# Patient Record
Sex: Female | Born: 1940
Health system: Southern US, Community
[De-identification: ages and names within clinical notes are randomized; demographics above are authoritative.]

## PROBLEM LIST (undated history)

## (undated) DIAGNOSIS — K5790 Diverticulosis of intestine, part unspecified, without perforation or abscess without bleeding: Secondary | ICD-10-CM

## (undated) DIAGNOSIS — K219 Gastro-esophageal reflux disease without esophagitis: Secondary | ICD-10-CM

## (undated) DIAGNOSIS — K120 Recurrent oral aphthae: Secondary | ICD-10-CM

## (undated) DIAGNOSIS — R413 Other amnesia: Secondary | ICD-10-CM

## (undated) DIAGNOSIS — N9489 Other specified conditions associated with female genital organs and menstrual cycle: Secondary | ICD-10-CM

## (undated) DIAGNOSIS — I4891 Unspecified atrial fibrillation: Secondary | ICD-10-CM

## (undated) DIAGNOSIS — R569 Unspecified convulsions: Secondary | ICD-10-CM

## (undated) HISTORY — PX: HERNIA REPAIR: SHX51

## (undated) HISTORY — PX: OTHER SURGICAL HISTORY: SHX169

## (undated) HISTORY — DX: Gastro-esophageal reflux disease without esophagitis: K21.9

## (undated) HISTORY — PX: CHOLECYSTECTOMY: SHX55

---

## 1998-05-12 ENCOUNTER — Other Ambulatory Visit: Admission: RE | Admit: 1998-05-12 | Discharge: 1998-05-12 | Payer: Self-pay | Admitting: *Deleted

## 2002-12-17 HISTORY — PX: HERNIA REPAIR: SHX51

## 2005-04-27 ENCOUNTER — Ambulatory Visit: Payer: Self-pay

## 2007-05-06 ENCOUNTER — Ambulatory Visit: Payer: Self-pay | Admitting: Family Medicine

## 2007-05-08 ENCOUNTER — Ambulatory Visit: Payer: Self-pay | Admitting: Gastroenterology

## 2008-06-05 ENCOUNTER — Encounter: Admission: RE | Admit: 2008-06-05 | Discharge: 2008-06-05 | Payer: Self-pay | Admitting: Neurology

## 2008-07-01 ENCOUNTER — Ambulatory Visit: Payer: Self-pay | Admitting: Family Medicine

## 2008-07-02 LAB — HM DEXA SCAN

## 2008-09-21 DIAGNOSIS — N9089 Other specified noninflammatory disorders of vulva and perineum: Secondary | ICD-10-CM | POA: Insufficient documentation

## 2009-06-08 ENCOUNTER — Ambulatory Visit: Payer: Self-pay | Admitting: Family Medicine

## 2010-01-04 ENCOUNTER — Ambulatory Visit: Payer: Self-pay

## 2010-08-16 ENCOUNTER — Ambulatory Visit: Payer: Self-pay | Admitting: Unknown Physician Specialty

## 2010-08-18 LAB — PATHOLOGY REPORT

## 2010-08-25 ENCOUNTER — Ambulatory Visit: Payer: Self-pay | Admitting: Unknown Physician Specialty

## 2012-07-22 ENCOUNTER — Ambulatory Visit: Payer: Self-pay | Admitting: Unknown Physician Specialty

## 2012-07-22 LAB — HM COLONOSCOPY

## 2012-07-25 ENCOUNTER — Ambulatory Visit: Payer: Self-pay | Admitting: Family Medicine

## 2012-07-30 ENCOUNTER — Ambulatory Visit: Payer: Self-pay | Admitting: Family Medicine

## 2013-02-13 ENCOUNTER — Ambulatory Visit: Payer: Self-pay | Admitting: Family Medicine

## 2014-07-25 LAB — HM PAP SMEAR

## 2014-10-28 ENCOUNTER — Ambulatory Visit: Payer: Self-pay | Admitting: Family Medicine

## 2015-03-16 LAB — BASIC METABOLIC PANEL
BUN: 14 mg/dL (ref 4–21)
Creatinine: 0.8 mg/dL (ref 0.5–1.1)
Glucose: 93 mg/dL
Potassium: 4.2 mmol/L (ref 3.4–5.3)
SODIUM: 141 mmol/L (ref 137–147)

## 2015-03-16 LAB — CBC AND DIFFERENTIAL
HEMATOCRIT: 46 % (ref 36–46)
Hemoglobin: 15.4 g/dL (ref 12.0–16.0)
Platelets: 229 10*3/uL (ref 150–399)
WBC: 5.2 10*3/mL

## 2015-03-16 LAB — HEPATIC FUNCTION PANEL
ALT: 20 U/L (ref 7–35)
AST: 17 U/L (ref 13–35)

## 2015-03-16 LAB — TSH: TSH: 2.42 u[IU]/mL (ref 0.41–5.90)

## 2015-03-17 ENCOUNTER — Ambulatory Visit
Admit: 2015-03-17 | Disposition: A | Discharge status: Home / Self Care | Payer: Self-pay | Attending: Family Medicine | Admitting: Family Medicine

## 2015-05-19 ENCOUNTER — Other Ambulatory Visit: Payer: Self-pay | Admitting: Family Medicine

## 2015-05-19 ENCOUNTER — Telehealth: Payer: Self-pay | Admitting: Family Medicine

## 2015-05-19 DIAGNOSIS — R239 Unspecified skin changes: Secondary | ICD-10-CM

## 2015-05-19 NOTE — Telephone Encounter (Signed)
Pt would like to get a referral to Derm. Pt has already set up appt for 05/20/15 @ 1pm with Dr. Maude Leriche on [facility]. Pt has places on her back that are crusty and itchy. Pt stated she is very concerned. Pt would like a call back for an update as soon as possible.

## 2015-05-23 NOTE — Progress Notes (Signed)
Sent to me in in error.

## 2015-05-28 DIAGNOSIS — K120 Recurrent oral aphthae: Secondary | ICD-10-CM | POA: Insufficient documentation

## 2015-05-28 DIAGNOSIS — R5383 Other fatigue: Secondary | ICD-10-CM | POA: Insufficient documentation

## 2015-05-28 DIAGNOSIS — R413 Other amnesia: Secondary | ICD-10-CM | POA: Insufficient documentation

## 2015-05-28 DIAGNOSIS — K5790 Diverticulosis of intestine, part unspecified, without perforation or abscess without bleeding: Secondary | ICD-10-CM | POA: Insufficient documentation

## 2015-05-28 DIAGNOSIS — K4090 Unilateral inguinal hernia, without obstruction or gangrene, not specified as recurrent: Secondary | ICD-10-CM | POA: Insufficient documentation

## 2015-05-28 DIAGNOSIS — N952 Postmenopausal atrophic vaginitis: Secondary | ICD-10-CM | POA: Insufficient documentation

## 2015-05-28 DIAGNOSIS — N9489 Other specified conditions associated with female genital organs and menstrual cycle: Secondary | ICD-10-CM | POA: Insufficient documentation

## 2015-05-31 ENCOUNTER — Other Ambulatory Visit: Payer: Self-pay

## 2015-05-31 DIAGNOSIS — G4700 Insomnia, unspecified: Secondary | ICD-10-CM

## 2015-05-31 MED ORDER — ZOLPIDEM TARTRATE 5 MG PO TABS: 5.0000 mg | ORAL_TABLET | Freq: Every day | ORAL | Status: DC

## 2015-05-31 NOTE — Telephone Encounter (Signed)
Printed. Please fax. Thanks.  

## 2015-08-08 ENCOUNTER — Encounter: Payer: Self-pay | Admitting: Family Medicine

## 2015-11-18 ENCOUNTER — Ambulatory Visit (INDEPENDENT_AMBULATORY_CARE_PROVIDER_SITE_OTHER): Payer: Commercial Managed Care - HMO | Admitting: Family Medicine

## 2015-11-18 ENCOUNTER — Encounter: Payer: Self-pay | Admitting: Family Medicine

## 2015-11-18 VITALS — BP 130/80 | HR 53 | Temp 98.8°F | Resp 165 | Wt 121.0 lb

## 2015-11-18 DIAGNOSIS — N3000 Acute cystitis without hematuria: Secondary | ICD-10-CM | POA: Diagnosis not present

## 2015-11-18 DIAGNOSIS — R300 Dysuria: Secondary | ICD-10-CM | POA: Diagnosis not present

## 2015-11-18 LAB — POCT URINALYSIS DIPSTICK
BILIRUBIN UA: NEGATIVE
Blood, UA: NEGATIVE
GLUCOSE UA: NEGATIVE
KETONES UA: NEGATIVE
Nitrite, UA: NEGATIVE
Protein, UA: NEGATIVE
SPEC GRAV UA: 1.02
Urobilinogen, UA: 0.2
pH, UA: 6

## 2015-11-18 MED ORDER — CIPROFLOXACIN HCL 500 MG PO TABS
500.0000 mg | ORAL_TABLET | Freq: Two times a day (BID) | ORAL | Status: DC
Start: 2015-11-18 — End: 2015-11-24

## 2015-11-18 NOTE — Progress Notes (Signed)
Patient: Alexis Lyons Female    DOB: Feb 18, 1941   74 y.o.   MRN: [id number] Visit Date: 11/18/2015  Today's Provider: Lelon Huh, MD   Chief Complaint  Patient presents with  . Dysuria    Burning   Subjective:    HPI  Patient started having stomach cramps 11/16/2015 in the evening with diarrhea and nausea. Then on 11/17/2015 she began having burning sensation when she urinated. Diarrhea and nausea have subsided. Burning is improved today, but not resolved. No fevers, no flank pain.     Allergies  Allergen Reactions  . Codeine    Previous Medications   DIPHENOXYLATE-ATROPINE (LOMOTIL) 2.5-0.025 MG PER TABLET    Take by mouth.   GINSENG 100 MG CAPS       MELOXICAM (MOBIC) 15 MG TABLET    Take by mouth.   OFLOXACIN (OCUFLOX) 0.3 % OPHTHALMIC SOLUTION    INSTILL 1 DROP INTO BOTH EYES TWICE A DAY FOR 7 DAYS AS NEEDED   OMEPRAZOLE (PRILOSEC) 40 MG CAPSULE    Take by mouth.   PANTOPRAZOLE (PROTONIX) 40 MG TABLET    Take by mouth.   ZOLPIDEM (AMBIEN) 5 MG TABLET    Take 1 tablet (5 mg total) by mouth at bedtime.    Review of Systems  Constitutional: Negative for fever, chills, appetite change and fatigue.  Respiratory: Negative for chest tightness and shortness of breath.   Cardiovascular: Negative for chest pain and palpitations.  Gastrointestinal: Negative for nausea, vomiting and abdominal pain.  Genitourinary:       Burning sensation when she urinates, started yesterday 11/17/2015  Neurological: Negative for dizziness and weakness.    Social History  Substance Use Topics  . Smoking status: Former Research scientist (life sciences)  . Smokeless tobacco: Not on file  . Alcohol Use: 0.0 oz/week    0 Standard drinks or equivalent per week   Objective:   BP 130/80 mmHg  Pulse 53  Temp(Src) 98.8 F (37.1 C) (Oral)  Resp 165  Wt 121 lb (54.885 kg)  SpO2 97%  Physical Exam   General Appearance:    Alert, cooperative, no distress  Eyes:    PERRL, conjunctiva/corneas clear, EOM's  intact       Lungs:     Clear to auscultation bilaterally, respirations unlabored  Heart:    Regular rate and rhythm  Neurologic:   Awake, alert, oriented x 3. No apparent focal neurological           defect.   GU:     No CVAT       Results for orders placed or performed in visit on 11/18/15  POCT urinalysis dipstick  Result Value Ref Range   Color, UA Yellow    Clarity, UA Clear    Glucose, UA Neg    Bilirubin, UA Neg    Ketones, UA Neg    Spec Grav, UA 1.020    Blood, UA Neg    pH, UA 6.0    Protein, UA Neg    Urobilinogen, UA 0.2    Nitrite, UA Neg    Leukocytes, UA Trace (A) Negative       Assessment & Plan:     1. Dysuria  - POCT urinalysis dipstick  2. Acute cystitis without hematuria Improving today. Counseled to push clear liquids. Start abx if symptoms worse or not completley resolved within 7 days.  - ciprofloxacin (CIPRO) 500 MG tablet; Take 1 tablet (500 mg total) by mouth 2 (two)  times daily.  Dispense: 14 tablet; Refill: 0       Lelon Huh, MD  [facility]

## 2015-11-24 ENCOUNTER — Ambulatory Visit (INDEPENDENT_AMBULATORY_CARE_PROVIDER_SITE_OTHER): Payer: Commercial Managed Care - HMO | Admitting: Family Medicine

## 2015-11-24 ENCOUNTER — Encounter: Payer: Self-pay | Admitting: Family Medicine

## 2015-11-24 VITALS — BP 120/82 | HR 60 | Temp 98.3°F | Resp 16 | Ht 63.5 in | Wt 121.0 lb

## 2015-11-24 DIAGNOSIS — Z23 Encounter for immunization: Secondary | ICD-10-CM | POA: Diagnosis not present

## 2015-11-24 DIAGNOSIS — Z1231 Encounter for screening mammogram for malignant neoplasm of breast: Secondary | ICD-10-CM

## 2015-11-24 DIAGNOSIS — Z0000 Encounter for general adult medical examination without abnormal findings: Secondary | ICD-10-CM | POA: Diagnosis not present

## 2015-11-24 DIAGNOSIS — N907 Vulvar cyst: Secondary | ICD-10-CM | POA: Diagnosis not present

## 2015-11-24 NOTE — Progress Notes (Signed)
Patient: Alexis Lyons, Female    DOB: 09-20-1941, 74 y.o.   MRN: [id number] Visit Date: 11/24/2015  Today's Provider: Margarita Rana, MD   Chief Complaint  Patient presents with  . Medicare Wellness  . Mass   Subjective:    Annual wellness visit Alexis Lyons is a 74 y.o. female. She feels well. She reports exercising daily; runs 3 miles. She reports she is sleeping well.  Last AWV- 08/04/2014 Last Pap- 08/04/2014 Last Mammo- 10/28/2014- BI-RADS 1 Last Colon- 07/22/2012- Diverticulosis. Recheck 5 years per Dr. Vira Agar Pneumovax 23- 01/01/2011  Labs checked at last AWV, as well as 03/16/2015. All labs were WNL.  -----------------------------------------------------------  Mass Located on groin area x 1 year. Pt denies pain, itching, change in size.   Review of Systems  Constitutional: Negative.   HENT: Negative.   Eyes: Negative.   Respiratory: Negative.   Cardiovascular: Negative.   Gastrointestinal: Negative.   Endocrine: Negative.   Genitourinary: Negative.   Musculoskeletal: Negative.   Skin: Negative for color change, pallor, rash and wound.       Mass is present  Allergic/Immunologic: Negative.   Neurological: Negative.   Hematological: Negative.   Psychiatric/Behavioral: Negative.     Social History   Social History  . Marital Status: Divorced    Spouse Name: N/A  . Number of Children: 2  . Years of Education: 15   Occupational History  . [employer] in [place] History Main Topics  . Smoking status: Former Smoker    Quit date: 12/16/1985  . Smokeless tobacco: Never Used  . Alcohol Use: 4.2 oz/week    0 Standard drinks or equivalent, 7 Glasses of wine per week     Comment: glass of wine with dinner  . Drug Use: No  . Sexual Activity: Not on file   Other Topics Concern  . Not on file   Social History Narrative    Patient Active Problem List   Diagnosis Date Noted  . Aphthae 05/28/2015  . DD (diverticular disease) 05/28/2015   . Fatigue 05/28/2015  . Hernia, inguinal, right 05/28/2015  . Amnesia 05/28/2015  . 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 Atrophy of vagina 05/28/2015  . Vaginal lesion 09/21/2008  . Acid reflux 06/15/2008  . Cannot sleep 12/17/2004    Past Surgical History  Procedure Laterality Date  . Hernia repair Right 2004    inguinal   . Cholecystectomy    . Hernia repair      x's 3    Her family history includes Alzheimer's disease in her mother; Cancer in her sister.    Previous Medications   GINSENG 100 MG CAPS       MELOXICAM (MOBIC) 15 MG TABLET    Take by mouth.   OFLOXACIN (OCUFLOX) 0.3 % OPHTHALMIC SOLUTION    INSTILL 1 DROP INTO BOTH EYES TWICE A DAY FOR 7 DAYS AS NEEDED   OMEPRAZOLE (PRILOSEC) 40 MG CAPSULE    Take by mouth.   PANTOPRAZOLE (PROTONIX) 40 MG TABLET    Take by mouth.   ZOLPIDEM (AMBIEN) 5 MG TABLET    Take 1 tablet (5 mg total) by mouth at bedtime.    Patient Care Team: Margarita Rana, MD as PCP - General (Family Medicine)     Objective:   Vitals: BP 120/82 mmHg  Pulse 60  Temp(Src) 98.3 F (36.8 C) (Oral)  Resp 16  Ht 5' 3.5" (1.613 m)  Wt 121 lb (54.885 kg)  BMI 21.10 kg/m2  Physical Exam  Constitutional: She is oriented to person, place, and time. She appears well-developed and well-nourished. No distress.  HENT:  Head: Normocephalic and atraumatic.  Right Ear: External ear normal.  Left Ear: External ear normal.  Nose: Nose normal.  Mouth/Throat: Oropharynx is clear and moist. No oropharyngeal exudate.  Eyes: Conjunctivae and EOM are normal. Pupils are equal, round, and reactive to light. Right eye exhibits no discharge. Left eye exhibits no discharge. No scleral icterus.  Neck: Normal range of motion. Neck supple. No JVD present. No tracheal deviation present. No thyromegaly present.  Cardiovascular: Normal rate, regular rhythm, normal heart sounds and intact distal pulses.  Exam reveals no gallop and no friction rub.   No murmur  heard. Pulmonary/Chest: Effort normal and breath sounds normal. No stridor. No respiratory distress. She has no wheezes. She has no rales. She exhibits no tenderness.  Abdominal: Soft. Bowel sounds are normal. She exhibits no distension and no mass. There is no tenderness. There is no rebound and no guarding.  Genitourinary: No breast swelling, tenderness, discharge or bleeding.  Musculoskeletal: Normal range of motion. She exhibits no edema or tenderness.  Lymphadenopathy:    She has no cervical adenopathy.  Neurological: She is alert and oriented to person, place, and time. She has normal reflexes. She displays normal reflexes. No cranial nerve deficit. She exhibits normal muscle tone. Coordination normal.  Skin: Skin is warm and dry. Lesion (inclusion cyst of vulva on left side) noted. No rash noted. She is not diaphoretic. No erythema. No pallor.  Psychiatric: She has a normal mood and affect. Her behavior is normal. Judgment and thought content normal.    Activities of Daily Living In your present state of health, do you have any difficulty performing the following activities: 11/24/2015  Hearing? N  Vision? Y  Difficulty concentrating or making decisions? N  Walking or climbing stairs? N  Dressing or bathing? N  Doing errands, shopping? N    Fall Risk Assessment Fall Risk  11/24/2015  Falls in the past year? No     Depression Screen PHQ 2/9 Scores 11/24/2015  PHQ - 2 Score 0    Cognitive Testing - 6-CIT  Correct? Score   What year is it? yes 0 0 or 4  What month is it? yes 0 0 or 3  Memorize:    Alexis Lyons,  43,  [place] [address],  [place],      What time is it? (within 1 hour) yes 0 0 or 3  Count backwards from 20 yes 0 0, 2, or 4  Name the months of the year yes 0 0, 2, or 4  Repeat name & address above yes 0 0, 2, 4, 6, 8, or 10       TOTAL SCORE  0/28   Interpretation:  Normal  Normal (0-7) Abnormal (8-28)       Assessment & Plan:     Annual Wellness  Visit  Reviewed patient's Family Medical History Reviewed and updated list of patient's medical providers Assessment of cognitive impairment was done Assessed patient's functional ability Established a written schedule for health screening [place] Completed and Reviewed  Exercise Activities and Dietary recommendations Goals    None      Immunization History  Administered Date(s) Administered  . Hepatitis A 07/26/2015  . Pneumococcal Polysaccharide-23 01/01/2011  . Td 06/23/2008  . Typhoid Inactivated 07/26/2015    Health Maintenance  Topic Date Due  . MAMMOGRAM  05/12/1992  .  COLONOSCOPY  05/12/1992  . ZOSTAVAX  05/12/2002  . DEXA SCAN  05/13/2007  . PNA vac Low Risk Adult (2 of 2 - PCV13) 01/02/2012  . INFLUENZA VACCINE  03/16/2016 (Originally 07/18/2015)  . TETANUS/TDAP  06/23/2018      Discussed health benefits of physical activity, and encouraged her to engage in regular exercise appropriate for her age and condition.    ------------------------------------------------------------------------------------------------------------  1. Medicare annual wellness visit, subsequent Stable. As above.  2. Need for pneumococcal vaccination Pt declines Prevnar today. Encouraged pt think about getting this vaccine.  3. Flu vaccine need Pt declines flu vaccine today.  4. Inclusion cyst of vulva Stable. Not worrisome at this time.  5. Encounter for screening mammogram for breast cancer FU pending results. - MM DIGITAL SCREENING BILATERAL; Future  Patient seen and examined by Jerrell Belfast, MD, and note scribed by Renaldo Fiddler, CMA.  I have reviewed the document for accuracy and completeness and I agree with above. Jerrell Belfast, MD   Margarita Rana, MD

## 2016-04-19 ENCOUNTER — Encounter: Payer: Self-pay | Admitting: Family Medicine

## 2016-04-19 ENCOUNTER — Ambulatory Visit (INDEPENDENT_AMBULATORY_CARE_PROVIDER_SITE_OTHER): Payer: PPO | Admitting: Family Medicine

## 2016-04-19 VITALS — BP 132/86 | HR 64 | Temp 97.7°F | Resp 16 | Wt 124.0 lb

## 2016-04-19 DIAGNOSIS — K529 Noninfective gastroenteritis and colitis, unspecified: Secondary | ICD-10-CM

## 2016-04-19 MED ORDER — SUCRALFATE 1 G PO TABS: 1.0000 g | ORAL_TABLET | Freq: Three times a day (TID) | ORAL | Status: DC

## 2016-04-19 NOTE — Patient Instructions (Signed)
Discussed use of imodium if diarrhea returns and Gaviscon for stomach discomfort.

## 2016-04-19 NOTE — Progress Notes (Signed)
Subjective:     Patient ID: Alexis Lyons, female   DOB: 1941-09-25, 75 y.o.   MRN: [id number]  HPI  Chief Complaint  Patient presents with  . Diarrhea    Patient comes in office today with concerns of diarrhea for three days. Associated patient complains of abdominal pain and soreness, bloating and nause. Patient denies taking any otc medication for relief.  States diarrhea has abated and she is left with mild stomach discomfort. She reports her sx started after eating chicken salad at [employer]. States her normal bowel pattern is daily and she was moving her bowels prior to the onset of her sx. States she is leaving for a [place] resort this AM and wishes a medication to soothe her stomach. Hx of diverticulosis.   Review of Systems  Gastrointestinal: Negative for vomiting.       Objective:   Physical Exam  Constitutional: She appears well-developed and well-nourished. No distress.  Abdominal: Soft. Bowel sounds are normal. She exhibits no distension. There is no tenderness. Guarding: involuntary guarding in the right upper quadrant.       Assessment:    1. Gastroenteritis - sucralfate (CARAFATE) 1 g tablet; Take 1 tablet (1 g total) by mouth 4 (four) times daily -  with meals and at bedtime.  Dispense: 12 tablet; Refill: 0    Plan:    Discussed taking imodium with her and using Gaviscon if Carafate not available at the pharmacy in a timely fashion.

## 2016-05-18 ENCOUNTER — Other Ambulatory Visit: Payer: Self-pay | Admitting: Family Medicine

## 2016-05-21 NOTE — Telephone Encounter (Signed)
Printed, please fax or call in to pharmacy. Thank you.   

## 2016-05-23 ENCOUNTER — Other Ambulatory Visit: Payer: Self-pay | Admitting: Family Medicine

## 2016-05-23 DIAGNOSIS — K219 Gastro-esophageal reflux disease without esophagitis: Secondary | ICD-10-CM

## 2016-05-23 NOTE — Telephone Encounter (Signed)
Had CPE 11/24/2015. Alexis Lyons, CMA

## 2016-06-01 ENCOUNTER — Encounter: Payer: Self-pay | Admitting: Family Medicine

## 2016-06-01 ENCOUNTER — Ambulatory Visit: Payer: PPO | Admitting: Family Medicine

## 2016-06-01 ENCOUNTER — Ambulatory Visit (INDEPENDENT_AMBULATORY_CARE_PROVIDER_SITE_OTHER): Payer: PPO | Admitting: Family Medicine

## 2016-06-01 VITALS — BP 132/82 | HR 64 | Temp 98.0°F | Resp 16 | Wt 124.0 lb

## 2016-06-01 DIAGNOSIS — K2950 Unspecified chronic gastritis without bleeding: Secondary | ICD-10-CM

## 2016-06-01 MED ORDER — SUCRALFATE 1 G PO TABS: 1.0000 g | ORAL_TABLET | Freq: Three times a day (TID) | ORAL | Status: DC

## 2016-06-01 NOTE — Progress Notes (Signed)
Subjective:    Patient ID: Alexis Lyons, female    DOB: 14-Apr-1941, 75 y.o.   MRN: [id number]  HPI  Abdominal Pain: Patient complains of abdominal pain. The pain is described as "soreness", and is 5/10 in intensity. Pain is located in the epigastric without radiation. Onset was 2 months ago. Symptoms have been waxing and waning since. Aggravating factors: none.  Alleviating factors: pt has tried Carafate and Protonix, with only short-term relief. Associated symptoms: belching, diarrhea, flatus and bloating. The patient denies anorexia, constipation, dysuria, fever, nausea and vomiting. Pt saw [facility] in 2011 for this problem. H. Pylori negative at that time. Had upper endoscopy done. Fundic gland polyp. Negative for dysplasia. Did show mild chronic gastritis. Pt also had CT abdomen done 03/17/2015. Suggested Pelvic Congestion Syndrome.    Review of Systems  Constitutional: Negative for fever, chills, activity change, appetite change, fatigue and unexpected weight change.  Gastrointestinal: Positive for abdominal pain, diarrhea and abdominal distention. Negative for nausea, vomiting, constipation and blood in stool.   BP 132/82 mmHg  Pulse 64  Temp(Src) 98 F (36.7 C) (Oral)  Resp 16  Wt 124 lb (56.246 kg)   Patient Active Problem List   Diagnosis Date Noted  . Aphthae 05/28/2015  . DD (diverticular disease) 05/28/2015  . Fatigue 05/28/2015  . Hernia, inguinal, right 05/28/2015  . Amnesia 05/28/2015  . 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 Atrophy of vagina 05/28/2015  . Vaginal lesion 09/21/2008  . Acid reflux 06/15/2008  . Cannot sleep 12/17/2004   Past Medical History  Diagnosis Date  . GERD (gastroesophageal reflux disease)    Current Outpatient Prescriptions on File Prior to Visit  Medication Sig  . Ginseng 100 MG CAPS   . meloxicam (MOBIC) 15 MG tablet Take by mouth.  . pantoprazole (PROTONIX) 40 MG tablet TAKE 1 TABLET BY MOUTH ONCE DAILY.  Marland Kitchen zolpidem  (AMBIEN) 5 MG tablet TAKE 1 TABLET BY MOUTH AT BEDTIME.   No current facility-administered medications on file prior to visit.   Allergies  Allergen Reactions  . Codeine    Past Surgical History  Procedure Laterality Date  . Hernia repair Right 2004    inguinal   . Cholecystectomy    . Hernia repair      x's 3   Social History   Social History  . Marital Status: Divorced    Spouse Name: N/A  . Number of Children: 2  . Years of Education: 15   Occupational History  . [employer] in [place] History Main Topics  . Smoking status: Former Smoker    Quit date: 12/16/1985  . Smokeless tobacco: Never Used  . Alcohol Use: 4.2 oz/week    0 Standard drinks or equivalent, 7 Glasses of wine per week     Comment: glass of wine with dinner  . Drug Use: No  . Sexual Activity: Not on file   Other Topics Concern  . Not on file   Social History Narrative   Family History  Problem Relation Age of Onset  . Alzheimer's disease Mother   . Cancer Sister     breast      Objective:   Physical Exam  Constitutional: She is oriented to person, place, and time. She appears well-developed and well-nourished.  Cardiovascular: Normal rate, regular rhythm and normal heart sounds.   Pulmonary/Chest: Effort normal and breath sounds normal. No respiratory distress.  Abdominal: Soft. Bowel sounds are normal. She exhibits distension. There is tenderness (  epigastric).  Neurological: She is alert and oriented to person, place, and time.  Psychiatric: She has a normal mood and affect. Her behavior is normal.  Vitals reviewed. BP 132/82 mmHg  Pulse 64  Temp(Src) 98 F (36.7 C) (Oral)  Resp 16  Wt 124 lb (56.246 kg)     Assessment & Plan:  1. Chronic gastritis Recurrent. Restart Carafate as below. Will refer back to GI for evaluation. - sucralfate (CARAFATE) 1 g tablet; Take 1 tablet (1 g total) by mouth 4 (four) times daily -  with meals and at bedtime.  Dispense: 28 tablet; Refill:  0 - Ambulatory referral to Gastroenterology    Patient seen and examined by Jerrell Belfast, MD, and note scribed by Renaldo Fiddler, CMA.   I have reviewed the document for accuracy and completeness and I agree with above. Jerrell Belfast, MD   Margarita Rana, MD

## 2016-06-14 ENCOUNTER — Encounter: Payer: Self-pay | Admitting: Family Medicine

## 2016-06-14 ENCOUNTER — Ambulatory Visit (INDEPENDENT_AMBULATORY_CARE_PROVIDER_SITE_OTHER): Payer: PPO | Admitting: Family Medicine

## 2016-06-14 VITALS — BP 124/64 | HR 68 | Temp 98.1°F | Resp 16 | Ht 63.5 in | Wt 123.0 lb

## 2016-06-14 DIAGNOSIS — T148 Other injury of unspecified body region: Secondary | ICD-10-CM

## 2016-06-14 DIAGNOSIS — W57XXXA Bitten or stung by nonvenomous insect and other nonvenomous arthropods, initial encounter: Secondary | ICD-10-CM

## 2016-06-14 MED ORDER — TRIAMCINOLONE ACETONIDE 0.1 % EX CREA: 1.0000 "application " | TOPICAL_CREAM | Freq: Three times a day (TID) | CUTANEOUS | Status: DC

## 2016-06-14 NOTE — Progress Notes (Signed)
Subjective:     Patient ID: Alexis Lyons, female   DOB: [birth year], 75 y.o.   MRN: [id number]  HPI  Chief Complaint  Patient presents with  . Rash  States she was working in her yard prior to developing itchy bumps on her legs. Denies feeling a sting or seeing any ticks. Has applied alcohol and a zinc preparation. Reports bump on her inner thigh is tender and itchy.   Review of Systems     Objective:   Physical Exam  Constitutional: She appears well-developed and well-nourished. No distress.  Skin:  3 insect bites noted on her lower extremities. Right inner thigh appear to have central vesicle and is mildly tender to the touch. No underlying induration but has significant inflammatory flare.       Assessment:    1. Insect bites - triamcinolone cream (KENALOG) 0.1 %; Apply 1 application topically 3 (three) times daily. To insect bites.  Dispense: 30 g; Refill: 0    Plan:    Apply antibiotic cream with steroid cream to inner thigh bite. Call if not improving.

## 2016-06-14 NOTE — Patient Instructions (Signed)
Please mix antibiotic cream with the steroid cream and apply to the tender bite on your right thigh 3 x day.

## 2016-07-03 DIAGNOSIS — R1013 Epigastric pain: Secondary | ICD-10-CM | POA: Diagnosis not present

## 2016-07-03 DIAGNOSIS — Z8719 Personal history of other diseases of the digestive system: Secondary | ICD-10-CM | POA: Diagnosis not present

## 2016-08-03 ENCOUNTER — Ambulatory Visit: Admission: RE | Admit: 2016-08-03 | Payer: PPO | Source: Ambulatory Visit | Admitting: Unknown Physician Specialty

## 2016-08-03 ENCOUNTER — Encounter: Admission: RE | Payer: Self-pay | Source: Ambulatory Visit

## 2016-08-03 SURGERY — ESOPHAGOGASTRODUODENOSCOPY (EGD) WITH PROPOFOL
Anesthesia: General

## 2016-08-28 DIAGNOSIS — H04123 Dry eye syndrome of bilateral lacrimal glands: Secondary | ICD-10-CM | POA: Diagnosis not present

## 2016-12-28 DIAGNOSIS — L608 Other nail disorders: Secondary | ICD-10-CM | POA: Diagnosis not present

## 2016-12-28 DIAGNOSIS — L853 Xerosis cutis: Secondary | ICD-10-CM | POA: Diagnosis not present

## 2016-12-28 DIAGNOSIS — D2271 Melanocytic nevi of right lower limb, including hip: Secondary | ICD-10-CM | POA: Diagnosis not present

## 2016-12-28 DIAGNOSIS — D225 Melanocytic nevi of trunk: Secondary | ICD-10-CM | POA: Diagnosis not present

## 2017-01-08 ENCOUNTER — Encounter: Payer: Self-pay | Admitting: Physician Assistant

## 2017-01-08 ENCOUNTER — Ambulatory Visit (INDEPENDENT_AMBULATORY_CARE_PROVIDER_SITE_OTHER): Payer: PPO | Admitting: Physician Assistant

## 2017-01-08 VITALS — BP 120/82 | HR 64 | Temp 98.1°F | Resp 16 | Wt 120.0 lb

## 2017-01-08 DIAGNOSIS — A084 Viral intestinal infection, unspecified: Secondary | ICD-10-CM | POA: Diagnosis not present

## 2017-01-08 MED ORDER — PROMETHAZINE HCL 12.5 MG PO TABS: 12.5000 mg | ORAL_TABLET | Freq: Four times a day (QID) | ORAL | 0 refills | Status: DC | PRN

## 2017-01-08 NOTE — Patient Instructions (Signed)
Viral Gastroenteritis, Adult Introduction Viral gastroenteritis is also known as the stomach flu. This condition is caused by certain germs (viruses). These germs can be passed from person to person very easily (are very contagious). This condition can cause sudden watery poop (diarrhea), fever, and throwing up (vomiting). Having watery poop and throwing up can make you feel weak and cause you to get dehydrated. Dehydration can make you tired and thirsty, make you have a dry mouth, and make it so you pee (urinate) less often. Older adults and people with other diseases or a weak defense system (immune system) are at higher risk for dehydration. It is important to replace the fluids that you lose from having watery poop and throwing up. Follow these instructions at home: Follow instructions from your doctor about how to care for yourself at home. Eating and drinking Follow these instructions as told by your doctor:  Take an oral rehydration solution (ORS). This is a drink that is sold at pharmacies and stores.  Drink clear fluids in small amounts as you are able, such as:  Water.  Ice chips.  Diluted fruit juice.  Low-calorie sports drinks.  Eat bland, easy-to-digest foods in small amounts as you are able, such as:  Bananas.  Applesauce.  Rice.  Low-fat (lean) meats.  Toast.  Crackers.  Avoid fluids that have a lot of sugar or caffeine in them.  Avoid alcohol.  Avoid spicy or fatty foods. General instructions  Drink enough fluid to keep your pee (urine) clear or pale yellow.  Wash your hands often. If you cannot use soap and water, use hand sanitizer.  Make sure that all people in your home wash their hands well and often.  Rest at home while you get better.  Take over-the-counter and prescription medicines only as told by your doctor.  Watch your condition for any changes.  Take a warm bath to help with any burning or pain from having watery poop.  Keep all  follow-up visits as told by your doctor. This is important. Contact a doctor if:  You cannot keep fluids down.  Your symptoms get worse.  You have new symptoms.  You feel light-headed or dizzy.  You have muscle cramps. Get help right away if:  You have chest pain.  You feel very weak or you pass out (faint).  You see blood in your throw-up.  Your throw-up looks like coffee grounds.  You have bloody or black poop (stools) or poop that look like tar.  You have a very bad headache, a stiff neck, or both.  You have a rash.  You have very bad pain, cramping, or bloating in your belly (abdomen).  You have trouble breathing.  You are breathing very quickly.  Your heart is beating very quickly.  Your skin feels cold and clammy.  You feel confused.  You have pain when you pee.  You have signs of dehydration, such as:  Dark pee, hardly any pee, or no pee.  Cracked lips.  Dry mouth.  Sunken eyes.  Sleepiness.  Weakness. This information is not intended to replace advice given to you by your health care provider. Make sure you discuss any questions you have with your health care provider. Document Released: 05/21/2008 Document Revised: 06/22/2016 Document Reviewed: 08/09/2015  2017 [employer]vier  

## 2017-01-08 NOTE — Progress Notes (Signed)
Patient: Alexis Lyons Female    DOB: [birth year]   76 y.o.   MRN: [id number] Visit Date: 01/08/2017  Today's Provider: Trinna Post, PA-C   Chief Complaint  Patient presents with  . Diarrhea    Started last night.    Subjective:    Diarrhea   This is a new problem. The current episode started yesterday. The problem occurs 2 to 4 times per day. The problem has been unchanged. The patient states that diarrhea does not awaken her from sleep. Associated symptoms include abdominal pain, bloating, chills, headaches, increased flatus and myalgias. Pertinent negatives include no fever or vomiting. There are no known risk factors. She has tried nothing for the symptoms.       Allergies  Allergen Reactions  . Codeine      Current Outpatient Prescriptions:  .  Ginseng 100 MG CAPS, , Disp: , Rfl:  .  meloxicam (MOBIC) 15 MG tablet, Take by mouth., Disp: , Rfl:  .  pantoprazole (PROTONIX) 40 MG tablet, TAKE 1 TABLET BY MOUTH ONCE DAILY., Disp: 30 tablet, Rfl: 5 .  zolpidem (AMBIEN) 5 MG tablet, TAKE 1 TABLET BY MOUTH AT BEDTIME., Disp: 30 tablet, Rfl: 3  Review of Systems  Constitutional: Positive for appetite change, chills and fatigue. Negative for activity change, diaphoresis, fever and unexpected weight change.  HENT: Negative.   Respiratory: Negative.   Gastrointestinal: Positive for abdominal distention, abdominal pain, bloating, diarrhea, flatus and nausea. Negative for anal bleeding, blood in stool, constipation, rectal pain and vomiting.  Musculoskeletal: Positive for myalgias.  Neurological: Positive for headaches. Negative for dizziness and light-headedness.    Social History  Substance Use Topics  . Smoking status: Former Smoker    Quit date: 12/16/1985  . Smokeless tobacco: Never Used  . Alcohol use 4.2 oz/week    7 Glasses of wine per week     Comment: glass of wine with dinner   Objective:   BP 120/82 (BP Location: Left Arm, Patient Position:  Sitting, Cuff Size: Normal)   Pulse 64   Temp 98.1 F (36.7 C) (Oral)   Resp 16   Wt 120 lb (54.4 kg)   BMI 20.92 kg/m   Physical Exam  Constitutional: She appears well-developed and well-nourished. She does not appear ill. No distress.  HENT:  Mouth/Throat: Mucous membranes are normal.  Cardiovascular: Normal rate and regular rhythm.   Pulmonary/Chest: Effort normal and breath sounds normal.  Abdominal: Soft. Bowel sounds are normal. She exhibits no distension and no mass. There is no tenderness. There is no rebound and no guarding.  Skin: Skin is warm and dry.  Vitals reviewed.       Assessment & Plan:     1. Viral gastroenteritis  Patient presenting with s/sx consistent with viral gastroenteritis. Have prescribed nausea meds as below if she needs, counseled on sedation risk. May take immodium for diarrhea if it remains non bloody. Push fluids. She can try Zantac 150 mg BID x 1 month for belching. Tylenol for muscle aches.  - promethazine (PHENERGAN) 12.5 MG tablet; Take 1 tablet (12.5 mg total) by mouth every 6 (six) hours as needed for nausea or vomiting.  Dispense: 30 tablet; Refill: 0   Patient Instructions  Viral Gastroenteritis, Adult Introduction Viral gastroenteritis is also known as the stomach flu. This condition is caused by certain germs (viruses). These germs can be passed from person to person very easily (are very contagious). This condition can cause sudden  watery poop (diarrhea), fever, and throwing up (vomiting). Having watery poop and throwing up can make you feel weak and cause you to get dehydrated. Dehydration can make you tired and thirsty, make you have a dry mouth, and make it so you pee (urinate) less often. Older adults and people with other diseases or a weak defense system (immune system) are at higher risk for dehydration. It is important to replace the fluids that you lose from having watery poop and throwing up. Follow these instructions at  home: Follow instructions from your doctor about how to care for yourself at home. Eating and drinking Follow these instructions as told by your doctor:  Take an oral rehydration solution (ORS). This is a drink that is sold at pharmacies and stores.  Drink clear fluids in small amounts as you are able, such as:  Water.  Ice chips.  Diluted fruit juice.  Low-calorie sports drinks.  Eat bland, easy-to-digest foods in small amounts as you are able, such as:  Bananas.  Applesauce.  Rice.  Low-fat (lean) meats.  Toast.  Crackers.  Avoid fluids that have a lot of sugar or caffeine in them.  Avoid alcohol.  Avoid spicy or fatty foods. General instructions  Drink enough fluid to keep your pee (urine) clear or pale yellow.  Wash your hands often. If you cannot use soap and water, use hand sanitizer.  Make sure that all people in your home wash their hands well and often.  Rest at home while you get better.  Take over-the-counter and prescription medicines only as told by your doctor.  Watch your condition for any changes.  Take a warm bath to help with any burning or pain from having watery poop.  Keep all follow-up visits as told by your doctor. This is important. Contact a doctor if:  You cannot keep fluids down.  Your symptoms get worse.  You have new symptoms.  You feel light-headed or dizzy.  You have muscle cramps. Get help right away if:  You have chest pain.  You feel very weak or you pass out (faint).  You see blood in your throw-up.  Your throw-up looks like coffee grounds.  You have bloody or black poop (stools) or poop that look like tar.  You have a very bad headache, a stiff neck, or both.  You have a rash.  You have very bad pain, cramping, or bloating in your belly (abdomen).  You have trouble breathing.  You are breathing very quickly.  Your heart is beating very quickly.  Your skin feels cold and clammy.  You feel  confused.  You have pain when you pee.  You have signs of dehydration, such as:  Dark pee, hardly any pee, or no pee.  Cracked lips.  Dry mouth.  Sunken eyes.  Sleepiness.  Weakness. This information is not intended to replace advice given to you by your health care provider. Make sure you discuss any questions you have with your health care provider. Document Released: 05/21/2008 Document Revised: 06/22/2016 Document Reviewed: 08/09/2015  2017 Elsevier   Return if symptoms worsen or fail to improve.  The entirety of the information documented in the History of Present Illness, Review of Systems and Physical Exam were personally obtained by me. Portions of this information were initially documented by Ashley Royalty, CMA and reviewed by me for thoroughness and accuracy.         Trinna Post, PA-C  [facility]

## 2017-01-09 ENCOUNTER — Other Ambulatory Visit: Payer: Self-pay | Admitting: Family Medicine

## 2017-01-10 NOTE — Telephone Encounter (Signed)
Done. Prescription phoned into pharmacy.

## 2017-01-10 NOTE — Telephone Encounter (Signed)
Please call in zolpidem  

## 2017-01-10 NOTE — Telephone Encounter (Signed)
Jenni's pt.   Thanks,   -Merrill Deanda  

## 2017-01-15 ENCOUNTER — Telehealth: Payer: Self-pay | Admitting: Family Medicine

## 2017-01-15 DIAGNOSIS — K219 Gastro-esophageal reflux disease without esophagitis: Secondary | ICD-10-CM

## 2017-01-15 NOTE — Telephone Encounter (Signed)
Pt called states the pharmacy has sent a prior auth for zolpidem (AMBIEN) 5 MG tablet.  Pt is requesting a call back to discuss.  CB#[phone]/[id number]

## 2017-01-16 MED ORDER — OMEPRAZOLE 20 MG PO CPDR: 20.0000 mg | DELAYED_RELEASE_CAPSULE | Freq: Every day | ORAL | 0 refills | Status: DC

## 2017-01-16 NOTE — Telephone Encounter (Signed)
LMTCB

## 2017-01-16 NOTE — Telephone Encounter (Signed)
Patient just wanted to make [place] aware Zolpidem was approved.   Patient is also requesting a RX for omeprazole be sent to [facility]. She was taking this medication prior to Protonix and feels it was helping more than the Protonix.

## 2017-01-16 NOTE — Telephone Encounter (Signed)
Didn't go into depth about issue when I saw this patient as she was here for acute visit, but upon chart review seems like patient has history of chronic gastritis for which she has seen Arbie Cookey, refractory multiple medications. Cancelled her EGD last year. Patient is due for colonoscopy in 07/2017 and if she is having recurrence of symptoms, I recommend she contact the GI clinic and see if they can schedule an EGD along with her colonoscopy at that point. Will give 3 mo omeprazole.

## 2017-01-17 ENCOUNTER — Encounter: Payer: Self-pay | Admitting: Physician Assistant

## 2017-01-17 ENCOUNTER — Ambulatory Visit (INDEPENDENT_AMBULATORY_CARE_PROVIDER_SITE_OTHER): Payer: PPO | Admitting: Physician Assistant

## 2017-01-17 VITALS — BP 102/64 | HR 64 | Temp 97.9°F | Resp 16 | Wt 124.0 lb

## 2017-01-17 DIAGNOSIS — K219 Gastro-esophageal reflux disease without esophagitis: Secondary | ICD-10-CM

## 2017-01-17 MED ORDER — OMEPRAZOLE-SODIUM BICARBONATE 20-1100 MG PO CAPS: 1.0000 | ORAL_CAPSULE | Freq: Every day | ORAL | 0 refills | Status: DC

## 2017-01-17 NOTE — Patient Instructions (Signed)
Food Choices for Gastroesophageal Reflux Disease, Adult When you have gastroesophageal reflux disease (GERD), the foods you eat and your eating habits are very important. Choosing the right foods can help ease your discomfort. What guidelines do I need to follow?  Choose fruits, vegetables, whole grains, and low-fat dairy products.  Choose low-fat meat, fish, and poultry.  Limit fats such as oils, salad dressings, butter, nuts, and avocado.  Keep a food diary. This helps you identify foods that cause symptoms.  Avoid foods that cause symptoms. These may be different for everyone.  Eat small meals often instead of 3 large meals a day.  Eat your meals slowly, in a place where you are relaxed.  Limit fried foods.  Cook foods using methods other than frying.  Avoid drinking alcohol.  Avoid drinking large amounts of liquids with your meals.  Avoid bending over or lying down until 2-3 hours after eating. What foods are not recommended? These are some foods and drinks that may make your symptoms worse: Vegetables  Tomatoes. Tomato juice. Tomato and spaghetti sauce. Chili peppers. Onion and garlic. Horseradish. Fruits  Oranges, grapefruit, and lemon (fruit and juice). Meats  High-fat meats, fish, and poultry. This includes hot dogs, ribs, ham, sausage, salami, and bacon. Dairy  Whole milk and chocolate milk. Sour cream. Cream. Butter. Ice cream. Cream cheese. Drinks  Coffee and tea. Bubbly (carbonated) drinks or energy drinks. Condiments  Hot sauce. Barbecue sauce. Sweets/Desserts  Chocolate and cocoa. Donuts. Peppermint and spearmint. Fats and Oils  High-fat foods. This includes French fries and potato chips. Other  Vinegar. Strong spices. This includes black pepper, white pepper, red pepper, cayenne, curry powder, cloves, ginger, and chili powder. The items listed above may not be a complete list of foods and drinks to avoid. Contact your dietitian for more information.    This information is not intended to replace advice given to you by your health care provider. Make sure you discuss any questions you have with your health care provider. Document Released: 06/03/2012 Document Revised: 05/10/2016 Document Reviewed: 10/07/2013 Elsevier Interactive Patient Education  2017 Elsevier Inc.  

## 2017-01-17 NOTE — Progress Notes (Signed)
Patient: Alexis Lyons Female    DOB: 1941-01-13   76 y.o.   MRN: [id number] Visit Date: 01/17/2017  Today's Provider: Trinna Post, PA-C   Chief Complaint  Patient presents with  . Bloated   Subjective:    Abdominal Pain  This is a recurrent problem. The current episode started in the past 7 days. Associated symptoms include belching. Pertinent negatives include no constipation, diarrhea, headaches, nausea or vomiting. Treatments tried: Zegerid OTC. The treatment provided significant relief.   Pt is 76 y/o woman with history of chronic gastritis on 2011 EGD and polpys on 2011 colonoscopy with repeat normal colonoscopy in 2013. Has been evaluated by [facility] for chronic abdominal pain 2/2 acid reflux and has tried many antacids including 40 mg omeprazole along with 150 mg Zantac at night and sucralfate. Appears to be refractory to multiple medications. Had EGD scheduled in 07/2016 that she cancelled because she felt better. Also due for colonoscopy in 07/2017. She would like a prescription for Zegerid so her insurance will pay for it. She is having acid reflux symptoms, and the pharmacist recommended this. She drinks two cups of coffee a day, not very strict about avoid triggers. No melena or hematochezia, changes in bowel movements.    Allergies  Allergen Reactions  . Codeine      Current Outpatient Prescriptions:  .  Ginseng 100 MG CAPS, , Disp: , Rfl:  .  Omeprazole-Sodium Bicarbonate (ZEGERID) 20-1100 MG CAPS capsule, Take 1 capsule by mouth daily before breakfast., Disp: , Rfl:  .  promethazine (PHENERGAN) 12.5 MG tablet, Take 1 tablet (12.5 mg total) by mouth every 6 (six) hours as needed for nausea or vomiting., Disp: 30 tablet, Rfl: 0 .  zolpidem (AMBIEN) 5 MG tablet, TAKE 1 TABLET BY MOUTH AT BEDTIME, Disp: 30 tablet, Rfl: 3 .  meloxicam (MOBIC) 15 MG tablet, Take by mouth., Disp: , Rfl:   Review of Systems  Constitutional: Negative.   Gastrointestinal: Positive  for abdominal distention. Negative for abdominal pain, anal bleeding, blood in stool, constipation, diarrhea, nausea, rectal pain and vomiting.  Neurological: Negative for dizziness, light-headedness and headaches.    Social History  Substance Use Topics  . Smoking status: Former Smoker    Quit date: 12/16/1985  . Smokeless tobacco: Never Used  . Alcohol use 4.2 oz/week    7 Glasses of wine per week     Comment: glass of wine with dinner   Objective:   BP 102/64 (BP Location: Left Arm, Patient Position: Sitting, Cuff Size: Normal)   Pulse 64   Temp 97.9 F (36.6 C) (Oral)   Resp 16   Wt 124 lb (56.2 kg)   BMI 21.62 kg/m   Physical Exam  Constitutional: She appears well-developed and well-nourished.  Cardiovascular: Normal rate.   Pulmonary/Chest: Effort normal and breath sounds normal.  Abdominal: Soft. Bowel sounds are normal. She exhibits no distension and no mass. There is no tenderness. There is no rebound and no guarding.  Skin: Skin is warm and dry.        Assessment & Plan:     1. Gastroesophageal reflux disease without esophagitis  Discussed at length patient's history and recommendations for her followups. Have reviewed with her the findings of her 2011 and 2013 colonoscopies as well as the 5 year follow up recommendation from the GI clinic. Have also discussed with her that systemic response to antacids does not preclude precancerous esophageal changes from GERD, and  that if her symptoms are persistent, even if they are intermittent, it may be in her best interest to call [facility] and see if she may schedule her colonoscopy and EGD at the same time. Have also discussed longterm adverse effects of this medication, especially relating to the sodium bicarbonate component, especially B12 deficiency and systemic alkalosis. For this reason, only willing to give four week supply. She accepts these risks. Discussed avoidance of dietary triggers. She may follow up with  Gi for further management of this.  - Omeprazole-Sodium Bicarbonate (ZEGERID) 20-1100 MG CAPS capsule; Take 1 capsule by mouth daily before breakfast.  Dispense: 28 each; Refill: 0  Return if symptoms worsen or fail to improve.    Patient Instructions  Food Choices for Gastroesophageal Reflux Disease, Adult When you have gastroesophageal reflux disease (GERD), the foods you eat and your eating habits are very important. Choosing the right foods can help ease your discomfort. What guidelines do I need to follow?  Choose fruits, vegetables, whole grains, and low-fat dairy products.  Choose low-fat meat, fish, and poultry.  Limit fats such as oils, salad dressings, butter, nuts, and avocado.  Keep a food diary. This helps you identify foods that cause symptoms.  Avoid foods that cause symptoms. These may be different for everyone.  Eat small meals often instead of 3 large meals a day.  Eat your meals slowly, in a place where you are relaxed.  Limit fried foods.  Cook foods using methods other than frying.  Avoid drinking alcohol.  Avoid drinking large amounts of liquids with your meals.  Avoid bending over or lying down until 2-3 hours after eating. What foods are not recommended? These are some foods and drinks that may make your symptoms worse: Vegetables  Tomatoes. Tomato juice. Tomato and spaghetti sauce. Chili peppers. Onion and garlic. Horseradish. Fruits  Oranges, grapefruit, and lemon (fruit and juice). Meats  High-fat meats, fish, and poultry. This includes hot dogs, ribs, ham, sausage, salami, and bacon. Dairy  Whole milk and chocolate milk. Sour cream. Cream. Butter. Ice cream. Cream cheese. Drinks  Coffee and tea. Bubbly (carbonated) drinks or energy drinks. Condiments  Hot sauce. Barbecue sauce. Sweets/Desserts  Chocolate and cocoa. Donuts. Peppermint and spearmint. Fats and Oils  High-fat foods. This includes [place] fries and potato chips. Other    Vinegar. Strong spices. This includes black pepper, white pepper, red pepper, cayenne, curry powder, cloves, ginger, and chili powder. The items listed above may not be a complete list of foods and drinks to avoid. Contact your dietitian for more information.  This information is not intended to replace advice given to you by your health care provider. Make sure you discuss any questions you have with your health care provider. Document Released: 06/03/2012 Document Revised: 05/10/2016 Document Reviewed: 10/07/2013 Elsevier Interactive Patient Education  2017 [employer].   The entirety of the information documented in the History of Present Illness, Review of Systems and Physical Exam were personally obtained by me. Portions of this information were initially documented by Ashley Royalty, CMA and reviewed by me for thoroughness and accuracy.          Trinna Post, PA-C  [facility]

## 2017-03-05 DIAGNOSIS — Z8719 Personal history of other diseases of the digestive system: Secondary | ICD-10-CM | POA: Diagnosis not present

## 2017-03-05 DIAGNOSIS — H018 Other specified inflammations of eyelid: Secondary | ICD-10-CM | POA: Diagnosis not present

## 2017-03-05 DIAGNOSIS — K219 Gastro-esophageal reflux disease without esophagitis: Secondary | ICD-10-CM | POA: Diagnosis not present

## 2017-03-05 DIAGNOSIS — Z86010 Personal history of colonic polyps: Secondary | ICD-10-CM | POA: Diagnosis not present

## 2017-03-15 ENCOUNTER — Telehealth: Payer: Self-pay | Admitting: Family Medicine

## 2017-03-15 NOTE — Telephone Encounter (Signed)
Called Pt to schedule AWV with [facility] - knb °

## 2017-07-30 ENCOUNTER — Telehealth: Payer: Self-pay | Admitting: Family Medicine

## 2017-07-30 NOTE — Telephone Encounter (Signed)
Pt needs refill on her meloxicam 15mg   [facility].  Thank s Con Memos

## 2017-07-30 NOTE — Telephone Encounter (Signed)
Hasn't been refilled since 2015, at that time for costochondritis and other times neck sprain. Patient also has history of chronic gastritis from EGD biopsy. Don't feel this refill is appropriate.

## 2017-07-30 NOTE — Telephone Encounter (Signed)
Needs OV, thanks

## 2017-07-30 NOTE — Telephone Encounter (Signed)
Pt reports she "hurt her back" a few days ago and Meloxicam helps her.  She will only need it for a few days.  She will call back in a few weeks to establish care with Dr. Brita Romp.  Thanks,   -Mickel Baas

## 2017-07-31 NOTE — Telephone Encounter (Signed)
Pt advised.   She is going to call back and schedule an appointment if her back does not improve.   Thanks,   -Mickel Baas

## 2017-09-12 DIAGNOSIS — D2261 Melanocytic nevi of right upper limb, including shoulder: Secondary | ICD-10-CM | POA: Diagnosis not present

## 2017-09-12 DIAGNOSIS — D045 Carcinoma in situ of skin of trunk: Secondary | ICD-10-CM | POA: Diagnosis not present

## 2017-09-12 DIAGNOSIS — D2271 Melanocytic nevi of right lower limb, including hip: Secondary | ICD-10-CM | POA: Diagnosis not present

## 2017-09-12 DIAGNOSIS — D2272 Melanocytic nevi of left lower limb, including hip: Secondary | ICD-10-CM | POA: Diagnosis not present

## 2017-09-12 DIAGNOSIS — D485 Neoplasm of uncertain behavior of skin: Secondary | ICD-10-CM | POA: Diagnosis not present

## 2017-09-12 DIAGNOSIS — D1724 Benign lipomatous neoplasm of skin and subcutaneous tissue of left leg: Secondary | ICD-10-CM | POA: Diagnosis not present

## 2017-10-02 ENCOUNTER — Encounter: Payer: Self-pay | Admitting: Family Medicine

## 2017-10-02 ENCOUNTER — Ambulatory Visit (INDEPENDENT_AMBULATORY_CARE_PROVIDER_SITE_OTHER): Payer: PPO | Admitting: Family Medicine

## 2017-10-02 ENCOUNTER — Ambulatory Visit: Payer: PPO | Admitting: Family Medicine

## 2017-10-02 VITALS — BP 130/84 | HR 55 | Temp 97.7°F | Resp 16 | Wt 121.2 lb

## 2017-10-02 DIAGNOSIS — F5102 Adjustment insomnia: Secondary | ICD-10-CM

## 2017-10-02 MED ORDER — ZOLPIDEM TARTRATE 5 MG PO TABS: ORAL_TABLET | ORAL | 0 refills | Status: DC

## 2017-10-02 NOTE — Progress Notes (Signed)
Subjective:     Patient ID: Alexis Lyons, female   DOB: 1941/05/19, 76 y.o.   MRN: [id number]  HPI  Chief Complaint  Patient presents with  . Insomnia    Patient comes into office today requesting refill on Ambien prescription, [place] and states that she takes half tablet of ambien at night PRN.   States she will be establishing with Dr. Jacinto Reap. Next month. Reports she travels a lot and uses it during those times.   Review of Systems     Objective:   Physical Exam  Constitutional: She appears well-developed and well-nourished. No distress.       Assessment:    1. Adjustment insomnia - zolpidem (AMBIEN) 5 MG tablet; 1/2 pill at bedtime as needed for insomnia  Dispense: 15 tablet; Refill: 0    Plan:    F/u with Dr. Frederik Schmidt scheduled.

## 2017-10-29 NOTE — Telephone Encounter (Signed)
This encounter was created in error - please disregard.

## 2017-11-05 ENCOUNTER — Ambulatory Visit (INDEPENDENT_AMBULATORY_CARE_PROVIDER_SITE_OTHER): Payer: PPO | Admitting: Family Medicine

## 2017-11-05 ENCOUNTER — Encounter: Payer: Self-pay | Admitting: Family Medicine

## 2017-11-05 VITALS — BP 124/80 | HR 60 | Temp 97.6°F | Resp 16 | Ht 63.5 in | Wt 123.0 lb

## 2017-11-05 DIAGNOSIS — Z0000 Encounter for general adult medical examination without abnormal findings: Secondary | ICD-10-CM

## 2017-11-05 DIAGNOSIS — Z1231 Encounter for screening mammogram for malignant neoplasm of breast: Secondary | ICD-10-CM | POA: Diagnosis not present

## 2017-11-05 DIAGNOSIS — F5102 Adjustment insomnia: Secondary | ICD-10-CM

## 2017-11-05 DIAGNOSIS — L219 Seborrheic dermatitis, unspecified: Secondary | ICD-10-CM | POA: Diagnosis not present

## 2017-11-05 DIAGNOSIS — Z86010 Personal history of colonic polyps: Secondary | ICD-10-CM

## 2017-11-05 DIAGNOSIS — M8580 Other specified disorders of bone density and structure, unspecified site: Secondary | ICD-10-CM | POA: Diagnosis not present

## 2017-11-05 MED ORDER — KETOCONAZOLE 2 % EX SHAM: 1.0000 "application " | MEDICATED_SHAMPOO | CUTANEOUS | 0 refills | Status: DC

## 2017-11-05 MED ORDER — ZOLPIDEM TARTRATE 5 MG PO TABS: 2.5000 mg | ORAL_TABLET | Freq: Every evening | ORAL | 1 refills | Status: DC | PRN

## 2017-11-05 NOTE — Progress Notes (Signed)
Patient: Alexis Lyons, Female    DOB: 1941/11/12, 76 y.o.   MRN: [id number] Visit Date: 11/06/2017  Today's Provider: Lavon Paganini, MD   Chief Complaint  Patient presents with  . Medicare Wellness   Subjective:    Annual wellness visit Alexis Lyons is a 76 y.o. female. She feels fairly well. She reports exercising daily. Walks/runs for 3 miles. She reports she is sleeping well.  Last AWV- 11/24/2015 Last colonoscopy- 07/22/2012- Diverticulosis. Repeat 5 years. Last mammogram- 10/28/2014- BI-RADS 1 Last BMD- 07/02/2008- osteopenia. Does not to recheck this. Declines Prevnar and flu vaccine. -----------------------------------------------------------   Review of Systems  Constitutional: Negative.   HENT: Negative.   Eyes: Negative.   Respiratory: Negative.   Cardiovascular: Negative.   Gastrointestinal: Negative.   Endocrine: Negative.   Genitourinary: Negative.   Musculoskeletal: Negative.   Skin: Negative.   Allergic/Immunologic: Negative.   Neurological: Negative.   Hematological: Negative.   Psychiatric/Behavioral: Negative.     Social History   Socioeconomic History  . Marital status: Divorced    Spouse name: Not on file  . Number of children: 2  . Years of education: 58  . Highest education level: Not on file  Social Needs  . Financial resource strain: Not on file  . Food insecurity - worry: Not on file  . Food insecurity - inability: Not on file  . Transportation needs - medical: Not on file  . Transportation needs - non-medical: Not on file  Occupational History  . Occupation: [employer] in [place] Use  . Smoking status: Former Smoker    Packs/day: 0.50    Years: 15.00    Pack years: 7.50    Types: Cigarettes    Last attempt to quit: 12/16/1985    Years since quitting: 31.9  . Smokeless tobacco: Never Used  Substance and Sexual Activity  . Alcohol use: Yes    Alcohol/week: 4.2 oz    Types: 7 Glasses of wine per week     Comment: glass of wine with dinner  . Drug use: No  . Sexual activity: Not Currently  Other Topics Concern  . Not on file  Social History Narrative  . Not on file    Past Medical History:  Diagnosis Date  . GERD (gastroesophageal reflux disease)      Patient Active Problem List   Diagnosis Date Noted  . Osteopenia 11/06/2017  . Seborrheic dermatitis 11/05/2017  . Aphthae 05/28/2015  . DD (diverticular disease) 05/28/2015  . Fatigue 05/28/2015  . Hernia, inguinal, right 05/28/2015  . Amnesia 05/28/2015  . 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 Atrophy of vagina 05/28/2015  . Vaginal lesion 09/21/2008  . Acid reflux 06/15/2008  . Insomnia 12/17/2004    Past Surgical History:  Procedure Laterality Date  . CHOLECYSTECTOMY    . HERNIA REPAIR Right 2004   inguinal   . HERNIA REPAIR     x's 3    Her family history includes Alcohol abuse in her father; Alzheimer's disease in her mother; Cancer in her father and sister.      Current Outpatient Medications:  .  Ginseng 100 MG CAPS, , Disp: , Rfl:  .  zolpidem (AMBIEN) 5 MG tablet, Take 0.5 tablets (2.5 mg total) by mouth at bedtime as needed for sleep., Disp: 15 tablet, Rfl: 1 .  [START ON 11/07/2017] ketoconazole (NIZORAL) 2 % shampoo, Apply 1 application topically 2 (two) times a week., Disp: 120 mL, Rfl: 0  Patient  Care Team: Virginia Crews, MD as PCP - General (Family Medicine)     Objective:   Vitals: BP 124/80 (BP Location: Left Arm, Patient Position: Sitting, Cuff Size: Normal)   Pulse 60   Temp 97.6 F (36.4 C) (Oral)   Resp 16   Ht 5' 3.5" (1.613 m)   Wt 123 lb (55.8 kg)   BMI 21.45 kg/m   Physical Exam  Constitutional: She is oriented to person, place, and time. She appears well-developed and well-nourished. No distress.  HENT:  Head: Normocephalic and atraumatic.  Right Ear: External ear normal.  Left Ear: External ear normal.  Nose: Nose normal.  Mouth/Throat: Oropharynx is clear  and moist.  Eyes: Conjunctivae and EOM are normal. Pupils are equal, round, and reactive to light. No scleral icterus.  Neck: Neck supple. No thyromegaly present.  Cardiovascular: Normal rate, regular rhythm, normal heart sounds and intact distal pulses.  No murmur heard. Pulmonary/Chest: Breath sounds normal. No respiratory distress. She has no wheezes. She has no rales.  Breasts: breasts appear normal, no suspicious masses, no skin or nipple changes or axillary nodes.  Abdominal: Soft. Bowel sounds are normal. She exhibits no distension. There is no tenderness. There is no rebound and no guarding.  Musculoskeletal: She exhibits no edema or deformity.  Lymphadenopathy:    She has no cervical adenopathy.  Neurological: She is alert and oriented to person, place, and time.  Skin: Skin is warm and dry.  + seb derm of scalp  Psychiatric: She has a normal mood and affect. Her behavior is normal.  Vitals reviewed.   Activities of Daily Living In your present state of health, do you have any difficulty performing the following activities: 11/05/2017  Hearing? N  Vision? N  Difficulty concentrating or making decisions? N  Walking or climbing stairs? N  Dressing or bathing? N  Doing errands, shopping? N  Some recent data might be hidden    Fall Risk Assessment Fall Risk  11/05/2017 11/24/2015  Falls in the past year? No No     Depression Screen PHQ 2/9 Scores 11/05/2017 11/24/2015  PHQ - 2 Score 0 0    Cognitive Testing - 6-CIT  Correct? Score   What year is it? yes 0 0 or 4  What month is it? yes 0 0 or 3  Memorize:    [name],  [birth year],  [place],      What time is it? (within 1 hour) yes 0 0 or 3  Count backwards from 20 yes 0 0, 2, or 4  Name the months of the year yes 0 0, 2, or 4  Repeat name & address above yes 0 0, 2, 4, 6, 8, or 10       TOTAL SCORE  0/28   Interpretation:  Normal  Normal (0-7) Abnormal (8-28)    Audit-C Alcohol Use Screening  Question  Answer Points  How often do you have alcoholic drink? 1 times daily 4  On days you do drink alcohol, how many drinks do you typically consume? 0 0  How oftey will you drink 6 or more in a total? never 0  Total Score:  4   A score of 3 or more in women, and 4 or more in men indicates increased risk for alcohol abuse, EXCEPT if all of the points are from question 1.    Assessment & Plan:     Annual Wellness Visit  Reviewed patient's Family Medical History  Reviewed and updated list of patient's medical providers Assessment of cognitive impairment was done Assessed patient's functional ability Established a written schedule for health screening [place] Completed and Reviewed  Exercise Activities and Dietary recommendations Goals    None      Immunization History  Administered Date(s) Administered  . Hepatitis A 07/26/2015  . Pneumococcal Polysaccharide-23 01/01/2011  . Td 06/23/2008  . Typhoid Inactivated 07/26/2015    Health Maintenance  Topic Date Due  . INFLUENZA VACCINE  03/31/2018 (Originally 07/17/2017)  . PNA vac Low Risk Adult (2 of 2 - PCV13) 11/05/2018 (Originally 01/02/2012)  . TETANUS/TDAP  06/23/2018  . COLONOSCOPY  07/22/2022  . DEXA SCAN  Completed     Discussed health benefits of physical activity, and encouraged her to engage in regular exercise appropriate for her age and condition.    Problem List Items Addressed This Visit      Musculoskeletal and Integument   Seborrheic dermatitis    Noted on exam Treat with ketoconazole shampoo 2x/week May also use selsun blue for maintenance or recurrence in the future      Osteopenia    Patient declines further testing, especially as this would not change her management She would not want treatment even if she had progressed to osteoporosis Discussed not smoking, regular weight bearing exercise, Ca/Vit D supplementation        Other   Insomnia    Doing well on Ambien 2.5mg  qhs  prn when traveling and changing time zones Takes sparingly Discussed risks of hypersomnolence, falls, and confusion, specially in the elderly Will not plan to escalate dose or number of pills given in the future      Relevant Medications   zolpidem (AMBIEN) 5 MG tablet    Other Visit Diagnoses    Medicare annual wellness visit, subsequent    -  Primary   History of adenomatous polyp of colon       Relevant Orders   Ambulatory referral to Gastroenterology   Healthcare maintenance       Relevant Orders   Lipid panel   CBC   Comprehensive metabolic panel   Encounter for screening mammogram for breast cancer       Relevant Orders   MM SCREENING BREAST TOMO BILATERAL      ------------------------------------------------------------------------------------------------------------ Return in about 1 year (around 11/05/2018).  The entirety of the information documented in the History of Present Illness, Review of Systems and Physical Exam were personally obtained by me. Portions of this information were initially documented by Raquel Sarna Ratchford, CMA and reviewed by me for thoroughness and accuracy.     Lavon Paganini, MD  [facility]

## 2017-11-05 NOTE — Patient Instructions (Addendum)
Recommendation for taking Calcium 1240m and Vitamin D 1000 units daily  Can try Selsun Blue shampoo  Preventive Care 75 Years and Older, Female Preventive care refers to lifestyle choices and visits with your health care provider that can promote health and wellness. What does preventive care include?  A yearly physical exam. This is also called an annual well check.  Dental exams once or twice a year.  Routine eye exams. Ask your health care provider how often you should have your eyes checked.  Personal lifestyle choices, including: ? Daily care of your teeth and gums. ? Regular physical activity. ? Eating a healthy diet. ? Avoiding tobacco and drug use. ? Limiting alcohol use. ? Practicing safe sex. ? Taking low-dose aspirin every day. ? Taking vitamin and mineral supplements as recommended by your health care provider. What happens during an annual well check? The services and screenings done by your health care provider during your annual well check will depend on your age, overall health, lifestyle risk factors, and family history of disease. Counseling Your health care provider may ask you questions about your:  Alcohol use.  Tobacco use.  Drug use.  Emotional well-being.  Home and relationship well-being.  Sexual activity.  Eating habits.  History of falls.  Memory and ability to understand (cognition).  Work and work eStatistician  Reproductive health.  Screening You may have the following tests or measurements:  Height, weight, and BMI.  Blood pressure.  Lipid and cholesterol levels. These may be checked every 5 years, or more frequently if you are over 75years old.  Skin check.  Lung cancer screening. You may have this screening every year starting at age 75if you have a 30-pack-year history of smoking and currently smoke or have quit within the past 15 years.  Fecal occult blood test (FOBT) of the stool. You may have this test every year  starting at age 75  Flexible sigmoidoscopy or colonoscopy. You may have a sigmoidoscopy every 5 years or a colonoscopy every 10 years starting at age 75  Hepatitis C blood test.  Hepatitis B blood test.  Sexually transmitted disease (STD) testing.  Diabetes screening. This is done by checking your blood sugar (glucose) after you have not eaten for a while (fasting). You may have this done every 1-3 years.  Bone density scan. This is done to screen for osteoporosis. You may have this done starting at age 75  Mammogram. This may be done every 1-2 years. Talk to your health care provider about how often you should have regular mammograms.  Talk with your health care provider about your test results, treatment options, and if necessary, the need for more tests. Vaccines Your health care provider may recommend certain vaccines, such as:  Influenza vaccine. This is recommended every year.  Tetanus, diphtheria, and acellular pertussis (Tdap, Td) vaccine. You may need a Td booster every 10 years.  Varicella vaccine. You may need this if you have not been vaccinated.  Zoster vaccine. You may need this after age 75  Measles, mumps, and rubella (MMR) vaccine. You may need at least one dose of MMR if you were born in 1957 or later. You may also need a second dose.  Pneumococcal 13-valent conjugate (PCV13) vaccine. One dose is recommended after age 75  Pneumococcal polysaccharide (PPSV23) vaccine. One dose is recommended after age 75  Meningococcal vaccine. You may need this if you have certain conditions.  Hepatitis A vaccine. You may need this if you  have certain conditions or if you travel or work in places where you may be exposed to hepatitis A.  Hepatitis B vaccine. You may need this if you have certain conditions or if you travel or work in places where you may be exposed to hepatitis B.  Haemophilus influenzae type b (Hib) vaccine. You may need this if you have certain  conditions.  Talk to your health care provider about which screenings and vaccines you need and how often you need them. This information is not intended to replace advice given to you by your health care provider. Make sure you discuss any questions you have with your health care provider. Document Released: 12/30/2015 Document Revised: 08/22/2016 Document Reviewed: 10/04/2015 Elsevier Interactive Patient Education  2017 [employer].

## 2017-11-06 DIAGNOSIS — M8580 Other specified disorders of bone density and structure, unspecified site: Secondary | ICD-10-CM | POA: Insufficient documentation

## 2017-11-06 NOTE — Assessment & Plan Note (Signed)
Noted on exam Treat with ketoconazole shampoo 2x/week May also use selsun blue for maintenance or recurrence in the future

## 2017-11-06 NOTE — Assessment & Plan Note (Signed)
Doing well on Ambien 2.5mg  qhs prn when traveling and changing time zones Takes sparingly Discussed risks of hypersomnolence, falls, and confusion, specially in the elderly Will not plan to escalate dose or number of pills given in the future

## 2017-11-06 NOTE — Assessment & Plan Note (Signed)
Patient declines further testing, especially as this would not change her management She would not want treatment even if she had progressed to osteoporosis Discussed not smoking, regular weight bearing exercise, Ca/Vit D supplementation

## 2017-11-11 DIAGNOSIS — L905 Scar conditions and fibrosis of skin: Secondary | ICD-10-CM | POA: Diagnosis not present

## 2017-11-11 DIAGNOSIS — L570 Actinic keratosis: Secondary | ICD-10-CM | POA: Diagnosis not present

## 2017-11-11 DIAGNOSIS — L239 Allergic contact dermatitis, unspecified cause: Secondary | ICD-10-CM | POA: Diagnosis not present

## 2017-11-11 DIAGNOSIS — D485 Neoplasm of uncertain behavior of skin: Secondary | ICD-10-CM | POA: Diagnosis not present

## 2017-11-11 DIAGNOSIS — D045 Carcinoma in situ of skin of trunk: Secondary | ICD-10-CM | POA: Diagnosis not present

## 2017-11-11 DIAGNOSIS — L400 Psoriasis vulgaris: Secondary | ICD-10-CM | POA: Diagnosis not present

## 2017-11-11 DIAGNOSIS — X32XXXA Exposure to sunlight, initial encounter: Secondary | ICD-10-CM | POA: Diagnosis not present

## 2017-11-11 DIAGNOSIS — C44519 Basal cell carcinoma of skin of other part of trunk: Secondary | ICD-10-CM | POA: Diagnosis not present

## 2017-12-02 ENCOUNTER — Other Ambulatory Visit: Payer: Self-pay

## 2017-12-02 ENCOUNTER — Ambulatory Visit: Payer: PPO | Admitting: Family Medicine

## 2017-12-02 VITALS — BP 134/94 | HR 66 | Temp 97.8°F | Resp 16 | Wt 125.0 lb

## 2017-12-02 DIAGNOSIS — J029 Acute pharyngitis, unspecified: Secondary | ICD-10-CM | POA: Diagnosis not present

## 2017-12-02 DIAGNOSIS — R509 Fever, unspecified: Secondary | ICD-10-CM

## 2017-12-02 LAB — POCT INFLUENZA A/B
INFLUENZA A, POC: NEGATIVE
INFLUENZA B, POC: NEGATIVE

## 2017-12-02 LAB — POCT RAPID STREP A (OFFICE): RAPID STREP A SCREEN: NEGATIVE

## 2017-12-02 NOTE — Progress Notes (Signed)
Patient: Alexis Lyons Female    DOB: 1941/07/24   76 y.o.   MRN: [id number] Visit Date: 12/02/2017  Today's Provider: Lavon Paganini, MD   Chief Complaint  Patient presents with  . Sore Throat   I, Emily Ratchford, CMA, am acting as scribe for Lavon Paganini, MD.  Subjective:    Sore Throat   This is a new problem. Episode onset: x 4 days. The problem has been unchanged. The pain is worse on the left side. The maximum temperature recorded prior to her arrival was 101 - 101.9 F. The fever has been present for 1 to 2 days. The pain is at a severity of 7/10. The pain is moderate. Associated symptoms include headaches and a hoarse voice. Pertinent negatives include no abdominal pain, congestion, coughing, ear discharge, ear pain, plugged ear sensation, neck pain, shortness of breath, swollen glands, trouble swallowing or vomiting. Associated symptoms comments: Main complaint is decreased energy level and sore throat. She has had no exposure to strep. She has tried acetaminophen (Loratadine 10 mg for sneezing, watery eyes) for the symptoms. The treatment provided moderate relief.   Patient also reports body aches that are improving.    Allergies  Allergen Reactions  . Codeine      Current Outpatient Medications:  .  fluocinonide (LIDEX) 0.05 % external solution, , Disp: , Rfl:  .  Ginseng 100 MG CAPS, , Disp: , Rfl:  .  zolpidem (AMBIEN) 5 MG tablet, Take 0.5 tablets (2.5 mg total) by mouth at bedtime as needed for sleep., Disp: 15 tablet, Rfl: 1  Review of Systems  HENT: Positive for hoarse voice. Negative for congestion, ear discharge, ear pain and trouble swallowing.   Respiratory: Negative for cough and shortness of breath.   Gastrointestinal: Negative for abdominal pain and vomiting.  Musculoskeletal: Negative for neck pain.  Neurological: Positive for headaches.    Social History   Tobacco Use  . Smoking status: Former Smoker    Packs/day: 0.50   Years: 15.00    Pack years: 7.50    Types: Cigarettes    Last attempt to quit: 12/16/1985    Years since quitting: 31.9  . Smokeless tobacco: Never Used  Substance Use Topics  . Alcohol use: Yes    Alcohol/week: 4.2 oz    Types: 7 Glasses of wine per week    Comment: glass of wine with dinner   Objective:   BP (!) 134/94 (BP Location: Left Arm, Patient Position: Sitting, Cuff Size: Normal)   Pulse 66   Temp 97.8 F (36.6 C) (Oral)   Resp 16   Wt 125 lb (56.7 kg)   SpO2 99%   BMI 21.80 kg/m  Vitals:   12/02/17 1000  BP: (!) 134/94  Pulse: 66  Resp: 16  Temp: 97.8 F (36.6 C)  TempSrc: Oral  SpO2: 99%  Weight: 125 lb (56.7 kg)     Physical Exam  Constitutional: She is oriented to person, place, and time. She appears well-developed and well-nourished. No distress.  HENT:  Head: Normocephalic and atraumatic.  Right Ear: External ear normal.  Left Ear: External ear normal.  Nose: Nose normal.  Mouth/Throat: No oropharyngeal exudate.  Mild OP erythema  Eyes: Conjunctivae are normal. Pupils are equal, round, and reactive to light.  Neck: Neck supple. No thyromegaly present.  Cardiovascular: Normal rate, regular rhythm, normal heart sounds and intact distal pulses.  No murmur heard. Pulmonary/Chest: Effort normal and breath sounds normal. No  respiratory distress. She has no wheezes. She has no rales.  Musculoskeletal: She exhibits no edema.  Lymphadenopathy:    She has no cervical adenopathy.  Neurological: She is alert and oriented to person, place, and time.  Psychiatric: She has a normal mood and affect. Her behavior is normal.  Vitals reviewed.    Results for orders placed or performed in visit on 12/02/17  POCT rapid strep A  Result Value Ref Range   Rapid Strep A Screen Negative Negative  POCT Influenza A/B  Result Value Ref Range   Influenza A, POC Negative Negative   Influenza B, POC Negative Negative       Assessment & Plan:     1. Fever,  unspecified fever cause 2. Sore throat - POCT Influenza A/B - POCT rapid strep A - negative for Strep and Flu - no evidence of bacterial infection, likely viral URI - treat symptomatically, discussed natural course and return precautions  Return if symptoms worsen or fail to improve.       The entirety of the information documented in the History of Present Illness, Review of Systems and Physical Exam were personally obtained by me. Portions of this information were initially documented by Raquel Sarna Ratchford, CMA and reviewed by me for thoroughness and accuracy.   Virginia Crews, MD, MPH [facility] 12/02/2017 11:28 AM

## 2017-12-04 DIAGNOSIS — H1032 Unspecified acute conjunctivitis, left eye: Secondary | ICD-10-CM | POA: Diagnosis not present

## 2017-12-17 DIAGNOSIS — R569 Unspecified convulsions: Secondary | ICD-10-CM

## 2017-12-17 HISTORY — DX: Unspecified convulsions: R56.9

## 2017-12-19 DIAGNOSIS — L239 Allergic contact dermatitis, unspecified cause: Secondary | ICD-10-CM | POA: Diagnosis not present

## 2017-12-20 DIAGNOSIS — Z86010 Personal history of colonic polyps: Secondary | ICD-10-CM | POA: Diagnosis not present

## 2017-12-20 DIAGNOSIS — K2970 Gastritis, unspecified, without bleeding: Secondary | ICD-10-CM | POA: Diagnosis not present

## 2017-12-20 DIAGNOSIS — R1013 Epigastric pain: Secondary | ICD-10-CM | POA: Diagnosis not present

## 2017-12-23 ENCOUNTER — Ambulatory Visit (INDEPENDENT_AMBULATORY_CARE_PROVIDER_SITE_OTHER): Payer: PPO | Admitting: Family Medicine

## 2017-12-23 ENCOUNTER — Telehealth: Payer: Self-pay

## 2017-12-23 ENCOUNTER — Encounter: Payer: Self-pay | Admitting: Family Medicine

## 2017-12-23 VITALS — BP 116/90 | HR 89 | Temp 97.6°F | Resp 15 | Wt 124.8 lb

## 2017-12-23 DIAGNOSIS — R55 Syncope and collapse: Secondary | ICD-10-CM

## 2017-12-23 NOTE — Telephone Encounter (Signed)
Patient coming in office 10:30Am. KW

## 2017-12-23 NOTE — Progress Notes (Signed)
Subjective:     Patient ID: Alexis Lyons, female   DOB: 05-22-1941, 77 y.o.   MRN: [id number] Chief Complaint  Patient presents with  . Loss of Consciousness    Patient states this morning she had sgot out of bed and went to her knees, she states that she had a "weird sensation" in her head and fell over hitting her head on night stand. Patient states that when she woke she felt was more aware of her heart beat and felt very weak. Patient states that she now has a bump on the side of her scalp and a cutt below her eyebrow on left side.    HPI States she had gotten down on her knees to say her prayers in the AM as per routine when she passed out with subsequent mild injury as noted above. Denies precedent palpitations or loss of B & B after the episode. States she has has more frequent soft stools over the last 2-3 days but no diarrhea. Has one alcoholic drink with a meal the night before. No Ambien use. Does not describe the episode as dizziness.  Review of Systems     Objective:   Physical Exam  Constitutional: She appears well-developed and well-nourished. No distress.  HENT:  Right Ear: External ear normal.  Left Ear: External ear normal.  Eyes: EOM are normal.  Pupils equal and aphakic  Cardiovascular: Normal rate and regular rhythm.  Pulmonary/Chest: Breath sounds normal.  Musculoskeletal: She exhibits no edema (of lower extremities).  Grip strength 5/5 symmetrically  Neurological: Coordination (finger to Nose/heel to toe WNL. Romberg negative.) normal.  Skin:  Small left parietal scalp contusion. Two linear superficial abrasions to her left eyebrow area.       Assessment:    1. Syncope, unspecified syncope type - CBC with Differential/Platelet - Renal function panel    Plan:    Discussed increasing fluids and staying out of work today. Cold compresses to scalp contusion.

## 2017-12-23 NOTE — Telephone Encounter (Signed)
Pt states she had a syncopal episode this morning, fell and hit her head. Denies H/A, visual changes, lightheadedness, chest pain, SOB. Is experiencing palpitations and weakness. Appointment made for 10:30 today.

## 2017-12-23 NOTE — Patient Instructions (Signed)
We call you with the lab results. Replenish fluids-Gatorade sips every 5 minutes. Try to stay home today. Cold compress to you head contusion.

## 2017-12-24 ENCOUNTER — Telehealth: Payer: Self-pay

## 2017-12-24 LAB — RENAL FUNCTION PANEL
ALBUMIN: 5.1 g/dL — AB (ref 3.5–4.8)
BUN / CREAT RATIO: 14 (ref 12–28)
BUN: 13 mg/dL (ref 8–27)
CALCIUM: 10.1 mg/dL (ref 8.7–10.3)
CHLORIDE: 104 mmol/L (ref 96–106)
CO2: 24 mmol/L (ref 20–29)
Creatinine, Ser: 0.93 mg/dL (ref 0.57–1.00)
GFR calc Af Amer: 70 mL/min/{1.73_m2} (ref >59)
GFR calc non Af Amer: 60 mL/min/{1.73_m2} (ref >59)
Glucose: 89 mg/dL (ref 65–99)
PHOSPHORUS: 3.2 mg/dL (ref 2.5–4.5)
Potassium: 4.4 mmol/L (ref 3.5–5.2)
Sodium: 145 mmol/L — ABNORMAL HIGH (ref 134–144)

## 2017-12-24 LAB — CBC WITH DIFFERENTIAL/PLATELET
BASOS ABS: 0.1 10*3/uL (ref 0.0–0.2)
BASOS: 1 %
EOS (ABSOLUTE): 0.1 10*3/uL (ref 0.0–0.4)
Eos: 2 %
HEMOGLOBIN: 16.8 g/dL — AB (ref 11.1–15.9)
Hematocrit: 48.8 % — ABNORMAL HIGH (ref 34.0–46.6)
IMMATURE GRANS (ABS): 0 10*3/uL (ref 0.0–0.1)
Immature Granulocytes: 0 %
LYMPHS ABS: 1.5 10*3/uL (ref 0.7–3.1)
LYMPHS: 21 %
MCH: 30.3 pg (ref 26.6–33.0)
MCHC: 34.4 g/dL (ref 31.5–35.7)
MCV: 88 fL (ref 79–97)
Monocytes Absolute: 0.6 10*3/uL (ref 0.1–0.9)
Monocytes: 8 %
NEUTROS ABS: 5.1 10*3/uL (ref 1.4–7.0)
Neutrophils: 68 %
Platelets: 306 10*3/uL (ref 150–379)
RBC: 5.54 x10E6/uL — ABNORMAL HIGH (ref 3.77–5.28)
RDW: 13.4 % (ref 12.3–15.4)
WBC: 7.4 10*3/uL (ref 3.4–10.8)

## 2017-12-24 NOTE — Telephone Encounter (Signed)
Patient has been advised. KW 

## 2017-12-24 NOTE — Telephone Encounter (Signed)
-----   Message from Carmon Ginsberg, [place] sent at 12/24/2017  7:28 AM EST ----- Labs look ok.  If you still don't feel right or have another episode-follow up with Dr. B as well.

## 2017-12-27 ENCOUNTER — Ambulatory Visit
Admission: RE | Admit: 2017-12-27 | Discharge: 2017-12-27 | Disposition: A | Discharge status: Home / Self Care | Payer: PPO | Source: Ambulatory Visit | Attending: Family Medicine | Admitting: Family Medicine

## 2017-12-27 ENCOUNTER — Telehealth: Payer: Self-pay

## 2017-12-27 DIAGNOSIS — Z1231 Encounter for screening mammogram for malignant neoplasm of breast: Secondary | ICD-10-CM | POA: Diagnosis not present

## 2017-12-27 NOTE — Telephone Encounter (Signed)
-----   Message from Virginia Crews, MD sent at 12/27/2017  4:17 PM EST ----- Normal mammogram.  Repeat in 1 yr  Bacigalupo, Dionne Bucy, MD, MPH [facility] 12/27/2017 4:17 PM

## 2017-12-27 NOTE — Telephone Encounter (Signed)
Patient advised as below.  

## 2018-01-28 DIAGNOSIS — L905 Scar conditions and fibrosis of skin: Secondary | ICD-10-CM | POA: Diagnosis not present

## 2018-01-28 DIAGNOSIS — C44519 Basal cell carcinoma of skin of other part of trunk: Secondary | ICD-10-CM | POA: Diagnosis not present

## 2018-01-29 DIAGNOSIS — Z961 Presence of intraocular lens: Secondary | ICD-10-CM | POA: Diagnosis not present

## 2018-02-21 ENCOUNTER — Encounter: Payer: Self-pay | Admitting: *Deleted

## 2018-02-24 ENCOUNTER — Ambulatory Visit: Payer: PPO | Admitting: Certified Registered Nurse Anesthetist

## 2018-02-24 ENCOUNTER — Ambulatory Visit
Admission: RE | Admit: 2018-02-24 | Discharge: 2018-02-24 | Disposition: A | Discharge status: Home / Self Care | Payer: PPO | Source: Ambulatory Visit | Attending: Unknown Physician Specialty | Admitting: Unknown Physician Specialty

## 2018-02-24 ENCOUNTER — Encounter
Admission: RE | Disposition: A | Discharge status: Home / Self Care | Payer: Self-pay | Source: Ambulatory Visit | Attending: Unknown Physician Specialty

## 2018-02-24 ENCOUNTER — Other Ambulatory Visit: Payer: Self-pay

## 2018-02-24 DIAGNOSIS — Z86010 Personal history of colonic polyps: Secondary | ICD-10-CM | POA: Diagnosis not present

## 2018-02-24 DIAGNOSIS — D122 Benign neoplasm of ascending colon: Secondary | ICD-10-CM | POA: Insufficient documentation

## 2018-02-24 DIAGNOSIS — K640 First degree hemorrhoids: Secondary | ICD-10-CM | POA: Diagnosis not present

## 2018-02-24 DIAGNOSIS — Z1211 Encounter for screening for malignant neoplasm of colon: Secondary | ICD-10-CM | POA: Diagnosis not present

## 2018-02-24 DIAGNOSIS — K5730 Diverticulosis of large intestine without perforation or abscess without bleeding: Secondary | ICD-10-CM | POA: Insufficient documentation

## 2018-02-24 DIAGNOSIS — Z79899 Other long term (current) drug therapy: Secondary | ICD-10-CM | POA: Insufficient documentation

## 2018-02-24 DIAGNOSIS — Z9049 Acquired absence of other specified parts of digestive tract: Secondary | ICD-10-CM | POA: Diagnosis not present

## 2018-02-24 DIAGNOSIS — Q439 Congenital malformation of intestine, unspecified: Secondary | ICD-10-CM | POA: Diagnosis not present

## 2018-02-24 DIAGNOSIS — Z791 Long term (current) use of non-steroidal anti-inflammatories (NSAID): Secondary | ICD-10-CM | POA: Insufficient documentation

## 2018-02-24 DIAGNOSIS — Z87891 Personal history of nicotine dependence: Secondary | ICD-10-CM | POA: Insufficient documentation

## 2018-02-24 DIAGNOSIS — K635 Polyp of colon: Secondary | ICD-10-CM | POA: Diagnosis not present

## 2018-02-24 HISTORY — DX: Other amnesia: R41.3

## 2018-02-24 HISTORY — DX: Recurrent oral aphthae: K12.0

## 2018-02-24 HISTORY — DX: Unspecified convulsions: R56.9

## 2018-02-24 HISTORY — DX: Other specified conditions associated with female genital organs and menstrual cycle: N94.89

## 2018-02-24 HISTORY — PX: COLONOSCOPY WITH PROPOFOL: SHX5780

## 2018-02-24 HISTORY — DX: Diverticulosis of intestine, part unspecified, without perforation or abscess without bleeding: K57.90

## 2018-02-24 LAB — HM COLONOSCOPY

## 2018-02-24 SURGERY — COLONOSCOPY WITH PROPOFOL
Anesthesia: General

## 2018-02-24 MED ORDER — PROPOFOL 500 MG/50ML IV EMUL
INTRAVENOUS | Status: DC | PRN
  Administered 2018-02-24: 130 ug/kg/min via INTRAVENOUS

## 2018-02-24 MED ORDER — SODIUM CHLORIDE 0.9 % IJ SOLN
INTRAMUSCULAR | Status: DC | PRN
  Administered 2018-02-24: 2 mL via INTRAVENOUS

## 2018-02-24 MED ORDER — SODIUM CHLORIDE 0.9 % IV SOLN
INTRAVENOUS | Status: DC
  Administered 2018-02-24: 14:00:00 via INTRAVENOUS

## 2018-02-24 MED ORDER — PROPOFOL 10 MG/ML IV BOLUS
INTRAVENOUS | Status: DC | PRN
  Administered 2018-02-24: 90 mg via INTRAVENOUS

## 2018-02-24 MED ORDER — SODIUM CHLORIDE 0.9 % IV SOLN: INTRAVENOUS | Status: DC

## 2018-02-24 MED ORDER — LIDOCAINE HCL (CARDIAC) 20 MG/ML IV SOLN
INTRAVENOUS | Status: DC | PRN
  Administered 2018-02-24: 50 mg via INTRAVENOUS

## 2018-02-24 MED ORDER — LIDOCAINE HCL (PF) 2 % IJ SOLN
INTRAMUSCULAR | Status: AC
  Filled 2018-02-24: qty 10

## 2018-02-24 MED ORDER — PROPOFOL 500 MG/50ML IV EMUL
INTRAVENOUS | Status: AC
  Filled 2018-02-24: qty 50

## 2018-02-24 NOTE — H&P (Signed)
Primary Care Physician:  Virginia Crews, MD Primary Gastroenterologist:  Dr. Vira Agar  Pre-Procedure History & Physical: HPI:  Alexis Lyons is a 77 y.o. female is here for an colonoscopy.  This is for personal history of colon polyps.   Past Medical History:  Diagnosis Date  . Amnesia   . Aphthae   . Congestion-fibrosis syndrome   . DD (diverticular disease)   . GERD (gastroesophageal reflux disease)   . Seizures ([place]) 12/2017   passed out due to dehydration    Past Surgical History:  Procedure Laterality Date  . CHOLECYSTECTOMY    . colonoscopy with polypectomy     8/11 4 mm polyp, 12 mm polyp  . HERNIA REPAIR Right 2004   inguinal   . HERNIA REPAIR     x's 3    Prior to Admission medications   Medication Sig Start Date End Date Taking? Authorizing Provider  meloxicam (MOBIC) 15 MG tablet Take 15 mg by mouth.   Yes [provider]  zolpidem (AMBIEN) 5 MG tablet Take 0.5 tablets (2.5 mg total) by mouth at bedtime as needed for sleep. 11/05/17  Yes Virginia Crews, MD  fluocinonide (LIDEX) 0.05 % external solution  11/11/17   [provider]  Ginseng 100 MG CAPS every other day.  06/08/09   [provider]    Allergies as of 01/20/2018 - Review Complete 12/23/2017  Allergen Reaction Noted  . Codeine  05/28/2015    Family History  Problem Relation Age of Onset  . Alzheimer's disease Mother   . Alcohol abuse Father   . Cancer Father        liver cancer  . Cancer Sister        breast  . Breast cancer Sister     Social History   Socioeconomic History  . Marital status: Divorced    Spouse name: Not on file  . Number of children: 2  . Years of education: 19  . Highest education level: Not on file  Social Needs  . Financial resource strain: Not on file  . Food insecurity - worry: Not on file  . Food insecurity - inability: Not on file  . Transportation needs - medical: Not on file  . Transportation needs -  non-medical: Not on file  Occupational History  . Occupation: [employer] in [place] Use  . Smoking status: Former Smoker    Packs/day: 0.50    Years: 15.00    Pack years: 7.50    Types: Cigarettes    Last attempt to quit: 12/16/1985    Years since quitting: 32.2  . Smokeless tobacco: Never Used  Substance and Sexual Activity  . Alcohol use: Yes    Alcohol/week: 4.2 oz    Types: 7 Glasses of wine per week    Comment: glass of wine with dinner  . Drug use: No  . Sexual activity: Not Currently  Other Topics Concern  . Not on file  Social History Narrative  . Not on file    Review of Systems: See HPI, otherwise negative ROS  Physical Exam: BP (!) 179/83   Pulse (!) 59   Temp (!) 97.4 F (36.3 C) (Tympanic)   Resp 18   Ht 5' 3.5" (1.613 m)   Wt 53.1 kg (117 lb)   SpO2 99%   BMI 20.40 kg/m  General:   Alert,  pleasant and cooperative in NAD Head:  Normocephalic and atraumatic. Neck:  Supple; no masses or thyromegaly.  Lungs:  Clear throughout to auscultation.    Heart:  Regular rate and rhythm. Abdomen:  Soft, nontender and nondistended. Normal bowel sounds, without guarding, and without rebound.   Neurologic:  Alert and  oriented x4;  grossly normal neurologically.  Impression/Plan: Alexis Lyons is here for an colonoscopy to be performed for [facility] colon polyps.  Risks, benefits, limitations, and alternatives regarding  colonoscopy have been reviewed with the patient.  Questions have been answered.  All parties agreeable.   Gaylyn Cheers, MD  02/24/2018, 2:22 PM

## 2018-02-24 NOTE — Op Note (Signed)
[facility] Gastroenterology Patient Name: Alexis Lyons Procedure Date: 02/24/2018 2:18 PM MRN: [id number] Account #: [id number] Date of Birth: 01-10-1941 Admit Type: Outpatient Age: 77 Room: [facility] ENDO ROOM 3 Gender: Female Note Status: Finalized Procedure:            Colonoscopy Indications:          High risk colon cancer surveillance: Personal history                        of colonic polyps Providers:            Manya Silvas, MD Referring MD:         Dionne Bucy. Bacigalupo (Referring MD) Medicines:            Propofol per Anesthesia Complications:        No immediate complications. Procedure:            Pre-Anesthesia Assessment:                       - After reviewing the risks and benefits, the patient                        was deemed in satisfactory condition to undergo the                        procedure.                       After obtaining informed consent, the colonoscope was                        passed under direct vision. Throughout the procedure,                        the patient's blood pressure, pulse, and oxygen                        saturations were monitored continuously. The                        Colonoscope was introduced through the anus and                        advanced to the the cecum, identified by appendiceal                        orifice and ileocecal valve. The colonoscopy was                        somewhat difficult due to a tortuous colon. Successful                        completion of the procedure was aided by applying                        abdominal pressure. The patient tolerated the procedure                        well. The quality of the bowel preparation was  excellent. Findings:      A small polyp was found in the proximal ascending colon. The polyp was       sessile. The polyp was removed with a hot snare. Resection and retrieval       were complete.      A few small-mouthed  diverticula were found in the sigmoid colon.      Internal hemorrhoids were found during endoscopy. The hemorrhoids were       small and Grade I (internal hemorrhoids that do not prolapse).      The exam was otherwise without abnormality.      The procedure was done slowly with no abnormal distention of the right       colon Impression:           - One small polyp in the proximal ascending colon,                        removed with a hot snare. Resected and retrieved.                       - Diverticulosis in the sigmoid colon.                       - Internal hemorrhoids.                       - The examination was otherwise normal. Recommendation:       - Await pathology results. Manya Silvas, MD 02/24/2018 3:19:11 PM This report has been signed electronically. Number of Addenda: 0 Note Initiated On: 02/24/2018 2:18 PM Scope Withdrawal Time: 0 hours 18 minutes 11 seconds  Total Procedure Duration: 0 hours 43 minutes 57 seconds       [facility]

## 2018-02-24 NOTE — Transfer of Care (Signed)
Immediate Anesthesia Transfer of Care Note  Patient: Alexis Lyons  Procedure(s) Performed: COLONOSCOPY WITH PROPOFOL (N/A )  Patient Location: PACU  Anesthesia Type:General  Level of Consciousness: sedated  Airway & Oxygen Therapy: Patient Spontanous Breathing and Patient connected to face mask oxygen  Post-op Assessment: Report given to RN and Post -op Vital signs reviewed and stable  Post vital signs: Reviewed and stable  Last Vitals:  Vitals:   02/24/18 1358  BP: (!) 179/83  Pulse: (!) 59  Resp: 18  Temp: (!) 36.3 C  SpO2: 99%    Last Pain:  Vitals:   02/24/18 1358  TempSrc: Tympanic         Complications: No apparent anesthesia complications

## 2018-02-24 NOTE — Anesthesia Preprocedure Evaluation (Signed)
Anesthesia Evaluation  Patient identified by MRN, date of birth, ID band Patient awake    Reviewed: Allergy & Precautions, NPO status , Patient's Chart, lab work & pertinent test results  Airway Mallampati: II  TM Distance: >3 FB     Dental   Pulmonary former smoker,    Pulmonary exam normal        Cardiovascular negative cardio ROS Normal cardiovascular exam     Neuro/Psych Seizures -,  negative psych ROS   GI/Hepatic Neg liver ROS, GERD  ,  Endo/Other  negative endocrine ROS  Renal/GU negative Renal ROS  negative genitourinary   Musculoskeletal negative musculoskeletal ROS (+)   Abdominal Normal abdominal exam  (+)   Peds negative pediatric ROS (+)  Hematology negative hematology ROS (+)   Anesthesia Other Findings Past Medical History: No date: Amnesia No date: Aphthae No date: Congestion-fibrosis syndrome No date: DD (diverticular disease) No date: GERD (gastroesophageal reflux disease) 12/2017: Seizures ([facility])     Comment:  passed out due to dehydration  Reproductive/Obstetrics                             Anesthesia Physical Anesthesia Plan  ASA: II  Anesthesia Plan: General   Post-op Pain Management:    Induction: Intravenous  PONV Risk Score and Plan:   Airway Management Planned: Nasal Cannula  Additional Equipment:   Intra-op Plan:   Post-operative Plan:   Informed Consent: I have reviewed the patients History and Physical, chart, labs and discussed the procedure including the risks, benefits and alternatives for the proposed anesthesia with the patient or authorized representative who has indicated his/her understanding and acceptance.   Dental advisory given  Plan Discussed with: CRNA and Surgeon  Anesthesia Plan Comments:         Anesthesia Quick Evaluation

## 2018-02-24 NOTE — Anesthesia Post-op Follow-up Note (Signed)
Anesthesia QCDR form completed.        

## 2018-02-25 ENCOUNTER — Encounter: Payer: Self-pay | Admitting: Unknown Physician Specialty

## 2018-02-25 NOTE — Anesthesia Postprocedure Evaluation (Signed)
Anesthesia Post Note  Patient: Alexis Lyons  Procedure(s) Performed: COLONOSCOPY WITH PROPOFOL (N/A )  Patient location during evaluation: Endoscopy Anesthesia Type: General Level of consciousness: awake and alert Pain management: pain level controlled Vital Signs Assessment: post-procedure vital signs reviewed and stable Respiratory status: spontaneous breathing, nonlabored ventilation, respiratory function stable and patient connected to nasal cannula oxygen Cardiovascular status: blood pressure returned to baseline and stable Postop Assessment: no apparent nausea or vomiting Anesthetic complications: no     Last Vitals:  Vitals:   02/24/18 1529 02/24/18 1539  BP: (!) 143/70 (!) 165/93  Pulse: (!) 108 (!) 57  Resp: 17 (!) 21  Temp:    SpO2: 100% 98%    Last Pain:  Vitals:   02/24/18 1358  TempSrc: Tympanic                 Martha Clan

## 2018-02-27 LAB — SURGICAL PATHOLOGY

## 2018-03-04 DIAGNOSIS — H02886 Meibomian gland dysfunction of left eye, unspecified eyelid: Secondary | ICD-10-CM | POA: Diagnosis not present

## 2018-03-04 DIAGNOSIS — H02883 Meibomian gland dysfunction of right eye, unspecified eyelid: Secondary | ICD-10-CM | POA: Diagnosis not present

## 2018-03-05 DIAGNOSIS — L239 Allergic contact dermatitis, unspecified cause: Secondary | ICD-10-CM | POA: Diagnosis not present

## 2018-03-05 DIAGNOSIS — L821 Other seborrheic keratosis: Secondary | ICD-10-CM | POA: Diagnosis not present

## 2018-03-06 ENCOUNTER — Telehealth: Payer: Self-pay

## 2018-03-06 ENCOUNTER — Ambulatory Visit (INDEPENDENT_AMBULATORY_CARE_PROVIDER_SITE_OTHER): Payer: PPO | Admitting: Family Medicine

## 2018-03-06 VITALS — BP 164/94 | HR 55 | Temp 97.6°F | Resp 16 | Wt 121.0 lb

## 2018-03-06 DIAGNOSIS — H547 Unspecified visual loss: Secondary | ICD-10-CM

## 2018-03-06 DIAGNOSIS — I10 Essential (primary) hypertension: Secondary | ICD-10-CM

## 2018-03-06 NOTE — Progress Notes (Signed)
Patient: Alexis Lyons Female    DOB: [birth year]   77 y.o.   MRN: [id number] Visit Date: 03/06/2018  Today's Provider: Lavon Paganini, MD   I, Martha Clan, CMA, am acting as scribe for Lavon Paganini, MD.  Chief Complaint  Patient presents with  . Blurred Vision   Subjective:    HPI   Pt states she was doing her taxes 3 days ago, and she noticed she did not have any vision to the left side, and her vision was blurred. She states she woke up the next morning, and her L eye was blood shot. She saw her eye doctor (Dr Ellin Mayhew in [place], optometrist).  Said nothing was wrong with eyes or vision.  He told her to take a baby aspirin.  This occurred about 10 years ago as well. She has a friend who is an optometrist, and he states pt had bitemporal hemi anopsia. Also only lasted for a day.  Her friend is worried that she has a pituitary gland lesion.  She also notes she had a syncopal episode on 12/23/2017 (see OV note). Her BP has been elevated since her colonoscopy on March 11th, according to her medical record. Pt has noticed left sided headache when she wakes up, which has been occurring for about 1 week.  Allergies  Allergen Reactions  . Codeine      Current Outpatient Medications:  .  fluocinonide (LIDEX) 0.05 % external solution, , Disp: , Rfl:  .  Ginseng 100 MG CAPS, every other day. , Disp: , Rfl:  .  meloxicam (MOBIC) 15 MG tablet, Take 15 mg by mouth., Disp: , Rfl:  .  zolpidem (AMBIEN) 5 MG tablet, Take 0.5 tablets (2.5 mg total) by mouth at bedtime as needed for sleep., Disp: 15 tablet, Rfl: 1  Review of Systems  Constitutional: Positive for appetite change (loss of appetite) and unexpected weight change (loss due to appetite change). Negative for activity change, chills, diaphoresis, fatigue and fever.  Eyes: Positive for visual disturbance.  Respiratory: Negative for shortness of breath.   Cardiovascular: Negative for chest pain, palpitations and leg  swelling.  Neurological: Positive for headaches (left sided headaches for 1 week). Negative for dizziness, speech difficulty, weakness, light-headedness and numbness.    Social History   Tobacco Use  . Smoking status: Former Smoker    Packs/day: 0.50    Years: 15.00    Pack years: 7.50    Types: Cigarettes    Last attempt to quit: 12/16/1985    Years since quitting: 32.2  . Smokeless tobacco: Never Used  Substance Use Topics  . Alcohol use: Yes    Alcohol/week: 4.2 oz    Types: 7 Glasses of wine per week    Comment: glass of wine with dinner   Objective:   BP (!) 164/94 (BP Location: Left Arm, Patient Position: Sitting, Cuff Size: Normal)   Pulse (!) 55   Temp 97.6 F (36.4 C) (Oral)   Resp 16   Wt 121 lb (54.9 kg)   SpO2 96%   BMI 21.10 kg/m  Vitals:   03/06/18 1047  BP: (!) 164/94  Pulse: (!) 55  Resp: 16  Temp: 97.6 F (36.4 C)  TempSrc: Oral  SpO2: 96%  Weight: 121 lb (54.9 kg)     Physical Exam  Constitutional: She is oriented to person, place, and time. She appears well-developed and well-nourished. No distress.  HENT:  Head: Normocephalic and atraumatic.  Right Ear: External ear  normal.  Left Ear: External ear normal.  Mouth/Throat: Oropharynx is clear and moist. No oropharyngeal exudate.  Eyes: Pupils are equal, round, and reactive to light. Conjunctivae and EOM are normal. Right eye exhibits no discharge. Left eye exhibits no discharge. No scleral icterus.  Neck: Neck supple. No thyromegaly present.  Cardiovascular: Normal rate, regular rhythm, normal heart sounds and intact distal pulses.  No murmur heard. Pulmonary/Chest: Effort normal and breath sounds normal. No respiratory distress. She has no wheezes. She has no rales.  Musculoskeletal: She exhibits no edema.  Lymphadenopathy:    She has no cervical adenopathy.  Neurological: She is alert and oriented to person, place, and time. She has normal strength. No cranial nerve deficit or sensory  deficit. She exhibits normal muscle tone. She displays a negative Romberg sign. Coordination and gait normal.  Skin: Skin is warm and dry. No rash noted.  Psychiatric: She has a normal mood and affect. Her behavior is normal.  Vitals reviewed.     Assessment & Plan:     1. Vision loss - left-sided hemianopsia with sudden onset 3 days ago, now resolved x2 days - does complain of some blurry vision remaining, but visual acuity intact - referral to Ophthalmology - concern for possible TIA vs tumor  - will obtain MRI brain - stroke precautions discussed - Ambulatory referral to Ophthalmology - MR Brain W Wo Contrast; Future  2. Essential hypertension - BP elevated at 2 different visits - discussed DASH diet and exercise - discussed possibility of BP meds as concern for possible TIA as above - patient declines - f/u in 2 months and consider anti-hypertensive again at that time   Return in about 2 months (around 05/06/2018) for BP f/u.   The entirety of the information documented in the History of Present Illness, Review of Systems and Physical Exam were personally obtained by me. Portions of this information were initially documented by Raquel Sarna Ratchford, CMA and reviewed by me for thoroughness and accuracy.    Virginia Crews, MD, MPH [facility] 03/06/2018 11:34 AM

## 2018-03-06 NOTE — Telephone Encounter (Signed)
Pt reports having an "Episode of blindness" last night. She denies other symptoms.  She does however report "Passing out several weeks ago".  She says this has happened before about nine years ago.    Currently she is completely asymptomatic and wishes to be seen in the clinic instead of an ER.   Thanks,   -Mickel Baas

## 2018-03-06 NOTE — Patient Instructions (Signed)
DASH Eating Plan DASH stands for "Dietary Approaches to Stop Hypertension." The DASH eating plan is a healthy eating plan that has been shown to reduce high blood pressure (hypertension). It may also reduce your risk for type 2 diabetes, heart disease, and stroke. The DASH eating plan may also help with weight loss. What are tips for following this plan? General guidelines  Avoid eating more than 2,300 mg (milligrams) of salt (sodium) a day. If you have hypertension, you may need to reduce your sodium intake to 1,500 mg a day.  Limit alcohol intake to no more than 1 drink a day for nonpregnant women and 2 drinks a day for men. One drink equals 12 oz of beer, 5 oz of wine, or 1 oz of hard liquor.  Work with your health care provider to maintain a healthy body weight or to lose weight. Ask what an ideal weight is for you.  Get at least 30 minutes of exercise that causes your heart to beat faster (aerobic exercise) most days of the week. Activities may include walking, swimming, or biking.  Work with your health care provider or diet and nutrition specialist (dietitian) to adjust your eating plan to your individual calorie needs. Reading food labels  Check food labels for the amount of sodium per serving. Choose foods with less than 5 percent of the Daily Value of sodium. Generally, foods with less than 300 mg of sodium per serving fit into this eating plan.  To find whole grains, look for the word "whole" as the first word in the ingredient list. Shopping  Buy products labeled as "low-sodium" or "no salt added."  Buy fresh foods. Avoid canned foods and premade or frozen meals. Cooking  Avoid adding salt when cooking. Use salt-free seasonings or herbs instead of table salt or sea salt. Check with your health care provider or pharmacist before using salt substitutes.  Do not fry foods. Cook foods using healthy methods such as baking, boiling, grilling, and broiling instead.  Cook with  heart-healthy oils, such as olive, canola, soybean, or sunflower oil. Meal planning   Eat a balanced diet that includes: ? 5 or more servings of fruits and vegetables each day. At each meal, try to fill half of your plate with fruits and vegetables. ? Up to 6-8 servings of whole grains each day. ? Less than 6 oz of lean meat, poultry, or fish each day. A 3-oz serving of meat is about the same size as a deck of cards. One egg equals 1 oz. ? 2 servings of low-fat dairy each day. ? A serving of nuts, seeds, or beans 5 times each week. ? Heart-healthy fats. Healthy fats called Omega-3 fatty acids are found in foods such as flaxseeds and coldwater fish, like sardines, salmon, and mackerel.  Limit how much you eat of the following: ? Canned or prepackaged foods. ? Food that is high in trans fat, such as fried foods. ? Food that is high in saturated fat, such as fatty meat. ? Sweets, desserts, sugary drinks, and other foods with added sugar. ? Full-fat dairy products.  Do not salt foods before eating.  Try to eat at least 2 vegetarian meals each week.  Eat more home-cooked food and less restaurant, buffet, and fast food.  When eating at a restaurant, ask that your food be prepared with less salt or no salt, if possible. What foods are recommended? The items listed may not be a complete list. Talk with your dietitian about what   dietary choices are best for you. Grains Whole-grain or whole-wheat bread. Whole-grain or whole-wheat pasta. Brown rice. Oatmeal. Quinoa. Bulgur. Whole-grain and low-sodium cereals. Pita bread. Low-fat, low-sodium crackers. Whole-wheat flour tortillas. Vegetables Fresh or frozen vegetables (raw, steamed, roasted, or grilled). Low-sodium or reduced-sodium tomato and vegetable juice. Low-sodium or reduced-sodium tomato sauce and tomato paste. Low-sodium or reduced-sodium canned vegetables. Fruits All fresh, dried, or frozen fruit. Canned fruit in natural juice (without  added sugar). Meat and other protein foods Skinless chicken or turkey. Ground chicken or turkey. Pork with fat trimmed off. Fish and seafood. Egg whites. Dried beans, peas, or lentils. Unsalted nuts, nut butters, and seeds. Unsalted canned beans. Lean cuts of beef with fat trimmed off. Low-sodium, lean deli meat. Dairy Low-fat (1%) or fat-free (skim) milk. Fat-free, low-fat, or reduced-fat cheeses. Nonfat, low-sodium ricotta or cottage cheese. Low-fat or nonfat yogurt. Low-fat, low-sodium cheese. Fats and oils Soft margarine without trans fats. Vegetable oil. Low-fat, reduced-fat, or light mayonnaise and salad dressings (reduced-sodium). Canola, safflower, olive, soybean, and sunflower oils. Avocado. Seasoning and other foods Herbs. Spices. Seasoning mixes without salt. Unsalted popcorn and pretzels. Fat-free sweets. What foods are not recommended? The items listed may not be a complete list. Talk with your dietitian about what dietary choices are best for you. Grains Baked goods made with fat, such as croissants, muffins, or some breads. Dry pasta or rice meal packs. Vegetables Creamed or fried vegetables. Vegetables in a cheese sauce. Regular canned vegetables (not low-sodium or reduced-sodium). Regular canned tomato sauce and paste (not low-sodium or reduced-sodium). Regular tomato and vegetable juice (not low-sodium or reduced-sodium). Pickles. Olives. Fruits Canned fruit in a light or heavy syrup. Fried fruit. Fruit in cream or butter sauce. Meat and other protein foods Fatty cuts of meat. Ribs. Fried meat. Bacon. Sausage. Bologna and other processed lunch meats. Salami. Fatback. Hotdogs. Bratwurst. Salted nuts and seeds. Canned beans with added salt. Canned or smoked fish. Whole eggs or egg yolks. Chicken or turkey with skin. Dairy Whole or 2% milk, cream, and half-and-half. Whole or full-fat cream cheese. Whole-fat or sweetened yogurt. Full-fat cheese. Nondairy creamers. Whipped toppings.  Processed cheese and cheese spreads. Fats and oils Butter. Stick margarine. Lard. Shortening. Ghee. Bacon fat. Tropical oils, such as coconut, palm kernel, or palm oil. Seasoning and other foods Salted popcorn and pretzels. Onion salt, garlic salt, seasoned salt, table salt, and sea salt. Worcestershire sauce. Tartar sauce. Barbecue sauce. Teriyaki sauce. Soy sauce, including reduced-sodium. Steak sauce. Canned and packaged gravies. Fish sauce. Oyster sauce. Cocktail sauce. Horseradish that you find on the shelf. Ketchup. Mustard. Meat flavorings and tenderizers. Bouillon cubes. Hot sauce and Tabasco sauce. Premade or packaged marinades. Premade or packaged taco seasonings. Relishes. Regular salad dressings. Where to find more information:  National Heart, Lung, and Blood Institute: www.nhlbi.nih.gov  American Heart Association: www.heart.org Summary  The DASH eating plan is a healthy eating plan that has been shown to reduce high blood pressure (hypertension). It may also reduce your risk for type 2 diabetes, heart disease, and stroke.  With the DASH eating plan, you should limit salt (sodium) intake to 2,300 mg a day. If you have hypertension, you may need to reduce your sodium intake to 1,500 mg a day.  When on the DASH eating plan, aim to eat more fresh fruits and vegetables, whole grains, lean proteins, low-fat dairy, and heart-healthy fats.  Work with your health care provider or diet and nutrition specialist (dietitian) to adjust your eating plan to your individual   calorie needs. This information is not intended to replace advice given to you by your health care provider. Make sure you discuss any questions you have with your health care provider. Document Released: 11/22/2011 Document Revised: 11/26/2016 Document Reviewed: 11/26/2016 Elsevier Interactive Patient Education  2018 Elsevier Inc.  

## 2018-03-07 ENCOUNTER — Other Ambulatory Visit
Admission: RE | Admit: 2018-03-07 | Discharge: 2018-03-07 | Disposition: A | Discharge status: Home / Self Care | Payer: PPO | Source: Ambulatory Visit | Attending: Ophthalmology | Admitting: Ophthalmology

## 2018-03-07 DIAGNOSIS — H547 Unspecified visual loss: Secondary | ICD-10-CM | POA: Insufficient documentation

## 2018-03-07 DIAGNOSIS — G43109 Migraine with aura, not intractable, without status migrainosus: Secondary | ICD-10-CM | POA: Insufficient documentation

## 2018-03-07 LAB — CBC WITH DIFFERENTIAL/PLATELET
BASOS ABS: 0.1 10*3/uL (ref 0–0.1)
BASOS PCT: 1 %
EOS ABS: 0.2 10*3/uL (ref 0–0.7)
EOS PCT: 3 %
HCT: 42.5 % (ref 35.0–47.0)
Hemoglobin: 14.6 g/dL (ref 12.0–16.0)
LYMPHS PCT: 28 %
Lymphs Abs: 1.7 10*3/uL (ref 1.0–3.6)
MCH: 30.4 pg (ref 26.0–34.0)
MCHC: 34.5 g/dL (ref 32.0–36.0)
MCV: 88 fL (ref 80.0–100.0)
MONO ABS: 0.4 10*3/uL (ref 0.2–0.9)
Monocytes Relative: 7 %
Neutro Abs: 3.9 10*3/uL (ref 1.4–6.5)
Neutrophils Relative %: 61 %
Platelets: 242 10*3/uL (ref 150–440)
RBC: 4.82 MIL/uL (ref 3.80–5.20)
RDW: 13.2 % (ref 11.5–14.5)
WBC: 6.3 10*3/uL (ref 3.6–11.0)

## 2018-03-07 LAB — SEDIMENTATION RATE: SED RATE: 2 mm/h (ref 0–30)

## 2018-03-08 LAB — C-REACTIVE PROTEIN: CRP: <0.8 mg/dL (ref <1.0)

## 2018-03-13 ENCOUNTER — Ambulatory Visit
Admission: RE | Admit: 2018-03-13 | Discharge: 2018-03-13 | Disposition: A | Discharge status: Home / Self Care | Payer: PPO | Source: Ambulatory Visit | Attending: Family Medicine | Admitting: Family Medicine

## 2018-03-13 DIAGNOSIS — R9089 Other abnormal findings on diagnostic imaging of central nervous system: Secondary | ICD-10-CM | POA: Insufficient documentation

## 2018-03-13 DIAGNOSIS — H547 Unspecified visual loss: Secondary | ICD-10-CM | POA: Diagnosis not present

## 2018-03-13 DIAGNOSIS — G43909 Migraine, unspecified, not intractable, without status migrainosus: Secondary | ICD-10-CM | POA: Diagnosis not present

## 2018-03-13 LAB — POCT I-STAT CREATININE: Creatinine, Ser: 0.8 mg/dL (ref 0.44–1.00)

## 2018-03-13 MED ORDER — GADOBENATE DIMEGLUMINE 529 MG/ML IV SOLN
10.0000 mL | Freq: Once | INTRAVENOUS | Status: AC | PRN
  Administered 2018-03-13: 10 mL via INTRAVENOUS

## 2018-03-14 ENCOUNTER — Telehealth: Payer: Self-pay

## 2018-03-14 MED ORDER — ASPIRIN EC 81 MG PO TBEC: 81.0000 mg | DELAYED_RELEASE_TABLET | Freq: Every day | ORAL | Status: DC

## 2018-03-14 NOTE — Telephone Encounter (Signed)
-----   Message from Virginia Crews, MD sent at 03/14/2018 10:50 AM EDT ----- No acute abnormalities.  No tumor, no recent stroke.  There are chronic changes that suggest small areas of blood flow loss over the year and this has worsened since 2009.  Patient was supposed to get screening labs after her physical.  She should get these as we need to make sure that cholesterol is well controlled to decrease stroke risk. May also start daily baby aspirin to decrease risk.  If patient has other episodes of vision loss, slurred speech, weakness/numbness on one side of body, etc, she needs to be seen urgently as these could be signs of a stroke.  Virginia Crews, MD, MPH [facility] 03/14/2018 10:50 AM

## 2018-03-14 NOTE — Telephone Encounter (Signed)
Pt advised of results. She agrees to start taking baby ASA and get her labs done. ASA added to med list.

## 2018-03-18 ENCOUNTER — Other Ambulatory Visit: Payer: Self-pay | Admitting: Family Medicine

## 2018-03-18 DIAGNOSIS — Z0000 Encounter for general adult medical examination without abnormal findings: Secondary | ICD-10-CM | POA: Diagnosis not present

## 2018-03-19 ENCOUNTER — Telehealth: Payer: Self-pay

## 2018-03-19 LAB — COMPREHENSIVE METABOLIC PANEL
A/G RATIO: 1.9 (ref 1.2–2.2)
ALBUMIN: 4.4 g/dL (ref 3.5–4.8)
ALT: 23 IU/L (ref 0–32)
AST: 20 IU/L (ref 0–40)
Alkaline Phosphatase: 83 IU/L (ref 39–117)
BUN / CREAT RATIO: 17 (ref 12–28)
BUN: 15 mg/dL (ref 8–27)
Bilirubin Total: 0.5 mg/dL (ref 0.0–1.2)
CALCIUM: 9.4 mg/dL (ref 8.7–10.3)
CHLORIDE: 102 mmol/L (ref 96–106)
CO2: 25 mmol/L (ref 20–29)
Creatinine, Ser: 0.86 mg/dL (ref 0.57–1.00)
GFR, EST AFRICAN AMERICAN: 76 mL/min/{1.73_m2} (ref >59)
GFR, EST NON AFRICAN AMERICAN: 66 mL/min/{1.73_m2} (ref >59)
Globulin, Total: 2.3 g/dL (ref 1.5–4.5)
Glucose: 88 mg/dL (ref 65–99)
POTASSIUM: 3.9 mmol/L (ref 3.5–5.2)
Sodium: 143 mmol/L (ref 134–144)
TOTAL PROTEIN: 6.7 g/dL (ref 6.0–8.5)

## 2018-03-19 LAB — CBC WITH DIFFERENTIAL/PLATELET
BASOS: 1 %
Basophils Absolute: 0.1 10*3/uL (ref 0.0–0.2)
EOS (ABSOLUTE): 0.3 10*3/uL (ref 0.0–0.4)
EOS: 6 %
HEMOGLOBIN: 15.1 g/dL (ref 11.1–15.9)
Hematocrit: 44.7 % (ref 34.0–46.6)
IMMATURE GRANS (ABS): 0 10*3/uL (ref 0.0–0.1)
IMMATURE GRANULOCYTES: 0 %
LYMPHS: 41 %
Lymphocytes Absolute: 1.9 10*3/uL (ref 0.7–3.1)
MCH: 30.3 pg (ref 26.6–33.0)
MCHC: 33.8 g/dL (ref 31.5–35.7)
MCV: 90 fL (ref 79–97)
MONOCYTES: 7 %
Monocytes Absolute: 0.3 10*3/uL (ref 0.1–0.9)
NEUTROS ABS: 2.1 10*3/uL (ref 1.4–7.0)
Neutrophils: 45 %
Platelets: 238 10*3/uL (ref 150–379)
RBC: 4.99 x10E6/uL (ref 3.77–5.28)
RDW: 13.4 % (ref 12.3–15.4)
WBC: 4.6 10*3/uL (ref 3.4–10.8)

## 2018-03-19 LAB — LIPID PANEL W/O CHOL/HDL RATIO
Cholesterol, Total: 165 mg/dL (ref 100–199)
HDL: 67 mg/dL (ref >39)
LDL Calculated: 89 mg/dL (ref 0–99)
Triglycerides: 44 mg/dL (ref 0–149)
VLDL CHOLESTEROL CAL: 9 mg/dL (ref 5–40)

## 2018-03-19 NOTE — Telephone Encounter (Signed)
Pt advised.

## 2018-03-19 NOTE — Telephone Encounter (Signed)
-----   Message from Virginia Crews, MD sent at 03/19/2018  8:41 AM EDT ----- Normal blood counts, kidney function, liver function, electrolytes, blood sugar, cholesterol.  Virginia Crews, MD, MPH [facility] 03/19/2018 8:41 AM

## 2018-05-05 ENCOUNTER — Ambulatory Visit: Payer: Self-pay | Admitting: Family Medicine

## 2018-05-21 DIAGNOSIS — H02881 Meibomian gland dysfunction right upper eyelid: Secondary | ICD-10-CM | POA: Diagnosis not present

## 2018-05-21 DIAGNOSIS — H00021 Hordeolum internum right upper eyelid: Secondary | ICD-10-CM | POA: Diagnosis not present

## 2018-05-21 DIAGNOSIS — H02884 Meibomian gland dysfunction left upper eyelid: Secondary | ICD-10-CM | POA: Diagnosis not present

## 2018-05-26 ENCOUNTER — Encounter: Payer: Self-pay | Admitting: Family Medicine

## 2018-05-26 ENCOUNTER — Ambulatory Visit (INDEPENDENT_AMBULATORY_CARE_PROVIDER_SITE_OTHER): Payer: PPO | Admitting: Family Medicine

## 2018-05-26 VITALS — BP 154/92 | HR 69 | Temp 98.1°F | Resp 16 | Wt 119.0 lb

## 2018-05-26 DIAGNOSIS — F5102 Adjustment insomnia: Secondary | ICD-10-CM

## 2018-05-26 DIAGNOSIS — R140 Abdominal distension (gaseous): Secondary | ICD-10-CM

## 2018-05-26 DIAGNOSIS — K219 Gastro-esophageal reflux disease without esophagitis: Secondary | ICD-10-CM

## 2018-05-26 MED ORDER — SUCRALFATE 1 G PO TABS: 1.0000 g | ORAL_TABLET | Freq: Three times a day (TID) | ORAL | 1 refills | Status: DC

## 2018-05-26 MED ORDER — ZOLPIDEM TARTRATE 5 MG PO TABS: 2.5000 mg | ORAL_TABLET | Freq: Every evening | ORAL | 1 refills | Status: DC | PRN
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
## 2018-05-26 NOTE — Patient Instructions (Signed)
Gastritis, Adult Gastritis is inflammation of the stomach. There are two kinds of gastritis:  Acute gastritis. This kind develops suddenly.  Chronic gastritis. This kind lasts for a long time.  Gastritis happens when the lining of the stomach becomes weak or gets damaged. Without treatment, gastritis can lead to stomach bleeding and ulcers. What are the causes? This condition may be caused by:  An infection.  Drinking too much alcohol.  Certain medicines.  Having too much acid in the stomach.  A disease of the intestines or stomach.  Stress.  What are the signs or symptoms? Symptoms of this condition include:  Pain or a burning in the upper abdomen.  Nausea.  Vomiting.  An uncomfortable feeling of fullness after eating.  In some cases, there are no symptoms. How is this diagnosed? This condition may be diagnosed with:  A description of your symptoms.  A physical exam.  Tests. These can include: ? Blood tests. ? Stool tests. ? A test in which a thin, flexible instrument with a light and camera on the end is passed down the esophagus and into the stomach (upper endoscopy). ? A test in which a sample of tissue is taken for testing (biopsy).  How is this treated? This condition may be treated with medicines. If the condition is caused by a bacterial infection, you may be given antibiotic medicines. If it is caused by too much acid in the stomach, you may get medicines called H2 blockers, proton pump inhibitors, or antacids. Treatment may also involve stopping the use of certain medicines, such as aspirin, ibuprofen, or other nonsteroidal anti-inflammatory drugs (NSAIDs). Follow these instructions at home:  Take over-the-counter and prescription medicines only as told by your health care provider.  If you were prescribed an antibiotic, take it as told by your health care provider. Do not stop taking the antibiotic even if you start to feel better.  Drink enough  fluid to keep your urine clear or pale yellow.  Eat small, frequent meals instead of large meals. Contact a health care provider if:  Your symptoms get worse.  Your symptoms return after treatment. Get help right away if:  You vomit blood or material that looks like coffee grounds.  You have black or dark red stools.  You are unable to keep fluids down.  Your abdominal pain gets worse.  You have a fever.  You do not feel better after 1 week. This information is not intended to replace advice given to you by your health care provider. Make sure you discuss any questions you have with your health care provider. Document Released: 11/27/2001 Document Revised: 08/01/2016 Document Reviewed: 08/27/2015 Elsevier Interactive Patient Education  2018 [employer]  

## 2018-07-09 ENCOUNTER — Encounter: Payer: Self-pay | Admitting: Family Medicine

## 2018-07-09 ENCOUNTER — Ambulatory Visit (INDEPENDENT_AMBULATORY_CARE_PROVIDER_SITE_OTHER): Payer: PPO | Admitting: Family Medicine

## 2018-07-09 VITALS — BP 177/95 | HR 73 | Temp 98.2°F | Resp 16 | Wt 121.2 lb

## 2018-07-09 DIAGNOSIS — R030 Elevated blood-pressure reading, without diagnosis of hypertension: Secondary | ICD-10-CM | POA: Diagnosis not present

## 2018-07-09 DIAGNOSIS — S39012A Strain of muscle, fascia and tendon of lower back, initial encounter: Secondary | ICD-10-CM

## 2018-07-09 DIAGNOSIS — I10 Essential (primary) hypertension: Secondary | ICD-10-CM | POA: Insufficient documentation

## 2018-07-09 MED ORDER — CYCLOBENZAPRINE HCL 5 MG PO TABS: 5.0000 mg | ORAL_TABLET | Freq: Three times a day (TID) | ORAL | 0 refills | Status: DC | PRN

## 2018-07-09 MED ORDER — AMLODIPINE BESYLATE 2.5 MG PO TABS: 2.5000 mg | ORAL_TABLET | Freq: Every day | ORAL | 2 refills | Status: DC
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
## 2018-07-09 NOTE — Patient Instructions (Signed)
DASH Eating Plan DASH stands for "Dietary Approaches to Stop Hypertension." The DASH eating plan is a healthy eating plan that has been shown to reduce high blood pressure (hypertension). It may also reduce your risk for type 2 diabetes, heart disease, and stroke. The DASH eating plan may also help with weight loss. What are tips for following this plan? General guidelines  Avoid eating more than 2,300 mg (milligrams) of salt (sodium) a day. If you have hypertension, you may need to reduce your sodium intake to 1,500 mg a day.  Limit alcohol intake to no more than 1 drink a day for nonpregnant women and 2 drinks a day for men. One drink equals 12 oz of beer, 5 oz of wine, or 1 oz of hard liquor.  Work with your health care provider to maintain a healthy body weight or to lose weight. Ask what an ideal weight is for you.  Get at least 30 minutes of exercise that causes your heart to beat faster (aerobic exercise) most days of the week. Activities may include walking, swimming, or biking.  Work with your health care provider or diet and nutrition specialist (dietitian) to adjust your eating plan to your individual calorie needs. Reading food labels  Check food labels for the amount of sodium per serving. Choose foods with less than 5 percent of the Daily Value of sodium. Generally, foods with less than 300 mg of sodium per serving fit into this eating plan.  To find whole grains, look for the word "whole" as the first word in the ingredient list. Shopping  Buy products labeled as "low-sodium" or "no salt added."  Buy fresh foods. Avoid canned foods and premade or frozen meals. Cooking  Avoid adding salt when cooking. Use salt-free seasonings or herbs instead of table salt or sea salt. Check with your health care provider or pharmacist before using salt substitutes.  Do not fry foods. Cook foods using healthy methods such as baking, boiling, grilling, and broiling instead.  Cook with  heart-healthy oils, such as olive, canola, soybean, or sunflower oil. Meal planning   Eat a balanced diet that includes: ? 5 or more servings of fruits and vegetables each day. At each meal, try to fill half of your plate with fruits and vegetables. ? Up to 6-8 servings of whole grains each day. ? Less than 6 oz of lean meat, poultry, or fish each day. A 3-oz serving of meat is about the same size as a deck of cards. One egg equals 1 oz. ? 2 servings of low-fat dairy each day. ? A serving of nuts, seeds, or beans 5 times each week. ? Heart-healthy fats. Healthy fats called Omega-3 fatty acids are found in foods such as flaxseeds and coldwater fish, like sardines, salmon, and mackerel.  Limit how much you eat of the following: ? Canned or prepackaged foods. ? Food that is high in trans fat, such as fried foods. ? Food that is high in saturated fat, such as fatty meat. ? Sweets, desserts, sugary drinks, and other foods with added sugar. ? Full-fat dairy products.  Do not salt foods before eating.  Try to eat at least 2 vegetarian meals each week.  Eat more home-cooked food and less restaurant, buffet, and fast food.  When eating at a restaurant, ask that your food be prepared with less salt or no salt, if possible. What foods are recommended? The items listed may not be a complete list. Talk with your dietitian about what   dietary choices are best for you. Grains Whole-grain or whole-wheat bread. Whole-grain or whole-wheat pasta. Brown rice. Oatmeal. Quinoa. Bulgur. Whole-grain and low-sodium cereals. Pita bread. Low-fat, low-sodium crackers. Whole-wheat flour tortillas. Vegetables Fresh or frozen vegetables (raw, steamed, roasted, or grilled). Low-sodium or reduced-sodium tomato and vegetable juice. Low-sodium or reduced-sodium tomato sauce and tomato paste. Low-sodium or reduced-sodium canned vegetables. Fruits All fresh, dried, or frozen fruit. Canned fruit in natural juice (without  added sugar). Meat and other protein foods Skinless chicken or turkey. Ground chicken or turkey. Pork with fat trimmed off. Fish and seafood. Egg whites. Dried beans, peas, or lentils. Unsalted nuts, nut butters, and seeds. Unsalted canned beans. Lean cuts of beef with fat trimmed off. Low-sodium, lean deli meat. Dairy Low-fat (1%) or fat-free (skim) milk. Fat-free, low-fat, or reduced-fat cheeses. Nonfat, low-sodium ricotta or cottage cheese. Low-fat or nonfat yogurt. Low-fat, low-sodium cheese. Fats and oils Soft margarine without trans fats. Vegetable oil. Low-fat, reduced-fat, or light mayonnaise and salad dressings (reduced-sodium). Canola, safflower, olive, soybean, and sunflower oils. Avocado. Seasoning and other foods Herbs. Spices. Seasoning mixes without salt. Unsalted popcorn and pretzels. Fat-free sweets. What foods are not recommended? The items listed may not be a complete list. Talk with your dietitian about what dietary choices are best for you. Grains Baked goods made with fat, such as croissants, muffins, or some breads. Dry pasta or rice meal packs. Vegetables Creamed or fried vegetables. Vegetables in a cheese sauce. Regular canned vegetables (not low-sodium or reduced-sodium). Regular canned tomato sauce and paste (not low-sodium or reduced-sodium). Regular tomato and vegetable juice (not low-sodium or reduced-sodium). Pickles. Olives. Fruits Canned fruit in a light or heavy syrup. Fried fruit. Fruit in cream or butter sauce. Meat and other protein foods Fatty cuts of meat. Ribs. Fried meat. Bacon. Sausage. Bologna and other processed lunch meats. Salami. Fatback. Hotdogs. Bratwurst. Salted nuts and seeds. Canned beans with added salt. Canned or smoked fish. Whole eggs or egg yolks. Chicken or turkey with skin. Dairy Whole or 2% milk, cream, and half-and-half. Whole or full-fat cream cheese. Whole-fat or sweetened yogurt. Full-fat cheese. Nondairy creamers. Whipped toppings.  Processed cheese and cheese spreads. Fats and oils Butter. Stick margarine. Lard. Shortening. Ghee. Bacon fat. Tropical oils, such as coconut, palm kernel, or palm oil. Seasoning and other foods Salted popcorn and pretzels. Onion salt, garlic salt, seasoned salt, table salt, and sea salt. Worcestershire sauce. Tartar sauce. Barbecue sauce. Teriyaki sauce. Soy sauce, including reduced-sodium. Steak sauce. Canned and packaged gravies. Fish sauce. Oyster sauce. Cocktail sauce. Horseradish that you find on the shelf. Ketchup. Mustard. Meat flavorings and tenderizers. Bouillon cubes. Hot sauce and Tabasco sauce. Premade or packaged marinades. Premade or packaged taco seasonings. Relishes. Regular salad dressings. Where to find more information:  National Heart, Lung, and Blood Institute: www.nhlbi.nih.gov  American Heart Association: www.heart.org Summary  The DASH eating plan is a healthy eating plan that has been shown to reduce high blood pressure (hypertension). It may also reduce your risk for type 2 diabetes, heart disease, and stroke.  With the DASH eating plan, you should limit salt (sodium) intake to 2,300 mg a day. If you have hypertension, you may need to reduce your sodium intake to 1,500 mg a day.  When on the DASH eating plan, aim to eat more fresh fruits and vegetables, whole grains, lean proteins, low-fat dairy, and heart-healthy fats.  Work with your health care provider or diet and nutrition specialist (dietitian) to adjust your eating plan to your individual   calorie needs. This information is not intended to replace advice given to you by your health care provider. Make sure you discuss any questions you have with your health care provider. Document Released: 11/22/2011 Document Revised: 11/26/2016 Document Reviewed: 11/26/2016 Elsevier Interactive Patient Education  2018 Elsevier Inc.  

## 2018-07-24 DIAGNOSIS — L923 Foreign body granuloma of the skin and subcutaneous tissue: Secondary | ICD-10-CM | POA: Diagnosis not present

## 2018-08-20 ENCOUNTER — Ambulatory Visit: Payer: Self-pay | Admitting: Family Medicine

## 2018-10-02 ENCOUNTER — Other Ambulatory Visit: Payer: Self-pay | Admitting: Family Medicine

## 2018-10-02 DIAGNOSIS — F5102 Adjustment insomnia: Secondary | ICD-10-CM

## 2018-10-02 MED ORDER — ZOLPIDEM TARTRATE 5 MG PO TABS: 2.5000 mg | ORAL_TABLET | Freq: Every evening | ORAL | 1 refills | Status: DC | PRN

## 2018-10-02 NOTE — Telephone Encounter (Signed)
Pt needing a refill on: zolpidem (AMBIEN) 5 MG tablet   Please refill at:  [place], [place]. [phone] (Phone) [phone] (Fax)   [place] going out [place] needing this filled asap.  Thanks, [employer]

## 2018-10-31 IMAGING — MR MR HEAD WO/W CM
10 of 12 series · 30 of 48 positions shown · IV contrast (multihance)
Comparison: 06/05/2008.

CLINICAL DATA: Visual loss in LEFT eye, symptoms have now resolved.
Diagnosed with migraines.

EXAM:
MRI HEAD WITHOUT AND WITH CONTRAST
TECHNIQUE: Multiplanar, multiecho pulse sequences of the brain and surrounding
structures were obtained without and with intravenous contrast.
CONTRAST:  10mL MULTIHANCE GADOBENATE DIMEGLUMINE 529 MG/ML IV SOLN

[Series 2: T1 · sagittal · 5.0mm · 0.47mm/px · 2 of 25 slices shown (1 of 2)]
[im 1/25]
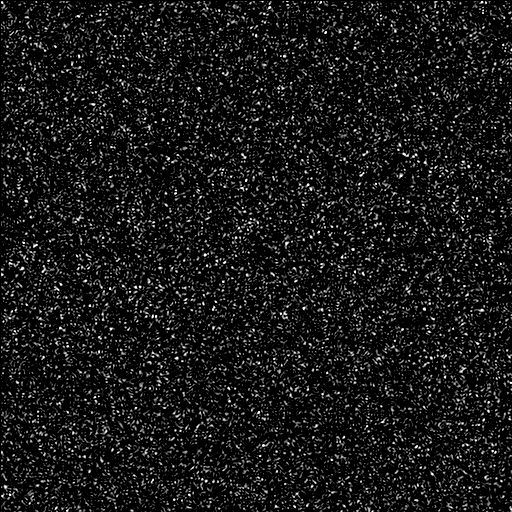
[im 25/25]
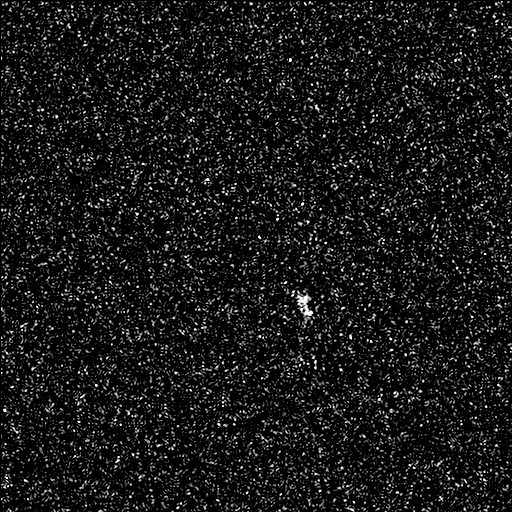

[Series 4: DWI · axial · 3.0mm · 0.94mm/px · z∈[-67,+80]mm · 3 of 50 slices shown (1 of 2)]
[im 1/50]
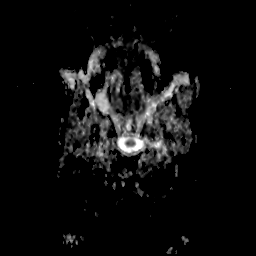
[im 25/50]
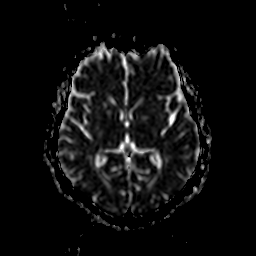
[im 50/50]
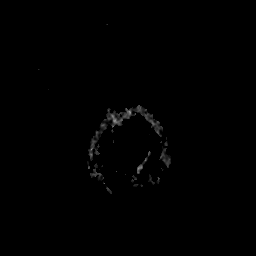

[Series 7: DWI · coronal · 5.0mm · 1.80mm/px · 3 of 39 slices shown (2 of 2)]
[im 1/39]
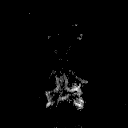
[im 20/39]
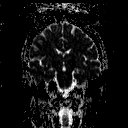
[im 39/39]
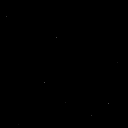

[Series 9: T2 · axial · 5.0mm · 0.45mm/px · z∈[-71,+83]mm · 2 of 23 slices shown (1 of 2)]
[im 1/23]
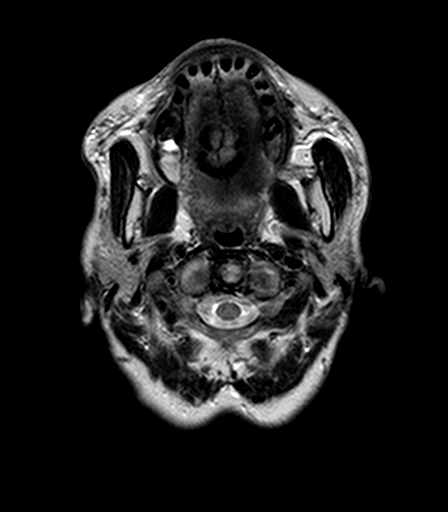
[im 23/23]
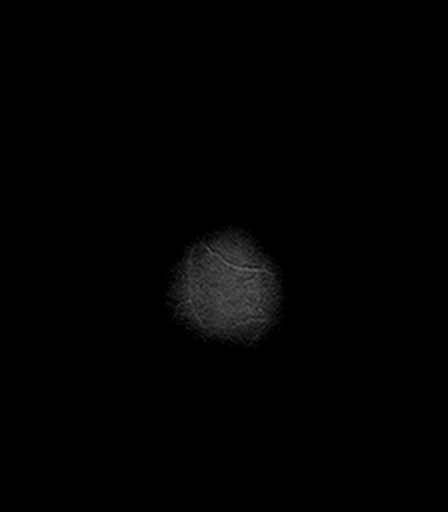

[Series 10: FLAIR · axial · 3.0mm · 0.90mm/px · z∈[-67,+80]mm · 3 of 50 slices shown]
[im 1/50]
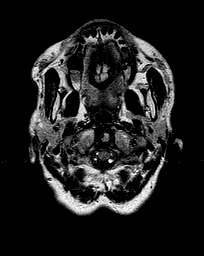
[im 25/50]
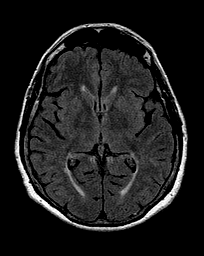
[im 50/50]
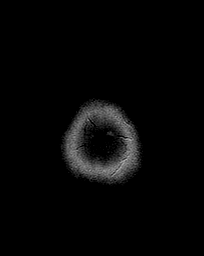

[Series 11: T2 · axial · 5.0mm · 0.45mm/px · z∈[-71,+83]mm · 2 of 23 slices shown (2 of 2)]
[im 1/23]
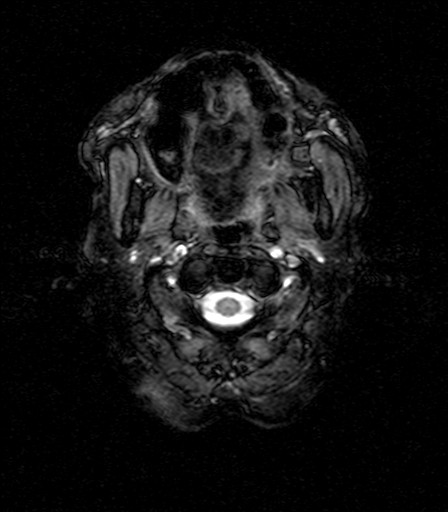
[im 23/23]
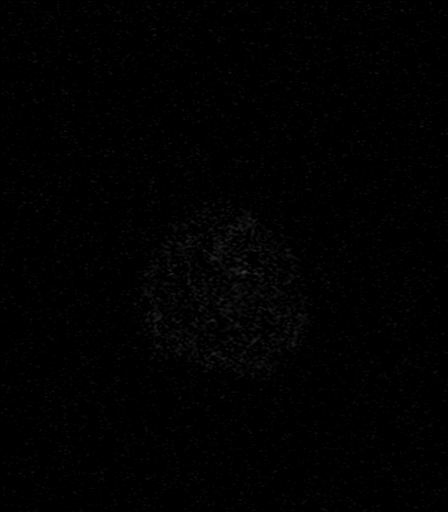

[Series 12: T1 · axial · 1.0mm · 0.50mm/px · z∈[-86,-39]mm · 3 of 176 slices shown (2 of 2)]
[im 1/176]
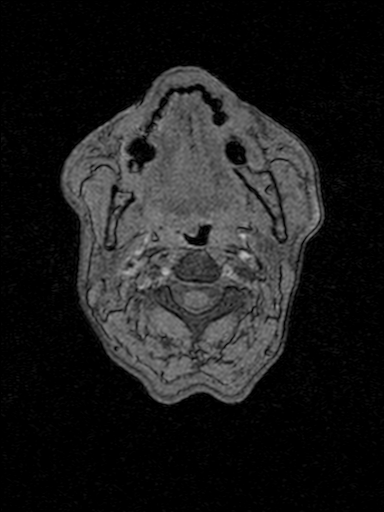
[im 32/176]
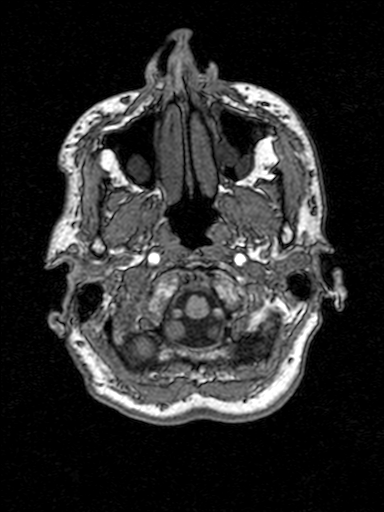
[im 48/176]
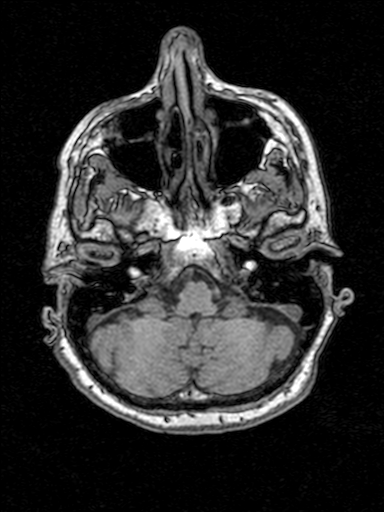

[Series 13: T1 post-contrast · coronal · 5.0mm · 0.45mm/px · 2 of 27 slices shown (1 of 2)]
[im 1/27]
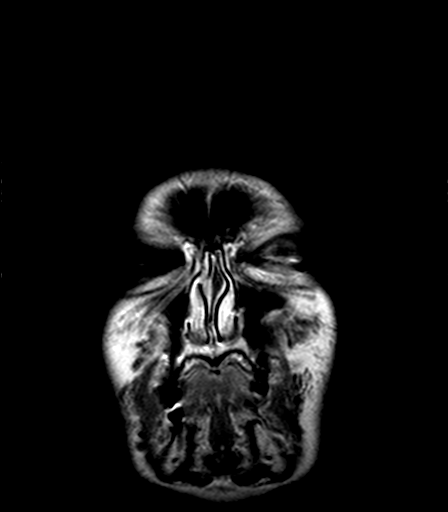
[im 27/27]
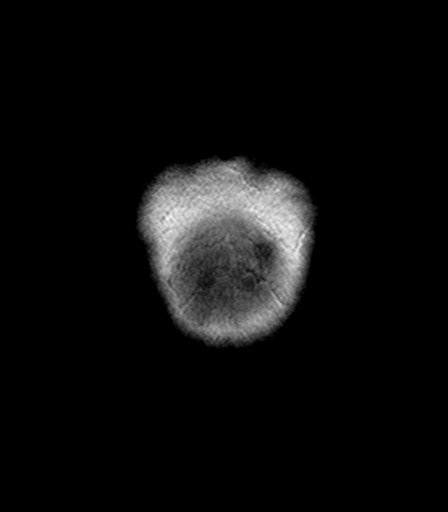

[Series 14: T2 post-contrast · coronal · 5.0mm · 0.45mm/px · 2 of 27 slices shown]
[im 1/27]
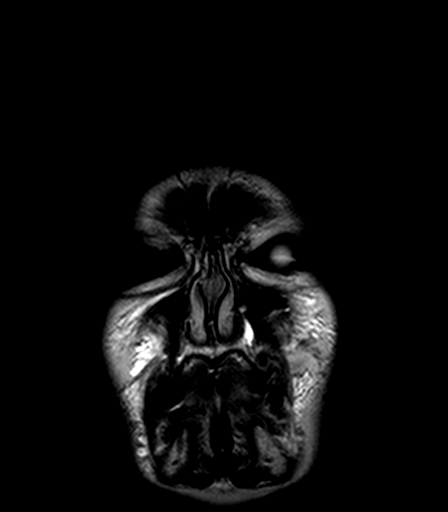
[im 27/27]
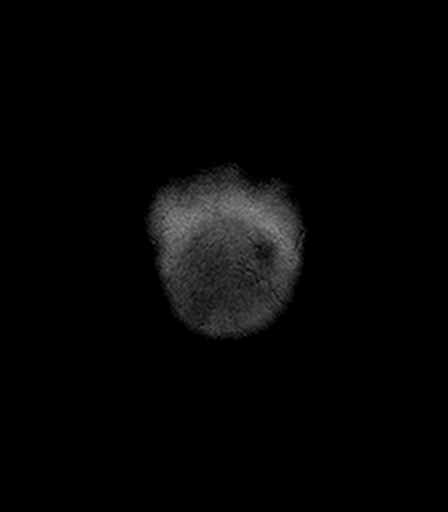

[Series 15: T1 post-contrast · axial · 1.0mm · 0.50mm/px · z∈[-86,+89]mm · 8 of 176 slices shown (2 of 2)]
[im 1/176]
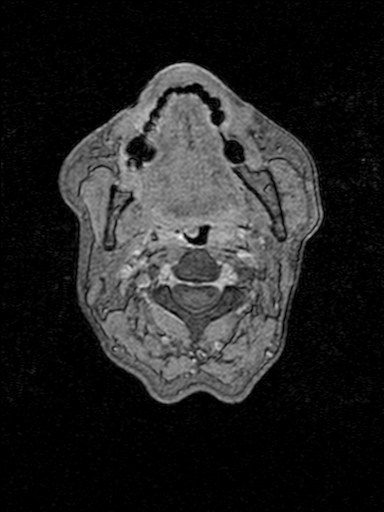
[im 32/176]
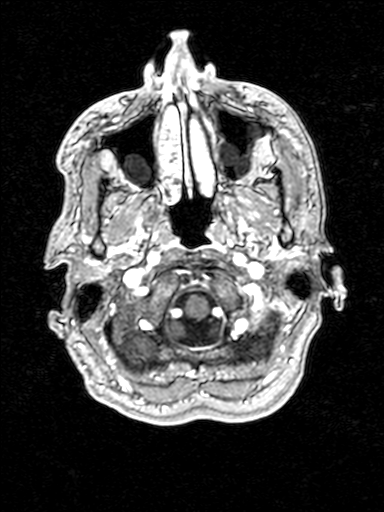
[im 48/176]
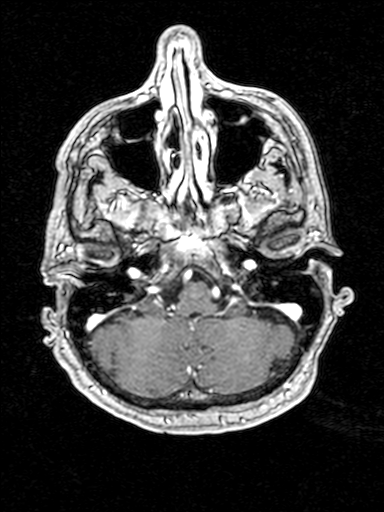
[im 80/176]
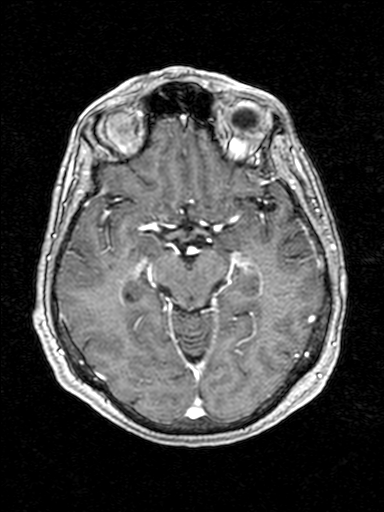
[im 96/176]
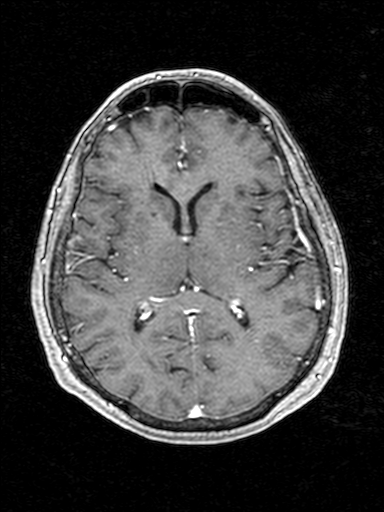
[im 128/176]
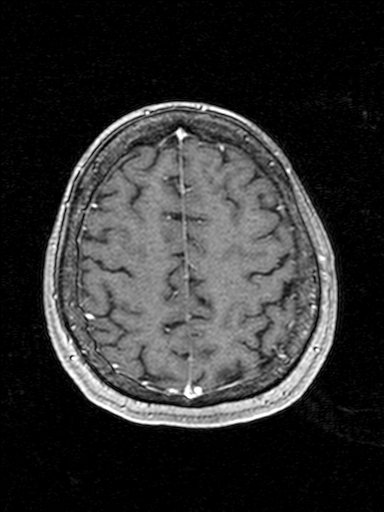
[im 144/176]
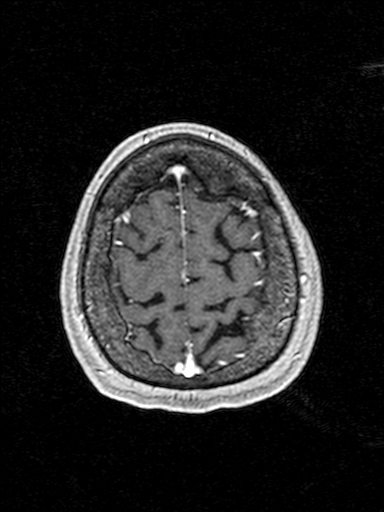
[im 176/176]
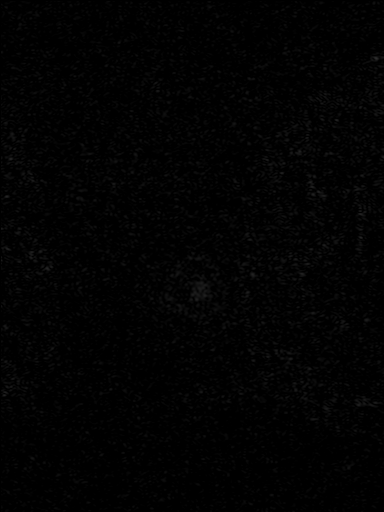

[30 of 48 positions shown; findings below may reference images not displayed]

FINDINGS: Brain: No evidence for acute infarction, hemorrhage, mass lesion,
hydrocephalus, or extra-axial fluid. Normal for age cerebral volume.
Mild subcortical and periventricular T2 and FLAIR hyperintensities,
likely chronic microvascular ischemic change. Normal-appearing
temporal lobes.

Post infusion, no abnormal enhancement of the brain or meninges.

Vascular: Flow voids are maintained throughout the carotid, basilar,
and vertebral arteries. There are no areas of chronic hemorrhage.

Skull and upper cervical spine: Unremarkable visualized calvarium,
skullbase, and cervical vertebrae. Pituitary, pineal, cerebellar
tonsils unremarkable. No upper cervical cord lesions.

Sinuses/Orbits: No orbital masses or proptosis. Globes appear
symmetric. Sinuses appear well aerated, without evidence for
air-fluid level. BILATERAL middle turbinate concha bullosa, greater
on the RIGHT. Chronic appearing retention cysts in the paranasal
sinuses. BILATERAL cataract extraction.

Other: None.
IMPRESSION: Subcortical and periventricular white matter signal abnormality most
consistent with chronic microvascular ischemic change has progressed
since 2331. Overall, the features are not favored to represent
complicated migraine.

No acute intracranial abnormality. No abnormal postcontrast
enhancement.

## 2018-11-11 ENCOUNTER — Other Ambulatory Visit: Payer: Self-pay | Admitting: Family Medicine

## 2018-11-11 DIAGNOSIS — F5102 Adjustment insomnia: Secondary | ICD-10-CM

## 2018-11-18 ENCOUNTER — Telehealth: Payer: Self-pay | Admitting: Family Medicine

## 2018-11-18 NOTE — Telephone Encounter (Signed)
Pt needing to know if she's had the chicken pox. She wants blood work drawn to find out. She's looking at getting the shingrix shot.  Please advise.  Thanks, [employer]

## 2018-11-19 NOTE — Telephone Encounter (Signed)
She can have Shingrix regardless of whether she had chicken pox.  We do not need to do blood work as most people over the age of 11 were at least exposed as kids even if they did not get the rash.  I'd recommend she just go ahead with vaccination  Brita Romp, Dionne Bucy, MD, MPH [facility] 11/19/2018 8:40 AM

## 2018-11-19 NOTE — Telephone Encounter (Signed)
Patient advised.

## 2018-12-29 ENCOUNTER — Telehealth: Payer: Self-pay

## 2018-12-29 NOTE — Telephone Encounter (Signed)
Patient calling that she is having blurry vision that just started  Half an hour ago. Patient reports that she is at work and feels a little dizzy. Reports that her blood pressure stays at 140/90's always.  Denies SOB, headache, chest pain.  Per Dr. Jacinto Reap since is acute onset patient should go to the ED. Patient agreed.

## 2019-01-01 ENCOUNTER — Ambulatory Visit (INDEPENDENT_AMBULATORY_CARE_PROVIDER_SITE_OTHER): Payer: PPO | Admitting: Family Medicine

## 2019-01-01 ENCOUNTER — Encounter: Payer: Self-pay | Admitting: Family Medicine

## 2019-01-01 VITALS — BP 178/105 | HR 59 | Temp 98.1°F | Resp 16 | Wt 122.2 lb

## 2019-01-01 DIAGNOSIS — R51 Headache: Secondary | ICD-10-CM

## 2019-01-01 DIAGNOSIS — I10 Essential (primary) hypertension: Secondary | ICD-10-CM | POA: Diagnosis not present

## 2019-01-01 DIAGNOSIS — R519 Headache, unspecified: Secondary | ICD-10-CM | POA: Insufficient documentation

## 2019-01-01 DIAGNOSIS — G8929 Other chronic pain: Secondary | ICD-10-CM

## 2019-01-01 MED ORDER — HYDROCHLOROTHIAZIDE 12.5 MG PO TABS: 12.5000 mg | ORAL_TABLET | Freq: Every day | ORAL | 3 refills | Status: DC

## 2019-01-01 NOTE — Patient Instructions (Signed)

## 2019-01-01 NOTE — Progress Notes (Signed)
Patient: Alexis Lyons Female    DOB: 1941/09/03   78 y.o.   MRN: [id number] Visit Date: 01/02/2019  Today's Provider: Lavon Paganini, MD   Chief Complaint  Patient presents with  . Headache  . Hypertension   Subjective:     Headache   This is a chronic problem. The current episode started more than 1 month ago (occuring intermittently for 8 months in the past week increasingly getting  worse over the past week). The problem has been rapidly worsening. The pain is located in the frontal (forehead ) region. Radiates to: back of head on the right side. The pain quality is similar to prior headaches (patient reports this is similar to prior headches but has concerns because this was similar symptoms she experienced when she had her last syncope episode). The quality of the pain is described as sharp, squeezing and shooting. The pain is moderate. Associated symptoms include blurred vision, eye pain, scalp tenderness and a visual change. Pertinent negatives include no abdominal pain, abnormal behavior, anorexia, back pain, coughing, dizziness, drainage, ear pain, eye redness, eye watering, facial sweating, fever, hearing loss, insomnia, loss of balance, muscle aches, nausea, neck pain, numbness, phonophobia, photophobia, rhinorrhea, seizures, sinus pressure, sore throat, swollen glands, tingling, tinnitus, vomiting, weakness or weight loss. Nothing (patient states that headaches start usually at 4AM) aggravates the symptoms. Treatments tried: patient states that drinking caffeine relieves headaches. The treatment provided significant relief. Her past medical history is significant for hypertension.  Hypertension  This is a recurrent problem. The problem is uncontrolled. Associated symptoms include blurred vision and headaches. Pertinent negatives include no neck pain. There are no associated agents to hypertension. Treatments tried: Amlodipine. The current treatment provides no improvement.      She feels that no one has treated her HTN up until now. Reviewed visit from the last year when she declined anti-HTN and started amlodipine to self-discontinue in 2 weeks and not follow-up.  We discussed this intermittent vision loss that she has and will not go to the ER for when she has it.  MRI Brain showed chronic white matter ischemic changes.  She is currently asymptomatic other than her headaches.  Her headaches are unilateral and throbbing in nature.  They will wake her from sleep.  She states caffeine helps relieve these.  Allergies  Allergen Reactions  . Codeine      Current Outpatient Medications:  .  Ginseng 100 MG CAPS, every other day. , Disp: , Rfl:  .  sucralfate (CARAFATE) 1 g tablet, Take 1 tablet (1 g total) by mouth 4 (four) times daily -  with meals and at bedtime., Disp: 120 tablet, Rfl: 1 .  zolpidem (AMBIEN) 5 MG tablet, TAKE 1/2 TABLET BY MOUTH AT BEDTIME AS NEEDED SLEEP, Disp: 15 tablet, Rfl: 1 .  hydrochlorothiazide (HYDRODIURIL) 12.5 MG tablet, Take 1 tablet (12.5 mg total) by mouth daily., Disp: 90 tablet, Rfl: 3  Review of Systems  Constitutional: Negative.  Negative for fever and weight loss.  HENT: Negative for ear pain, hearing loss, rhinorrhea, sinus pressure, sore throat and tinnitus.   Eyes: Positive for blurred vision and pain. Negative for photophobia and redness.  Respiratory: Negative.  Negative for cough.   Cardiovascular: Negative.   Gastrointestinal: Negative for abdominal pain, anorexia, nausea and vomiting.  Musculoskeletal: Negative.  Negative for back pain and neck pain.  Neurological: Positive for headaches. Negative for dizziness, tingling, seizures, weakness, numbness and loss of balance.  Psychiatric/Behavioral:  The patient does not have insomnia.     Social History   Tobacco Use  . Smoking status: Former Smoker    Packs/day: 0.50    Years: 15.00    Pack years: 7.50    Types: Cigarettes    Last attempt to quit: 12/16/1985     Years since quitting: 33.0  . Smokeless tobacco: Never Used  Substance Use Topics  . Alcohol use: Yes    Alcohol/week: 7.0 standard drinks    Types: 7 Glasses of wine per week    Comment: glass of wine with dinner      Objective:   BP (!) 178/105   Pulse (!) 59   Temp 98.1 F (36.7 C) (Oral)   Resp 16   Wt 122 lb 3.2 oz (55.4 kg)   BMI 21.31 kg/m  Vitals:   01/01/19 0947  BP: (!) 178/105  Pulse: (!) 59  Resp: 16  Temp: 98.1 F (36.7 C)  TempSrc: Oral  Weight: 122 lb 3.2 oz (55.4 kg)     Physical Exam Vitals signs reviewed.  Constitutional:      General: She is not in acute distress.    Appearance: Normal appearance. She is well-developed. She is not diaphoretic.  HENT:     Head: Normocephalic and atraumatic.     Right Ear: External ear normal.     Left Ear: External ear normal.     Nose: Nose normal.     Mouth/Throat:     Mouth: Mucous membranes are moist.     Pharynx: Oropharynx is clear. No oropharyngeal exudate.  Eyes:     General: No scleral icterus.    Extraocular Movements: Extraocular movements intact.     Conjunctiva/sclera: Conjunctivae normal.     Pupils: Pupils are equal, round, and reactive to light.  Neck:     Musculoskeletal: Neck supple.     Thyroid: No thyromegaly.  Cardiovascular:     Rate and Rhythm: Normal rate and regular rhythm.     Heart sounds: Normal heart sounds. No murmur.  Pulmonary:     Effort: Pulmonary effort is normal. No respiratory distress.     Breath sounds: Normal breath sounds. No wheezing or rales.  Abdominal:     General: Bowel sounds are normal. There is no distension.     Palpations: Abdomen is soft.     Tenderness: There is no abdominal tenderness. There is no guarding or rebound.  Musculoskeletal:        General: No swelling, tenderness or deformity.     Right lower leg: No edema.     Left lower leg: No edema.  Lymphadenopathy:     Cervical: No cervical adenopathy.  Skin:    General: Skin is warm and dry.      Capillary Refill: Capillary refill takes less than 2 seconds.     Findings: No rash.  Neurological:     Mental Status: She is alert and oriented to person, place, and time.     Cranial Nerves: No cranial nerve deficit, dysarthria or facial asymmetry.     Sensory: No sensory deficit.     Motor: No weakness.     Gait: Gait normal.  Psychiatric:        Mood and Affect: Mood normal.        Behavior: Behavior normal.        Thought Content: Thought content normal.         Assessment & Plan   Problem List Items Addressed This  Visit      Cardiovascular and Mediastinum   Essential hypertension - Primary    Chronic Patient has had many visits with elevated BP She has previosuly been unwilling to take antihypertensives She does agree to the necessity currently As she states she had fatigue from amlodipine in the past, we will use HCTZ instead Check BMP At follow-up will consider dose titration      Relevant Medications   hydrochlorothiazide (HYDRODIURIL) 12.5 MG tablet   Other Relevant Orders   Basic Metabolic Panel (BMET) (Completed)     Other   Chronic headache    Chronic intermittent episodes of headaches They are somewhat migrainous in quality She also does seem to have some pressure that is likely related to her hypertension I wonder if her intermittent episodes of blurry vision/vision loss are part of a complex migraine picture versus a migraine aura She is neurologically intact currently, but we did discuss signs of stroke and need to go to emergency room if she develops any sudden neurologic deficit Reviewed recent MRI brain that shows chronic white matter changes and she was evaluated by ophthalmology who found no abnormalities We will work on blood pressure control and see if headaches are improving If not, we should consider migraine prophylaxis to see if this helps with both headaches and vision symptoms          Return in about 4 weeks (around 01/29/2019)  for BP f/u.   The entirety of the information documented in the History of Present Illness, Review of Systems and Physical Exam were personally obtained by me. Portions of this information were initially documented by Tiburcio Pea and Hebert Soho, CMA and reviewed by me for thoroughness and accuracy.    Virginia Crews, MD, MPH [facility] 01/02/2019 4:36 PM

## 2019-01-02 LAB — BASIC METABOLIC PANEL
BUN/Creatinine Ratio: 18 (ref 12–28)
BUN: 13 mg/dL (ref 8–27)
CO2: 23 mmol/L (ref 20–29)
CREATININE: 0.73 mg/dL (ref 0.57–1.00)
Calcium: 9.5 mg/dL (ref 8.7–10.3)
Chloride: 101 mmol/L (ref 96–106)
GFR, EST AFRICAN AMERICAN: 93 mL/min/{1.73_m2} (ref >59)
GFR, EST NON AFRICAN AMERICAN: 80 mL/min/{1.73_m2} (ref >59)
GLUCOSE: 93 mg/dL (ref 65–99)
Potassium: 4 mmol/L (ref 3.5–5.2)
SODIUM: 141 mmol/L (ref 134–144)

## 2019-01-02 NOTE — Assessment & Plan Note (Signed)
Chronic intermittent episodes of headaches They are somewhat migrainous in quality She also does seem to have some pressure that is likely related to her hypertension I wonder if her intermittent episodes of blurry vision/vision loss are part of a complex migraine picture versus a migraine aura She is neurologically intact currently, but we did discuss signs of stroke and need to go to emergency room if she develops any sudden neurologic deficit Reviewed recent MRI brain that shows chronic white matter changes and she was evaluated by ophthalmology who found no abnormalities We will work on blood pressure control and see if headaches are improving If not, we should consider migraine prophylaxis to see if this helps with both headaches and vision symptoms

## 2019-01-02 NOTE — Assessment & Plan Note (Signed)
Chronic Patient has had many visits with elevated BP She has previosuly been unwilling to take antihypertensives She does agree to the necessity currently As she states she had fatigue from amlodipine in the past, we will use HCTZ instead Check BMP At follow-up will consider dose titration

## 2019-01-16 ENCOUNTER — Encounter: Payer: Self-pay | Admitting: Physician Assistant

## 2019-01-16 ENCOUNTER — Ambulatory Visit (INDEPENDENT_AMBULATORY_CARE_PROVIDER_SITE_OTHER): Payer: PPO | Admitting: Physician Assistant

## 2019-01-16 ENCOUNTER — Telehealth: Payer: Self-pay | Admitting: Family Medicine

## 2019-01-16 VITALS — BP 139/81 | HR 69 | Temp 98.0°F | Resp 16 | Wt 121.0 lb

## 2019-01-16 DIAGNOSIS — S39012A Strain of muscle, fascia and tendon of lower back, initial encounter: Secondary | ICD-10-CM

## 2019-01-16 MED ORDER — BACLOFEN 10 MG PO TABS: 10.0000 mg | ORAL_TABLET | Freq: Three times a day (TID) | ORAL | 0 refills | Status: DC

## 2019-01-16 NOTE — Telephone Encounter (Signed)
Patient advised. Appointment scheduled with Tawanna Sat this afternoon.

## 2019-01-16 NOTE — Telephone Encounter (Signed)
Patient has been having back pain for 4 days, lower left back.   Has been taking Ibuprofen and Meloxicam without relief.   But she says she can take the pain any longer and she leaves Monday for vacation.   Do you need to see her or can you call something in for her.

## 2019-01-16 NOTE — Patient Instructions (Signed)

## 2019-01-16 NOTE — Telephone Encounter (Signed)
Do not take Ibuprofen and Meloxicam together.  Can take either with Tylenol.  She can be seen in Saturday clinic or this evening with Tawanna Sat

## 2019-01-16 NOTE — Progress Notes (Signed)
Patient: Alexis Lyons Female    DOB: 18-Aug-1941   78 y.o.   MRN: [id number] Visit Date: 01/16/2019  Today's Provider: Mar Daring, PA-C   No chief complaint on file.  Subjective:     Back Pain  This is a new problem. The current episode started in the past 7 days (4 days). The problem is unchanged. The pain is at a severity of 4/10. The pain is mild. The symptoms are aggravated by position and twisting. Pertinent negatives include no abdominal pain, bladder incontinence, bowel incontinence, chest pain, dysuria, fever, headaches, leg pain, numbness, paresis, paresthesias, pelvic pain, perianal numbness, tingling, weakness or weight loss. She has tried ice, heat and NSAIDs for the symptoms.    Patient states her back started hurting 4 days ago. Patient states she has been doing some home ab exercises lately in preparation for a trip to [place] to visit her sister. Patient states pain is in right side, lower back. Patient has been using ice, heat, ibuprofen, and meloxicam (not together) with no relief.    Allergies  Allergen Reactions  . Codeine      Current Outpatient Medications:  .  Ginseng 100 MG CAPS, every other day. , Disp: , Rfl:  .  hydrochlorothiazide (HYDRODIURIL) 12.5 MG tablet, Take 1 tablet (12.5 mg total) by mouth daily., Disp: 90 tablet, Rfl: 3 .  sucralfate (CARAFATE) 1 g tablet, Take 1 tablet (1 g total) by mouth 4 (four) times daily -  with meals and at bedtime., Disp: 120 tablet, Rfl: 1 .  zolpidem (AMBIEN) 5 MG tablet, TAKE 1/2 TABLET BY MOUTH AT BEDTIME AS NEEDED SLEEP, Disp: 15 tablet, Rfl: 1  Review of Systems  Constitutional: Negative for fever and weight loss.  Cardiovascular: Negative for chest pain.  Gastrointestinal: Negative for abdominal pain and bowel incontinence.  Genitourinary: Negative for bladder incontinence, dysuria and pelvic pain.  Musculoskeletal: Positive for back pain and myalgias. Negative for gait problem.    Neurological: Negative for tingling, weakness, numbness, headaches and paresthesias.    Social History   Tobacco Use  . Smoking status: Former Smoker    Packs/day: 0.50    Years: 15.00    Pack years: 7.50    Types: Cigarettes    Last attempt to quit: 12/16/1985    Years since quitting: 33.1  . Smokeless tobacco: Never Used  Substance Use Topics  . Alcohol use: Yes    Alcohol/week: 7.0 standard drinks    Types: 7 Glasses of wine per week    Comment: glass of wine with dinner      Objective:   BP 139/81 (BP Location: Left Arm, Patient Position: Sitting, Cuff Size: Normal)   Pulse 69   Temp 98 F (36.7 C) (Oral)   Resp 16   Wt 121 lb (54.9 kg)   SpO2 97%   BMI 21.10 kg/m  Vitals:   01/16/19 1828  BP: 139/81  Pulse: 69  Resp: 16  Temp: 98 F (36.7 C)  TempSrc: Oral  SpO2: 97%  Weight: 121 lb (54.9 kg)     Physical Exam Vitals signs reviewed.  Constitutional:      General: She is not in acute distress.    Appearance: She is well-developed. She is not diaphoretic.  Neck:     Musculoskeletal: Normal range of motion and neck supple.  Cardiovascular:     Rate and Rhythm: Normal rate and regular rhythm.     Heart sounds: Normal heart  sounds. No murmur. No friction rub. No gallop.   Pulmonary:     Effort: Pulmonary effort is normal. No respiratory distress.     Breath sounds: Normal breath sounds. No wheezing or rales.  Musculoskeletal:     Lumbar back: She exhibits decreased range of motion (pain elicited with forward flexion only) and tenderness. She exhibits no bony tenderness, no spasm and normal pulse.       Arms:        Assessment & Plan    1. Strain of lumbar region, initial encounter Suspect muscular strain from increased ab exercises. Discussed using a medrol dose pak, getting a methylprednisolone injection today in the office, getting a toradol injection in the office, but patient declined all treatment options. Discussed using baclofen and IBU  600mg  (patient had only been taking 200mg ) with tylenol at home, doing some light stretches and continuing heating pad. Patient agrees to this option. Call if worsening and will get imaging.  - baclofen (LIORESAL) 10 MG tablet; Take 1 tablet (10 mg total) by mouth 3 (three) times daily.  Dispense: 30 each; Refill: 0     Mar Daring, PA-C  [facility]

## 2019-02-05 ENCOUNTER — Ambulatory Visit: Payer: PPO | Admitting: Family Medicine

## 2019-02-05 ENCOUNTER — Ambulatory Visit (INDEPENDENT_AMBULATORY_CARE_PROVIDER_SITE_OTHER): Payer: PPO | Admitting: Family Medicine

## 2019-02-05 VITALS — BP 136/82 | HR 60 | Temp 97.7°F | Wt 122.6 lb

## 2019-02-05 DIAGNOSIS — I10 Essential (primary) hypertension: Secondary | ICD-10-CM

## 2019-02-05 NOTE — Progress Notes (Signed)
Patient: Alexis Lyons Female    DOB: 1941-04-22   78 y.o.   MRN: [id number] Visit Date: 02/05/2019  Today's Provider: Lavon Paganini, MD   Chief Complaint  Patient presents with  . Hypertension   Subjective:     HPI  Hypertension, follow-up:  BP Readings from Last 3 Encounters:  02/05/19 136/82  01/16/19 139/81  01/01/19 (!) 178/105    She was last seen for hypertension 1 months ago.  BP at that visit was 178/105. Management changes since that visit include hydrochlorothiazide (HYDRODIURIL) 12.5 MG tablet. She reports good compliance with treatment. She is not having side effects.  Outside blood pressures are around 130's/80's. She is experiencing none.  Patient denies chest pain, chest pressure/discomfort, fatigue, irregular heart beat, lower extremity edema, palpitations and tachypnea.    Weight trend: stable Wt Readings from Last 3 Encounters:  02/05/19 122 lb 9.6 oz (55.6 kg)  01/16/19 121 lb (54.9 kg)  01/01/19 122 lb 3.2 oz (55.4 kg)    Current diet: in general, a "healthy" diet    ------------------------------------------------------------------------  Allergies  Allergen Reactions  . Codeine      Current Outpatient Medications:  .  Ginseng 100 MG CAPS, every other day. , Disp: , Rfl:  .  hydrochlorothiazide (HYDRODIURIL) 12.5 MG tablet, Take 1 tablet (12.5 mg total) by mouth daily., Disp: 90 tablet, Rfl: 3 .  zolpidem (AMBIEN) 5 MG tablet, TAKE 1/2 TABLET BY MOUTH AT BEDTIME AS NEEDED SLEEP, Disp: 15 tablet, Rfl: 1 .  baclofen (LIORESAL) 10 MG tablet, Take 1 tablet (10 mg total) by mouth 3 (three) times daily. (Patient not taking: Reported on 02/05/2019), Disp: 30 each, Rfl: 0 .  sucralfate (CARAFATE) 1 g tablet, Take 1 tablet (1 g total) by mouth 4 (four) times daily -  with meals and at bedtime. (Patient not taking: Reported on 02/05/2019), Disp: 120 tablet, Rfl: 1  Review of Systems  HENT: Negative.   Respiratory: Negative.     Cardiovascular: Negative.   Genitourinary: Negative.   Neurological: Negative.     Social History   Tobacco Use  . Smoking status: Former Smoker    Packs/day: 0.50    Years: 15.00    Pack years: 7.50    Types: Cigarettes    Last attempt to quit: 12/16/1985    Years since quitting: 33.1  . Smokeless tobacco: Never Used  Substance Use Topics  . Alcohol use: Yes    Alcohol/week: 7.0 standard drinks    Types: 7 Glasses of wine per week    Comment: glass of wine with dinner      Objective:   BP 136/82 (BP Location: Left Arm, Patient Position: Sitting, Cuff Size: Normal)   Pulse 60   Temp 97.7 F (36.5 C) (Oral)   Wt 122 lb 9.6 oz (55.6 kg)   SpO2 98%   BMI 21.38 kg/m  Vitals:   02/05/19 1151  BP: 136/82  Pulse: 60  Temp: 97.7 F (36.5 C)  TempSrc: Oral  SpO2: 98%  Weight: 122 lb 9.6 oz (55.6 kg)     Physical Exam Vitals signs reviewed.  Constitutional:      General: She is not in acute distress.    Appearance: Normal appearance. She is not diaphoretic.  HENT:     Head: Normocephalic and atraumatic.     Right Ear: External ear normal.     Left Ear: External ear normal.     Mouth/Throat:     Pharynx: Oropharynx  is clear.  Eyes:     General: No scleral icterus.    Conjunctiva/sclera: Conjunctivae normal.  Neck:     Musculoskeletal: Neck supple.  Cardiovascular:     Rate and Rhythm: Normal rate and regular rhythm.     Pulses: Normal pulses.     Heart sounds: Normal heart sounds. No murmur.  Pulmonary:     Effort: Pulmonary effort is normal. No respiratory distress.     Breath sounds: Normal breath sounds. No wheezing or rhonchi.  Musculoskeletal:     Right lower leg: No edema.     Left lower leg: No edema.  Lymphadenopathy:     Cervical: No cervical adenopathy.  Skin:    General: Skin is warm and dry.     Capillary Refill: Capillary refill takes less than 2 seconds.     Findings: No rash.  Neurological:     Mental Status: She is alert and  oriented to person, place, and time. Mental status is at baseline.  Psychiatric:        Mood and Affect: Mood normal.        Behavior: Behavior normal.       Assessment & Plan   Problem List Items Addressed This Visit      Cardiovascular and Mediastinum   Essential hypertension - Primary    Well controlled Continue current medications Reviewed metabolic panel F/u in 6 months          Return in about 6 months (around 08/06/2019) for AWV/CPE.   The entirety of the information documented in the History of Present Illness, Review of Systems and Physical Exam were personally obtained by me. Portions of this information were initially documented by Tiburcio Pea, CMA and reviewed by me for thoroughness and accuracy.    Virginia Crews, MD, MPH [facility] 02/05/2019 12:54 PM

## 2019-02-05 NOTE — Progress Notes (Deleted)
° ° ° ° ° °  Patient: Alexis Lyons Female    DOB: 07/24/1941   78 y.o.   MRN: [id number] Visit Date: 02/05/2019  Today's Provider: Lavon Paganini, MD   No chief complaint on file.  Subjective:     Hypertension   Hypertension: Patient here for follow-up of elevated blood pressure. Patient was seen on 01/01/2019. BP was 178/105. Pt agreed to begin hydrochlorothiazide (HYDRODIURIL) 12.5 MG tablet.  She {is/is not:9024} exercising and {is/is not:9024} adherent to low salt diet.  Blood pressure {is/is not:9024} well controlled at home. Cardiac symptoms {Symptoms; cardiac:12860}. Patient denies {Symptoms; cardiac:12860}.  Cardiovascular risk factors: {cv risk factors:510}. Use of agents associated with hypertension: {bp agents assoc with hypertension:511::"none"}. History of target organ damage: {target organ:[id number]}.  Allergies  Allergen Reactions   Codeine      Current Outpatient Medications:    baclofen (LIORESAL) 10 MG tablet, Take 1 tablet (10 mg total) by mouth 3 (three) times daily., Disp: 30 each, Rfl: 0   Ginseng 100 MG CAPS, every other day. , Disp: , Rfl:    hydrochlorothiazide (HYDRODIURIL) 12.5 MG tablet, Take 1 tablet (12.5 mg total) by mouth daily., Disp: 90 tablet, Rfl: 3   sucralfate (CARAFATE) 1 g tablet, Take 1 tablet (1 g total) by mouth 4 (four) times daily -  with meals and at bedtime., Disp: 120 tablet, Rfl: 1   zolpidem (AMBIEN) 5 MG tablet, TAKE 1/2 TABLET BY MOUTH AT BEDTIME AS NEEDED SLEEP, Disp: 15 tablet, Rfl: 1  Review of Systems  Social History   Tobacco Use   Smoking status: Former Smoker    Packs/day: 0.50    Years: 15.00    Pack years: 7.50    Types: Cigarettes    Last attempt to quit: 12/16/1985    Years since quitting: 33.1   Smokeless tobacco: Never Used  Substance Use Topics   Alcohol use: Yes    Alcohol/week: 7.0 standard drinks    Types: 7 Glasses of wine per week    Comment: glass of wine with dinner      Objective:   There were no vitals taken for this visit. There were no vitals filed for this visit.   Physical Exam      Assessment & Plan        Lavon Paganini, MD  [facility]

## 2019-02-05 NOTE — Patient Instructions (Signed)
DASH Eating Plan  DASH stands for "Dietary Approaches to Stop Hypertension." The DASH eating plan is a healthy eating plan that has been shown to reduce high blood pressure (hypertension). It may also reduce your risk for type 2 diabetes, heart disease, and stroke. The DASH eating plan may also help with weight loss.  What are tips for following this plan?    General guidelines   Avoid eating more than 2,300 mg (milligrams) of salt (sodium) a day. If you have hypertension, you may need to reduce your sodium intake to 1,500 mg a day.   Limit alcohol intake to no more than 1 drink a day for nonpregnant women and 2 drinks a day for men. One drink equals 12 oz of beer, 5 oz of wine, or 1 oz of hard liquor.   Work with your health care provider to maintain a healthy body weight or to lose weight. Ask what an ideal weight is for you.   Get at least 30 minutes of exercise that causes your heart to beat faster (aerobic exercise) most days of the week. Activities may include walking, swimming, or biking.   Work with your health care provider or diet and nutrition specialist (dietitian) to adjust your eating plan to your individual calorie needs.  Reading food labels     Check food labels for the amount of sodium per serving. Choose foods with less than 5 percent of the Daily Value of sodium. Generally, foods with less than 300 mg of sodium per serving fit into this eating plan.   To find whole grains, look for the word "whole" as the first word in the ingredient list.  Shopping   Buy products labeled as "low-sodium" or "no salt added."   Buy fresh foods. Avoid canned foods and premade or frozen meals.  Cooking   Avoid adding salt when cooking. Use salt-free seasonings or herbs instead of table salt or sea salt. Check with your health care provider or pharmacist before using salt substitutes.   Do not fry foods. Cook foods using healthy methods such as baking, boiling, grilling, and broiling instead.   Cook with  heart-healthy oils, such as olive, canola, soybean, or sunflower oil.  Meal planning   Eat a balanced diet that includes:  ? 5 or more servings of fruits and vegetables each day. At each meal, try to fill half of your plate with fruits and vegetables.  ? Up to 6-8 servings of whole grains each day.  ? Less than 6 oz of lean meat, poultry, or fish each day. A 3-oz serving of meat is about the same size as a deck of cards. One egg equals 1 oz.  ? 2 servings of low-fat dairy each day.  ? A serving of nuts, seeds, or beans 5 times each week.  ? Heart-healthy fats. Healthy fats called Omega-3 fatty acids are found in foods such as flaxseeds and coldwater fish, like sardines, salmon, and mackerel.   Limit how much you eat of the following:  ? Canned or prepackaged foods.  ? Food that is high in trans fat, such as fried foods.  ? Food that is high in saturated fat, such as fatty meat.  ? Sweets, desserts, sugary drinks, and other foods with added sugar.  ? Full-fat dairy products.   Do not salt foods before eating.   Try to eat at least 2 vegetarian meals each week.   Eat more home-cooked food and less restaurant, buffet, and fast food.     When eating at a restaurant, ask that your food be prepared with less salt or no salt, if possible.  What foods are recommended?  The items listed may not be a complete list. Talk with your dietitian about what dietary choices are best for you.  Grains  Whole-grain or whole-wheat bread. Whole-grain or whole-wheat pasta. Brown rice. Oatmeal. Quinoa. Bulgur. Whole-grain and low-sodium cereals. Pita bread. Low-fat, low-sodium crackers. Whole-wheat flour tortillas.  Vegetables  Fresh or frozen vegetables (raw, steamed, roasted, or grilled). Low-sodium or reduced-sodium tomato and vegetable juice. Low-sodium or reduced-sodium tomato sauce and tomato paste. Low-sodium or reduced-sodium canned vegetables.  Fruits  All fresh, dried, or frozen fruit. Canned fruit in natural juice (without  added sugar).  Meat and other protein foods  Skinless chicken or turkey. Ground chicken or turkey. Pork with fat trimmed off. Fish and seafood. Egg whites. Dried beans, peas, or lentils. Unsalted nuts, nut butters, and seeds. Unsalted canned beans. Lean cuts of beef with fat trimmed off. Low-sodium, lean deli meat.  Dairy  Low-fat (1%) or fat-free (skim) milk. Fat-free, low-fat, or reduced-fat cheeses. Nonfat, low-sodium ricotta or cottage cheese. Low-fat or nonfat yogurt. Low-fat, low-sodium cheese.  Fats and oils  Soft margarine without trans fats. Vegetable oil. Low-fat, reduced-fat, or light mayonnaise and salad dressings (reduced-sodium). Canola, safflower, olive, soybean, and sunflower oils. Avocado.  Seasoning and other foods  Herbs. Spices. Seasoning mixes without salt. Unsalted popcorn and pretzels. Fat-free sweets.  What foods are not recommended?  The items listed may not be a complete list. Talk with your dietitian about what dietary choices are best for you.  Grains  Baked goods made with fat, such as croissants, muffins, or some breads. Dry pasta or rice meal packs.  Vegetables  Creamed or fried vegetables. Vegetables in a cheese sauce. Regular canned vegetables (not low-sodium or reduced-sodium). Regular canned tomato sauce and paste (not low-sodium or reduced-sodium). Regular tomato and vegetable juice (not low-sodium or reduced-sodium). Pickles. Olives.  Fruits  Canned fruit in a light or heavy syrup. Fried fruit. Fruit in cream or butter sauce.  Meat and other protein foods  Fatty cuts of meat. Ribs. Fried meat. Bacon. Sausage. Bologna and other processed lunch meats. Salami. Fatback. Hotdogs. Bratwurst. Salted nuts and seeds. Canned beans with added salt. Canned or smoked fish. Whole eggs or egg yolks. Chicken or turkey with skin.  Dairy  Whole or 2% milk, cream, and half-and-half. Whole or full-fat cream cheese. Whole-fat or sweetened yogurt. Full-fat cheese. Nondairy creamers. Whipped toppings.  Processed cheese and cheese spreads.  Fats and oils  Butter. Stick margarine. Lard. Shortening. Ghee. Bacon fat. Tropical oils, such as coconut, palm kernel, or palm oil.  Seasoning and other foods  Salted popcorn and pretzels. Onion salt, garlic salt, seasoned salt, table salt, and sea salt. Worcestershire sauce. Tartar sauce. Barbecue sauce. Teriyaki sauce. Soy sauce, including reduced-sodium. Steak sauce. Canned and packaged gravies. Fish sauce. Oyster sauce. Cocktail sauce. Horseradish that you find on the shelf. Ketchup. Mustard. Meat flavorings and tenderizers. Bouillon cubes. Hot sauce and Tabasco sauce. Premade or packaged marinades. Premade or packaged taco seasonings. Relishes. Regular salad dressings.  Where to find more information:   National Heart, Lung, and Blood Institute: www.nhlbi.nih.gov   American Heart Association: www.heart.org  Summary   The DASH eating plan is a healthy eating plan that has been shown to reduce high blood pressure (hypertension). It may also reduce your risk for type 2 diabetes, heart disease, and stroke.   With the   DASH eating plan, you should limit salt (sodium) intake to 2,300 mg a day. If you have hypertension, you may need to reduce your sodium intake to 1,500 mg a day.   When on the DASH eating plan, aim to eat more fresh fruits and vegetables, whole grains, lean proteins, low-fat dairy, and heart-healthy fats.   Work with your health care provider or diet and nutrition specialist (dietitian) to adjust your eating plan to your individual calorie needs.  This information is not intended to replace advice given to you by your health care provider. Make sure you discuss any questions you have with your health care provider.  Document Released: 11/22/2011 Document Revised: 11/26/2016 Document Reviewed: 11/26/2016  Elsevier Interactive Patient Education  2019 [employer]vier Inc[employer]

## 2019-02-05 NOTE — Assessment & Plan Note (Signed)
Well controlled Continue current medications Reviewed metabolic panel F/u in 6 months  

## 2019-04-22 ENCOUNTER — Telehealth: Payer: Self-pay

## 2019-04-22 NOTE — Telephone Encounter (Signed)
LMTCB about PHQ screening

## 2019-05-12 ENCOUNTER — Other Ambulatory Visit: Payer: Self-pay | Admitting: Family Medicine

## 2019-05-12 DIAGNOSIS — F5102 Adjustment insomnia: Secondary | ICD-10-CM

## 2019-05-13 NOTE — Telephone Encounter (Signed)
L.O.V. 02/05/2019 please advise.

## 2019-05-18 ENCOUNTER — Ambulatory Visit (INDEPENDENT_AMBULATORY_CARE_PROVIDER_SITE_OTHER): Payer: PPO | Admitting: Family Medicine

## 2019-05-18 ENCOUNTER — Encounter: Payer: Self-pay | Admitting: Family Medicine

## 2019-05-18 DIAGNOSIS — R197 Diarrhea, unspecified: Secondary | ICD-10-CM | POA: Diagnosis not present

## 2019-05-18 NOTE — Patient Instructions (Signed)
Food Choices to Help Relieve Diarrhea, Adult  When you have diarrhea, the foods you eat and your eating habits are very important. Choosing the right foods and drinks can help:   Relieve diarrhea.   Replace lost fluids and nutrients.   Prevent dehydration.  What general guidelines should I follow?    Relieving diarrhea   Choose foods with less than 2 g or .07 oz. of fiber per serving.   Limit fats to less than 8 tsp (38 g or 1.34 oz.) a day.   Avoid the following:  ? Foods and beverages sweetened with high-fructose corn syrup, honey, or sugar alcohols such as xylitol, sorbitol, and mannitol.  ? Foods that contain a lot of fat or sugar.  ? Fried, greasy, or spicy foods.  ? High-fiber grains, breads, and cereals.  ? Raw fruits and vegetables.   Eat foods that are rich in probiotics. These foods include dairy products such as yogurt and fermented milk products. They help increase healthy bacteria in the stomach and intestines (gastrointestinal tract, or GI tract).   If you have lactose intolerance, avoid dairy products. These may make your diarrhea worse.   Take medicine to help stop diarrhea (antidiarrheal medicine) only as told by your health care provider.  Replacing nutrients   Eat small meals or snacks every 3-4 hours.   Eat bland foods, such as white rice, toast, or baked potato, until your diarrhea starts to get better. Gradually reintroduce nutrient-rich foods as tolerated or as told by your health care provider. This includes:  ? Well-cooked protein foods.  ? Peeled, seeded, and soft-cooked fruits and vegetables.  ? Low-fat dairy products.   Take vitamin and mineral supplements as told by your health care provider.  Preventing dehydration   Start by sipping water or a special solution to prevent dehydration (oral rehydration solution, ORS). Urine that is clear or pale yellow means that you are getting enough fluid.   Try to drink at least 8-10 cups of fluid each day to help replace lost  fluids.   You may add other liquids in addition to water, such as clear juice or decaffeinated sports drinks, as tolerated or as told by your health care provider.   Avoid drinks with caffeine, such as coffee, tea, or soft drinks.   Avoid alcohol.  What foods are recommended?         The items listed may not be a complete list. Talk with your health care provider about what dietary choices are best for you.  Grains  White rice. White, French, or pita breads (fresh or toasted), including plain rolls, buns, or bagels. White pasta. Saltine, soda, or graham crackers. Pretzels. Low-fiber cereal. Cooked cereals made with water (such as cornmeal, farina, or cream cereals). Plain muffins. Matzo. Melba toast. Zwieback.  Vegetables  Potatoes (without the skin). Most well-cooked and canned vegetables without skins or seeds. Tender lettuce.  Fruits  Apple sauce. Fruits canned in juice. Cooked apricots, cherries, grapefruit, peaches, pears, or plums. Fresh bananas and cantaloupe.  Meats and other protein foods  Baked or boiled chicken. Eggs. Tofu. Fish. Seafood. Smooth nut butters. Ground or well-cooked tender beef, ham, veal, lamb, pork, or poultry.  Dairy  Plain yogurt, kefir, and unsweetened liquid yogurt. Lactose-free milk, buttermilk, skim milk, or soy milk. Low-fat or nonfat hard cheese.  Beverages  Water. Low-calorie sports drinks. Fruit juices without pulp. Strained tomato and vegetable juices. Decaffeinated teas. Sugar-free beverages not sweetened with sugar alcohols. Oral rehydration solutions, if   approved by your health care provider.  Seasoning and other foods  Bouillon, broth, or soups made from recommended foods.  What foods are not recommended?  The items listed may not be a complete list. Talk with your health care provider about what dietary choices are best for you.  Grains  Whole grain, whole wheat, bran, or rye breads, rolls, pastas, and crackers. Wild or brown rice. Whole grain or bran cereals. Barley.  Oats and oatmeal. Corn tortillas or taco shells. Granola. Popcorn.  Vegetables  Raw vegetables. Fried vegetables. Cabbage, broccoli, Brussels sprouts, artichokes, baked beans, beet greens, corn, kale, legumes, peas, sweet potatoes, and yams. Potato skins. Cooked spinach and cabbage.  Fruits  Dried fruit, including raisins and dates. Raw fruits. Stewed or dried prunes. Canned fruits with syrup.  Meat and other protein foods  Fried or fatty meats. Deli meats. Chunky nut butters. Nuts and seeds. Beans and lentils. Bacon. Hot dogs. Sausage.  Dairy  High-fat cheeses. Whole milk, chocolate milk, and beverages made with milk, such as milk shakes. Half-and-half. Cream. sour cream. Ice cream.  Beverages  Caffeinated beverages (such as coffee, tea, soda, or energy drinks). Alcoholic beverages. Fruit juices with pulp. Prune juice. Soft drinks sweetened with high-fructose corn syrup or sugar alcohols. High-calorie sports drinks.  Fats and oils  Butter. Cream sauces. Margarine. Salad oils. Plain salad dressings. Olives. Avocados. Mayonnaise.  Sweets and desserts  Sweet rolls, doughnuts, and sweet breads. Sugar-free desserts sweetened with sugar alcohols such as xylitol and sorbitol.  Seasoning and other foods  Honey. Hot sauce. Chili powder. Gravy. Cream-based or milk-based soups. Pancakes and waffles.  Summary   When you have diarrhea, the foods you eat and your eating habits are very important.   Make sure you get at least 8-10 cups of fluid each day, or enough to keep your urine clear or pale yellow.   Eat bland foods and gradually reintroduce healthy, nutrient-rich foods as tolerated, or as told by your health care provider.   Avoid high-fiber, fried, greasy, or spicy foods.  This information is not intended to replace advice given to you by your health care provider. Make sure you discuss any questions you have with your health care provider.  Document Released: 02/23/2004 Document Revised: 11/30/2016 Document Reviewed:  11/30/2016  Elsevier Interactive Patient Education  2019 Elsevier Inc.

## 2019-05-18 NOTE — Progress Notes (Signed)
Patient: Alexis Lyons Female    DOB: Oct 29, 1941   78 y.o.   MRN: [id number] Visit Date: 05/18/2019  Today's Provider: Lavon Paganini, MD   Chief Complaint  Patient presents with  . Diarrhea   Subjective:    Virtual Visit via Video Note  I connected with Tiana Loft on 05/18/19 at  1:40 PM EDT by a video enabled telemedicine application and verified that I am speaking with the correct person using two identifiers.  Patient location: home Provider location: [place] involved in the visit: patient, provider    I discussed the limitations of evaluation and management by telemedicine and the availability of in person appointments. The patient expressed understanding and agreed to proceed.   Diarrhea   This is a new problem. Episode onset: 4 days ago. The problem occurs 5 to 10 times per day. The problem has been unchanged. The stool consistency is described as mucous and watery. The patient states that diarrhea awakens her from sleep. Nothing aggravates the symptoms. Risk factors: Patient states she ate chicken 3-4 days in a row. Treatments tried: Immodium  The treatment provided no relief.   Imodium helped for a little while No fever, nausea, vomiting, abd pain Does have intermittent  She states that she made chicken cutlets for 3-4 meals right before diarrhea started.  She thought it was fresh and good chicken.  She has no sick contacts. 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 Codeine      Current Outpatient Medications:  .  Ginseng 100 MG CAPS, every other day. , Disp: , Rfl:  .  hydrochlorothiazide (HYDRODIURIL) 12.5 MG tablet, Take 1 tablet (12.5 mg total) by mouth daily., Disp: 90 tablet, Rfl: 3 .  zolpidem (AMBIEN) 5 MG tablet, TAKE 1/2 TABLET BY MOUTH AT BEDTIME AS NEEDED SLEEP, Disp: 15 tablet, Rfl: 0  Review of Systems  Constitutional: Negative.   Respiratory: Negative.    Cardiovascular: Negative.   Gastrointestinal: Positive for diarrhea.  Musculoskeletal: Negative.     Social History   Tobacco Use  . Smoking status: Former Smoker    Packs/day: 0.50    Years: 15.00    Pack years: 7.50    Types: Cigarettes    Last attempt to quit: 12/16/1985    Years since quitting: 33.4  . Smokeless tobacco: Never Used  Substance Use Topics  . Alcohol use: Yes    Alcohol/week: 7.0 standard drinks    Types: 7 Glasses of wine per week    Comment: glass of wine with dinner      Objective:   There were no vitals taken for this visit. There were no vitals filed for this visit.   Physical Exam Constitutional:      General: She is not in acute distress.    Appearance: Normal appearance. She is not toxic-appearing.  Pulmonary:     Effort: Pulmonary effort is normal. No respiratory distress.  Neurological:     Mental Status: She is alert and oriented to person, place, and time. Mental status is at baseline.  Psychiatric:        Mood and Affect: Mood normal.        Behavior: Behavior normal.         Assessment & Plan    I discussed the assessment and treatment plan with the patient. The patient was provided an opportunity to ask questions and  all were answered. The patient agreed with the plan and demonstrated an understanding of the instructions.   The patient was advised to call back or seek an in-person evaluation if the symptoms worsen or if the condition fails to improve as anticipated.  1. Diarrhea of presumed infectious origin -New problem - Nonbloody, no abdominal pain, no fevers or other concerning symptoms - Suspect possible bacterial diarrheal illness given how many times per day she is having episodes of loose stools and that this is been going on for 4 days now - We will test for GI pathogen panel and C. difficile - Discussed possibility of empiric antibiotics while waiting for test results, but patient declines at this time - Discussed  importance of symptomatic management and hydration -Discussed brat diet -Discussed strict return precautions -Will treat as indicated by test results - GI Profile, Stool, PCR - Clostridium Difficile by PCR   Return if symptoms worsen or fail to improve.   The entirety of the information documented in the History of Present Illness, Review of Systems and Physical Exam were personally obtained by me. Portions of this information were initially documented by Tiburcio Pea, CMA and reviewed by me for thoroughness and accuracy.    Bacigalupo, Dionne Bucy, MD MPH [facility]

## 2019-05-19 DIAGNOSIS — R197 Diarrhea, unspecified: Secondary | ICD-10-CM | POA: Diagnosis not present

## 2019-05-21 LAB — CLOSTRIDIUM DIFFICILE BY PCR: Toxigenic C. Difficile by PCR: NEGATIVE

## 2019-05-24 LAB — GI PROFILE, STOOL, PCR

## 2019-05-25 ENCOUNTER — Telehealth: Payer: Self-pay

## 2019-05-25 NOTE — Telephone Encounter (Signed)
Patient was advised.Patietnt states that she been eating banana and it has really helped out. FYI

## 2019-05-25 NOTE — Telephone Encounter (Signed)
-----   Message from Virginia Crews, MD sent at 05/25/2019  8:15 AM EDT ----- Negative stool studies.  How are symptoms?

## 2019-05-28 ENCOUNTER — Telehealth: Payer: Self-pay

## 2019-05-28 DIAGNOSIS — R109 Unspecified abdominal pain: Secondary | ICD-10-CM

## 2019-05-28 NOTE — Telephone Encounter (Signed)
Let's refer to GI. I usually use [facility], unless she has a different preference

## 2019-05-28 NOTE — Telephone Encounter (Signed)
Patient called to let Dr. B know that her diarrhea, abdominal pain and bloating is not any better after 3 weeks. She has tried eating bananas and taking Imodium. That helped at first, but is now no longer working. Patient wants to know what she needs to do.

## 2019-05-28 NOTE — Telephone Encounter (Signed)
GI referral placed

## 2019-07-07 DIAGNOSIS — R140 Abdominal distension (gaseous): Secondary | ICD-10-CM | POA: Diagnosis not present

## 2019-07-07 DIAGNOSIS — R5383 Other fatigue: Secondary | ICD-10-CM | POA: Diagnosis not present

## 2019-07-07 DIAGNOSIS — R1084 Generalized abdominal pain: Secondary | ICD-10-CM | POA: Diagnosis not present

## 2019-07-07 DIAGNOSIS — K5900 Constipation, unspecified: Secondary | ICD-10-CM | POA: Diagnosis not present

## 2019-07-07 DIAGNOSIS — E538 Deficiency of other specified B group vitamins: Secondary | ICD-10-CM | POA: Diagnosis not present

## 2019-07-07 DIAGNOSIS — Z8719 Personal history of other diseases of the digestive system: Secondary | ICD-10-CM | POA: Diagnosis not present

## 2019-07-07 LAB — BASIC METABOLIC PANEL
BUN: 15 (ref 4–21)
Creatinine: 0.9 (ref <=1.1)
Glucose: 99
Potassium: 3.6 (ref 3.4–5.3)
Sodium: 141 (ref 137–147)

## 2019-07-07 LAB — IRON,TIBC AND FERRITIN PANEL
Ferritin: 106
Iron: 107
TIBC: 373.4

## 2019-07-07 LAB — HEPATIC FUNCTION PANEL
ALT: 16 (ref 7–35)
AST: 19 (ref 13–35)
Alkaline Phosphatase: 58 (ref 25–125)
Bilirubin, Total: 0.8

## 2019-07-07 LAB — CBC AND DIFFERENTIAL
HCT: 43 (ref 36–46)
Hemoglobin: 14.2 (ref 12.0–16.0)
Platelets: 229 (ref 150–399)
WBC: 6.1

## 2019-07-07 LAB — VITAMIN B12: Vitamin B-12: 143

## 2019-07-10 ENCOUNTER — Telehealth: Payer: Self-pay | Admitting: Family Medicine

## 2019-07-10 ENCOUNTER — Other Ambulatory Visit: Payer: Self-pay | Admitting: Student

## 2019-07-10 DIAGNOSIS — R140 Abdominal distension (gaseous): Secondary | ICD-10-CM

## 2019-07-10 DIAGNOSIS — R1084 Generalized abdominal pain: Secondary | ICD-10-CM

## 2019-07-10 NOTE — Telephone Encounter (Signed)
Pt called stating that Dr. Sharyn Lull Johnson's office called to advise that her B 12 was very low and suggested pt see what Dr. B suggest pt do about her B 12. Pt stated that Dr. Durenda Age office is sending her test results to our office. Please advise. Thanks TNP

## 2019-07-10 NOTE — Telephone Encounter (Signed)
Have not seen the lab, will have Nichole watch for it

## 2019-07-13 NOTE — Telephone Encounter (Signed)
Please abstract labs and I will review them.

## 2019-07-13 NOTE — Telephone Encounter (Signed)
We have received labs from [facility] and will place in Dr. Sharmaine Base box. Please advise. Thanks TNP

## 2019-07-14 NOTE — Telephone Encounter (Signed)
Patient was advised that labs has been received and they will be enter into the computer for Dr. B to review.

## 2019-07-14 NOTE — Telephone Encounter (Signed)
Pt calling back 2nd time regarding her B12 deficiently.   Pt is experiencing numbness in right foot.  Pt not sure what Dr. B wants her to do.  Please advise.  Thanks,  [employer]

## 2019-07-14 NOTE — Telephone Encounter (Signed)
Labs abstracted into the system.

## 2019-07-15 NOTE — Telephone Encounter (Signed)
Labs are normal, except B12 level is low. Recommend starting OTC B12 supplement once daily.  Can recheck in 3 motnhs.

## 2019-07-15 NOTE — Telephone Encounter (Signed)
Patient was advised and wants to know which dose of the B12 should she get? Please advise.

## 2019-07-15 NOTE — Telephone Encounter (Signed)
Patient advised.

## 2019-07-15 NOTE — Telephone Encounter (Signed)
1000 mcg daily

## 2019-07-17 ENCOUNTER — Other Ambulatory Visit: Payer: Self-pay

## 2019-07-17 ENCOUNTER — Ambulatory Visit
Admission: RE | Admit: 2019-07-17 | Discharge: 2019-07-17 | Disposition: A | Discharge status: Home / Self Care | Payer: PPO | Source: Ambulatory Visit | Attending: Student | Admitting: Student

## 2019-07-17 DIAGNOSIS — R140 Abdominal distension (gaseous): Secondary | ICD-10-CM | POA: Insufficient documentation

## 2019-07-17 DIAGNOSIS — R1084 Generalized abdominal pain: Secondary | ICD-10-CM | POA: Diagnosis not present

## 2019-07-17 DIAGNOSIS — N281 Cyst of kidney, acquired: Secondary | ICD-10-CM | POA: Diagnosis not present

## 2019-07-29 ENCOUNTER — Encounter: Payer: Self-pay | Admitting: Family Medicine

## 2019-07-29 ENCOUNTER — Other Ambulatory Visit: Payer: Self-pay

## 2019-07-29 ENCOUNTER — Ambulatory Visit (INDEPENDENT_AMBULATORY_CARE_PROVIDER_SITE_OTHER): Payer: PPO | Admitting: Family Medicine

## 2019-07-29 VITALS — BP 130/75 | HR 60 | Temp 97.3°F | Ht 63.5 in | Wt 118.4 lb

## 2019-07-29 DIAGNOSIS — Z1239 Encounter for other screening for malignant neoplasm of breast: Secondary | ICD-10-CM | POA: Diagnosis not present

## 2019-07-29 DIAGNOSIS — Z0000 Encounter for general adult medical examination without abnormal findings: Secondary | ICD-10-CM

## 2019-07-29 DIAGNOSIS — I10 Essential (primary) hypertension: Secondary | ICD-10-CM

## 2019-07-29 DIAGNOSIS — F5102 Adjustment insomnia: Secondary | ICD-10-CM

## 2019-07-29 DIAGNOSIS — Z23 Encounter for immunization: Secondary | ICD-10-CM

## 2019-07-29 NOTE — Assessment & Plan Note (Signed)
Well controlled Continue current medications Recheck metabolic panel F/u in 6 months  

## 2019-07-29 NOTE — Progress Notes (Signed)
Patient: Alexis Lyons, Female    DOB: 1941/02/10, 78 y.o.   MRN: [id number] Visit Date: 07/29/2019  Today's Provider: Lavon Paganini, MD   Chief Complaint  Patient presents with  . Medicare Wellness   Subjective:    Annual wellness visit Alexis Lyons is a 78 y.o. female who presents today for her Subsequent Annual Wellness Visit. She feels well. She reports exercising 3-4 days per week. She reports she is sleeping well.  ----------------------------------------------------------- HTN: - Medications: HCTZ 12.5mg  daily - Compliance: good - Checking BP at home: yes, running in 130s/80s - Denies any SOB, CP, vision changes, LE edema, medication SEs, or symptoms of hypotension - Diet: low sodium   Review of Systems  Constitutional: Negative.   HENT: Negative.   Eyes: Negative.   Respiratory: Negative.   Cardiovascular: Negative.   Gastrointestinal: Positive for abdominal distention and constipation.  Endocrine: Negative.   Genitourinary: Negative.   Musculoskeletal: Negative.   Skin: Negative.   Allergic/Immunologic: Negative.   Neurological: Negative.   Hematological: Negative.   Psychiatric/Behavioral: Negative.     Social History   Socioeconomic History  . Marital status: Divorced    Spouse name: Not on file  . Number of children: 2  . Years of education: 24  . Highest education level: Not on file  Occupational History  . Occupation: [employer] in [place]  . Financial resource strain: Not on file  . Food insecurity    Worry: Not on file    Inability: Not on file  . Transportation needs    Medical: Not on file    Non-medical: Not on file  Tobacco Use  . Smoking status: Former Smoker    Packs/day: 0.50    Years: 15.00    Pack years: 7.50    Types: Cigarettes    Quit date: 12/16/1985    Years since quitting: 33.6  . Smokeless tobacco: Never Used  Substance and Sexual Activity  . Alcohol use: Yes    Alcohol/week: 7.0 standard drinks     Types: 7 Glasses of wine per week    Comment: glass of wine with dinner  . Drug use: No  . Sexual activity: Not Currently  Lifestyle  . Physical activity    Days per week: Not on file    Minutes per session: Not on file  . Stress: Not on file  Relationships  . Social Herbalist on phone: Not on file    Gets together: Not on file    Attends religious service: Not on file    Active member of club or organization: Not on file    Attends meetings of clubs or organizations: Not on file    Relationship status: Not on file  . Intimate partner violence    Fear of current or ex partner: Not on file    Emotionally abused: Not on file    Physically abused: Not on file    Forced sexual activity: Not on file  Other Topics Concern  . Not on file  Social History Narrative  . Not on file    Patient Active Problem List   Diagnosis Date Noted  . Chronic headache 01/01/2019  . Essential hypertension 07/09/2018  . Osteopenia 11/06/2017  . Seborrheic dermatitis 11/05/2017  . Aphthae 05/28/2015  . DD (diverticular disease) 05/28/2015  . Fatigue 05/28/2015  . Hernia, inguinal, right 05/28/2015  . Amnesia 05/28/2015  . 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 Atrophy of vagina 05/28/2015  .  Vaginal lesion 09/21/2008  . Acid reflux 06/15/2008  . Insomnia 12/17/2004    Past Surgical History:  Procedure Laterality Date  . CHOLECYSTECTOMY    . colonoscopy with polypectomy     8/11 4 mm polyp, 12 mm polyp  . COLONOSCOPY WITH PROPOFOL N/A 02/24/2018   Procedure: COLONOSCOPY WITH PROPOFOL;  Surgeon: Manya Silvas, MD;  Location: [facility]COPY;  Service: Endoscopy;  Laterality: N/A;  . HERNIA REPAIR Right 2004   inguinal   . HERNIA REPAIR     x's 3    Her family history includes Alcohol abuse in her father; Alzheimer's disease in her mother; Breast cancer in her sister; Cancer in her father and sister.     Previous Medications   GINSENG 100 MG CAPS    every other day.     HYDROCHLOROTHIAZIDE (HYDRODIURIL) 12.5 MG TABLET    Take 1 tablet (12.5 mg total) by mouth daily.   ZOLPIDEM (AMBIEN) 5 MG TABLET    TAKE 1/2 TABLET BY MOUTH AT BEDTIME AS NEEDED SLEEP    Patient Care Team: Virginia Crews, MD as PCP - General (Family Medicine)      Objective:   Vitals: BP 130/75   Pulse 60   Temp (!) 97.3 F (36.3 C) (Oral)   Ht 5' 3.5" (1.613 m)   Wt 118 lb 6.4 oz (53.7 kg)   SpO2 97%   BMI 20.64 kg/m   Physical Exam Vitals signs reviewed.  Constitutional:      General: She is not in acute distress.    Appearance: Normal appearance. She is well-developed. She is not diaphoretic.  HENT:     Head: Normocephalic and atraumatic.     Right Ear: Tympanic membrane, ear canal and external ear normal.     Left Ear: Tympanic membrane, ear canal and external ear normal.  Eyes:     General: No scleral icterus.    Conjunctiva/sclera: Conjunctivae normal.     Pupils: Pupils are equal, round, and reactive to light.  Neck:     Musculoskeletal: Neck supple.     Thyroid: No thyromegaly.  Cardiovascular:     Rate and Rhythm: Normal rate and regular rhythm.     Pulses: Normal pulses.     Heart sounds: Normal heart sounds. No murmur.  Pulmonary:     Effort: Pulmonary effort is normal. No respiratory distress.     Breath sounds: Normal breath sounds. No wheezing or rales.  Abdominal:     General: There is no distension.     Palpations: Abdomen is soft.     Tenderness: There is no abdominal tenderness. There is no guarding or rebound.  Musculoskeletal:        General: No deformity.     Right lower leg: No edema.     Left lower leg: No edema.  Lymphadenopathy:     Cervical: No cervical adenopathy.  Skin:    General: Skin is warm and dry.     Capillary Refill: Capillary refill takes less than 2 seconds.     Findings: No rash.  Neurological:     Mental Status: She is alert and oriented to person, place, and time. Mental status is at baseline.  Psychiatric:         Mood and Affect: Mood normal.        Behavior: Behavior normal.        Thought Content: Thought content normal.     Activities of Daily Living In your present state of health,  do you have any difficulty performing the following activities: 07/29/2019  Hearing? N  Vision? N  Difficulty concentrating or making decisions? N  Walking or climbing stairs? N  Dressing or bathing? N  Doing errands, shopping? N  Some recent data might be hidden    Fall Risk Assessment Fall Risk  07/29/2019 11/05/2017 11/24/2015  Falls in the past year? 0 No No     Depression Screen PHQ 2/9 Scores 07/29/2019 11/05/2017 11/24/2015  PHQ - 2 Score 0 0 0  PHQ- 9 Score 0 - -    Cognitive Testing - 6-CIT  Correct? Score   What year is it? yes 0 0 or 4  What month is it? yes 0 0 or 3  Memorize:    [name],  43,  [address]  [place],      What time is it? (within 1 hour) yes 0 0 or 3  Count backwards from 20 yes 0 0, 2, or 4  Name the months of the year yes 0 0, 2, or 4  Repeat name & address above yes 2 0, 2, 4, 6, 8, or 10       TOTAL SCORE  2/28   Interpretation:  Normal  Normal (0-7) Abnormal (8-28)       Assessment & Plan:     Annual Wellness Visit  Reviewed patient's Family Medical History Reviewed and updated list of patient's medical providers Assessment of cognitive impairment was done Assessed patient's functional ability Established a written schedule for health screening [place] Completed and Reviewed  Exercise Activities and Dietary recommendations Goals   None     Immunization History  Administered Date(s) Administered  . Hepatitis A 07/26/2015  . Pneumococcal Polysaccharide-23 01/01/2011  . Td 06/23/2008  . Typhoid Inactivated 07/26/2015  . Zoster Recombinat (Shingrix) 06/25/2019    Health Maintenance  Topic Date Due  . PNA vac Low Risk Adult (2 of 2 - PCV13) 01/02/2012  . TETANUS/TDAP  06/23/2018  . INFLUENZA VACCINE  07/18/2019   . DEXA SCAN  Completed     Discussed health benefits of physical activity, and encouraged her to engage in regular exercise appropriate for her age and condition.    ------------------------------------------------------------------------------------------------------------  Problem List Items Addressed This Visit      Cardiovascular and Mediastinum   Essential hypertension    Well controlled Continue current medications Recheck metabolic panel F/u in 6 months       Relevant Orders   Lipid panel     Other   Insomnia    Doing well on Ambien 2.5mg  qhs prn when traveling or changing time zones Takes sparingly Discussed risks of hypersomnolence, falls, confusion I nelderly Will not plan to escalate dose or number of pills given in the future       Other Visit Diagnoses    Medicare annual wellness visit, subsequent    -  Primary   Encounter for annual physical exam       Relevant Orders   Lipid panel   Screening for breast cancer       Relevant Orders   MM 3D SCREEN BREAST BILATERAL   Need for pneumococcal vaccination       Relevant Orders   Pneumococcal conjugate vaccine 13-valent IM       Return in about 6 months (around 01/29/2020) for chronic disease f/u.   The entirety of the information documented in the History of Present Illness, Review of Systems and Physical Exam were personally  obtained by me. Portions of this information were initially documented by Tiburcio Pea, CMA and reviewed by me for thoroughness and accuracy.    Adi Doro, Dionne Bucy, MD MPH [facility]

## 2019-07-29 NOTE — Patient Instructions (Addendum)
Try Miralax daily to have regular, soft BMs Start with 1 capful in a glass of water daily Can titrate up or down as needed   Preventive Care 77 Years and Older, Female Preventive care refers to lifestyle choices and visits with your health care provider that can promote health and wellness. This includes:  A yearly physical exam. This is also called an annual well check.  Regular dental and eye exams.  Immunizations.  Screening for certain conditions.  Healthy lifestyle choices, such as diet and exercise. What can I expect for my preventive care visit? Physical exam Your health care provider will check:  Height and weight. These may be used to calculate body mass index (BMI), which is a measurement that tells if you are at a healthy weight.  Heart rate and blood pressure.  Your skin for abnormal spots. Counseling Your health care provider may ask you questions about:  Alcohol, tobacco, and drug use.  Emotional well-being.  Home and relationship well-being.  Sexual activity.  Eating habits.  History of falls.  Memory and ability to understand (cognition).  Work and work Statistician.  Pregnancy and menstrual history. What immunizations do I need?  Influenza (flu) vaccine  This is recommended every year. Tetanus, diphtheria, and pertussis (Tdap) vaccine  You may need a Td booster every 10 years. Varicella (chickenpox) vaccine  You may need this vaccine if you have not already been vaccinated. Zoster (shingles) vaccine  You may need this after age 45. Pneumococcal conjugate (PCV13) vaccine  One dose is recommended after age 59. Pneumococcal polysaccharide (PPSV23) vaccine  One dose is recommended after age 43. Measles, mumps, and rubella (MMR) vaccine  You may need at least one dose of MMR if you were born in 1957 or later. You may also need a second dose. Meningococcal conjugate (MenACWY) vaccine  You may need this if you have certain conditions.  Hepatitis A vaccine  You may need this if you have certain conditions or if you travel or work in places where you may be exposed to hepatitis A. Hepatitis B vaccine  You may need this if you have certain conditions or if you travel or work in places where you may be exposed to hepatitis B. Haemophilus influenzae type b (Hib) vaccine  You may need this if you have certain conditions. You may receive vaccines as individual doses or as more than one vaccine together in one shot (combination vaccines). Talk with your health care provider about the risks and benefits of combination vaccines. What tests do I need? Blood tests  Lipid and cholesterol levels. These may be checked every 5 years, or more frequently depending on your overall health.  Hepatitis C test.  Hepatitis B test. Screening  Lung cancer screening. You may have this screening every year starting at age 36 if you have a 30-pack-year history of smoking and currently smoke or have quit within the past 15 years.  Colorectal cancer screening. All adults should have this screening starting at age 34 and continuing until age 55. Your health care provider may recommend screening at age 44 if you are at increased risk. You will have tests every 1-10 years, depending on your results and the type of screening test.  Diabetes screening. This is done by checking your blood sugar (glucose) after you have not eaten for a while (fasting). You may have this done every 1-3 years.  Mammogram. This may be done every 1-2 years. Talk with your health care provider about how  often you should have regular mammograms.  BRCA-related cancer screening. This may be done if you have a family history of breast, ovarian, tubal, or peritoneal cancers. Other tests  Sexually transmitted disease (STD) testing.  Bone density scan. This is done to screen for osteoporosis. You may have this done starting at age 30. Follow these instructions at home: Eating  and drinking  Eat a diet that includes fresh fruits and vegetables, whole grains, lean protein, and low-fat dairy products. Limit your intake of foods with high amounts of sugar, saturated fats, and salt.  Take vitamin and mineral supplements as recommended by your health care provider.  Do not drink alcohol if your health care provider tells you not to drink.  If you drink alcohol: ? Limit how much you have to 0-1 drink a day. ? Be aware of how much alcohol is in your drink. In the [place] one drink equals one 12 oz bottle of beer (355 mL), one 5 oz glass of wine (148 mL), or one 1 oz glass of hard liquor (44 mL). Lifestyle  Take daily care of your teeth and gums.  Stay active. Exercise for at least 30 minutes on 5 or more days each week.  Do not use any products that contain nicotine or tobacco, such as cigarettes, e-cigarettes, and chewing tobacco. If you need help quitting, ask your health care provider.  If you are sexually active, practice safe sex. Use a condom or other form of protection in order to prevent STIs (sexually transmitted infections).  Talk with your health care provider about taking a low-dose aspirin or statin. What's next?  Go to your health care provider once a year for a well check visit.  Ask your health care provider how often you should have your eyes and teeth checked.  Stay up to date on all vaccines. This information is not intended to replace advice given to you by your health care provider. Make sure you discuss any questions you have with your health care provider. Document Released: 12/30/2015 Document Revised: 11/27/2018 Document Reviewed: 11/27/2018 Elsevier Patient Education  2020 [employer].

## 2019-07-29 NOTE — Assessment & Plan Note (Signed)
Doing well on Ambien 2.5mg  qhs prn when traveling or changing time zones Takes sparingly Discussed risks of hypersomnolence, falls, confusion I nelderly Will not plan to escalate dose or number of pills given in the future

## 2019-07-30 LAB — LIPID PANEL
Chol/HDL Ratio: 2.3 ratio (ref 0.0–4.4)
Cholesterol, Total: 156 mg/dL (ref 100–199)
HDL: 69 mg/dL (ref >39)
LDL Calculated: 71 mg/dL (ref 0–99)
Triglycerides: 78 mg/dL (ref 0–149)
VLDL Cholesterol Cal: 16 mg/dL (ref 5–40)

## 2019-09-16 ENCOUNTER — Other Ambulatory Visit: Payer: Self-pay

## 2019-09-16 DIAGNOSIS — Z20822 Contact with and (suspected) exposure to covid-19: Secondary | ICD-10-CM

## 2019-09-16 DIAGNOSIS — R6889 Other general symptoms and signs: Secondary | ICD-10-CM | POA: Diagnosis not present

## 2019-09-17 LAB — NOVEL CORONAVIRUS, NAA: SARS-CoV-2, NAA: NOT DETECTED

## 2019-09-18 ENCOUNTER — Other Ambulatory Visit: Payer: Self-pay | Admitting: Family Medicine

## 2019-10-06 ENCOUNTER — Other Ambulatory Visit: Payer: Self-pay

## 2019-10-06 ENCOUNTER — Encounter: Payer: Self-pay | Admitting: Family Medicine

## 2019-10-06 ENCOUNTER — Ambulatory Visit (INDEPENDENT_AMBULATORY_CARE_PROVIDER_SITE_OTHER): Payer: PPO | Admitting: Family Medicine

## 2019-10-06 VITALS — BP 152/86 | HR 61 | Temp 96.8°F | Wt 118.0 lb

## 2019-10-06 DIAGNOSIS — H43392 Other vitreous opacities, left eye: Secondary | ICD-10-CM

## 2019-10-06 DIAGNOSIS — I10 Essential (primary) hypertension: Secondary | ICD-10-CM | POA: Diagnosis not present

## 2019-10-06 DIAGNOSIS — M545 Low back pain, unspecified: Secondary | ICD-10-CM

## 2019-10-06 MED ORDER — MELOXICAM 15 MG PO TABS
7.5000 mg | ORAL_TABLET | Freq: Every day | ORAL | 0 refills | Status: DC
Start: 2019-10-06 — End: 2019-11-09

## 2019-10-06 MED ORDER — HYDROCHLOROTHIAZIDE 25 MG PO TABS: 25.0000 mg | ORAL_TABLET | Freq: Every day | ORAL | 1 refills | Status: DC

## 2019-10-06 NOTE — Progress Notes (Signed)
Patient: Alexis Lyons Female    DOB: 1941-09-26   78 y.o.   MRN: [id number] Visit Date: 10/06/2019  Today's Provider: Lavon Paganini, MD   Chief Complaint  Patient presents with  . Back Pain    Lower back left side.   . Hip Pain    Left side.  . Decreased Visual Acuity    Pt reports seeing a "black dot" in her left eye   Subjective:     Back Pain This is a new problem. The current episode started 1 to 4 weeks ago. The problem has been gradually improving (Since starting Meloxicam) since onset. The pain is present in the lumbar spine. The pain radiates to the left thigh. Exacerbated by: Walking. Pertinent negatives include no abdominal pain, bladder incontinence, bowel incontinence, headaches, leg pain, numbness, perianal numbness or weakness. She has tried NSAIDs for the symptoms. The treatment provided moderate relief.   Ongoing for ~1 month.  Meloxicam is helping and makes her able to do her job.  Job entales walking, lifting, twisting.  Taking Meloxicam for last 1 wk.  New black dot in vision.  There in the mornings, but resolves later in the day.  Sees Dr Ellin Mayhew in [place].    Allergies  Allergen Reactions  . Codeine      Current Outpatient Medications:  .  Ginseng 100 MG CAPS, every other day. , Disp: , Rfl:  .  hydrochlorothiazide (HYDRODIURIL) 12.5 MG tablet, TAKE 1 TABLET BY MOUTH ONCE DAILY, Disp: 90 tablet, Rfl: 3 .  zolpidem (AMBIEN) 5 MG tablet, TAKE 1/2 TABLET BY MOUTH AT BEDTIME AS NEEDED SLEEP, Disp: 15 tablet, Rfl: 0  Review of Systems  Constitutional: Negative.   Eyes: Positive for visual disturbance. Negative for photophobia, pain, discharge, redness and itching.  Gastrointestinal: Negative for abdominal pain and bowel incontinence.  Genitourinary: Negative for bladder incontinence.  Musculoskeletal: Positive for arthralgias and back pain. Negative for gait problem, joint swelling, myalgias, neck pain and neck stiffness.  Neurological:  Negative for weakness, numbness and headaches.    Social History   Tobacco Use  . Smoking status: Former Smoker    Packs/day: 0.50    Years: 15.00    Pack years: 7.50    Types: Cigarettes    Quit date: 12/16/1985    Years since quitting: 33.8  . Smokeless tobacco: Never Used  Substance Use Topics  . Alcohol use: Yes    Alcohol/week: 7.0 standard drinks    Types: 7 Glasses of wine per week    Comment: glass of wine with dinner      Objective:   BP (!) 152/86 (BP Location: Left Arm, Patient Position: Sitting, Cuff Size: Normal)   Pulse 61   Temp (!) 96.8 F (36 C) (Temporal)   Wt 118 lb (53.5 kg)   BMI 20.57 kg/m  Vitals:   10/06/19 1028 10/06/19 1039  BP: (!) 172/95 (!) 152/86  Pulse: 61   Temp: (!) 96.8 F (36 C)   TempSrc: Temporal   Weight: 118 lb (53.5 kg)   Body mass index is 20.57 kg/m.   Physical Exam Vitals signs reviewed.  Constitutional:      General: She is not in acute distress.    Appearance: Normal appearance. She is well-developed. She is not diaphoretic.  HENT:     Head: Normocephalic and atraumatic.  Eyes:     General: No scleral icterus.    Conjunctiva/sclera: Conjunctivae normal.  Neck:  Musculoskeletal: Neck supple.     Thyroid: No thyromegaly.  Cardiovascular:     Rate and Rhythm: Normal rate and regular rhythm.     Pulses: Normal pulses.     Heart sounds: Normal heart sounds. No murmur.  Pulmonary:     Effort: Pulmonary effort is normal. No respiratory distress.     Breath sounds: Normal breath sounds. No wheezing, rhonchi or rales.  Musculoskeletal:     Right lower leg: No edema.     Left lower leg: No edema.     Comments: Back: No midline tenderness to palpation.  Range of motion is intact.  Some tenderness to palpation of gluteus muscles and around SI joint of left side.  Negative straight leg raise bilaterally.  Strength and sensation to light touch intact in lower extremities.  Lymphadenopathy:     Cervical: No cervical  adenopathy.  Skin:    General: Skin is warm and dry.     Capillary Refill: Capillary refill takes less than 2 seconds.     Findings: No rash.  Neurological:     Mental Status: She is alert and oriented to person, place, and time. Mental status is at baseline.     Sensory: No sensory deficit.     Motor: No weakness.     Gait: Gait normal.  Psychiatric:        Mood and Affect: Mood normal.        Behavior: Behavior normal.      No results found for any visits on 10/06/19.     Assessment & Plan   1. Acute left-sided low back pain without sciatica -New problem -Reassured patient that she does not have any radiculopathy symptoms or symptoms of bulging disc -No red flag symptoms -No indication for x-rays at this time -Discussed ice/heat, anti-inflammatories (continue meloxicam), home exercise program given -Discussed return precautions  2. Essential hypertension Problem List Items Addressed This Visit      Cardiovascular and Mediastinum   Essential hypertension    Remains uncontrolled She is not checking home blood pressures-but I encouraged Increase HCTZ to 25 mg daily Reviewed last metabolic panel Follow-up in 1 month and consider further dose titration Repeat metabolic panel at next visit      Relevant Medications   hydrochlorothiazide (HYDRODIURIL) 25 MG tablet    3. Floaters in visual field, left -New problem -Advised to follow-up with her ophthalmologist    Meds ordered this encounter  Medications  . hydrochlorothiazide (HYDRODIURIL) 25 MG tablet    Sig: Take 1 tablet (25 mg total) by mouth daily.    Dispense:  30 tablet    Refill:  1  . meloxicam (MOBIC) 15 MG tablet    Sig: Take 0.5-1 tablets (7.5-15 mg total) by mouth daily.    Dispense:  30 tablet    Refill:  0     Return in about 4 weeks (around 11/03/2019) for BP f/u.   The entirety of the information documented in the History of Present Illness, Review of Systems and Physical Exam were  personally obtained by me. Portions of this information were initially documented by Ashley Royalty, CMA and reviewed by me for thoroughness and accuracy.    Bacigalupo, Dionne Bucy, MD MPH [facility]

## 2019-10-06 NOTE — Assessment & Plan Note (Signed)
Remains uncontrolled She is not checking home blood pressures-but I encouraged Increase HCTZ to 25 mg daily Reviewed last metabolic panel Follow-up in 1 month and consider further dose titration Repeat metabolic panel at next visit

## 2019-10-22 ENCOUNTER — Encounter: Payer: Self-pay | Admitting: Family Medicine

## 2019-10-22 ENCOUNTER — Other Ambulatory Visit: Payer: Self-pay

## 2019-10-22 ENCOUNTER — Ambulatory Visit (INDEPENDENT_AMBULATORY_CARE_PROVIDER_SITE_OTHER): Payer: PPO | Admitting: Family Medicine

## 2019-10-22 VITALS — BP 158/96 | HR 69 | Temp 97.3°F | Wt 119.0 lb

## 2019-10-22 DIAGNOSIS — H9313 Tinnitus, bilateral: Secondary | ICD-10-CM

## 2019-10-22 NOTE — Progress Notes (Signed)
Patient: Alexis Lyons Female    DOB: 25-Aug-1941   78 y.o.   MRN: [id number] Visit Date: 10/23/2019  Today's Provider: Lavon Paganini, MD   Chief Complaint  Patient presents with  . Tinnitus   Subjective:     HPI   Pt comes in complaining of "Swishing" in her head.  She states it was always there but seems louder in the last few weeks.   Describes it as hissing and not ringing. Doesn't feel like it is in her ears Tried to ignore it but it was louder over the last few weeks  No change in hearing Tried sinus decongestants Has not been checking her BP at home  Allergies  Allergen Reactions  . Codeine      Current Outpatient Medications:  .  Ginseng 100 MG CAPS, every other day. , Disp: , Rfl:  .  hydrochlorothiazide (HYDRODIURIL) 25 MG tablet, Take 1 tablet (25 mg total) by mouth daily., Disp: 30 tablet, Rfl: 1 .  zolpidem (AMBIEN) 5 MG tablet, TAKE 1/2 TABLET BY MOUTH AT BEDTIME AS NEEDED SLEEP, Disp: 15 tablet, Rfl: 0 .  meloxicam (MOBIC) 15 MG tablet, Take 0.5-1 tablets (7.5-15 mg total) by mouth daily. (Patient not taking: Reported on 10/22/2019), Disp: 30 tablet, Rfl: 0  Review of Systems  Constitutional: Negative.   HENT: Positive for tinnitus. Negative for congestion, drooling, ear discharge, ear pain, hearing loss, mouth sores, nosebleeds, postnasal drip, rhinorrhea, sinus pressure, sinus pain, sneezing, sore throat and voice change.   Respiratory: Negative.   Neurological: Negative for dizziness, light-headedness and headaches.    Social History   Tobacco Use  . Smoking status: Former Smoker    Packs/day: 0.50    Years: 15.00    Pack years: 7.50    Types: Cigarettes    Quit date: 12/16/1985    Years since quitting: 33.8  . Smokeless tobacco: Never Used  Substance Use Topics  . Alcohol use: Yes    Alcohol/week: 7.0 standard drinks    Types: 7 Glasses of wine per week    Comment: glass of wine with dinner      Objective:   BP (!) 158/96 (BP  Location: Left Arm, Patient Position: Sitting, Cuff Size: Normal)   Pulse 69   Temp (!) 97.3 F (36.3 C) (Temporal)   Wt 119 lb (54 kg)   BMI 20.75 kg/m  Vitals:   10/22/19 0908 10/22/19 0918  BP: (!) 175/102 (!) 158/96  Pulse: 69   Temp: (!) 97.3 F (36.3 C)   TempSrc: Temporal   Weight: 119 lb (54 kg)   Body mass index is 20.75 kg/m.   Physical Exam Vitals signs reviewed.  Constitutional:      General: She is not in acute distress.    Appearance: Normal appearance. She is well-developed. She is not diaphoretic.  HENT:     Head: Normocephalic and atraumatic.     Right Ear: Tympanic membrane, ear canal and external ear normal.     Left Ear: Tympanic membrane, ear canal and external ear normal.  Eyes:     General: No scleral icterus.    Conjunctiva/sclera: Conjunctivae normal.  Neck:     Musculoskeletal: Neck supple.     Thyroid: No thyromegaly.     Vascular: No carotid bruit.  Cardiovascular:     Rate and Rhythm: Normal rate and regular rhythm.     Pulses: Normal pulses.     Heart sounds: Normal heart sounds. No murmur.  Pulmonary:     Effort: Pulmonary effort is normal. No respiratory distress.     Breath sounds: Normal breath sounds. No wheezing, rhonchi or rales.  Musculoskeletal:     Right lower leg: No edema.     Left lower leg: No edema.  Lymphadenopathy:     Cervical: No cervical adenopathy.  Skin:    General: Skin is warm and dry.     Capillary Refill: Capillary refill takes less than 2 seconds.     Findings: No rash.  Neurological:     Mental Status: She is alert and oriented to person, place, and time. Mental status is at baseline.  Psychiatric:        Mood and Affect: Mood normal.        Behavior: Behavior normal.      No results found for any visits on 10/22/19.     Assessment & Plan   1. Tinnitus of both ears - long-standing problem, but worsening in last 2 weeks - advised that elevated BP can cause this and the need to monitor home BP,  take antihypertensives, and avoid meds that raise BP (like decongestants) - BP f/u visit in 2 weeks already scheduled - can try flonase instead if she has some sinus congestion - referral to ENT for further eval and management - Ambulatory referral to ENT    Return if symptoms worsen or fail to improve.   The entirety of the information documented in the History of Present Illness, Review of Systems and Physical Exam were personally obtained by me. Portions of this information were initially documented by Ashley Royalty, CMA and reviewed by me for thoroughness and accuracy.    , Dionne Bucy, MD MPH [facility]

## 2019-10-22 NOTE — Patient Instructions (Signed)
Tinnitus Tinnitus refers to hearing a sound when there is no actual source for that sound. This is often described as ringing in the ears. However, people with this condition may hear a variety of noises, in one ear or in both ears. The sounds of tinnitus can be soft, loud, or somewhere in between. Tinnitus can last for a few seconds or can be constant for days. It may go away without treatment and come back at various times. When tinnitus is constant or happens often, it can lead to other problems, such as trouble sleeping and trouble concentrating. Almost everyone experiences tinnitus at some point. Tinnitus that is long-lasting (chronic) or comes back often (recurs) may require medical attention. What are the causes? The cause of tinnitus is often not known. In some cases, it can result from other problems or conditions, including:  Exposure to loud noises from machinery, music, or other sources.  Hearing loss.  Ear or sinus infections.  Earwax buildup.  An object (foreign body) stuck in the ear.  Taking certain medicines.  Drinking alcohol or caffeine.  High blood pressure.  Heart diseases.  Anemia.  Allergies.  Meniere's disease.  Thyroid problems.  Tumors.  A weak, bulging blood vessel (aneurysm) near the ear.  Depression or other mood disorders. What are the signs or symptoms? The main symptom of tinnitus is hearing a sound when there is no source for that sound. It may sound like:  Buzzing.  Roaring.  Ringing.  Blowing air, like the sound heard when you listen to a seashell.  Hissing.  Whistling.  Sizzling.  Humming.  Running water.  A musical note.  Tapping. Symptoms may affect only one ear (unilateral) or both ears (bilateral). How is this diagnosed? Tinnitus is diagnosed based on your symptoms, your medical history, and a physical exam. Your health care provider may do a thorough hearing test (audiologic exam) if your tinnitus:  Is  unilateral.  Causes hearing difficulties.  Lasts 6 months or longer. You may work with a health care provider who specializes in hearing disorders (audiologist). You may be asked questions about your symptoms and how they affect your daily life. You may have other tests done, such as:  CT scan.  MRI.  An imaging test of how blood flows through your blood vessels (angiogram). How is this treated? Treating an underlying medical condition can sometimes make tinnitus go away. If your tinnitus continues, other treatments may include:  Medicines, such as antidepressants or sleeping aids.  Sound generators to mask the tinnitus. These include: ? Tabletop sound machines that play relaxing sounds to help you fall asleep. ? Wearable devices that fit in your ear and play sounds or music. ? Acoustic neural stimulation. This involves using headphones to listen to music that contains an auditory signal. Over time, listening to this signal may change some pathways in your brain and make you less sensitive to tinnitus. This treatment is used for very severe cases when no other treatment is working.  Therapy and counseling to help you manage the stress of living with tinnitus.  Using hearing aids or cochlear implants if your tinnitus is related to hearing loss. Hearing aids are worn in the outer ear. Cochlear implants are surgically placed in the inner ear. Follow these instructions at home: Managing symptoms      When possible, avoid being in loud places and being exposed to loud sounds.  Wear hearing protection, such as earplugs, when you are exposed to loud noises.  Use   a white noise machine, a humidifier, or other devices to mask the sound of tinnitus.  Practice techniques for reducing stress, such as meditation, yoga, or deep breathing. Work with your health care provider if you need help with managing stress.  Sleep with your head slightly raised. This may reduce the impact of tinnitus.  General instructions  Do not use stimulants, such as nicotine, alcohol, or caffeine. Talk with your health care provider about other stimulants to avoid. Stimulants are substances that can make you feel alert and attentive by increasing certain activities in the body (such as heart rate and blood pressure). These substances may make tinnitus worse.  Take over-the-counter and prescription medicines only as told by your health care provider.  Try to get plenty of sleep each night.  Keep all follow-up visits as told by your health care provider. This is important. Contact a health care provider if:  Your tinnitus continues for 3 weeks or longer without stopping.  Your symptoms get worse or do not get better with home care.  You develop tinnitus after a head injury.  You have tinnitus along with any of the following: ? Dizziness. ? Loss of balance. ? Nausea and vomiting. Summary  Tinnitus refers to hearing a sound when there is no actual source for that sound. This is often described as ringing in the ears.  Symptoms may affect only one ear (unilateral) or both ears (bilateral).  Use a white noise machine, a humidifier, or other devices to mask the sound of tinnitus.  Do not use stimulants, such as nicotine, alcohol, or caffeine. Talk with your health care provider about other stimulants to avoid. These substances may make tinnitus worse. This information is not intended to replace advice given to you by your health care provider. Make sure you discuss any questions you have with your health care provider. Document Released: 12/03/2005 Document Revised: 11/15/2017 Document Reviewed: 09/12/2017 Elsevier Patient Education  2020 [employer]  

## 2019-11-09 ENCOUNTER — Other Ambulatory Visit: Payer: Self-pay

## 2019-11-09 ENCOUNTER — Ambulatory Visit (INDEPENDENT_AMBULATORY_CARE_PROVIDER_SITE_OTHER): Payer: PPO | Admitting: Family Medicine

## 2019-11-09 ENCOUNTER — Encounter: Payer: Self-pay | Admitting: Family Medicine

## 2019-11-09 DIAGNOSIS — I10 Essential (primary) hypertension: Secondary | ICD-10-CM | POA: Diagnosis not present

## 2019-11-09 MED ORDER — LISINOPRIL 20 MG PO TABS: 20.0000 mg | ORAL_TABLET | Freq: Every day | ORAL | 3 refills | Status: DC

## 2019-11-09 NOTE — Assessment & Plan Note (Signed)
Remains uncontrolled Encouraged home blood pressure monitoring and bring her cuff to the next visit Continue HCTZ 25 mg daily and add lisinopril 20 mg daily Reviewed last metabolic panel Follow-up in 6 weeks and consider further dose titration Discussed possible side effects Repeat metabolic panel at next visit

## 2019-11-09 NOTE — Progress Notes (Signed)
Patient: Alexis Lyons Female    DOB: [birth year]   78 y.o.   MRN: [id number] Visit Date: 11/09/2019  Today's Provider: Lavon Paganini, MD   Chief Complaint  Patient presents with  . Hypertension   Subjective:     HPI  Hypertension, follow-up:  BP Readings from Last 3 Encounters:  11/09/19 (!) 146/92  10/22/19 (!) 158/96  10/06/19 (!) 152/86    She was last seen for hypertension 1 months ago.  BP at that visit was 158/96. Management changes since that visit include increased HCTZ to 25 mg daily. She reports good compliance with treatment. She is not having side effects.  She is exercising. She is adherent to low salt diet.   Outside blood pressures are being checked occasionally. She is experiencing none.  Patient denies chest pain, chest pressure/discomfort, irregular heart beat and palpitations.   Cardiovascular risk factors include advanced age (older than 41 for men, 3 for women) and hypertension.  Use of agents associated with hypertension: none.     Weight trend: stable Wt Readings from Last 3 Encounters:  11/09/19 120 lb (54.4 kg)  10/22/19 119 lb (54 kg)  10/06/19 118 lb (53.5 kg)    Current diet: in general, a "healthy" diet    ------------------------------------------------------------------------  Allergies  Allergen Reactions  . Codeine      Current Outpatient Medications:  .  Ginseng 100 MG CAPS, every other day. , Disp: , Rfl:  .  hydrochlorothiazide (HYDRODIURIL) 25 MG tablet, Take 1 tablet (25 mg total) by mouth daily., Disp: 30 tablet, Rfl: 1 .  zolpidem (AMBIEN) 5 MG tablet, TAKE 1/2 TABLET BY MOUTH AT BEDTIME AS NEEDED SLEEP, Disp: 15 tablet, Rfl: 0 .  meloxicam (MOBIC) 15 MG tablet, Take 0.5-1 tablets (7.5-15 mg total) by mouth daily. (Patient not taking: Reported on 10/22/2019), Disp: 30 tablet, Rfl: 0  Review of Systems  Constitutional: Negative.   Respiratory: Negative.   Cardiovascular: Negative.   Musculoskeletal:  Negative.     Social History   Tobacco Use  . Smoking status: Former Smoker    Packs/day: 0.50    Years: 15.00    Pack years: 7.50    Types: Cigarettes    Quit date: 12/16/1985    Years since quitting: 33.9  . Smokeless tobacco: Never Used  Substance Use Topics  . Alcohol use: Yes    Alcohol/week: 7.0 standard drinks    Types: 7 Glasses of wine per week    Comment: glass of wine with dinner      Objective:   BP (!) 146/92 (BP Location: Left Arm, Patient Position: Sitting, Cuff Size: Normal)   Pulse (!) 56   Temp 97.6 F (36.4 C) (Oral)   Wt 120 lb (54.4 kg)   SpO2 99%   BMI 20.92 kg/m  Vitals:   11/09/19 1052 11/09/19 1053  BP: (!) 152/78 (!) 146/92  Pulse: (!) 56   Temp: 97.6 F (36.4 C)   TempSrc: Oral   SpO2: 99%   Weight: 120 lb (54.4 kg)   Body mass index is 20.92 kg/m.   Physical Exam Vitals signs reviewed.  Constitutional:      General: She is not in acute distress.    Appearance: Normal appearance. She is well-developed. She is not diaphoretic.  HENT:     Head: Normocephalic and atraumatic.  Eyes:     General: No scleral icterus.    Conjunctiva/sclera: Conjunctivae normal.  Neck:     Musculoskeletal:  Neck supple.     Thyroid: No thyromegaly.  Cardiovascular:     Rate and Rhythm: Normal rate and regular rhythm.     Pulses: Normal pulses.     Heart sounds: Normal heart sounds. No murmur.  Pulmonary:     Effort: Pulmonary effort is normal. No respiratory distress.     Breath sounds: Normal breath sounds. No wheezing, rhonchi or rales.  Musculoskeletal:     Right lower leg: No edema.     Left lower leg: No edema.  Lymphadenopathy:     Cervical: No cervical adenopathy.  Skin:    General: Skin is warm and dry.     Capillary Refill: Capillary refill takes less than 2 seconds.     Findings: No rash.  Neurological:     Mental Status: She is alert and oriented to person, place, and time. Mental status is at baseline.  Psychiatric:        Mood  and Affect: Mood normal.        Behavior: Behavior normal.      No results found for any visits on 11/09/19.     Assessment & Plan   Problem List Items Addressed This Visit      Cardiovascular and Mediastinum   Essential hypertension    Remains uncontrolled Encouraged home blood pressure monitoring and bring her cuff to the next visit Continue HCTZ 25 mg daily and add lisinopril 20 mg daily Reviewed last metabolic panel Follow-up in 6 weeks and consider further dose titration Discussed possible side effects Repeat metabolic panel at next visit      Relevant Medications   lisinopril (ZESTRIL) 20 MG tablet       Return in about 6 weeks (around 12/21/2019) for chronic disease f/u.   The entirety of the information documented in the History of Present Illness, Review of Systems and Physical Exam were personally obtained by me. Portions of this information were initially documented by Tiburcio Pea, CMA and reviewed by me for thoroughness and accuracy.    Guneet Delpino, Dionne Bucy, MD MPH [facility]

## 2019-11-09 NOTE — Patient Instructions (Signed)

## 2019-11-10 DIAGNOSIS — H903 Sensorineural hearing loss, bilateral: Secondary | ICD-10-CM | POA: Diagnosis not present

## 2019-11-10 DIAGNOSIS — J301 Allergic rhinitis due to pollen: Secondary | ICD-10-CM | POA: Diagnosis not present

## 2019-11-10 DIAGNOSIS — H9319 Tinnitus, unspecified ear: Secondary | ICD-10-CM | POA: Diagnosis not present

## 2019-11-30 ENCOUNTER — Ambulatory Visit: Payer: Self-pay | Admitting: *Deleted

## 2019-11-30 ENCOUNTER — Other Ambulatory Visit: Payer: Self-pay

## 2019-11-30 ENCOUNTER — Ambulatory Visit
Admission: EM | Admit: 2019-11-30 | Discharge: 2019-11-30 | Disposition: A | Discharge status: Home / Self Care | Payer: PPO | Attending: Emergency Medicine | Admitting: Emergency Medicine

## 2019-11-30 DIAGNOSIS — Z23 Encounter for immunization: Secondary | ICD-10-CM

## 2019-11-30 DIAGNOSIS — L03113 Cellulitis of right upper limb: Secondary | ICD-10-CM | POA: Diagnosis not present

## 2019-11-30 MED ORDER — CEPHALEXIN 500 MG PO CAPS: 500.0000 mg | ORAL_CAPSULE | Freq: Four times a day (QID) | ORAL | 0 refills | Status: AC

## 2019-11-30 MED ORDER — CEPHALEXIN 500 MG PO CAPS: 500.0000 mg | ORAL_CAPSULE | Freq: Four times a day (QID) | ORAL | 0 refills | Status: DC

## 2019-11-30 MED ORDER — TETANUS-DIPHTH-ACELL PERTUSSIS 5-2.5-18.5 LF-MCG/0.5 IM SUSP
0.5000 mL | Freq: Once | INTRAMUSCULAR | Status: AC
  Administered 2019-11-30: 13:00:00 0.5 mL via INTRAMUSCULAR

## 2019-11-30 NOTE — Telephone Encounter (Signed)
From [facility] 

## 2019-11-30 NOTE — Telephone Encounter (Signed)
Laceration on the top of right hand below the knuckles, maybe about an inch long. This occurred just over a week ago while opening a door-could not remember rust/dirt details of the door. Washed the wound that day and nothing further until the last 2 days. Area now bright red, edematous, painful to move her hand, tingling in fingers.Last Tetanus  2009.  No PCP appointments available today. Advised to UC for evaluation and treatment.   Reason for Disposition . Wound causes numbness (i.e., loss of sensation)  Answer Assessment - Initial Assessment Questions 1. APPEARANCE of INJURY: "What does the injury look like?"     About an inch in length. Red and very cool to touch 2. SIZE: "How large is the cut?"     About an inch. 3. BLEEDING: "Is it bleeding now?" If so, ask: "Is it difficult to stop?"      No 4. LOCATION: "Where is the injury located?"     Top of right hand below the knuckles. 5. ONSET: "How long ago did the injury occur?"      just over one week go. 6. MECHANISM: "Tell me how it happened."      Hand/skin caught on door.7. TETANUS: "When was the last tetanus booster?"    12 years ago.  8. PREGNANCY: "Is there any chance you are pregnant?" "When was your last menstrual period?"     na  Protocols used: [place]

## 2019-11-30 NOTE — ED Triage Notes (Signed)
Pt presents to the UC with skin infection in her right hand x 1 week. Pt states she cut her hand 1 week ago and is swelling and infected.

## 2019-11-30 NOTE — ED Provider Notes (Signed)
Alexis Lyons    CSN: [id number] Arrival date & time: 11/30/19  1232      History   Chief Complaint Chief Complaint  Patient presents with  . Skin Problem    HPI Alexis Lyons is a 78 y.o. female.   Patient presents with a cut on her right hand x1 week.  She states she cut it on a doorframe while opening a door.  She reports the wound is red and swollen x 3 days.  She denies fever, chills, drainage from the wound, red streaks, numbness, weakness, paresthesias, or other symptoms.  She reports her tetanus was >10 years ago.  She has been treating her cut at home with Neosporin.    The history is provided by the patient.    Past Medical History:  Diagnosis Date  . Amnesia   . Aphthae   . Congestion-fibrosis syndrome   . DD (diverticular disease)   . GERD (gastroesophageal reflux disease)   . Seizures ([place]) 12/2017   passed out due to dehydration    Patient Active Problem List   Diagnosis Date Noted  . Chronic headache 01/01/2019  . Essential hypertension 07/09/2018  . Osteopenia 11/06/2017  . Seborrheic dermatitis 11/05/2017  . Aphthae 05/28/2015  . DD (diverticular disease) 05/28/2015  . Fatigue 05/28/2015  . Hernia, inguinal, right 05/28/2015  . Amnesia 05/28/2015  . 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 Atrophy of vagina 05/28/2015  . Vaginal lesion 09/21/2008  . Acid reflux 06/15/2008  . Insomnia 12/17/2004    Past Surgical History:  Procedure Laterality Date  . CHOLECYSTECTOMY    . colonoscopy with polypectomy     8/11 4 mm polyp, 12 mm polyp  . COLONOSCOPY WITH PROPOFOL N/A 02/24/2018   Procedure: COLONOSCOPY WITH PROPOFOL;  Surgeon: Manya Silvas, MD;  Location: [facility] ENDOSCOPY;  Service: Endoscopy;  Laterality: N/A;  . HERNIA REPAIR Right 2004   inguinal   . HERNIA REPAIR     x's 3    OB History    Gravida  2   Para  2   Term      Preterm      AB      Living        SAB      TAB      Ectopic      Multiple        Live Births               Home Medications    Prior to Admission medications   Medication Sig Start Date End Date Taking? Authorizing Provider  cephALEXin (KEFLEX) 500 MG capsule Take 1 capsule (500 mg total) by mouth 4 (four) times daily for 7 days. 11/30/19 12/07/19  Sharion Balloon, NP  Ginseng 100 MG CAPS every other day.  06/08/09   [provider]  hydrochlorothiazide (HYDRODIURIL) 25 MG tablet Take 1 tablet (25 mg total) by mouth daily. 10/06/19   Virginia Crews, MD  lisinopril (ZESTRIL) 20 MG tablet Take 1 tablet (20 mg total) by mouth daily. 11/09/19   Virginia Crews, MD  zolpidem (AMBIEN) 5 MG tablet TAKE 1/2 TABLET BY MOUTH AT BEDTIME AS NEEDED SLEEP 05/13/19   Bacigalupo, Dionne Bucy, MD    Family History Family History  Problem Relation Age of Onset  . Alzheimer's disease Mother   . Alcohol abuse Father   . Cancer Father        liver cancer  . Cancer Sister  breast  . Breast cancer Sister     Social History Social History   Tobacco Use  . Smoking status: Former Smoker    Packs/day: 0.50    Years: 15.00    Pack years: 7.50    Types: Cigarettes    Quit date: 12/16/1985    Years since quitting: 33.9  . Smokeless tobacco: Never Used  Substance Use Topics  . Alcohol use: Yes    Alcohol/week: 7.0 standard drinks    Types: 7 Glasses of wine per week    Comment: glass of wine with dinner  . Drug use: No     Allergies   Codeine   Review of Systems Review of Systems  Constitutional: Negative for chills and fever.  HENT: Negative for ear pain and sore throat.   Eyes: Negative for pain and visual disturbance.  Respiratory: Negative for cough and shortness of breath.   Cardiovascular: Negative for chest pain and palpitations.  Gastrointestinal: Negative for abdominal pain and vomiting.  Genitourinary: Negative for dysuria and hematuria.  Musculoskeletal: Negative for arthralgias and back pain.  Skin: Positive for wound. Negative  for color change and rash.  Neurological: Negative for seizures and syncope.  All other systems reviewed and are negative.    Physical Exam Triage Vital Signs ED Triage Vitals  Enc Vitals Group     BP      Pulse      Resp      Temp      Temp src      SpO2      Weight      Height      Head Circumference      Peak Flow      Pain Score      Pain Loc      Pain Edu?      Excl. in [place]?    No data found.  Updated Vital Signs BP 133/88 (BP Location: Left Arm)   Pulse 63   Temp 98.3 F (36.8 C) (Temporal)   Resp 14   SpO2 95%   Visual Acuity Right Eye Distance:   Left Eye Distance:   Bilateral Distance:    Right Eye Near:   Left Eye Near:    Bilateral Near:     Physical Exam Vitals and nursing note reviewed.  Constitutional:      General: She is not in acute distress.    Appearance: She is well-developed. She is not ill-appearing.  HENT:     Head: Normocephalic and atraumatic.     Mouth/Throat:     Mouth: Mucous membranes are moist.     Pharynx: Oropharynx is clear.  Eyes:     Conjunctiva/sclera: Conjunctivae normal.  Cardiovascular:     Rate and Rhythm: Normal rate and regular rhythm.     Heart sounds: No murmur.  Pulmonary:     Effort: Pulmonary effort is normal. No respiratory distress.     Breath sounds: Normal breath sounds.  Abdominal:     General: Bowel sounds are normal.     Palpations: Abdomen is soft.     Tenderness: There is no abdominal tenderness. There is no guarding or rebound.  Musculoskeletal:       Hands:     Cervical back: Neck supple.  Skin:    General: Skin is warm and dry.     Capillary Refill: Capillary refill takes less than 2 seconds.     Findings: Erythema and lesion present.     Comments: 2  cm closed laceration on right hand with surrounding erythema. No drainage. No red streaks.    Neurological:     General: No focal deficit present.     Mental Status: She is alert and oriented to person, place, and time.     Sensory: No  sensory deficit.     Motor: No weakness.  Psychiatric:        Mood and Affect: Mood normal.        Behavior: Behavior normal.      UC Treatments / Results  Labs (all labs ordered are listed, but only abnormal results are displayed) Labs Reviewed - No data to display  EKG   Radiology No results found.  Procedures Procedures (including critical care time)  Medications Ordered in UC Medications  Tdap (BOOSTRIX) injection 0.5 mL (has no administration in time range)    Initial Impression / Assessment and Plan / UC Course  I have reviewed the triage vital signs and the nursing notes.  Pertinent labs & imaging results that were available during my care of the patient were reviewed by me and considered in my medical decision making (see chart for details).    Cellulitis of right hand.  Tetanus updated today.  Treating with Keflex.  Wound care instructions and signs of infection discussed with patient at length.  Instructed her to return here if she notes signs of worsening infection.  Instructed her to follow-up with her PCP as needed for a wound recheck.  Patient agrees to plan of care.   Final Clinical Impressions(s) / UC Diagnoses   Final diagnoses:  Cellulitis of right upper extremity     Discharge Instructions     You received a tetanus shot today.    Take the antibiotic as directed.  Keep your wound clean and dry.  Wash it gently twice a day with soap and water.  Apply an antibiotic cream twice a day.    Return here if you see signs of infection, such as increased pain, redness, pus-like drainage, warmth, fever, chills, or other concerning symptoms.        ED Prescriptions    Medication Sig Dispense Auth. Provider   cephALEXin (KEFLEX) 500 MG capsule  (Status: Discontinued) Take 1 capsule (500 mg total) by mouth 4 (four) times daily. 20 capsule Sharion Balloon, NP   cephALEXin (KEFLEX) 500 MG capsule Take 1 capsule (500 mg total) by mouth 4 (four) times daily  for 7 days. 28 capsule Sharion Balloon, NP     PDMP not reviewed this encounter.   Sharion Balloon, NP 11/30/19 [phone]

## 2019-11-30 NOTE — Discharge Instructions (Signed)
You received a tetanus shot today.    Take the antibiotic as directed.  Keep your wound clean and dry.  Wash it gently twice a day with soap and water.  Apply an antibiotic cream twice a day.    Return here if you see signs of infection, such as increased pain, redness, pus-like drainage, warmth, fever, chills, or other concerning symptoms.

## 2019-12-07 ENCOUNTER — Ambulatory Visit (INDEPENDENT_AMBULATORY_CARE_PROVIDER_SITE_OTHER): Payer: PPO | Admitting: Family Medicine

## 2019-12-07 ENCOUNTER — Other Ambulatory Visit: Payer: Self-pay

## 2019-12-07 ENCOUNTER — Encounter: Payer: Self-pay | Admitting: Family Medicine

## 2019-12-07 ENCOUNTER — Ambulatory Visit
Admission: RE | Admit: 2019-12-07 | Discharge: 2019-12-07 | Disposition: A | Discharge status: Home / Self Care | Payer: PPO | Source: Ambulatory Visit | Attending: Family Medicine | Admitting: Family Medicine

## 2019-12-07 VITALS — BP 124/70 | HR 70 | Temp 97.6°F | Resp 16 | Wt 118.0 lb

## 2019-12-07 DIAGNOSIS — M7989 Other specified soft tissue disorders: Secondary | ICD-10-CM | POA: Diagnosis not present

## 2019-12-07 DIAGNOSIS — S6991XA Unspecified injury of right wrist, hand and finger(s), initial encounter: Secondary | ICD-10-CM | POA: Diagnosis not present

## 2019-12-07 DIAGNOSIS — M79641 Pain in right hand: Secondary | ICD-10-CM | POA: Diagnosis not present

## 2019-12-07 DIAGNOSIS — L03113 Cellulitis of right upper limb: Secondary | ICD-10-CM

## 2019-12-07 MED ORDER — SULFAMETHOXAZOLE-TRIMETHOPRIM 800-160 MG PO TABS: 1.0000 | ORAL_TABLET | Freq: Two times a day (BID) | ORAL | 0 refills | Status: AC

## 2019-12-07 NOTE — Patient Instructions (Signed)

## 2019-12-07 NOTE — Progress Notes (Signed)
Patient: Alexis Lyons Female    DOB: 1941/09/28   78 y.o.   MRN: [id number] Visit Date: 12/07/2019  Today's Provider: Lavon Paganini, MD   Chief Complaint  Patient presents with  . Hand Pain   Subjective:    I [place] S. Dimas, CMA, am acting as scribe for Lavon Paganini, MD.  HPI Patient here today C/O right hand pain, redness and swelling. Patient reports she cut her hand on a door about 2 weeks ago. Patient reports she did go to Urgent care (12/14) and was prescribed Keflex 500 mg.  She was given a tetanus vaccine at that time.  (Records reviewed from that visit independently)  Since that time, redness and swelling of dorsum of R hand (R hand dominant) have persisted.  She has more pain now with wrist and finger movements.  She did not have an x-ray at the time of her urgent care visit to check for foreign bodies.   Allergies  Allergen Reactions  . Codeine      Current Outpatient Medications:  .  cephALEXin (KEFLEX) 500 MG capsule, Take 1 capsule (500 mg total) by mouth 4 (four) times daily for 7 days., Disp: 28 capsule, Rfl: 0 .  Ginseng 100 MG CAPS, every other day. , Disp: , Rfl:  .  hydrochlorothiazide (HYDRODIURIL) 25 MG tablet, Take 1 tablet (25 mg total) by mouth daily., Disp: 30 tablet, Rfl: 1 .  zolpidem (AMBIEN) 5 MG tablet, TAKE 1/2 TABLET BY MOUTH AT BEDTIME AS NEEDED SLEEP, Disp: 15 tablet, Rfl: 0 .  lisinopril (ZESTRIL) 20 MG tablet, Take 1 tablet (20 mg total) by mouth daily. (Patient not taking: Reported on 12/07/2019), Disp: 30 tablet, Rfl: 3  Review of Systems  Constitutional: Negative.   Cardiovascular: Negative.   Skin: Positive for color change and wound.    Social History   Tobacco Use  . Smoking status: Former Smoker    Packs/day: 0.50    Years: 15.00    Pack years: 7.50    Types: Cigarettes    Quit date: 12/16/1985    Years since quitting: 33.9  . Smokeless tobacco: Never Used  Substance Use Topics  . Alcohol use: Yes    Alcohol/week: 7.0 standard drinks    Types: 7 Glasses of wine per week    Comment: glass of wine with dinner      Objective:   BP 124/70 (BP Location: Left Arm, Patient Position: Sitting, Cuff Size: Normal)   Pulse 70   Temp 97.6 F (36.4 C) (Temporal)   Resp 16   Wt 118 lb (53.5 kg)   BMI 20.57 kg/m  Vitals:   12/07/19 1519  BP: 124/70  Pulse: 70  Resp: 16  Temp: 97.6 F (36.4 C)  TempSrc: Temporal  Weight: 118 lb (53.5 kg)  Body mass index is 20.57 kg/m.   Physical Exam Vitals reviewed.  Constitutional:      General: She is not in acute distress.    Appearance: She is well-developed.  HENT:     Head: Normocephalic and atraumatic.  Eyes:     General: No scleral icterus.    Conjunctiva/sclera: Conjunctivae normal.  Cardiovascular:     Rate and Rhythm: Normal rate and regular rhythm.  Pulmonary:     Effort: Pulmonary effort is normal. No respiratory distress.  Musculoskeletal:     Comments: Close laceration of the dorsum of the right hand that is scabbed over and minimal at this time.  3  to 4 inches of erythema surrounds the wound and there is some fluctuance.  Notable swelling of the dorsum of the hand and wrist  Skin:    General: Skin is warm and dry.     Capillary Refill: Capillary refill takes less than 2 seconds.  Neurological:     Mental Status: She is alert and oriented to person, place, and time.  Psychiatric:        Behavior: Behavior normal.      No results found for any visits on 12/07/19.     Assessment & Plan    1. Cellulitis of right hand -Patient initially evaluated at urgent care about 1 week after her injury and treated with Keflex and given a tetanus shot -Patient has now finished all but 1 dose of the Keflex and the erythema and swelling persists -There is some fluctuance under the wound, concerning for possible abscess in the deep spaces of her hand -He also has some pain with range of motion of the hand -She has no notable acute  tenosynovitis -We will obtain x-ray to rule out any foreign body -We will switch to Bactrim for broader coverage antibiotic x7 days -Referral to hand surgery in case this is not getting better to ensure that there is no deep space infection -Discussed return/emergent precautions - DG Hand Complete Right; Future - Ambulatory referral to Hand Surgery   Meds ordered this encounter  Medications  . sulfamethoxazole-trimethoprim (BACTRIM DS) 800-160 MG tablet    Sig: Take 1 tablet by mouth 2 (two) times daily for 7 days.    Dispense:  14 tablet    Refill:  0     Return if symptoms worsen or fail to improve.   The entirety of the information documented in the History of Present Illness, Review of Systems and Physical Exam were personally obtained by me. Portions of this information were initially documented by Lynford Humphrey, CMA and reviewed by me for thoroughness and accuracy.    Bacigalupo, Dionne Bucy, MD MPH [facility]

## 2019-12-08 ENCOUNTER — Telehealth: Payer: Self-pay

## 2019-12-08 NOTE — Telephone Encounter (Signed)
Pt advised.   Thanks,   -Abir Craine  

## 2019-12-08 NOTE — Telephone Encounter (Signed)
-----   Message from Virginia Crews, MD sent at 12/08/2019 10:52 AM EST ----- No foreign body or bony abnormality.  Swelling is noted.  Plan as we discussed during the visit with antibiotic and referral to hand surgeon

## 2019-12-21 ENCOUNTER — Other Ambulatory Visit: Payer: Self-pay | Admitting: Family Medicine

## 2019-12-21 DIAGNOSIS — F5102 Adjustment insomnia: Secondary | ICD-10-CM

## 2019-12-21 NOTE — Telephone Encounter (Signed)
Requested medication (s) are due for refill today: yes  Requested medication (s) are on the active medication list: yes  Last refill:  05/13/19  Future visit scheduled: yes  Notes to clinic:  medication not delegated to NT to refill.   Requested Prescriptions  Pending Prescriptions Disp Refills   zolpidem (AMBIEN) 5 MG tablet [Pharmacy Med Name: ZOLPIDEM TARTRATE 5 MG TAB] 15 tablet     Sig: TAKE 1/2 TABLET BY MOUTH AT BEDTIME AS NEEDED SLEEP      Not Delegated - Psychiatry:  Anxiolytics/Hypnotics Failed - 12/21/2019 11:36 AM      Failed - This refill cannot be delegated      Failed - Urine Drug Screen completed in last 360 days.      Passed - Valid encounter within last 6 months    Recent Outpatient Visits           2 weeks ago Cellulitis of right hand   [facility] [place], Dionne Bucy, MD   1 month ago Essential hypertension   [place], Dionne Bucy, MD   2 months ago Tinnitus of both [place], Dionne Bucy, MD   2 months ago Acute left-sided low back pain without sciatica   [facility] [place], Dionne Bucy, MD   4 months ago Medicare annual wellness visit, subsequent   [facility], Dionne Bucy, MD       Future Appointments             In 1 week Bacigalupo, Dionne Bucy, MD [facility]             Signed Prescriptions Disp Refills   hydrochlorothiazide (HYDRODIURIL) 25 MG tablet 30 tablet 1    Sig: TAKE 1 TABLET BY MOUTH ONCE DAILY      Cardiovascular: Diuretics - Thiazide Passed - 12/21/2019 11:36 AM      Passed - Ca in normal range and within 360 days    Calcium  Date Value Ref Range Status  01/01/2019 9.5 8.7 - 10.3 mg/dL Final          Passed - Cr in normal range and within 360 days    Creatinine  Date Value Ref Range Status  07/07/2019 0.9 0.5 - 1.1 Final   Creatinine, Ser  Date Value Ref Range Status  01/01/2019 0.73  0.57 - 1.00 mg/dL Final          Passed - K in normal range and within 360 days    Potassium  Date Value Ref Range Status  07/07/2019 3.6 3.4 - 5.3 Final          Passed - Na in normal range and within 360 days    Sodium  Date Value Ref Range Status  07/07/2019 141 137 - 147 Final          Passed - Last BP in normal range    BP Readings from Last 1 Encounters:  12/07/19 124/70          Passed - Valid encounter within last 6 months    Recent Outpatient Visits           2 weeks ago Cellulitis of right hand   [facility] [place], Dionne Bucy, MD   1 month ago Essential hypertension   [place], Dionne Bucy, MD   2 months ago Tinnitus of both ears   [facility] [place], Dionne Bucy, MD  2 months ago Acute left-sided low back pain without sciatica   [facility] [place], Dionne Bucy, MD   4 months ago Medicare annual wellness visit, subsequent   [place], Dionne Bucy, MD       Future Appointments             In 1 week Bacigalupo, Dionne Bucy, MD [facility] [place]

## 2019-12-21 NOTE — Telephone Encounter (Signed)
Requested Prescriptions  Pending Prescriptions Disp Refills  . zolpidem (AMBIEN) 5 MG tablet [Pharmacy Med Name: ZOLPIDEM TARTRATE 5 MG TAB] 15 tablet     Sig: TAKE 1/2 TABLET BY MOUTH AT BEDTIME AS NEEDED SLEEP     Not Delegated - Psychiatry:  Anxiolytics/Hypnotics Failed - 12/21/2019 11:36 AM      Failed - This refill cannot be delegated      Failed - Urine Drug Screen completed in last 360 days.      Passed - Valid encounter within last 6 months    Recent Outpatient Visits          2 weeks ago Cellulitis of right hand   [facility] [place], Dionne Bucy, MD   1 month ago Essential hypertension   [place], Dionne Bucy, MD   2 months ago Tinnitus of both [place], Dionne Bucy, MD   2 months ago Acute left-sided low back pain without sciatica   [facility], Dionne Bucy, MD   4 months ago Medicare annual wellness visit, subsequent   [facility], Dionne Bucy, MD      Future Appointments            In 1 week Bacigalupo, Dionne Bucy, MD [facility]           . hydrochlorothiazide (HYDRODIURIL) 25 MG tablet [Pharmacy Med Name: HYDROCHLOROTHIAZIDE 25 MG TAB] 30 tablet 1    Sig: TAKE 1 TABLET BY MOUTH ONCE DAILY     Cardiovascular: Diuretics - Thiazide Passed - 12/21/2019 11:36 AM      Passed - Ca in normal range and within 360 days    Calcium  Date Value Ref Range Status  01/01/2019 9.5 8.7 - 10.3 mg/dL Final         Passed - Cr in normal range and within 360 days    Creatinine  Date Value Ref Range Status  07/07/2019 0.9 0.5 - 1.1 Final   Creatinine, Ser  Date Value Ref Range Status  01/01/2019 0.73 0.57 - 1.00 mg/dL Final         Passed - K in normal range and within 360 days    Potassium  Date Value Ref Range Status  07/07/2019 3.6 3.4 - 5.3 Final         Passed - Na in normal range and within 360 days    Sodium  Date Value Ref  Range Status  07/07/2019 141 137 - 147 Final         Passed - Last BP in normal range    BP Readings from Last 1 Encounters:  12/07/19 124/70         Passed - Valid encounter within last 6 months    Recent Outpatient Visits          2 weeks ago Cellulitis of right hand   [facility] [place], Dionne Bucy, MD   1 month ago Essential hypertension   [place], Dionne Bucy, MD   2 months ago Tinnitus of both [place], Dionne Bucy, MD   2 months ago Acute left-sided low back pain without sciatica   [facility], Dionne Bucy, MD   4 months ago Medicare annual wellness visit, subsequent   [facility], Dionne Bucy, MD      Future Appointments  In 1 week Bacigalupo, Dionne Bucy, MD [facility] [place]

## 2019-12-23 DIAGNOSIS — R69 Illness, unspecified: Secondary | ICD-10-CM | POA: Diagnosis not present

## 2019-12-28 ENCOUNTER — Other Ambulatory Visit: Payer: Self-pay

## 2019-12-28 ENCOUNTER — Ambulatory Visit (INDEPENDENT_AMBULATORY_CARE_PROVIDER_SITE_OTHER): Payer: Medicare HMO | Admitting: Family Medicine

## 2019-12-28 ENCOUNTER — Encounter: Payer: Self-pay | Admitting: Family Medicine

## 2019-12-28 VITALS — BP 136/87 | HR 66 | Temp 96.9°F | Wt 117.0 lb

## 2019-12-28 DIAGNOSIS — I10 Essential (primary) hypertension: Secondary | ICD-10-CM

## 2019-12-28 NOTE — Patient Instructions (Signed)

## 2019-12-28 NOTE — Assessment & Plan Note (Signed)
Well controlled Continue current medications Recheck metabolic panel F/u in 3-6 months  

## 2019-12-28 NOTE — Progress Notes (Signed)
Patient: Alexis Lyons Female    DOB: 20-Feb-1941   79 y.o.   MRN: [id number] Visit Date: 12/28/2019  Today's Provider: Lavon Paganini, MD   Chief Complaint  Patient presents with  . Hypertension   Subjective:     HPI     Hypertension, follow-up:  BP Readings from Last 3 Encounters:  12/28/19 136/87  12/07/19 124/70  11/30/19 133/88    She was last seen for hypertension 6 weeks ago.  BP at that visit was 142/88. Management since that visit includes working on lifestyle changes. She reports good compliance with treatment. She is not having side effects.  She is exercising. She is adherent to low salt diet.   Outside blood pressures are 120-130's/70's. She is experiencing none.  Patient denies chest pain, chest pressure/discomfort, exertional chest pressure/discomfort, fatigue, lower extremity edema and palpitations.   Cardiovascular risk factors include advanced age (older than 4 for men, 15 for women) and hypertension.  Use of agents associated with hypertension: none.     Weight trend: stable Wt Readings from Last 3 Encounters:  12/28/19 117 lb (53.1 kg)  12/07/19 118 lb (53.5 kg)  11/09/19 120 lb (54.4 kg)    Current diet: Generally healthy diet. ------------------------------------------------------------------------    Allergies  Allergen Reactions  . Codeine      Current Outpatient Medications:  .  Ginseng 100 MG CAPS, every other day. , Disp: , Rfl:  .  hydrochlorothiazide (HYDRODIURIL) 25 MG tablet, TAKE 1 TABLET BY MOUTH ONCE DAILY, Disp: 30 tablet, Rfl: 1 .  zolpidem (AMBIEN) 5 MG tablet, TAKE 1/2 TABLET BY MOUTH AT BEDTIME AS NEEDED SLEEP, Disp: 15 tablet, Rfl: 2 .  lisinopril (ZESTRIL) 20 MG tablet, Take 1 tablet (20 mg total) by mouth daily. (Patient not taking: Reported on 12/07/2019), Disp: 30 tablet, Rfl: 3  Review of Systems  Constitutional: Negative.   Respiratory: Negative.   Cardiovascular: Negative.   Gastrointestinal:  Negative.   Neurological: Negative for dizziness, light-headedness and headaches.    Social History   Tobacco Use  . Smoking status: Former Smoker    Packs/day: 0.50    Years: 15.00    Pack years: 7.50    Types: Cigarettes    Quit date: 12/16/1985    Years since quitting: 34.0  . Smokeless tobacco: Never Used  Substance Use Topics  . Alcohol use: Yes    Alcohol/week: 7.0 standard drinks    Types: 7 Glasses of wine per week    Comment: glass of wine with dinner      Objective:   BP 136/87 (BP Location: Left Arm, Patient Position: Sitting, Cuff Size: Normal)   Pulse 66   Temp (!) 96.9 F (36.1 C) (Temporal)   Wt 117 lb (53.1 kg)   BMI 20.40 kg/m  Vitals:   12/28/19 1130  BP: 136/87  Pulse: 66  Temp: (!) 96.9 F (36.1 C)  TempSrc: Temporal  Weight: 117 lb (53.1 kg)  Body mass index is 20.4 kg/m.   Physical Exam Vitals reviewed.  Constitutional:      General: She is not in acute distress.    Appearance: Normal appearance. She is well-developed. She is not diaphoretic.  HENT:     Head: Normocephalic and atraumatic.  Eyes:     General: No scleral icterus.    Conjunctiva/sclera: Conjunctivae normal.  Neck:     Thyroid: No thyromegaly.  Cardiovascular:     Rate and Rhythm: Normal rate and regular rhythm.  Pulses: Normal pulses.     Heart sounds: Normal heart sounds. No murmur.  Pulmonary:     Effort: Pulmonary effort is normal. No respiratory distress.     Breath sounds: Normal breath sounds. No wheezing, rhonchi or rales.  Abdominal:     General: There is no distension.     Palpations: Abdomen is soft.     Tenderness: There is no abdominal tenderness.  Musculoskeletal:     Cervical back: Neck supple.     Right lower leg: No edema.     Left lower leg: No edema.  Lymphadenopathy:     Cervical: No cervical adenopathy.  Skin:    General: Skin is warm and dry.     Capillary Refill: Capillary refill takes less than 2 seconds.     Findings: No rash.    Neurological:     Mental Status: She is alert and oriented to person, place, and time. Mental status is at baseline.  Psychiatric:        Mood and Affect: Mood normal.        Behavior: Behavior normal.      No results found for any visits on 12/28/19.     Assessment & Plan    Problem List Items Addressed This Visit      Cardiovascular and Mediastinum   Essential hypertension - Primary    Well controlled Continue current medications Recheck metabolic panel F/u in 3-6 months       Relevant Orders   Basic Metabolic Panel (BMET)       Return in about 3 months (around 03/27/2020) for chronic disease f/u.   The entirety of the information documented in the History of Present Illness, Review of Systems and Physical Exam were personally obtained by me. Portions of this information were initially documented by Ashley Royalty , CMA and reviewed by me for thoroughness and accuracy.    Shayne Diguglielmo, Dionne Bucy, MD MPH [facility]

## 2019-12-29 ENCOUNTER — Telehealth: Payer: Self-pay

## 2019-12-29 LAB — BASIC METABOLIC PANEL
BUN/Creatinine Ratio: 15 (ref 12–28)
BUN: 13 mg/dL (ref 8–27)
CO2: 28 mmol/L (ref 20–29)
Calcium: 9.7 mg/dL (ref 8.7–10.3)
Chloride: 100 mmol/L (ref 96–106)
Creatinine, Ser: 0.84 mg/dL (ref 0.57–1.00)
GFR calc Af Amer: 78 mL/min/{1.73_m2} (ref >59)
GFR calc non Af Amer: 67 mL/min/{1.73_m2} (ref >59)
Glucose: 87 mg/dL (ref 65–99)
Potassium: 3.4 mmol/L — ABNORMAL LOW (ref 3.5–5.2)
Sodium: 141 mmol/L (ref 134–144)

## 2019-12-29 NOTE — Telephone Encounter (Signed)
-----   Message from Virginia Crews, MD sent at 12/29/2019  8:42 AM EST ----- Normal labs, except potassium is low.  This could be caused by medication, but it also could be dietary changes.  Recommend adding potassium rich foods to her diet.  Recommend repeating BMP in 2 to 4 weeks to recheck.

## 2019-12-29 NOTE — Telephone Encounter (Signed)
Patient advised as below.  

## 2020-01-06 ENCOUNTER — Ambulatory Visit: Payer: Medicare Other | Attending: Family Medicine

## 2020-01-06 DIAGNOSIS — Z23 Encounter for immunization: Secondary | ICD-10-CM

## 2020-01-06 NOTE — Progress Notes (Signed)
   Covid-19 Vaccination Clinic  Name:  Alexis Lyons    MRN: [id number] DOB: 10-Feb-1941  01/06/2020  Alexis Lyons was observed post Covid-19 immunization for 15 minutes without incidence. She was provided with Vaccine Information Sheet and instruction to access the V-Safe system.   Alexis Lyons was instructed to call 911 with any severe reactions post vaccine: Marland Kitchen Difficulty breathing  . Swelling of your face and throat  . A fast heartbeat  . A bad rash all over your body  . Dizziness and weakness    Immunizations Administered    Name Date Dose VIS Date Route   Pfizer COVID-19 Vaccine 01/06/2020 11:39 AM 0.3 mL 11/27/2019 Intramuscular   Manufacturer: [place]   Lot: [id number]   [place]: [id number]

## 2020-01-13 DIAGNOSIS — R69 Illness, unspecified: Secondary | ICD-10-CM | POA: Diagnosis not present

## 2020-01-18 ENCOUNTER — Other Ambulatory Visit: Payer: Self-pay | Admitting: Family Medicine

## 2020-01-18 DIAGNOSIS — F5102 Adjustment insomnia: Secondary | ICD-10-CM

## 2020-01-18 MED ORDER — HYDROCHLOROTHIAZIDE 25 MG PO TABS: 25.0000 mg | ORAL_TABLET | Freq: Every day | ORAL | 1 refills | Status: DC

## 2020-01-18 MED ORDER — ZOLPIDEM TARTRATE 5 MG PO TABS: ORAL_TABLET | ORAL | 2 refills | Status: DC

## 2020-01-18 NOTE — Telephone Encounter (Signed)
Patient has upcoming appointment- refilled per protocol. PCP aware of lab result.

## 2020-01-18 NOTE — Telephone Encounter (Signed)
Copied from [place] [id number]. Topic: Quick Communication - Rx Refill/Question >> Jan 18, 2020 12:13 PM Izola Price, [place] A wrote: Medication: hydrochlorothiazide (HYDRODIURIL) 25 MG tablet ,zolpidem (AMBIEN) 5 MG tablet (Patient has only 1 pill left,)  Has the patient contacted their pharmacy? {Yes (Agent: If no, request that the patient contact the pharmacy for the refill.) (Agent: If yes, when and what did the pharmacy advise?)Contact PCP  Preferred Pharmacy (with phone number or street name): [place], [place].  Phone:  [phone] Fax:  [phone]     Agent: Please be advised that RX refills may take up to 3 business days. We ask that you follow-up with your pharmacy.

## 2020-01-18 NOTE — Telephone Encounter (Signed)
Requested medication (s) are due for refill today - yes  Requested medication (s) are on the active medication list -yes  Future visit scheduled -yes  Last refill: 4 weeks ago  Notes to clinic: Pharmacy is requesting Sig for refill of non delegated Rx  Requested Prescriptions  Pending Prescriptions Disp Refills   zolpidem (AMBIEN) 5 MG tablet 15 tablet 2      Not Delegated - Psychiatry:  Anxiolytics/Hypnotics Failed - 01/18/2020 12:18 PM      Failed - This refill cannot be delegated      Failed - Urine Drug Screen completed in last 360 days.      Passed - Valid encounter within last 6 months    Recent Outpatient Visits           3 weeks ago Essential hypertension   [place], Dionne Bucy, MD   1 month ago Cellulitis of right hand   [facility] [place], Dionne Bucy, MD   2 months ago Essential hypertension   [place], Dionne Bucy, MD   2 months ago Tinnitus of both [place], Dionne Bucy, MD   3 months ago Acute left-sided low back pain without sciatica   [facility], Dionne Bucy, MD       Future Appointments             In 2 months Bacigalupo, Dionne Bucy, MD [facility]             Signed Prescriptions Disp Refills   hydrochlorothiazide (HYDRODIURIL) 25 MG tablet 30 tablet 1    Sig: Take 1 tablet (25 mg total) by mouth daily.      Cardiovascular: Diuretics - Thiazide Failed - 01/18/2020 12:18 PM      Failed - K in normal range and within 360 days    Potassium  Date Value Ref Range Status  12/28/2019 3.4 (L) 3.5 - 5.2 mmol/L Final          Passed - Ca in normal range and within 360 days    Calcium  Date Value Ref Range Status  12/28/2019 9.7 8.7 - 10.3 mg/dL Final          Passed - Cr in normal range and within 360 days    Creatinine, Ser  Date Value Ref Range Status  12/28/2019 0.84 0.57 - 1.00 mg/dL Final          Passed - Na in normal range and within 360 days    Sodium  Date Value Ref Range Status  12/28/2019 141 134 - 144 mmol/L Final          Passed - Last BP in normal range    BP Readings from Last 1 Encounters:  12/28/19 136/87          Passed - Valid encounter within last 6 months    Recent Outpatient Visits           3 weeks ago Essential hypertension   [place], Dionne Bucy, MD   1 month ago Cellulitis of right hand   [place], Dionne Bucy, MD   2 months ago Essential hypertension   [place], Dionne Bucy, MD   2 months ago Tinnitus of both [place], Dionne Bucy, MD   3 months ago Acute left-sided low back pain without sciatica   [facility] Virginia Crews,  MD       Future Appointments             In 2 months Bacigalupo, Dionne Bucy, MD [facility], [facility]                Requested Prescriptions  Pending Prescriptions Disp Refills   zolpidem (AMBIEN) 5 MG tablet 15 tablet 2      Not Delegated - Psychiatry:  Anxiolytics/Hypnotics Failed - 01/18/2020 12:18 PM      Failed - This refill cannot be delegated      Failed - Urine Drug Screen completed in last 360 days.      Passed - Valid encounter within last 6 months    Recent Outpatient Visits           3 weeks ago Essential hypertension   [place], Dionne Bucy, MD   1 month ago Cellulitis of right hand   [facility], Dionne Bucy, MD   2 months ago Essential hypertension   [place], Dionne Bucy, MD   2 months ago Tinnitus of both [place], Dionne Bucy, MD   3 months ago Acute left-sided low back pain without sciatica   [facility], Dionne Bucy, MD       Future Appointments             In 2 months Bacigalupo, Dionne Bucy, MD [facility]             Signed Prescriptions Disp Refills   hydrochlorothiazide (HYDRODIURIL) 25 MG tablet 30 tablet 1    Sig: Take 1 tablet (25 mg total) by mouth daily.      Cardiovascular: Diuretics - Thiazide Failed - 01/18/2020 12:18 PM      Failed - K in normal range and within 360 days    Potassium  Date Value Ref Range Status  12/28/2019 3.4 (L) 3.5 - 5.2 mmol/L Final          Passed - Ca in normal range and within 360 days    Calcium  Date Value Ref Range Status  12/28/2019 9.7 8.7 - 10.3 mg/dL Final          Passed - Cr in normal range and within 360 days    Creatinine, Ser  Date Value Ref Range Status  12/28/2019 0.84 0.57 - 1.00 mg/dL Final          Passed - Na in normal range and within 360 days    Sodium  Date Value Ref Range Status  12/28/2019 141 134 - 144 mmol/L Final          Passed - Last BP in normal range    BP Readings from Last 1 Encounters:  12/28/19 136/87          Passed - Valid encounter within last 6 months    Recent Outpatient Visits           3 weeks ago Essential hypertension   [place], Dionne Bucy, MD   1 month ago Cellulitis of right hand   [place], Dionne Bucy, MD   2 months ago Essential hypertension   [place], Dionne Bucy, MD   2 months ago Tinnitus of both [place], Dionne Bucy, MD   3 months ago Acute left-sided low back pain without sciatica   [facility] [place], Levada Dy  M, MD       Future Appointments             In 2 months Bacigalupo, Dionne Bucy, MD [facility], [place]

## 2020-01-25 ENCOUNTER — Ambulatory Visit: Payer: Medicare HMO | Attending: Internal Medicine

## 2020-01-25 DIAGNOSIS — Z23 Encounter for immunization: Secondary | ICD-10-CM | POA: Insufficient documentation

## 2020-01-25 NOTE — Progress Notes (Signed)
   Covid-19 Vaccination Clinic  Name:  Alexis Lyons    MRN: [id number] DOB: 06-Feb-1941  01/25/2020  [name] was observed post Covid-19 immunization for 15 minutes without incidence. She was provided with Vaccine Information Sheet and instruction to access the V-Safe system.   [name] was instructed to call 911 with any severe reactions post vaccine: Marland Kitchen Difficulty breathing  . Swelling of your face and throat  . A fast heartbeat  . A bad rash all over your body  . Dizziness and weakness    Immunizations Administered    Name Date Dose VIS Date Route   Pfizer COVID-19 Vaccine 01/25/2020 10:59 AM 0.3 mL 11/27/2019 Intramuscular   Manufacturer: [place]   Lot: [id number]   [place]: [id number]

## 2020-01-29 ENCOUNTER — Ambulatory Visit: Payer: Self-pay | Admitting: Family Medicine

## 2020-03-15 ENCOUNTER — Other Ambulatory Visit: Payer: Self-pay

## 2020-03-15 ENCOUNTER — Ambulatory Visit (INDEPENDENT_AMBULATORY_CARE_PROVIDER_SITE_OTHER): Payer: Medicare HMO | Admitting: Family Medicine

## 2020-03-15 ENCOUNTER — Encounter: Payer: Self-pay | Admitting: Family Medicine

## 2020-03-15 VITALS — BP 130/83 | HR 63 | Temp 96.8°F | Wt 117.0 lb

## 2020-03-15 DIAGNOSIS — E876 Hypokalemia: Secondary | ICD-10-CM | POA: Diagnosis not present

## 2020-03-15 DIAGNOSIS — I10 Essential (primary) hypertension: Secondary | ICD-10-CM

## 2020-03-15 DIAGNOSIS — K130 Diseases of lips: Secondary | ICD-10-CM | POA: Diagnosis not present

## 2020-03-15 NOTE — Patient Instructions (Signed)
Potassium Content of Foods  Potassium is a mineral found in many foods and drinks. It affects how the heart works, and helps keep fluids and minerals balanced in the body. The amount of potassium you need each day depends on your age and any medical conditions you may have. Talk to your health care provider or dietitian about how much potassium you need. The following lists of foods provide the general serving size for foods and the approximate amount of potassium in each serving, listed in milligrams (mg). Actual values may vary depending on the product and how it is processed. High in potassium The following foods and beverages have 200 mg or more of potassium per serving:  Apricots (raw) - 2 have 200 mg of potassium.  Apricots (dry) - 5 have 200 mg of potassium.  Artichoke - 1 medium has 345 mg of potassium.  Avocado -  fruit has 245 mg of potassium.  Banana - 1 medium fruit has 425 mg of potassium.  Lima or baked beans (canned) -  cup has 280 mg of potassium.  White beans (canned) -  cup has 595 mg potassium.  Beef roast - 3 oz has 320 mg of potassium.  Ground beef - 3 oz has 270 mg of potassium.  Beets (raw or cooked) -  cup has 260 mg of potassium.  Bran muffin - 2 oz has 300 mg of potassium.  Broccoli (cooked) -  cup has 230 mg of potassium.  Brussels sprouts -  cup has 250 mg of potassium.  Cantaloupe -  cup has 215 mg of potassium.  Cereal, 100% bran -  cup has 200-400 mg of potassium.  Cheeseburger -1 single fast food burger has 225-400 mg of potassium.  Chicken - 3 oz has 220 mg of potassium.  Clams (canned) - 3 oz has 535 mg of potassium.  Crab - 3 oz has 225 mg of potassium.  Dates - 5 have 270 mg of potassium.  Dried beans and peas -  cup has 300-475 mg of potassium.  Figs (dried) - 2 have 260 mg of potassium.  Fish (halibut, tuna, cod, snapper) - 3 oz has 480 mg of potassium.  Fish (salmon, haddock, swordfish, perch) - 3 oz has 300 mg of  potassium.  Fish (tuna, canned) - 3 oz has 200 mg of potassium.  French fries (fast food) - 3 oz has 470 mg of potassium.  Granola with fruit and nuts -  cup has 200 mg of potassium.  Grapefruit juice -  cup has 200 mg of potassium.  Honeydew melon -  cup has 200 mg of potassium.  Kale (raw) - 1 cup has 300 mg of potassium.  Kiwi - 1 medium fruit has 240 mg of potassium.  Kohlrabi, rutabaga, parsnips -  cup has 280 mg of potassium.  Lentils -  cup has 365 mg of potassium.  Mango - 1 each has 325 mg of potassium.  Milk (nonfat, low-fat, whole, buttermilk) - 1 cup has 350-380 mg of potassium.  Milk (chocolate) - 1 cup has 420 mg of potassium  Molasses - 1 Tbsp has 295 mg of potassium.  Mushrooms -  cup has 280 mg of potassium.  Nectarine - 1 each has 275 mg of potassium.  Nuts (almonds, peanuts, hazelnuts, [place], cashew, mixed) - 1 oz has 200 mg of potassium.  Nuts (pistachios) - 1 oz has 295 mg of potassium.  Orange - 1 fruit has 240 mg of potassium.  Orange juice -    cup has 235 mg of potassium.  Papaya -  medium fruit has 390 mg of potassium.  Peanut butter (chunky) - 2 Tbsp has 240 mg of potassium.  Peanut butter (smooth) - 2 Tbsp has 210 mg of potassium.  Pear - 1 medium (200 mg) of potassium.  Pomegranate - 1 whole fruit has 400 mg of potassium.  Pomegranate juice -  cup has 215 mg of potassium.  Pork - 3 oz has 350 mg of potassium.  Potato chips (salted) - 1 oz has 465 mg of potassium.  Potato (baked with skin) - 1 medium has 925 mg of potassium.  Potato (boiled) -  cup has 255 mg of potassium.  Potato (Mashed) -  cup has 330 mg of potassium.  Prune juice -  cup has 370 mg of potassium.  Prunes - 5 have 305 mg of potassium.  Pudding (chocolate) -  cup has 230 mg of potassium.  Pumpkin (canned) -  cup has 250 mg of potassium.  Raisins (seedless) -  cup has 270 mg of potassium.  Seeds (sunflower or pumpkin) - 1 oz has 240 mg of  potassium.  Soy milk - 1 cup has 300 mg of potassium.  Spinach (cooked) - 1/2 cup has 420 mg of potassium.  Spinach (canned) -  cup has 370 mg of potassium.  Sweet potato (baked with skin) - 1 medium has 450 mg of potassium.  Swiss chard -  cup has 480 mg of potassium.  Tomato or vegetable juice -  cup has 275 mg of potassium.  Tomato (sauce or puree) -  cup has 400-550 mg of potassium.  Tomato (raw) - 1 medium has 290 mg of potassium.  Tomato (canned) -  cup has 200-300 mg of potassium.  Turkey - 3 oz has 250 mg of potassium.  Wheat germ - 1 oz has 250 mg of potassium.  Winter squash -  cup has 250 mg of potassium.  Yogurt (plain or fruited) - 6 oz has 260-435 mg of potassium.  Zucchini -  cup has 220 mg of potassium. Moderate in potassium The following foods and beverages have 50-200 mg of potassium per serving:  Apple - 1 fruit has 150 mg of potassium  Apple juice -  cup has 150 mg of potassium  Applesauce -  cup has 90 mg of potassium  Apricot nectar -  cup has 140 mg of potassium  Asparagus (small spears) -  cup has 155 mg of potassium  Asparagus (large spears) - 6 have 155 mg of potassium  Bagel (cinnamon raisin) - 1 four-inch bagel has 130 mg of potassium  Bagel (egg or plain) - 1 four- inch bagel has 70 mg of potassium  Beans (green) -  cup has 90 mg of potassium  Beans (yellow) -  cup has 190 mg of potassium  Beer, regular - 12 oz has 100 mg of potassium  Beets (canned) -  cup has 125 mg of potassium  Blackberries -  cup has 115 mg of potassium  Blueberries -  cup has 60 mg of potassium  Bread (whole wheat) - 1 slice has 70 mg of potassium  Broccoli (raw) -  cup has 145 mg of potassium  Cabbage -  cup has 150 mg of potassium  Carrots (cooked or raw) -  cup has 180 mg of potassium  Cauliflower (raw) -  cup has 150 mg of potassium  Celery (raw) -  cup has 155 mg of potassium  Cereal, bran   flakes -  cup has 120-150 mg  of potassium  Cheese (cottage) -  cup has 110 mg of potassium  Cherries - 10 have 150 mg of potassium  Chocolate - 1 oz bar has 165 mg of potassium  Coffee (brewed) - 6 oz has 90 mg of potassium  Corn -  cup or 1 ear has 195 mg of potassium  Cucumbers -  cup has 80 mg of potassium  Egg - 1 large egg has 60 mg of potassium  Eggplant -  cup has 60 mg of potassium  Endive (raw) -  cup has 80 mg of potassium  English muffin - 1 has 65 mg of potassium  Fish (ocean perch) - 3 oz has 192 mg of potassium  Frankfurter, beef or pork - 1 has 75 mg of potassium  Fruit cocktail -  cup has 115 mg of potassium  Grape juice -  cup has 170 mg of potassium  Grapefruit -  fruit has 175 mg of potassium  Grapes -  cup has 155 mg of potassium  Greens: kale, turnip, collard -  cup has 110-150 mg of potassium  Ice cream or frozen yogurt (chocolate) -  cup has 175 mg of potassium  Ice cream or frozen yogurt (vanilla) -  cup has 120-150 mg of potassium  Lemons, limes - 1 each has 80 mg of potassium  Lettuce - 1 cup has 100 mg of potassium  Mixed vegetables -  cup has 150 mg of potassium  Mushrooms, raw -  cup has 110 mg of potassium  Nuts (walnuts, pecans, or macadamia) - 1 oz has 125 mg of potassium  Oatmeal -  cup has 80 mg of potassium  Okra -  cup has 110 mg of potassium  Onions -  cup has 120 mg of potassium  Peach - 1 has 185 mg of potassium  Peaches (canned) -  cup has 120 mg of potassium  Pears (canned) -  cup has 120 mg of potassium  Peas, green (frozen) -  cup has 90 mg of potassium  Peppers (Green) -  cup has 130 mg of potassium  Peppers (Red) -  cup has 160 mg of potassium  Pineapple juice -  cup has 165 mg of potassium  Pineapple (fresh or canned) -  cup has 100 mg of potassium  Plums - 1 has 105 mg of potassium  Pudding, vanilla -  cup has 150 mg of potassium  Raspberries -  cup has 90 mg of potassium  Rhubarb -  cup has  115 mg of potassium  Rice, wild -  cup has 80 mg of potassium  Shrimp - 3 oz has 155 mg of potassium  Spinach (raw) - 1 cup has 170 mg of potassium  Strawberries -  cup has 125 mg of potassium  Summer squash -  cup has 175-200 mg of potassium  Swiss chard (raw) - 1 cup has 135 mg of potassium  Tangerines - 1 fruit has 140 mg of potassium  Tea, brewed - 6 oz has 65 mg of potassium  Turnips -  cup has 140 mg of potassium  Watermelon -  cup has 85 mg of potassium  Wine (Red, table) - 5 oz has 180 mg of potassium  Wine (White, table) - 5 oz 100 mg of potassium Low in potassium The following foods and beverages have less than 50 mg of potassium per serving.  Bread (white) - 1 slice has 30 mg of potassium    Carbonated beverages - 12 oz has less than 5 mg of potassium  Cheese - 1 oz has 20-30 mg of potassium  Cranberries -  cup has 45 mg of potassium  Cranberry juice cocktail -  cup has 20 mg of potassium  Fats and oils - 1 Tbsp has less than 5 mg of potassium  Hummus - 1 Tbsp has 32 mg of potassium  Nectar (papaya, mango, or pear) -  cup has 35 mg of potassium  Rice (white or brown) -  cup has 50 mg of potassium  Spaghetti or macaroni (cooked) -  cup has 30 mg of potassium  Tortilla, flour or corn - 1 has 50 mg of potassium  Waffle - 1 four-inch waffle has 50 mg of potassium  Water chestnuts -  cup has 40 mg of potassium Summary  Potassium is a mineral found in many foods and drinks. It affects how the heart works, and helps keep fluids and minerals balanced in the body.  The amount of potassium you need each day depends on your age and any existing medical conditions you may have. Your health care provider or dietitian may recommend an amount of potassium that you should have each day. This information is not intended to replace advice given to you by your health care provider. Make sure you discuss any questions you have with your health care  provider. Document Revised: 11/15/2017 Document Reviewed: 02/27/2017 Elsevier Patient Education  2020 Elsevier Inc.   

## 2020-03-15 NOTE — Progress Notes (Signed)
Patient: Alexis Lyons Female    DOB: 12/21/1940   79 y.o.   MRN: [id number] Visit Date: 03/16/2020  Today's Provider: Lavon Paganini, MD   Chief Complaint  Patient presents with  . Puncture Wound    Lip filler about 18 months ago, and the infection sites are still not healing   . Hypertension   Subjective:     HPI   Lip filler about 18 months ago, and the infection sites are still not healing.  Pt was giving samples of [place] Cicalfate over a month ago and is not helping.   Done by Aesthetics by Dyann Ruddle in [facility]   Hypertension, follow-up:  BP Readings from Last 3 Encounters:  03/15/20 130/83  12/28/19 136/87  12/07/19 124/70    She was last seen for hypertension 3 months ago.  BP at that visit was 136/87. Management since that visit includes no changes. She reports excellent compliance with treatment. She is not having side effects.  She is exercising. She is adherent to low salt diet.   Outside blood pressures are running normal. She is experiencing chest pain, fatigue, irregular heart beat and palpitations.  Patient denies chest pain, dyspnea, fatigue and irregular heart beat.   Cardiovascular risk factors include advanced age (older than 66 for men, 74 for women) and hypertension.  Use of agents associated with hypertension: none.     Weight trend: stable Wt Readings from Last 3 Encounters:  03/15/20 117 lb (53.1 kg)  12/28/19 117 lb (53.1 kg)  12/07/19 118 lb (53.5 kg)    Current diet: in general, a "healthy" diet    ------------------------------------------------------------------------    Allergies  Allergen Reactions  . Codeine      Current Outpatient Medications:  .  Ginseng 100 MG CAPS, every other day. , Disp: , Rfl:  .  hydrochlorothiazide (HYDRODIURIL) 25 MG tablet, Take 1 tablet (25 mg total) by mouth daily., Disp: 30 tablet, Rfl: 1 .  zolpidem (AMBIEN) 5 MG tablet, TAKE 1/2 TABLET BY MOUTH AT BEDTIME AS NEEDED SLEEP, Disp:  15 tablet, Rfl: 2  Review of Systems  Constitutional: Negative.   Respiratory: Negative.   Cardiovascular: Negative.   Gastrointestinal: Negative.   Skin: Positive for wound. Negative for color change, pallor and rash.  Neurological: Negative for dizziness, light-headedness and headaches.    Social History   Tobacco Use  . Smoking status: Former Smoker    Packs/day: 0.50    Years: 15.00    Pack years: 7.50    Types: Cigarettes    Quit date: 12/16/1985    Years since quitting: 34.2  . Smokeless tobacco: Never Used  Substance Use Topics  . Alcohol use: Yes    Alcohol/week: 7.0 standard drinks    Types: 7 Glasses of wine per week    Comment: glass of wine with dinner      Objective:   BP 130/83 (BP Location: Left Arm, Patient Position: Sitting, Cuff Size: Normal)   Pulse 63   Temp (!) 96.8 F (36 C) (Temporal)   Wt 117 lb (53.1 kg)   BMI 20.40 kg/m  Vitals:   03/15/20 1045  BP: 130/83  Pulse: 63  Temp: (!) 96.8 F (36 C)  TempSrc: Temporal  Weight: 117 lb (53.1 kg)  Body mass index is 20.4 kg/m.   Physical Exam Vitals reviewed.  Constitutional:      General: She is not in acute distress.    Appearance: Normal appearance. She is not  diaphoretic.  HENT:     Head: Normocephalic and atraumatic.     Mouth/Throat:     Mouth: Mucous membranes are moist.     Pharynx: Oropharynx is clear.  Cardiovascular:     Rate and Rhythm: Normal rate and regular rhythm.     Heart sounds: Normal heart sounds. No murmur.  Pulmonary:     Effort: Pulmonary effort is normal. No respiratory distress.     Breath sounds: Normal breath sounds. No wheezing.  Musculoskeletal:     Right lower leg: No edema.     Left lower leg: No edema.  Skin:    General: Skin is warm and dry.     Comments: Puncture sites are noted both sides of upper lip with no surrounding erythema  Neurological:     Mental Status: She is alert and oriented to person, place, and time. Mental status is at  baseline.  Psychiatric:        Mood and Affect: Mood normal.        Behavior: Behavior normal.      Results for orders placed or performed in visit on 99991111  Basic Metabolic Panel (BMET)  Result Value Ref Range   Glucose 85 65 - 99 mg/dL   BUN 17 8 - 27 mg/dL   Creatinine, Ser 0.78 0.57 - 1.00 mg/dL   GFR calc non Af Amer 74 >59 mL/min/1.73   GFR calc Af Amer 85 >59 mL/min/1.73   BUN/Creatinine Ratio 22 12 - 28   Sodium 140 134 - 144 mmol/L   Potassium 4.0 3.5 - 5.2 mmol/L   Chloride 101 96 - 106 mmol/L   CO2 28 20 - 29 mmol/L   Calcium 9.8 8.7 - 10.3 mg/dL       Assessment & Plan    Problem List Items Addressed This Visit      Cardiovascular and Mediastinum   Essential hypertension    Well controlled Continue current medications Recheck metabolic panel F/u in 3-6 months Mild hypokalemia on last labs, which we will recheck today Advised on potassium rich foods      Relevant Orders   Basic Metabolic Panel (BMET) (Completed)    Other Visit Diagnoses    Lip lesion    -  Primary   Hypokalemia       Relevant Orders   Basic Metabolic Panel (BMET) (Completed)    -Patient has nonhealing puncture marks where she had lip filler injected over a year ago -Advised her to follow-up with dermatology -Reassured her that there is no infection or urgent need to be seen by dermatology and this can wait for her appointment in 2 weeks -Can continue to use scar cream in the meantime   Return in about 5 months (around 08/15/2020) for CPE/AWV.   The entirety of the information documented in the History of Present Illness, Review of Systems and Physical Exam were personally obtained by me. Portions of this information were initially documented by Ashley Royalty, CMA and reviewed by me for thoroughness and accuracy.    Cortny Bambach, Dionne Bucy, MD MPH [facility]

## 2020-03-16 ENCOUNTER — Telehealth: Payer: Self-pay

## 2020-03-16 LAB — BASIC METABOLIC PANEL
BUN/Creatinine Ratio: 22 (ref 12–28)
BUN: 17 mg/dL (ref 8–27)
CO2: 28 mmol/L (ref 20–29)
Calcium: 9.8 mg/dL (ref 8.7–10.3)
Chloride: 101 mmol/L (ref 96–106)
Creatinine, Ser: 0.78 mg/dL (ref 0.57–1.00)
GFR calc Af Amer: 85 mL/min/{1.73_m2} (ref >59)
GFR calc non Af Amer: 74 mL/min/{1.73_m2} (ref >59)
Glucose: 85 mg/dL (ref 65–99)
Potassium: 4 mmol/L (ref 3.5–5.2)
Sodium: 140 mmol/L (ref 134–144)

## 2020-03-16 NOTE — Telephone Encounter (Signed)
Patient advised as below.  

## 2020-03-16 NOTE — Telephone Encounter (Signed)
-----   Message from Virginia Crews, MD sent at 03/16/2020 10:06 AM EDT ----- Normal labs

## 2020-03-16 NOTE — Assessment & Plan Note (Addendum)
Well controlled Continue current medications Recheck metabolic panel F/u in 3-6 months Mild hypokalemia on last labs, which we will recheck today Advised on potassium rich foods

## 2020-03-21 DIAGNOSIS — D2271 Melanocytic nevi of right lower limb, including hip: Secondary | ICD-10-CM | POA: Diagnosis not present

## 2020-03-21 DIAGNOSIS — D2262 Melanocytic nevi of left upper limb, including shoulder: Secondary | ICD-10-CM | POA: Diagnosis not present

## 2020-03-21 DIAGNOSIS — L570 Actinic keratosis: Secondary | ICD-10-CM | POA: Diagnosis not present

## 2020-03-21 DIAGNOSIS — Z85828 Personal history of other malignant neoplasm of skin: Secondary | ICD-10-CM | POA: Diagnosis not present

## 2020-03-21 DIAGNOSIS — X32XXXA Exposure to sunlight, initial encounter: Secondary | ICD-10-CM | POA: Diagnosis not present

## 2020-03-21 DIAGNOSIS — D2261 Melanocytic nevi of right upper limb, including shoulder: Secondary | ICD-10-CM | POA: Diagnosis not present

## 2020-03-21 DIAGNOSIS — R202 Paresthesia of skin: Secondary | ICD-10-CM | POA: Diagnosis not present

## 2020-03-21 DIAGNOSIS — Z872 Personal history of diseases of the skin and subcutaneous tissue: Secondary | ICD-10-CM | POA: Diagnosis not present

## 2020-03-28 ENCOUNTER — Ambulatory Visit: Payer: Medicare HMO | Admitting: Family Medicine

## 2020-03-29 ENCOUNTER — Encounter: Payer: Self-pay | Admitting: Emergency Medicine

## 2020-03-29 ENCOUNTER — Ambulatory Visit: Payer: Self-pay | Admitting: *Deleted

## 2020-03-29 ENCOUNTER — Other Ambulatory Visit: Payer: Self-pay

## 2020-03-29 ENCOUNTER — Ambulatory Visit
Admission: EM | Admit: 2020-03-29 | Discharge: 2020-03-29 | Disposition: A | Discharge status: Home / Self Care | Payer: Medicare HMO | Attending: Emergency Medicine | Admitting: Emergency Medicine

## 2020-03-29 DIAGNOSIS — R42 Dizziness and giddiness: Secondary | ICD-10-CM

## 2020-03-29 DIAGNOSIS — R002 Palpitations: Secondary | ICD-10-CM | POA: Diagnosis not present

## 2020-03-29 LAB — POCT URINALYSIS DIP (MANUAL ENTRY)
Bilirubin, UA: NEGATIVE
Blood, UA: NEGATIVE
Glucose, UA: NEGATIVE mg/dL
Ketones, POC UA: NEGATIVE mg/dL
Leukocytes, UA: NEGATIVE
Nitrite, UA: NEGATIVE
Protein Ur, POC: NEGATIVE mg/dL
Spec Grav, UA: 1.01 (ref 1.010–1.025)
Urobilinogen, UA: 0.2 E.U./dL
pH, UA: 7 (ref 5.0–8.0)

## 2020-03-29 NOTE — Telephone Encounter (Signed)
Pt called with complaints of her heart racing and dizziness that started 03/29/20 at 0300; she took deep breaths and 1/4 of a 5 mg ambien; the pt states that she slept but when she awakened at 0800 she felt the same; her HR is normally in the 60's, but it was 84 at 0300, and 71 at 1000; BP 123/97 at 0300 left arm), and 106/70 at 1000 Left arm, pulse 100, and 112/74 right arm, pulse 71 at 1000; she states that she is only lightheaded she stands, but she feels like she is going to fall; the pt takes HCTZ 25 mg last dose 03/28/20; she says her BP normally runs 120-130s/70s; recommendations made per nurse triage protocol; she verbalized understanding; the pt sees Dr Brita Romp, [facility]; will route to office for notification.  Reason for Disposition . SEVERE dizziness (e.g., unable to stand, requires support to walk, feels like passing out now)  Answer Assessment - Initial Assessment Questions 1. DESCRIPTION: "Describe your dizziness."     lightheaded 2. LIGHTHEADED: "Do you feel lightheaded?" (e.g., somewhat faint, woozy, weak upon standing)    Feels like she may fall  When she stands up 3. VERTIGO: "Do you feel like either you or the room is spinning or tilting?" (i.e. vertigo)     no 4. SEVERITY: "How bad is it?"  "Do you feel like you are going to faint?" "Can you stand and walk?"   - MILD - walking normally   - MODERATE - interferes with normal activities (e.g., work, school)    - SEVERE - unable to stand, requires support to walk, feels like passing out now.      Moderate to severe 5. ONSET:  "When did the dizziness begin?"    03/29/20 at 0300 6. AGGRAVATING FACTORS: "Does anything make it worse?" (e.g., standing, change in head position)    Standing 7. HEART RATE: "Can you tell me your heart rate?" "How many beats in 15 seconds?"  (Note: not all patients can do this)       100 bpm 8. CAUSE: "What do you think is causing the dizziness?"     ? dehydration 9. RECURRENT SYMPTOM: "Have  you had dizziness before?" If so, ask: "When was the last time?" "What happened that time?"    2 years ago passed out 10. OTHER SYMPTOMS: "Do you have any other symptoms?" (e.g., fever, chest pain, vomiting, diarrhea, bleeding)     no 11. PREGNANCY: "Is there any chance you are pregnant?" "When was your last menstrual period?"     no  Protocols used: DIZZINESS [facility]

## 2020-03-29 NOTE — ED Provider Notes (Addendum)
[name]    CSN: [id number] Arrival date & time: 03/29/20  1050      History   Chief Complaint Chief Complaint  Patient presents with  . Palpitation    HPI Alexis Lyons is a 79 y.o. female.  Patient presents with dizziness and feeling of her heart racing since 3 AM today.  She took her pulse and it was in the 80s but she states it is usually in the 60s.  She drank some water because she felt she may be dehydrated and felt better; her symptoms have resolved.  She denies focal weakness, facial asymmetry, numbness, speech difficulty, chest pain, shortness of breath, nausea, diaphoresis, or other symptoms at that time or currently.    The history is provided by the patient.    Past Medical History:  Diagnosis Date  . Amnesia   . Aphthae   . Congestion-fibrosis syndrome   . DD (diverticular disease)   . GERD (gastroesophageal reflux disease)   . Seizures ([place]) 12/2017   passed out due to dehydration    Patient Active Problem List   Diagnosis Date Noted  . Chronic headache 01/01/2019  . Essential hypertension 07/09/2018  . Osteopenia 11/06/2017  . Seborrheic dermatitis 11/05/2017  . Aphthae 05/28/2015  . DD (diverticular disease) 05/28/2015  . Fatigue 05/28/2015  . Hernia, inguinal, right 05/28/2015  . Amnesia 05/28/2015  . 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 Atrophy of vagina 05/28/2015  . Vaginal lesion 09/21/2008  . Acid reflux 06/15/2008  . Insomnia 12/17/2004    Past Surgical History:  Procedure Laterality Date  . CHOLECYSTECTOMY    . colonoscopy with polypectomy     8/11 4 mm polyp, 12 mm polyp  . COLONOSCOPY WITH PROPOFOL N/A 02/24/2018   Procedure: COLONOSCOPY WITH PROPOFOL;  Surgeon: Manya Silvas, MD;  Location: [facility] ENDOSCOPY;  Service: Endoscopy;  Laterality: N/A;  . HERNIA REPAIR Right 2004   inguinal   . HERNIA REPAIR     x's 3    OB History    Gravida  2   Para  2   Term      Preterm      AB      Living       SAB      TAB      Ectopic      Multiple      Live Births               Home Medications    Prior to Admission medications   Medication Sig Start Date End Date Taking? Authorizing Provider  Ginseng 100 MG CAPS every other day.  06/08/09   [provider]  hydrochlorothiazide (HYDRODIURIL) 25 MG tablet Take 1 tablet (25 mg total) by mouth daily. 01/18/20   Virginia Crews, MD  zolpidem (AMBIEN) 5 MG tablet TAKE 1/2 TABLET BY MOUTH AT BEDTIME AS NEEDED SLEEP 01/18/20   [place], Dionne Bucy, MD    Family History Family History  Problem Relation Age of Onset  . Alzheimer's disease Mother   . Alcohol abuse Father   . Cancer Father        liver cancer  . Cancer Sister        breast  . Breast cancer Sister     Social History Social History   Tobacco Use  . Smoking status: Former Smoker    Packs/day: 0.50    Years: 15.00    Pack years: 7.50    Types: Cigarettes  Quit date: 12/16/1985    Years since quitting: 34.3  . Smokeless tobacco: Never Used  Substance Use Topics  . Alcohol use: Yes    Alcohol/week: 7.0 standard drinks    Types: 7 Glasses of wine per week    Comment: glass of wine with dinner  . Drug use: No     Allergies   Codeine   Review of Systems Review of Systems  Constitutional: Negative for chills and fever.  HENT: Negative for ear pain and sore throat.   Eyes: Negative for pain and visual disturbance.  Respiratory: Negative for cough and shortness of breath.   Cardiovascular: Positive for palpitations. Negative for chest pain.  Gastrointestinal: Negative for abdominal pain, nausea and vomiting.  Genitourinary: Negative for dysuria and hematuria.  Musculoskeletal: Negative for arthralgias and back pain.  Skin: Negative for color change and rash.  Neurological: Positive for dizziness. Negative for seizures, syncope, facial asymmetry, speech difficulty, weakness and numbness.  All other systems reviewed and are  negative.    Physical Exam Triage Vital Signs ED Triage Vitals  Enc Vitals Group     BP      Pulse      Resp      Temp      Temp src      SpO2      Weight      Height      Head Circumference      Peak Flow      Pain Score      Pain Loc      Pain Edu?      Excl. in [place]?    No data found.  Updated Vital Signs BP 129/79 (BP Location: Left Arm)   Pulse 82   Temp 97.6 F (36.4 C) (Oral)   Resp 18   Ht 5\' 3"  (1.6 m)   Wt 117 lb (53.1 kg)   SpO2 96%   BMI 20.73 kg/m   Visual Acuity Right Eye Distance:   Left Eye Distance:   Bilateral Distance:    Right Eye Near:   Left Eye Near:    Bilateral Near:     Physical Exam Vitals and nursing note reviewed.  Constitutional:      General: She is not in acute distress.    Appearance: She is well-developed. She is not ill-appearing.  HENT:     Head: Normocephalic and atraumatic.     Right Ear: Tympanic membrane normal.     Left Ear: Tympanic membrane normal.     Mouth/Throat:     Mouth: Mucous membranes are moist.     Pharynx: Oropharynx is clear.  Eyes:     Conjunctiva/sclera: Conjunctivae normal.     Pupils: Pupils are equal, round, and reactive to light.  Cardiovascular:     Rate and Rhythm: Normal rate and regular rhythm.     Heart sounds: Normal heart sounds. No murmur.  Pulmonary:     Effort: Pulmonary effort is normal. No respiratory distress.     Breath sounds: Normal breath sounds.  Abdominal:     General: Bowel sounds are normal.     Palpations: Abdomen is soft.     Tenderness: There is no abdominal tenderness. There is no guarding or rebound.  Musculoskeletal:     Cervical back: Neck supple.     Right lower leg: No edema.     Left lower leg: No edema.  Skin:    General: Skin is warm and dry.  Findings: No rash.  Neurological:     General: No focal deficit present.     Mental Status: She is alert and oriented to person, place, and time.     Cranial Nerves: No cranial nerve deficit.      Sensory: No sensory deficit.     Motor: No weakness.     Coordination: Romberg sign negative. Coordination normal.     Gait: Gait normal.  Psychiatric:        Mood and Affect: Mood normal.        Behavior: Behavior normal.      UC Treatments / Results  Labs (all labs ordered are listed, but only abnormal results are displayed) Labs Reviewed  CBC  COMPREHENSIVE METABOLIC PANEL  POCT URINALYSIS DIP (MANUAL ENTRY)    EKG   Radiology No results found.  Procedures Procedures (including critical care time)  Medications Ordered in UC Medications - No data to display  Initial Impression / Assessment and Plan / UC Course  I have reviewed the triage vital signs and the nursing notes.  Pertinent labs & imaging results that were available during my care of the patient were reviewed by me and considered in my medical decision making (see chart for details).   Dizziness, heart palpitations.  EKG shows sinus rhythm, rate 66, no ST elevation, no ectopy, no previous to compare.  Urine normal.  CBC and CMP results discussed with patient;  Instructed patient to call her PCP to schedule an appointment for follow-up tomorrow.  Discussed with her that her blood pressure was elevated today and needs to be rechecked by her PCP in 1 to 2 weeks.  Instructed her to go to the emergency department if she has acute worsening symptoms including dizziness or heart palpitations; or if she develops new symptoms such as weakness, chest pain, shortness of breath, or other concerns.  Patient agrees to plan of care.   Final Clinical Impressions(s) / UC Diagnoses   Final diagnoses:  Dizziness  Heart palpitations     Discharge Instructions     Your blood pressure is elevated today at 152/101.  Please have this rechecked by your primary care provider in 1-2 weeks.      Your lab work is pending; the results will be back tomorrow.  Your urine is normal.  Your EKG is normal.    Call your primary care  provider to schedule an appointment for follow-up tomorrow.    Go to the emergency department if you have acute worsening symptoms, including dizziness or heart palpitations.  Go to the emergency department if you have weakness, chest pain, shortness of breath, or other concerning symptoms.         ED Prescriptions    None     PDMP not reviewed this encounter.   Sharion Balloon, NP 03/29/20 1205    Sharion Balloon, NP 03/30/20 [phone]

## 2020-03-29 NOTE — ED Triage Notes (Signed)
Patient in office today c/o palpitations this morning around 3 a.m with heart racing patient googled and saw that it said drink water so she did and started to feel better.  Denies: N/V , diarrhea, no tingling in arm or heaviest on chest.  OTC: none

## 2020-03-29 NOTE — Discharge Instructions (Addendum)
Your blood pressure is elevated today at 152/101.  Please have this rechecked by your primary care provider in 1-2 weeks.      Your lab work is pending; the results will be back tomorrow.  Your urine is normal.  Your EKG is normal.    Call your primary care provider to schedule an appointment for follow-up tomorrow.    Go to the emergency department if you have acute worsening symptoms, including dizziness or heart palpitations.  Go to the emergency department if you have weakness, chest pain, shortness of breath, or other concerning symptoms.

## 2020-03-30 LAB — COMPREHENSIVE METABOLIC PANEL
ALT: 17 IU/L (ref 0–32)
AST: 16 IU/L (ref 0–40)
Albumin/Globulin Ratio: 2 (ref 1.2–2.2)
Albumin: 4.7 g/dL (ref 3.7–4.7)
Alkaline Phosphatase: 78 IU/L (ref 39–117)
BUN/Creatinine Ratio: 19 (ref 12–28)
BUN: 15 mg/dL (ref 8–27)
Bilirubin Total: 0.7 mg/dL (ref 0.0–1.2)
CO2: 27 mmol/L (ref 20–29)
Calcium: 9.9 mg/dL (ref 8.7–10.3)
Chloride: 101 mmol/L (ref 96–106)
Creatinine, Ser: 0.77 mg/dL (ref 0.57–1.00)
GFR calc Af Amer: 86 mL/min/{1.73_m2} (ref >59)
GFR calc non Af Amer: 75 mL/min/{1.73_m2} (ref >59)
Globulin, Total: 2.4 g/dL (ref 1.5–4.5)
Glucose: 113 mg/dL — ABNORMAL HIGH (ref 65–99)
Potassium: 3.5 mmol/L (ref 3.5–5.2)
Sodium: 144 mmol/L (ref 134–144)
Total Protein: 7.1 g/dL (ref 6.0–8.5)

## 2020-03-30 LAB — CBC
Hematocrit: 49 % — ABNORMAL HIGH (ref 34.0–46.6)
Hemoglobin: 16.3 g/dL — ABNORMAL HIGH (ref 11.1–15.9)
MCH: 30 pg (ref 26.6–33.0)
MCHC: 33.3 g/dL (ref 31.5–35.7)
MCV: 90 fL (ref 79–97)
Platelets: 266 10*3/uL (ref 150–450)
RBC: 5.43 x10E6/uL — ABNORMAL HIGH (ref 3.77–5.28)
RDW: 12.3 % (ref 11.7–15.4)
WBC: 5.1 10*3/uL (ref 3.4–10.8)

## 2020-04-23 ENCOUNTER — Other Ambulatory Visit: Payer: Self-pay | Admitting: Family Medicine

## 2020-04-23 NOTE — Telephone Encounter (Signed)
Requested Prescriptions  Pending Prescriptions Disp Refills  . hydrochlorothiazide (HYDRODIURIL) 25 MG tablet [Pharmacy Med Name: HYDROCHLOROTHIAZIDE 25 MG TAB] 90 tablet 1    Sig: TAKE 1 TABLET BY MOUTH ONCE DAILY     Cardiovascular: Diuretics - Thiazide Passed - 04/23/2020 10:08 AM      Passed - Ca in normal range and within 360 days    Calcium  Date Value Ref Range Status  03/29/2020 9.9 8.7 - 10.3 mg/dL Final         Passed - Cr in normal range and within 360 days    Creatinine, Ser  Date Value Ref Range Status  03/29/2020 0.77 0.57 - 1.00 mg/dL Final         Passed - K in normal range and within 360 days    Potassium  Date Value Ref Range Status  03/29/2020 3.5 3.5 - 5.2 mmol/L Final         Passed - Na in normal range and within 360 days    Sodium  Date Value Ref Range Status  03/29/2020 144 134 - 144 mmol/L Final         Passed - Last BP in normal range    BP Readings from Last 1 Encounters:  03/29/20 129/79         Passed - Valid encounter within last 6 months    Recent Outpatient Visits          1 month ago Lip lesion   [facility] [place], Dionne Bucy, MD   3 months ago Essential hypertension   [place], Dionne Bucy, MD   4 months ago Cellulitis of right hand   [place], Dionne Bucy, MD   5 months ago Essential hypertension   [place], Dionne Bucy, MD   6 months ago Tinnitus of both ears   [facility] [place], Dionne Bucy, MD

## 2020-07-20 DIAGNOSIS — L821 Other seborrheic keratosis: Secondary | ICD-10-CM | POA: Diagnosis not present

## 2020-07-20 DIAGNOSIS — Z85828 Personal history of other malignant neoplasm of skin: Secondary | ICD-10-CM | POA: Diagnosis not present

## 2020-07-20 DIAGNOSIS — L720 Epidermal cyst: Secondary | ICD-10-CM | POA: Diagnosis not present

## 2020-07-20 DIAGNOSIS — D225 Melanocytic nevi of trunk: Secondary | ICD-10-CM | POA: Diagnosis not present

## 2020-07-20 DIAGNOSIS — L814 Other melanin hyperpigmentation: Secondary | ICD-10-CM | POA: Diagnosis not present

## 2020-07-20 DIAGNOSIS — Z09 Encounter for follow-up examination after completed treatment for conditions other than malignant neoplasm: Secondary | ICD-10-CM | POA: Diagnosis not present

## 2020-07-20 DIAGNOSIS — Z872 Personal history of diseases of the skin and subcutaneous tissue: Secondary | ICD-10-CM | POA: Diagnosis not present

## 2020-07-20 DIAGNOSIS — Z08 Encounter for follow-up examination after completed treatment for malignant neoplasm: Secondary | ICD-10-CM | POA: Diagnosis not present

## 2020-08-09 NOTE — Progress Notes (Signed)
Pt cancelled apt.  This encounter was created in error - please disregard.

## 2020-08-10 ENCOUNTER — Other Ambulatory Visit: Payer: Self-pay

## 2020-08-10 ENCOUNTER — Encounter: Payer: Medicare HMO | Admitting: Family Medicine

## 2020-08-24 ENCOUNTER — Other Ambulatory Visit: Payer: Self-pay | Admitting: Family Medicine

## 2020-10-13 ENCOUNTER — Other Ambulatory Visit: Payer: Self-pay | Admitting: Family Medicine

## 2020-10-13 DIAGNOSIS — F5102 Adjustment insomnia: Secondary | ICD-10-CM

## 2020-10-13 NOTE — Telephone Encounter (Signed)
Copied from [place] [id number]. Topic: Quick Communication - Rx Refill/Question >> Oct 13, 2020  8:53 AM Leward Quan A wrote: Medication: zolpidem (AMBIEN) 5 MG tablet   Has the patient contacted their pharmacy? Yes.   (Agent: If no, request that the patient contact the pharmacy for the refill.) (Agent: If yes, when and what did the pharmacy advise?)  Preferred Pharmacy (with phone number or street name): [facility] [place], [place].  Phone:  [phone] Fax:  [phone]     Agent: Please be advised that RX refills may take up to 3 business days. We ask that you follow-up with your pharmacy.

## 2020-10-13 NOTE — Telephone Encounter (Signed)
Requested medication (s) are due for refill today: no  Requested medication (s) are on the active medication list: yes  Future visit scheduled: yes  Notes to clinic: this refill cannot be delegated    Requested Prescriptions  Pending Prescriptions Disp Refills   zolpidem (AMBIEN) 5 MG tablet 15 tablet 2    Sig: TAKE 1/2 TABLET BY MOUTH AT BEDTIME AS NEEDED SLEEP      Not Delegated - Psychiatry:  Anxiolytics/Hypnotics Failed - 10/13/2020  9:00 AM      Failed - This refill cannot be delegated      Failed - Urine Drug Screen completed in last 360 days.      Failed - Valid encounter within last 6 months    Recent Outpatient Visits           7 months ago Lip lesion   [facility] [place], Dionne Bucy, MD   9 months ago Essential hypertension   [place], Dionne Bucy, MD   10 months ago Cellulitis of right hand   [facility] [place], Dionne Bucy, MD   11 months ago Essential hypertension   [facility], Dionne Bucy, MD   11 months ago Tinnitus of both ears   [facility] [place], Dionne Bucy, MD

## 2020-10-13 NOTE — Telephone Encounter (Signed)
Requested medication (s) are due for refill today: yes  Requested medication (s) are on the active medication list: yes  Last refill:  06/28/20  Future visit scheduled: yes  Notes to clinic:  not delegated    Requested Prescriptions  Pending Prescriptions Disp Refills   zolpidem (AMBIEN) 5 MG tablet [Pharmacy Med Name: ZOLPIDEM TARTRATE 5 MG TAB] 15 tablet     Sig: TAKE 1/2 TABLET BY MOUTH AT BEDTIME AS NEEDED FOR SLEEP      Not Delegated - Psychiatry:  Anxiolytics/Hypnotics Failed - 10/13/2020  9:14 AM      Failed - This refill cannot be delegated      Failed - Urine Drug Screen completed in last 360 days.      Failed - Valid encounter within last 6 months    Recent Outpatient Visits           7 months ago Lip lesion   [facility] [place], Dionne Bucy, MD   9 months ago Essential hypertension   [place], Dionne Bucy, MD   10 months ago Cellulitis of right hand   [facility] [place], Dionne Bucy, MD   11 months ago Essential hypertension   [facility], Dionne Bucy, MD   11 months ago Tinnitus of both ears   [facility] [place], Dionne Bucy, MD

## 2020-10-17 MED ORDER — ZOLPIDEM TARTRATE 5 MG PO TABS: ORAL_TABLET | ORAL | 2 refills | Status: DC

## 2020-10-24 DIAGNOSIS — L309 Dermatitis, unspecified: Secondary | ICD-10-CM | POA: Diagnosis not present

## 2020-10-24 DIAGNOSIS — L820 Inflamed seborrheic keratosis: Secondary | ICD-10-CM | POA: Diagnosis not present

## 2020-10-24 DIAGNOSIS — L538 Other specified erythematous conditions: Secondary | ICD-10-CM | POA: Diagnosis not present

## 2020-10-28 ENCOUNTER — Other Ambulatory Visit: Payer: Self-pay

## 2020-10-28 ENCOUNTER — Ambulatory Visit (INDEPENDENT_AMBULATORY_CARE_PROVIDER_SITE_OTHER): Payer: Medicare HMO | Admitting: Family Medicine

## 2020-10-28 ENCOUNTER — Encounter: Payer: Self-pay | Admitting: Family Medicine

## 2020-10-28 VITALS — BP 127/79 | HR 58 | Temp 98.2°F | Resp 16 | Ht 63.0 in | Wt 117.3 lb

## 2020-10-28 DIAGNOSIS — I10 Essential (primary) hypertension: Secondary | ICD-10-CM | POA: Diagnosis not present

## 2020-10-28 DIAGNOSIS — D582 Other hemoglobinopathies: Secondary | ICD-10-CM

## 2020-10-28 DIAGNOSIS — Z1231 Encounter for screening mammogram for malignant neoplasm of breast: Secondary | ICD-10-CM

## 2020-10-28 DIAGNOSIS — Z0000 Encounter for general adult medical examination without abnormal findings: Secondary | ICD-10-CM

## 2020-10-28 NOTE — Patient Instructions (Signed)
Preventive Care 38 Years and Older, Female Preventive care refers to lifestyle choices and visits with your health care provider that can promote health and wellness. This includes:  A yearly physical exam. This is also called an annual well check.  Regular dental and eye exams.  Immunizations.  Screening for certain conditions.  Healthy lifestyle choices, such as diet and exercise. What can I expect for my preventive care visit? Physical exam Your health care provider will check:  Height and weight. These may be used to calculate body mass index (BMI), which is a measurement that tells if you are at a healthy weight.  Heart rate and blood pressure.  Your skin for abnormal spots. Counseling Your health care provider may ask you questions about:  Alcohol, tobacco, and drug use.  Emotional well-being.  Home and relationship well-being.  Sexual activity.  Eating habits.  History of falls.  Memory and ability to understand (cognition).  Work and work Statistician.  Pregnancy and menstrual history. What immunizations do I need?  Influenza (flu) vaccine  This is recommended every year. Tetanus, diphtheria, and pertussis (Tdap) vaccine  You may need a Td booster every 10 years. Varicella (chickenpox) vaccine  You may need this vaccine if you have not already been vaccinated. Zoster (shingles) vaccine  You may need this after age 33. Pneumococcal conjugate (PCV13) vaccine  One dose is recommended after age 33. Pneumococcal polysaccharide (PPSV23) vaccine  One dose is recommended after age 72. Measles, mumps, and rubella (MMR) vaccine  You may need at least one dose of MMR if you were born in 1957 or later. You may also need a second dose. Meningococcal conjugate (MenACWY) vaccine  You may need this if you have certain conditions. Hepatitis A vaccine  You may need this if you have certain conditions or if you travel or work in places where you may be exposed  to hepatitis A. Hepatitis B vaccine  You may need this if you have certain conditions or if you travel or work in places where you may be exposed to hepatitis B. Haemophilus influenzae type b (Hib) vaccine  You may need this if you have certain conditions. You may receive vaccines as individual doses or as more than one vaccine together in one shot (combination vaccines). Talk with your health care provider about the risks and benefits of combination vaccines. What tests do I need? Blood tests  Lipid and cholesterol levels. These may be checked every 5 years, or more frequently depending on your overall health.  Hepatitis C test.  Hepatitis B test. Screening  Lung cancer screening. You may have this screening every year starting at age 39 if you have a 30-pack-year history of smoking and currently smoke or have quit within the past 15 years.  Colorectal cancer screening. All adults should have this screening starting at age 36 and continuing until age 15. Your health care provider may recommend screening at age 23 if you are at increased risk. You will have tests every 1-10 years, depending on your results and the type of screening test.  Diabetes screening. This is done by checking your blood sugar (glucose) after you have not eaten for a while (fasting). You may have this done every 1-3 years.  Mammogram. This may be done every 1-2 years. Talk with your health care provider about how often you should have regular mammograms.  BRCA-related cancer screening. This may be done if you have a family history of breast, ovarian, tubal, or peritoneal cancers.  Other tests  Sexually transmitted disease (STD) testing.  Bone density scan. This is done to screen for osteoporosis. You may have this done starting at age 44. Follow these instructions at home: Eating and drinking  Eat a diet that includes fresh fruits and vegetables, whole grains, lean protein, and low-fat dairy products. Limit  your intake of foods with high amounts of sugar, saturated fats, and salt.  Take vitamin and mineral supplements as recommended by your health care provider.  Do not drink alcohol if your health care provider tells you not to drink.  If you drink alcohol: ? Limit how much you have to 0-1 drink a day. ? Be aware of how much alcohol is in your drink. In the [place] one drink equals one 12 oz bottle of beer (355 mL), one 5 oz glass of wine (148 mL), or one 1 oz glass of hard liquor (44 mL). Lifestyle  Take daily care of your teeth and gums.  Stay active. Exercise for at least 30 minutes on 5 or more days each week.  Do not use any products that contain nicotine or tobacco, such as cigarettes, e-cigarettes, and chewing tobacco. If you need help quitting, ask your health care provider.  If you are sexually active, practice safe sex. Use a condom or other form of protection in order to prevent STIs (sexually transmitted infections).  Talk with your health care provider about taking a low-dose aspirin or statin. What's next?  Go to your health care provider once a year for a well check visit.  Ask your health care provider how often you should have your eyes and teeth checked.  Stay up to date on all vaccines. This information is not intended to replace advice given to you by your health care provider. Make sure you discuss any questions you have with your health care provider. Document Revised: 11/27/2018 Document Reviewed: 11/27/2018 Elsevier Patient Education  2020 [employer].

## 2020-10-28 NOTE — Assessment & Plan Note (Signed)
Well controlled Continue current medications Recheck metabolic panel F/u in 6 months  

## 2020-10-28 NOTE — Progress Notes (Signed)
Annual Wellness Visit     Patient: Alexis Lyons, Female    DOB: May 27, 1941, 79 y.o.   MRN: [id number] Visit Date: 10/28/2020  Today's Provider: Lavon Paganini, MD   Chief Complaint  Patient presents with  . Medicare Wellness   Subjective    Alexis Lyons is a 79 y.o. female who presents today for her Annual Wellness Visit. She reports consuming a general diet. Home exercise routine includes walking and running. She generally feels well. She reports sleeping well. She does have additional problems to discuss today.   HPI   Patient Active Problem List   Diagnosis Date Noted  . Chronic headache 01/01/2019  . Essential hypertension 07/09/2018  . Osteopenia 11/06/2017  . Seborrheic dermatitis 11/05/2017  . Aphthae 05/28/2015  . DD (diverticular disease) 05/28/2015  . Fatigue 05/28/2015  . Hernia, inguinal, right 05/28/2015  . Amnesia 05/28/2015  . 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 Atrophy of vagina 05/28/2015  . Vaginal lesion 09/21/2008  . Acid reflux 06/15/2008  . Insomnia 12/17/2004   Past Medical History:  Diagnosis Date  . Amnesia   . Aphthae   . Congestion-fibrosis syndrome   . DD (diverticular disease)   . GERD (gastroesophageal reflux disease)   . Seizures ([place]) 12/2017   passed out due to dehydration   Past Surgical History:  Procedure Laterality Date  . CHOLECYSTECTOMY    . colonoscopy with polypectomy     8/11 4 mm polyp, 12 mm polyp  . COLONOSCOPY WITH PROPOFOL N/A 02/24/2018   Procedure: COLONOSCOPY WITH PROPOFOL;  Surgeon: Manya Silvas, MD;  Location: [facility] ENDOSCOPY;  Service: Endoscopy;  Laterality: N/A;  . HERNIA REPAIR Right 2004   inguinal   . HERNIA REPAIR     x's 3   Social History   Tobacco Use  . Smoking status: Former Smoker    Packs/day: 0.50    Years: 15.00    Pack years: 7.50    Types: Cigarettes    Quit date: 12/16/1985    Years since quitting: 34.8  . Smokeless tobacco: Never Used  Vaping Use    . Vaping Use: Never used  Substance Use Topics  . Alcohol use: Yes    Alcohol/week: 7.0 standard drinks    Types: 7 Glasses of wine per week    Comment: glass of wine with dinner  . Drug use: No   Family History  Problem Relation Age of Onset  . Alzheimer's disease Mother   . Alcohol abuse Father   . Cancer Father        liver cancer  . Cancer Sister        breast  . Breast cancer Sister    Allergies  Allergen Reactions  . Codeine        Medications: Outpatient Medications Prior to Visit  Medication Sig  . Ginseng 100 MG CAPS every other day.   . hydrochlorothiazide (HYDRODIURIL) 25 MG tablet TAKE 1 TABLET BY MOUTH ONCE DAILY  . zolpidem (AMBIEN) 5 MG tablet TAKE 1/2 TABLET BY MOUTH AT BEDTIME AS NEEDED SLEEP  . ketoconazole (NIZORAL) 2 % shampoo Apply topically.   No facility-administered medications prior to visit.    Allergies  Allergen Reactions  . Codeine     Patient Care Team: Virginia Crews, MD as PCP - General (Family Medicine)  Review of Systems  Constitutional: Negative.   HENT: Negative.   Eyes: Negative.   Respiratory: Negative.   Cardiovascular: Negative.   Gastrointestinal: Negative.  Endocrine: Negative.   Genitourinary: Negative.   Musculoskeletal: Negative.   Skin: Negative.     Last CBC Lab Results  Component Value Date   WBC 5.1 03/29/2020   HGB 16.3 (H) 03/29/2020   HCT 49.0 (H) 03/29/2020   MCV 90 03/29/2020   MCH 30.0 03/29/2020   RDW 12.3 03/29/2020   PLT 266 71/69/6789   Last metabolic panel Lab Results  Component Value Date   GLUCOSE 113 (H) 03/29/2020   NA 144 03/29/2020   K 3.5 03/29/2020   CL 101 03/29/2020   CO2 27 03/29/2020   BUN 15 03/29/2020   CREATININE 0.77 03/29/2020   GFRNONAA 75 03/29/2020   GFRAA 86 03/29/2020   CALCIUM 9.9 03/29/2020   PHOS 3.2 12/23/2017   PROT 7.1 03/29/2020   ALBUMIN 4.7 03/29/2020   LABGLOB 2.4 03/29/2020   AGRATIO 2.0 03/29/2020   BILITOT 0.7 03/29/2020   ALKPHOS  78 03/29/2020   AST 16 03/29/2020   ALT 17 03/29/2020   Last lipids Lab Results  Component Value Date   CHOL 156 07/29/2019   HDL 69 07/29/2019   LDLCALC 71 07/29/2019   TRIG 78 07/29/2019   CHOLHDL 2.3 07/29/2019   Last hemoglobin A1c No results found for: HGBA1C Last thyroid functions Lab Results  Component Value Date   TSH 2.42 03/16/2015   Last vitamin D No results found for: 25OHVITD2, 25OHVITD3, VD25OH Last vitamin B12 and Folate Lab Results  Component Value Date   VITAMINB12 143 07/07/2019      Objective    Vitals: BP 127/79 (BP Location: Left Arm, Patient Position: Sitting, Cuff Size: Normal)   Pulse (!) 58   Temp 98.2 F (36.8 C) (Oral)   Resp 16   Ht 5\' 3"  (1.6 m)   Wt 117 lb 4.8 oz (53.2 kg)   BMI 20.78 kg/m  BP Readings from Last 3 Encounters:  10/28/20 127/79  03/29/20 129/79  03/15/20 130/83   Wt Readings from Last 3 Encounters:  10/28/20 117 lb 4.8 oz (53.2 kg)  03/29/20 117 lb (53.1 kg)  03/15/20 117 lb (53.1 kg)      Physical Exam Vitals reviewed.  Constitutional:      General: She is not in acute distress.    Appearance: Normal appearance. She is well-developed. She is not diaphoretic.  HENT:     Head: Normocephalic and atraumatic.  Eyes:     General: No scleral icterus.    Conjunctiva/sclera: Conjunctivae normal.     Pupils: Pupils are equal, round, and reactive to light.  Neck:     Thyroid: No thyromegaly.  Cardiovascular:     Rate and Rhythm: Normal rate and regular rhythm.     Pulses: Normal pulses.     Heart sounds: Normal heart sounds. No murmur heard.   Pulmonary:     Effort: Pulmonary effort is normal. No respiratory distress.     Breath sounds: Normal breath sounds. No wheezing or rales.  Chest:     Comments: Breasts: breasts appear normal, no suspicious masses, no skin or nipple changes or axillary nodes.  Abdominal:     General: There is no distension.     Palpations: Abdomen is soft.     Tenderness: There is no  abdominal tenderness.  Musculoskeletal:        General: No deformity.     Cervical back: Neck supple.     Right lower leg: No edema.     Left lower leg: No edema.  Lymphadenopathy:  Cervical: No cervical adenopathy.  Skin:    General: Skin is warm and dry.     Findings: No rash.  Neurological:     Mental Status: She is alert and oriented to person, place, and time. Mental status is at baseline.     Gait: Gait normal.  Psychiatric:        Mood and Affect: Mood normal.        Behavior: Behavior normal.        Thought Content: Thought content normal.      Most recent functional status assessment: In your present state of health, do you have any difficulty performing the following activities: 10/28/2020  Hearing? N  Vision? N  Difficulty concentrating or making decisions? N  Walking or climbing stairs? N  Dressing or bathing? N  Doing errands, shopping? N  Some recent data might be hidden   Most recent fall risk assessment: Fall Risk  10/28/2020  Falls in the past year? 0  Number falls in past yr: 0  Injury with Fall? 0  Risk for fall due to : No Fall Risks  Follow up Falls evaluation completed    Most recent depression screenings: PHQ 2/9 Scores 10/28/2020 07/29/2019  PHQ - 2 Score 0 0  PHQ- 9 Score 0 0   Most recent cognitive screening: 6CIT Screen 10/28/2020  What Year? 0 points  What month? 0 points  What time? 0 points  Count back from 20 0 points  Months in reverse 0 points  Repeat phrase 2 points  Total Score 2   Most recent Audit-C alcohol use screening Alcohol Use Disorder Test (AUDIT) 10/28/2020  1. How often do you have a drink containing alcohol? 3  2. How many drinks containing alcohol do you have on a typical day when you are drinking? 0  3. How often do you have six or more drinks on one occasion? 0  AUDIT-C Score 3  4. How often during the last year have you found that you were not able to stop drinking once you had started? 0  5. How often  during the last year have you failed to do what was normally expected from you because of drinking? 0  6. How often during the last year have you needed a first drink in the morning to get yourself going after a heavy drinking session? 0  7. How often during the last year have you had a feeling of guilt of remorse after drinking? 0  8. How often during the last year have you been unable to remember what happened the night before because you had been drinking? 0  9. Have you or someone else been injured as a result of your drinking? 0  10. Has a relative or friend or a doctor or another health worker been concerned about your drinking or suggested you cut down? 0  Alcohol Use Disorder Identification Test Final Score (AUDIT) 3  Alcohol Brief Interventions/Follow-up AUDIT Score <7 follow-up not indicated   A score of 3 or more in women, and 4 or more in men indicates increased risk for alcohol abuse, EXCEPT if all of the points are from question 1   No results found for any visits on 10/28/20.  Assessment & Plan     Annual wellness visit done today including the all of the following: Reviewed patient's Family Medical History Reviewed and updated list of patient's medical providers Assessment of cognitive impairment was done Assessed patient's functional ability Established a written schedule  for health screening [place] Completed and Reviewed  Exercise Activities and Dietary recommendations Goals   None     Immunization History  Administered Date(s) Administered  . Hepatitis A 07/26/2015  . PFIZER SARS-COV-2 Vaccination 01/06/2020, 01/25/2020  . Pneumococcal Conjugate-13 07/29/2019  . Pneumococcal Polysaccharide-23 01/01/2011  . Td 06/23/2008  . Tdap 11/30/2019  . Typhoid Inactivated 07/26/2015  . Zoster Recombinat (Shingrix) 06/25/2019, 10/06/2019    Health Maintenance  Topic Date Due  . DEXA SCAN  07/02/2013  . INFLUENZA VACCINE  03/16/2021  (Originally 07/17/2020)  . TETANUS/TDAP  11/29/2029  . COVID-19 Vaccine  Completed  . PNA vac Low Risk Adult  Completed     Discussed health benefits of physical activity, and encouraged her to engage in regular exercise appropriate for her age and condition.    Problem List Items Addressed This Visit      Cardiovascular and Mediastinum   Essential hypertension    Well controlled Continue current medications Recheck metabolic panel F/u in 6 months       Relevant Orders   Lipid panel   Comprehensive metabolic panel    Other Visit Diagnoses    Encounter for annual wellness visit (AWV) in Medicare patient    -  Primary   Encounter for annual physical exam       Relevant Orders   Lipid panel   Comprehensive metabolic panel   CBC   Elevated hemoglobin ([facility])       Relevant Orders   CBC   Screening mammogram for breast cancer       Relevant Orders   MM 3D SCREEN BREAST BILATERAL       Return in about 6 months (around 04/27/2021) for chronic disease f/u.     I, Lavon Paganini, MD, have reviewed all documentation for this visit. The documentation on 10/28/20 for the exam, diagnosis, procedures, and orders are all accurate and complete.   Xayden Linsey, Dionne Bucy, MD, MPH [facility]

## 2020-11-07 DIAGNOSIS — D582 Other hemoglobinopathies: Secondary | ICD-10-CM | POA: Diagnosis not present

## 2020-11-07 DIAGNOSIS — Z0000 Encounter for general adult medical examination without abnormal findings: Secondary | ICD-10-CM | POA: Diagnosis not present

## 2020-11-07 DIAGNOSIS — I10 Essential (primary) hypertension: Secondary | ICD-10-CM | POA: Diagnosis not present

## 2020-11-08 LAB — LIPID PANEL
Chol/HDL Ratio: 2.5 ratio (ref 0.0–4.4)
Cholesterol, Total: 196 mg/dL (ref 100–199)
HDL: 78 mg/dL
LDL Chol Calc (NIH): 108 mg/dL — ABNORMAL HIGH (ref 0–99)
Triglycerides: 53 mg/dL (ref 0–149)
VLDL Cholesterol Cal: 10 mg/dL (ref 5–40)

## 2020-11-08 LAB — COMPREHENSIVE METABOLIC PANEL WITH GFR
ALT: 18 IU/L (ref 0–32)
AST: 19 IU/L (ref 0–40)
Albumin/Globulin Ratio: 1.9 (ref 1.2–2.2)
Albumin: 4.5 g/dL (ref 3.7–4.7)
Alkaline Phosphatase: 70 IU/L (ref 44–121)
BUN/Creatinine Ratio: 16 (ref 12–28)
BUN: 14 mg/dL (ref 8–27)
Bilirubin Total: 0.8 mg/dL (ref 0.0–1.2)
CO2: 29 mmol/L (ref 20–29)
Calcium: 9.9 mg/dL (ref 8.7–10.3)
Chloride: 101 mmol/L (ref 96–106)
Creatinine, Ser: 0.87 mg/dL (ref 0.57–1.00)
GFR calc Af Amer: 74 mL/min/1.73
GFR calc non Af Amer: 64 mL/min/1.73
Globulin, Total: 2.4 g/dL (ref 1.5–4.5)
Glucose: 98 mg/dL (ref 65–99)
Potassium: 3.7 mmol/L (ref 3.5–5.2)
Sodium: 142 mmol/L (ref 134–144)
Total Protein: 6.9 g/dL (ref 6.0–8.5)

## 2020-11-08 LAB — CBC
Hematocrit: 46.5 % (ref 34.0–46.6)
Hemoglobin: 15.7 g/dL (ref 11.1–15.9)
MCH: 29.6 pg (ref 26.6–33.0)
MCHC: 33.8 g/dL (ref 31.5–35.7)
MCV: 88 fL (ref 79–97)
Platelets: 253 10*3/uL (ref 150–450)
RBC: 5.3 x10E6/uL — ABNORMAL HIGH (ref 3.77–5.28)
RDW: 12.4 % (ref 11.7–15.4)
WBC: 4.5 10*3/uL (ref 3.4–10.8)

## 2020-11-15 DIAGNOSIS — H0288B Meibomian gland dysfunction left eye, upper and lower eyelids: Secondary | ICD-10-CM | POA: Diagnosis not present

## 2020-11-15 DIAGNOSIS — H04123 Dry eye syndrome of bilateral lacrimal glands: Secondary | ICD-10-CM | POA: Diagnosis not present

## 2020-11-15 DIAGNOSIS — H26493 Other secondary cataract, bilateral: Secondary | ICD-10-CM | POA: Diagnosis not present

## 2020-11-15 DIAGNOSIS — H0288A Meibomian gland dysfunction right eye, upper and lower eyelids: Secondary | ICD-10-CM | POA: Diagnosis not present

## 2020-11-16 ENCOUNTER — Telehealth: Payer: Self-pay

## 2020-11-16 NOTE — Telephone Encounter (Signed)
Written by Virginia Crews, MD on 11/14/2020 10:38 AM EST Seen by patient Alexis Lyons on 11/14/2020 9:55 PM

## 2020-11-16 NOTE — Telephone Encounter (Signed)
-----   Message from Virginia Crews, MD sent at 11/14/2020 10:38 AM EST ----- Normal labs

## 2020-11-29 ENCOUNTER — Other Ambulatory Visit: Payer: Self-pay | Admitting: Family Medicine

## 2020-12-22 ENCOUNTER — Telehealth: Payer: Self-pay

## 2020-12-22 NOTE — Telephone Encounter (Signed)
Copied from CRM [id number]. Topic: General - Other >> Dec 22, 2020 12:09 PM Lyn Hollingshead D wrote: Pt need her booster for pfizer / second doze 01/25/20 / please advise

## 2020-12-23 NOTE — Telephone Encounter (Signed)
Pt called to see if she could come in today to get the booster /please advise

## 2020-12-23 NOTE — Telephone Encounter (Signed)
Apt for 12/28/2020 at 2:30   Thanks,   -Mickel Baas

## 2020-12-28 ENCOUNTER — Ambulatory Visit (INDEPENDENT_AMBULATORY_CARE_PROVIDER_SITE_OTHER): Payer: Medicare HMO

## 2020-12-28 ENCOUNTER — Other Ambulatory Visit: Payer: Self-pay

## 2020-12-28 DIAGNOSIS — Z23 Encounter for immunization: Secondary | ICD-10-CM

## 2021-01-04 DIAGNOSIS — Z85828 Personal history of other malignant neoplasm of skin: Secondary | ICD-10-CM | POA: Diagnosis not present

## 2021-01-04 DIAGNOSIS — Z08 Encounter for follow-up examination after completed treatment for malignant neoplasm: Secondary | ICD-10-CM | POA: Diagnosis not present

## 2021-01-04 DIAGNOSIS — D2262 Melanocytic nevi of left upper limb, including shoulder: Secondary | ICD-10-CM | POA: Diagnosis not present

## 2021-01-04 DIAGNOSIS — D2261 Melanocytic nevi of right upper limb, including shoulder: Secondary | ICD-10-CM | POA: Diagnosis not present

## 2021-01-04 DIAGNOSIS — L300 Nummular dermatitis: Secondary | ICD-10-CM | POA: Diagnosis not present

## 2021-01-04 DIAGNOSIS — D225 Melanocytic nevi of trunk: Secondary | ICD-10-CM | POA: Diagnosis not present

## 2021-01-04 DIAGNOSIS — R202 Paresthesia of skin: Secondary | ICD-10-CM | POA: Diagnosis not present

## 2021-01-04 DIAGNOSIS — Z872 Personal history of diseases of the skin and subcutaneous tissue: Secondary | ICD-10-CM | POA: Diagnosis not present

## 2021-01-04 DIAGNOSIS — Z09 Encounter for follow-up examination after completed treatment for conditions other than malignant neoplasm: Secondary | ICD-10-CM | POA: Diagnosis not present

## 2021-01-12 ENCOUNTER — Encounter: Payer: Self-pay | Admitting: Family Medicine

## 2021-01-12 ENCOUNTER — Other Ambulatory Visit: Payer: Self-pay

## 2021-01-12 ENCOUNTER — Ambulatory Visit (INDEPENDENT_AMBULATORY_CARE_PROVIDER_SITE_OTHER): Payer: Medicare HMO | Admitting: Family Medicine

## 2021-01-12 VITALS — BP 145/82 | HR 97 | Temp 97.9°F | Wt 120.0 lb

## 2021-01-12 DIAGNOSIS — R220 Localized swelling, mass and lump, head: Secondary | ICD-10-CM

## 2021-01-12 NOTE — Progress Notes (Signed)
Acute Office Visit  Subjective:    Patient ID: Alexis Lyons, female    DOB: 1941/07/04, 80 y.o.   MRN: [id number]  No chief complaint on file.   HPI Patient is in today for evaluation of swollen lips.  Patient states she received her Covid booster on 12/28/20 and shortly after that she noticed her lips beginning to swell.  She has had dermal filler in the past.  Past Medical History:  Diagnosis Date  . Amnesia   . Aphthae   . Congestion-fibrosis syndrome   . DD (diverticular disease)   . GERD (gastroesophageal reflux disease)   . Seizures ([place]) 12/2017   passed out due to dehydration    Past Surgical History:  Procedure Laterality Date  . CHOLECYSTECTOMY    . colonoscopy with polypectomy     8/11 4 mm polyp, 12 mm polyp  . COLONOSCOPY WITH PROPOFOL N/A 02/24/2018   Procedure: COLONOSCOPY WITH PROPOFOL;  Surgeon: Manya Silvas, MD;  Location: [facility] ENDOSCOPY;  Service: Endoscopy;  Laterality: N/A;  . HERNIA REPAIR Right 2004   inguinal   . HERNIA REPAIR     x's 3    Family History  Problem Relation Age of Onset  . Alzheimer's disease Mother   . Alcohol abuse Father   . Cancer Father        liver cancer  . Cancer Sister        breast  . Breast cancer Sister     Social History   Socioeconomic History  . Marital status: Divorced    Spouse name: Not on file  . Number of children: 2  . Years of education: 55  . Highest education level: Not on file  Occupational History  . Occupation: [employer] in [place] Use  . Smoking status: Former Smoker    Packs/day: 0.50    Years: 15.00    Pack years: 7.50    Types: Cigarettes    Quit date: 12/16/1985    Years since quitting: 35.0  . Smokeless tobacco: Never Used  Vaping Use  . Vaping Use: Never used  Substance and Sexual Activity  . Alcohol use: Yes    Alcohol/week: 7.0 standard drinks    Types: 7 Glasses of wine per week    Comment: glass of wine with dinner  . Drug use: No  . Sexual activity:  Not Currently  Other Topics Concern  . Not on file  Social History Narrative  . Not on file   Social Determinants of Health   Financial Resource Strain: Not on file  Food Insecurity: Not on file  Transportation Needs: Not on file  Physical Activity: Not on file  Stress: Not on file  Social Connections: Not on file  Intimate Partner Violence: Not on file    Outpatient Medications Prior to Visit  Medication Sig Dispense Refill  . Ginseng 100 MG CAPS every other day.     . hydrochlorothiazide (HYDRODIURIL) 25 MG tablet TAKE 1 TABLET BY MOUTH ONCE DAILY 90 tablet 1  . ketoconazole (NIZORAL) 2 % shampoo Apply topically.    Marland Kitchen zolpidem (AMBIEN) 5 MG tablet TAKE 1/2 TABLET BY MOUTH AT BEDTIME AS NEEDED SLEEP 15 tablet 2   No facility-administered medications prior to visit.    Allergies  Allergen Reactions  . Codeine     Review of Systems  Constitutional: Negative.   HENT: Negative.   Respiratory: Negative.   Cardiovascular: Negative.   Gastrointestinal: Negative.   Skin:  Swelling in lips       Objective:    Physical Exam Constitutional:      General: She is not in acute distress.    Appearance: She is well-developed and well-nourished.  HENT:     Head: Normocephalic and atraumatic.     Right Ear: Hearing normal.     Left Ear: Hearing normal.     Nose: Nose normal.     Mouth/Throat:     Comments: Swelling of the lower lip with history of cosmetic filler injections. Eyes:     General: Lids are normal. No scleral icterus.       Right eye: No discharge.        Left eye: No discharge.     Conjunctiva/sclera: Conjunctivae normal.  Pulmonary:     Effort: Pulmonary effort is normal. No respiratory distress.  Musculoskeletal:        General: Normal range of motion.  Skin:    General: Skin is intact.     Findings: No lesion or rash.  Neurological:     Mental Status: She is alert and oriented to person, place, and time.  Psychiatric:        Mood and Affect:  Mood and affect normal.        Speech: Speech normal.        Behavior: Behavior normal.        Thought Content: Thought content normal.     BP (!) 145/82 (BP Location: Right Arm, Patient Position: Sitting, Cuff Size: Normal)   Pulse 97   Temp 97.9 F (36.6 C) (Oral)   Wt 120 lb (54.4 kg)   SpO2 97%   BMI 21.26 kg/m  Wt Readings from Last 3 Encounters:  01/12/21 120 lb (54.4 kg)  10/28/20 117 lb 4.8 oz (53.2 kg)  03/29/20 117 lb (53.1 kg)    Health Maintenance Due  Topic Date Due  . DEXA SCAN  07/02/2013    There are no preventive care reminders to display for this patient.   Lab Results  Component Value Date   TSH 2.42 03/16/2015   Lab Results  Component Value Date   WBC 4.5 11/07/2020   HGB 15.7 11/07/2020   HCT 46.5 11/07/2020   MCV 88 11/07/2020   PLT 253 11/07/2020   Lab Results  Component Value Date   NA 142 11/07/2020   K 3.7 11/07/2020   CO2 29 11/07/2020   GLUCOSE 98 11/07/2020   BUN 14 11/07/2020   CREATININE 0.87 11/07/2020   BILITOT 0.8 11/07/2020   ALKPHOS 70 11/07/2020   AST 19 11/07/2020   ALT 18 11/07/2020   PROT 6.9 11/07/2020   ALBUMIN 4.5 11/07/2020   CALCIUM 9.9 11/07/2020   Lab Results  Component Value Date   CHOL 196 11/07/2020   Lab Results  Component Value Date   HDL 78 11/07/2020   Lab Results  Component Value Date   LDLCALC 108 (H) 11/07/2020   Lab Results  Component Value Date   TRIG 53 11/07/2020   Lab Results  Component Value Date   CHOLHDL 2.5 11/07/2020   No results found for: HGBA1C     Assessment & Plan:   1. Swollen lip Onset over the past couple weeks after having her COVID booster injection on 12-28-20. No difficulty breathing, hives or itching. History of cosmetic dermal filler injections in the past. May need to follow up with dermatologist prn.    No orders of the defined types were placed in this  encounter.  Juluis Mire, CMA   I, Vernie Murders, PA-C, have reviewed all documentation  for this visit. The documentation on 01/12/21 for the exam, diagnosis, procedures, and orders are all accurate and complete.

## 2021-01-31 DIAGNOSIS — H18413 Arcus senilis, bilateral: Secondary | ICD-10-CM | POA: Diagnosis not present

## 2021-01-31 DIAGNOSIS — H26491 Other secondary cataract, right eye: Secondary | ICD-10-CM | POA: Diagnosis not present

## 2021-01-31 DIAGNOSIS — Z961 Presence of intraocular lens: Secondary | ICD-10-CM | POA: Diagnosis not present

## 2021-01-31 DIAGNOSIS — H04123 Dry eye syndrome of bilateral lacrimal glands: Secondary | ICD-10-CM | POA: Diagnosis not present

## 2021-02-22 ENCOUNTER — Other Ambulatory Visit: Payer: Self-pay | Admitting: Family Medicine

## 2021-03-14 ENCOUNTER — Ambulatory Visit
Admission: EM | Admit: 2021-03-14 | Discharge: 2021-03-14 | Disposition: A | Discharge status: Home / Self Care | Payer: Medicare HMO | Attending: Family Medicine | Admitting: Family Medicine

## 2021-03-14 ENCOUNTER — Emergency Department
Admission: EM | Admit: 2021-03-14 | Discharge: 2021-03-14 | Disposition: A | Discharge status: Home / Self Care | Payer: Medicare HMO | Attending: Emergency Medicine | Admitting: Emergency Medicine

## 2021-03-14 ENCOUNTER — Other Ambulatory Visit: Payer: Self-pay

## 2021-03-14 ENCOUNTER — Encounter: Payer: Self-pay | Admitting: Emergency Medicine

## 2021-03-14 ENCOUNTER — Ambulatory Visit: Payer: Self-pay | Admitting: *Deleted

## 2021-03-14 DIAGNOSIS — I4891 Unspecified atrial fibrillation: Secondary | ICD-10-CM | POA: Diagnosis not present

## 2021-03-14 DIAGNOSIS — Z743 Need for continuous supervision: Secondary | ICD-10-CM | POA: Diagnosis not present

## 2021-03-14 DIAGNOSIS — Z79899 Other long term (current) drug therapy: Secondary | ICD-10-CM | POA: Insufficient documentation

## 2021-03-14 DIAGNOSIS — R42 Dizziness and giddiness: Secondary | ICD-10-CM | POA: Diagnosis not present

## 2021-03-14 DIAGNOSIS — Z87891 Personal history of nicotine dependence: Secondary | ICD-10-CM | POA: Insufficient documentation

## 2021-03-14 DIAGNOSIS — R000 Tachycardia, unspecified: Secondary | ICD-10-CM | POA: Diagnosis not present

## 2021-03-14 DIAGNOSIS — I10 Essential (primary) hypertension: Secondary | ICD-10-CM | POA: Insufficient documentation

## 2021-03-14 DIAGNOSIS — R531 Weakness: Secondary | ICD-10-CM | POA: Diagnosis not present

## 2021-03-14 LAB — CBC
HCT: 48.1 % — ABNORMAL HIGH (ref 36.0–46.0)
Hemoglobin: 16.7 g/dL — ABNORMAL HIGH (ref 12.0–15.0)
MCH: 30.4 pg (ref 26.0–34.0)
MCHC: 34.7 g/dL (ref 30.0–36.0)
MCV: 87.5 fL (ref 80.0–100.0)
Platelets: 278 10*3/uL (ref 150–400)
RBC: 5.5 MIL/uL — ABNORMAL HIGH (ref 3.87–5.11)
RDW: 12.4 % (ref 11.5–15.5)
WBC: 6 10*3/uL (ref 4.0–10.5)
nRBC: 0 % (ref 0.0–0.2)

## 2021-03-14 LAB — COMPREHENSIVE METABOLIC PANEL
ALT: 18 U/L (ref 0–44)
AST: 24 U/L (ref 15–41)
Albumin: 4.6 g/dL (ref 3.5–5.0)
Alkaline Phosphatase: 58 U/L (ref 38–126)
Anion gap: 11 (ref 5–15)
BUN: 14 mg/dL (ref 8–23)
CO2: 26 mmol/L (ref 22–32)
Calcium: 9.5 mg/dL (ref 8.9–10.3)
Chloride: 102 mmol/L (ref 98–111)
Creatinine, Ser: 0.85 mg/dL (ref 0.44–1.00)
GFR, Estimated: >60 mL/min (ref >60)
Glucose, Bld: 121 mg/dL — ABNORMAL HIGH (ref 70–99)
Potassium: 3 mmol/L — ABNORMAL LOW (ref 3.5–5.1)
Sodium: 139 mmol/L (ref 135–145)
Total Bilirubin: 1.4 mg/dL — ABNORMAL HIGH (ref 0.3–1.2)
Total Protein: 7.5 g/dL (ref 6.5–8.1)

## 2021-03-14 LAB — TROPONIN I (HIGH SENSITIVITY): Troponin I (High Sensitivity): 11 ng/L (ref <18)

## 2021-03-14 MED ORDER — DILTIAZEM HCL 25 MG/5ML IV SOLN: 10.0000 mg | Freq: Once | INTRAVENOUS | Status: AC

## 2021-03-14 MED ORDER — DILTIAZEM HCL 25 MG/5ML IV SOLN
10.0000 mg | Freq: Once | INTRAVENOUS | Status: AC
  Administered 2021-03-14: 10 mg via INTRAVENOUS

## 2021-03-14 MED ORDER — DILTIAZEM HCL 25 MG/5ML IV SOLN
INTRAVENOUS | Status: AC
  Administered 2021-03-14: 10 mg via INTRAVENOUS
  Filled 2021-03-14: qty 5

## 2021-03-14 MED ORDER — DILTIAZEM HCL 30 MG PO TABS: ORAL_TABLET | ORAL | 0 refills | Status: DC

## 2021-03-14 MED ORDER — SODIUM CHLORIDE 0.9 % IV BOLUS
1000.0000 mL | Freq: Once | INTRAVENOUS | Status: AC
  Administered 2021-03-14: 1000 mL via INTRAVENOUS

## 2021-03-14 NOTE — ED Triage Notes (Signed)
Patient presents to the ED via EMS from Urgent Care for new onset atrial fibrillation.  Patient reports feeling dizzy this morning and then felt palpitations.  Patient reports a similar episode a few weeks ago.

## 2021-03-14 NOTE — Discharge Instructions (Addendum)
As we discussed if you have any further episodes of heart racing/palpitations please take 1 tablet of your prescribed diltiazem.  If you continue to have palpitations return to the emergency department for evaluation.  Otherwise please follow-up with cardiology.  Call the number provided to arrange a follow-up appointment.

## 2021-03-14 NOTE — ED Triage Notes (Signed)
Patient is being discharged from the Urgent Care and sent to the Emergency Department via EMS . Per Hal Morales NP, patient is in need of higher level of care due to new onset afib with RVR. Patient is aware and verbalizes understanding of plan of care.  Vitals:   03/14/21 1007  BP: 118/88  Pulse: 67  Resp: 16  Temp: 97.7 F (36.5 C)  SpO2: 97%

## 2021-03-14 NOTE — ED Notes (Signed)
Patient noted to be in NSR, repeat EKG performed.

## 2021-03-14 NOTE — Telephone Encounter (Signed)
Pt called with complaints of dizziness and palpitations this morning at 0800; she also had a brief episode of blurred vision; the pt says her symptoms resolved during conversation with this Triage RN; the pt says she had a similar episode 3 weeks ago; she had palpitations 4-5 months previously; recommendations made per nurse triage protocol; she does not want to go to the Ed or Urgent Care for evaluation;  the pt sees Dr Brita Romp, [facility], and would like to be seen in the office; there is no availability today; will route to office for scheduling; the pt can be contacted at [phone]   Reason for Disposition . Extra heart beats OR irregular heart beating  (i.e., "palpitations")  Answer Assessment - Initial Assessment Questions 1. DESCRIPTION: "Describe your dizziness."     Light headed 2. LIGHTHEADED: "Do you feel lightheaded?" (e.g., somewhat faint, woozy, weak upon standing)    Light headed 3. VERTIGO: "Do you feel like either you or the room is spinning or tilting?" (i.e. vertigo)     no 4. SEVERITY: "How bad is it?"  "Do you feel like you are going to faint?" "Can you stand and walk?"   - MILD: Feels slightly dizzy, but walking normally.   - MODERATE: Feels very unsteady when walking, but not falling; interferes with normal activities (e.g., school, work) .   - SEVERE: Unable to walk without falling, or requires assistance to walk without falling; feels like passing out now.      Moderate to severe 5. ONSET:  "When did the dizziness begin?" 0800 3/298/22 6. AGGRAVATING FACTORS: "Does anything make it worse?" (e.g., standing, change in head position)     standing 7. HEART RATE: "Can you tell me your heart rate?" "How many beats in 15 seconds?"  (Note: not all patients can do this)        8. CAUSE: "What do you think is causing the dizziness?"    Not sure, ? dehydration 9. RECURRENT SYMPTOM: "Have you had dizziness before?" If Yes, ask: "When was the last time?" "What  happened that time?"     Yes same symptoms 3 weeks 10. OTHER SYMPTOMS: "Do you have any other symptoms?" (e.g., fever, chest pain, vomiting, diarrhea, bleeding)     Palpitations, blurred vision now resolved 11. PREGNANCY: "Is there any chance you are pregnant?" "When was your last menstrual period?"  no  Protocols used: DIZZINESS [facility]

## 2021-03-14 NOTE — Discharge Instructions (Addendum)
To the ER 

## 2021-03-14 NOTE — ED Provider Notes (Signed)
[facility] Emergency Department Provider Note  Time seen: 11:16 AM  I have reviewed the triage vital signs and the nursing notes.   HISTORY  Chief Complaint Atrial Fibrillation   HPI Alexis Lyons is a 80 y.o. female Lyons a past medical history of gastric Lyons, Alexis Lyons, Alexis Lyons palpitations.  Patient states similar event happened a couple weeks ago but went away on its own.  Patient went to urgent care was found to have new onset atrial fibrillation was at to the emergency department for further evaluation.  Here the patient appears well denies any chest pain or abdominal pain.  Denies any recent illnesses fever cough congestion shortness of breath nausea vomiting diarrhea.  Past Medical History:  Diagnosis Date  . Amnesia   . Aphthae   . Congestion-fibrosis syndrome   . DD (diverticular disease)   . GERD (gastroesophageal Lyons disease)   . Seizures ([place]) 12/2017   passed out due to dehydration    Patient Active Problem List   Diagnosis Date Noted  . Chronic headache 01/01/2019  . Essential Alexis Lyons 07/09/2018  . Osteopenia 11/06/2017  . Seborrheic dermatitis 11/05/2017  . Aphthae 05/28/2015  . DD (diverticular disease) 05/28/2015  . Fatigue 05/28/2015  . Hernia, inguinal, right 05/28/2015  . Amnesia 05/28/2015  . 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 Atrophy of vagina 05/28/2015  . Vaginal lesion 09/21/2008  . Acid Lyons 06/15/2008  . Insomnia 12/17/2004    Past Surgical History:  Procedure Laterality Date  . CHOLECYSTECTOMY    . colonoscopy Lyons polypectomy     8/11 4 mm polyp, 12 mm polyp  . COLONOSCOPY Lyons PROPOFOL N/A 02/24/2018   Procedure: COLONOSCOPY Lyons PROPOFOL;  Surgeon: Manya Silvas, MD;  Location: [facility] ENDOSCOPY;  Service: Endoscopy;   Laterality: N/A;  . HERNIA REPAIR Right 2004   inguinal   . HERNIA REPAIR     x's 3    Prior to Admission medications   Medication Sig Start Date End Date Taking? Authorizing Provider  Ginseng 100 MG CAPS every other day.  06/08/09   [provider]  hydrochlorothiazide (HYDRODIURIL) 25 MG tablet TAKE 1 TABLET BY MOUTH ONCE DAILY 02/22/21   Virginia Crews, MD  ketoconazole (NIZORAL) 2 % shampoo Apply topically. 10/24/20   [provider]  zolpidem (AMBIEN) 5 MG tablet TAKE 1/2 TABLET BY MOUTH AT BEDTIME AS NEEDED SLEEP 10/17/20   Bacigalupo, Dionne Bucy, MD    Allergies  Allergen Reactions  . Codeine     Family History  Problem Relation Age of Onset  . Alzheimer's disease Mother   . Alcohol abuse Father   . Cancer Father        liver cancer  . Cancer Sister        breast  . Breast cancer Sister     Social History Social History   Tobacco Use  . Smoking status: Former Smoker    Packs/day: 0.50    Years: 15.00    Pack years: 7.50    Types: Cigarettes    Quit date: 12/16/1985    Years since quitting: 35.2  . Smokeless tobacco: Never Used  Vaping Use  . Vaping Use: Never used  Substance Use Topics  . Alcohol use: Yes    Alcohol/week: 7.0 standard drinks    Types: 7 Glasses of wine per week  Comment: glass of wine Lyons dinner  . Drug use: No    Review of Systems Constitutional: Negative for fever. Cardiovascular: Negative for chest pain.  Positive for palpitations. Respiratory: Negative for shortness of breath. Gastrointestinal: Negative for abdominal pain, vomiting  Musculoskeletal: Negative for musculoskeletal complaints Neurological: Negative for headache All other ROS negative  ____________________________________________   PHYSICAL EXAM:  VITAL SIGNS: ED Triage Vitals  Enc Vitals Group     BP 03/14/21 1104 91/80     Pulse Rate 03/14/21 1104 (!) 154     Resp 03/14/21 1104 16     Temp 03/14/21 1104 97.6 F (36.4 C)     Temp  Source 03/14/21 1104 Oral     SpO2 03/14/21 1104 95 %     Weight --      Height --      Head Circumference --      Peak Flow --      Pain Score 03/14/21 1105 0     Pain Loc --      Pain Edu? --      Excl. in [place]? --     Constitutional: Alert and oriented. Well appearing and in no distress. Eyes: Normal exam ENT      Head: Normocephalic and atraumatic.      Mouth/Throat: Mucous membranes are moist. Cardiovascular: Irregular rhythm rate around 150 bpm.  No obvious murmur. Respiratory: Normal respiratory effort without tachypnea nor retractions. Breath sounds are clear Gastrointestinal: Soft and nontender. No distention.  Musculoskeletal: Nontender Lyons normal range of motion in all extremities. No lower extremity tenderness or edema. Neurologic:  Normal speech and language. No gross focal neurologic deficits  Skin:  Skin is warm, dry and intact.  Psychiatric: Mood and affect are normal.   ____________________________________________    EKG  EKG shows atrial fibrillation at 95 bpm Lyons a narrow QRS, normal axis, normal intervals, nonspecific ST changes.  EKG viewed and interpreted by myself shows a sinus rhythm at 60 bpm Lyons a narrow QRS, normal axis, normal intervals, nonspecific but no concerning ST changes.  ____________________________________________   INITIAL IMPRESSION / ASSESSMENT AND PLAN / ED COURSE  Pertinent labs & imaging results that were available during my care of the patient were reviewed by me and considered in my medical decision making (see chart for details).   Patient Alexis emergency department for palpitation and dizziness found to have new onset atrial fibrillation at the urgent care and sent to the emergency department for further evaluation.  Here the patient appears well denies any chest pain.  Largely negative review of systems.  Patient does appear to be in atrial fibrillation around 150 bpm.  We will check labs, IV hydrate and dose IV diltiazem.   We will continue to closely monitor while awaiting lab results.  Patient has received 2 doses of IV diltiazem.  Patient's heart rate is now in the 50s, repeat EKG confirms normal sinus rhythm.  We will discharge the patient Lyons diltiazem short acting to be used if she reverts to A. fib/palpitations.  I will refer to Dr. Rockey Situ per patient request.  Discussed return precautions.  Patient agreeable to plan of care.  BLESSYN SOMMERVILLE was evaluated in Emergency Department on 03/14/2021 for the symptoms described in the history of present illness. She was evaluated in the context of the global COVID-19 pandemic, which necessitated consideration that the patient might be at risk for infection Lyons the SARS-CoV-2 virus that causes COVID-19. Institutional protocols and algorithms that pertain  to the evaluation of patients at risk for COVID-19 are in a state of rapid change based on information released by regulatory bodies including the CDC and federal and state organizations. These policies and algorithms were followed during the patient's care in the ED.  CRITICAL CARE Performed by: Harvest Dark   Total critical care time: 30 minutes  Critical care time was exclusive of separately billable procedures and treating other patients.  Critical care was necessary to treat or prevent imminent or life-threatening deterioration.  Critical care was time spent personally by me on the following activities: development of treatment plan Lyons patient and/or surrogate as well as nursing, discussions Lyons consultants, evaluation of patient's response to treatment, examination of patient, obtaining history from patient or surrogate, ordering and performing treatments and interventions, ordering and review of laboratory studies, ordering and review of radiographic studies, pulse oximetry and re-evaluation of patient's condition.  ____________________________________________   FINAL CLINICAL IMPRESSION(S) / ED  DIAGNOSES  Atrial fibrillation Lyons rapid ventricular response New onset atrial fibrillation   Harvest Dark, MD 03/14/21 1410

## 2021-03-14 NOTE — ED Notes (Signed)
ED Provider at bedside. 

## 2021-03-14 NOTE — Telephone Encounter (Signed)
I am completely booked. Can see if anyone else has anything available today or tomorrow.

## 2021-03-14 NOTE — ED Triage Notes (Signed)
EKG shows afib with RVR, rate 140s+. Pt reports feeling of fluttering and dizziness during EKG. Provider notified and to bedside.

## 2021-03-14 NOTE — ED Triage Notes (Signed)
Pt presents with a feeling of palpitations in her chest this morning after waking.  Accompanied by dizziness.  No longer experiencing s/s but states she had same sensations a few weeks ago and it lasted all day. Denies any CP, dizziness, SOB at this time.

## 2021-03-14 NOTE — ED Provider Notes (Signed)
Alexis Lyons    CSN: [id number] Arrival date & time: 03/14/21  7322      History   Chief Complaint Chief Complaint  Patient presents with  . Palpitations    HPI Alexis Lyons is a 80 y.o. female.   Patient is a 80 year old female who presents today with complaints of palpitations, dizziness.  This started approximately 3 weeks ago.  First episode lasted pretty much all day and then subsided.  She has had an intermittent since then but did have dizziness and palpitations this morning.  Denies any chest pain, shortness of breath associated at this time.   Palpitations   Past Medical History:  Diagnosis Date  . Amnesia   . Aphthae   . Congestion-fibrosis syndrome   . DD (diverticular disease)   . GERD (gastroesophageal reflux disease)   . Seizures ([place]) 12/2017   passed out due to dehydration    Patient Active Problem List   Diagnosis Date Noted  . Chronic headache 01/01/2019  . Essential hypertension 07/09/2018  . Osteopenia 11/06/2017  . Seborrheic dermatitis 11/05/2017  . Aphthae 05/28/2015  . DD (diverticular disease) 05/28/2015  . Fatigue 05/28/2015  . Hernia, inguinal, right 05/28/2015  . Amnesia 05/28/2015  . 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 Atrophy of vagina 05/28/2015  . Vaginal lesion 09/21/2008  . Acid reflux 06/15/2008  . Insomnia 12/17/2004    Past Surgical History:  Procedure Laterality Date  . CHOLECYSTECTOMY    . colonoscopy with polypectomy     8/11 4 mm polyp, 12 mm polyp  . COLONOSCOPY WITH PROPOFOL N/A 02/24/2018   Procedure: COLONOSCOPY WITH PROPOFOL;  Surgeon: Manya Silvas, MD;  Location: [facility] ENDOSCOPY;  Service: Endoscopy;  Laterality: N/A;  . HERNIA REPAIR Right 2004   inguinal   . HERNIA REPAIR     x's 3    OB History    Gravida  2   Para  2   Term      Preterm      AB      Living        SAB      IAB      Ectopic      Multiple      Live Births               Home  Medications    Prior to Admission medications   Medication Sig Start Date End Date Taking? Authorizing Provider  Ginseng 100 MG CAPS every other day.  06/08/09  Yes [provider]  hydrochlorothiazide (HYDRODIURIL) 25 MG tablet TAKE 1 TABLET BY MOUTH ONCE DAILY 02/22/21  Yes Bacigalupo, Dionne Bucy, MD  ketoconazole (NIZORAL) 2 % shampoo Apply topically. 10/24/20  Yes [provider]  zolpidem (AMBIEN) 5 MG tablet TAKE 1/2 TABLET BY MOUTH AT BEDTIME AS NEEDED SLEEP 10/17/20  Yes Bacigalupo, Dionne Bucy, MD    Family History Family History  Problem Relation Age of Onset  . Alzheimer's disease Mother   . Alcohol abuse Father   . Cancer Father        liver cancer  . Cancer Sister        breast  . Breast cancer Sister     Social History Social History   Tobacco Use  . Smoking status: Former Smoker    Packs/day: 0.50    Years: 15.00    Pack years: 7.50    Types: Cigarettes    Quit date: 12/16/1985    Years since quitting: 35.2  .  Smokeless tobacco: Never Used  Vaping Use  . Vaping Use: Never used  Substance Use Topics  . Alcohol use: Yes    Alcohol/week: 7.0 standard drinks    Types: 7 Glasses of wine per week    Comment: glass of wine with dinner  . Drug use: No     Allergies   Codeine   Review of Systems Review of Systems  Cardiovascular: Positive for palpitations.     Physical Exam Triage Vital Signs ED Triage Vitals  Enc Vitals Group     BP 03/14/21 1007 118/88     Pulse Rate 03/14/21 1007 67     Resp 03/14/21 1007 16     Temp 03/14/21 1007 97.7 F (36.5 C)     Temp Source 03/14/21 1007 Oral     SpO2 03/14/21 1007 97 %     Weight --      Height --      Head Circumference --      Peak Flow --      Pain Score 03/14/21 1009 0     Pain Loc --      Pain Edu? --      Excl. in [place]? --    No data found.  Updated Vital Signs BP 118/88 (BP Location: Right Arm)   Pulse 67   Temp 97.7 F (36.5 C) (Oral)   Resp 16   SpO2 97%   Visual  Acuity Right Eye Distance:   Left Eye Distance:   Bilateral Distance:    Right Eye Near:   Left Eye Near:    Bilateral Near:     Physical Exam Vitals and nursing note reviewed.  Constitutional:      General: She is not in acute distress.    Appearance: Normal appearance. She is not ill-appearing, toxic-appearing or diaphoretic.  HENT:     Head: Normocephalic.     Nose: Nose normal.     Mouth/Throat:     Pharynx: Oropharynx is clear.  Eyes:     Conjunctiva/sclera: Conjunctivae normal.  Cardiovascular:     Rate and Rhythm: Rhythm irregular.     Heart sounds: Normal heart sounds.  Pulmonary:     Effort: Pulmonary effort is normal.     Breath sounds: Normal breath sounds.  Musculoskeletal:        General: Normal range of motion.     Cervical back: Normal range of motion.  Skin:    General: Skin is warm and dry.     Findings: No rash.  Neurological:     Mental Status: She is alert.  Psychiatric:        Mood and Affect: Mood normal.      UC Treatments / Results  Labs (all labs ordered are listed, but only abnormal results are displayed) Labs Reviewed - No data to display  EKG   Radiology No results found.  Procedures Procedures (including critical care time)  Medications Ordered in UC Medications - No data to display  Initial Impression / Assessment and Plan / UC Course  I have reviewed the triage vital signs and the nursing notes.  Pertinent labs & imaging results that were available during my care of the patient were reviewed by me and considered in my medical decision making (see chart for details).     A. fib RVR-patient with new onset A. fib RVR with rates into the 160s to 170s. No current chest pain or shortness of breath. Will cough EMS for transport to the  ER for further evaluation.  She is otherwise stable and vitals stable.  Final Clinical Impressions(s) / UC Diagnoses   Final diagnoses:  Atrial fibrillation with RVR [facility])     Discharge  Instructions     To the ER     ED Prescriptions    None     PDMP not reviewed this encounter.   Loura Halt A, NP 03/14/21 1029

## 2021-03-15 NOTE — Telephone Encounter (Signed)
Patient was seen at the ER and was treated for A-fib. Patient does have F/U with Cardio 03/24/21.

## 2021-03-17 ENCOUNTER — Emergency Department: Payer: Medicare HMO

## 2021-03-17 ENCOUNTER — Other Ambulatory Visit: Payer: Self-pay

## 2021-03-17 ENCOUNTER — Ambulatory Visit: Payer: Self-pay | Admitting: *Deleted

## 2021-03-17 ENCOUNTER — Encounter: Payer: Self-pay | Admitting: *Deleted

## 2021-03-17 ENCOUNTER — Emergency Department
Admission: EM | Admit: 2021-03-17 | Discharge: 2021-03-18 | Disposition: A | Discharge status: Home / Self Care | Payer: Medicare HMO | Attending: Emergency Medicine | Admitting: Emergency Medicine

## 2021-03-17 DIAGNOSIS — I10 Essential (primary) hypertension: Secondary | ICD-10-CM | POA: Diagnosis not present

## 2021-03-17 DIAGNOSIS — K219 Gastro-esophageal reflux disease without esophagitis: Secondary | ICD-10-CM

## 2021-03-17 DIAGNOSIS — R0789 Other chest pain: Secondary | ICD-10-CM

## 2021-03-17 DIAGNOSIS — Z79899 Other long term (current) drug therapy: Secondary | ICD-10-CM | POA: Insufficient documentation

## 2021-03-17 DIAGNOSIS — Z87891 Personal history of nicotine dependence: Secondary | ICD-10-CM | POA: Diagnosis not present

## 2021-03-17 DIAGNOSIS — J449 Chronic obstructive pulmonary disease, unspecified: Secondary | ICD-10-CM | POA: Diagnosis not present

## 2021-03-17 DIAGNOSIS — R079 Chest pain, unspecified: Secondary | ICD-10-CM | POA: Diagnosis not present

## 2021-03-17 DIAGNOSIS — J9811 Atelectasis: Secondary | ICD-10-CM | POA: Diagnosis not present

## 2021-03-17 LAB — BASIC METABOLIC PANEL
Anion gap: 9 (ref 5–15)
BUN: 18 mg/dL (ref 8–23)
CO2: 30 mmol/L (ref 22–32)
Calcium: 9.7 mg/dL (ref 8.9–10.3)
Chloride: 101 mmol/L (ref 98–111)
Creatinine, Ser: 0.88 mg/dL (ref 0.44–1.00)
GFR, Estimated: >60 mL/min (ref >60)
Glucose, Bld: 117 mg/dL — ABNORMAL HIGH (ref 70–99)
Potassium: 3.4 mmol/L — ABNORMAL LOW (ref 3.5–5.1)
Sodium: 140 mmol/L (ref 135–145)

## 2021-03-17 LAB — CBC
HCT: 43.6 % (ref 36.0–46.0)
Hemoglobin: 14.8 g/dL (ref 12.0–15.0)
MCH: 29.9 pg (ref 26.0–34.0)
MCHC: 33.9 g/dL (ref 30.0–36.0)
MCV: 88.1 fL (ref 80.0–100.0)
Platelets: 263 10*3/uL (ref 150–400)
RBC: 4.95 MIL/uL (ref 3.87–5.11)
RDW: 12.4 % (ref 11.5–15.5)
WBC: 6.2 10*3/uL (ref 4.0–10.5)
nRBC: 0 % (ref 0.0–0.2)

## 2021-03-17 LAB — TROPONIN I (HIGH SENSITIVITY): Troponin I (High Sensitivity): 5 ng/L (ref <18)

## 2021-03-17 NOTE — ED Triage Notes (Signed)
Per patient's report, patient c/o left-side chest pain that radiated to left shoulder and back. Patient states pain began at 1530 today. Patient c/o feeling tired at the time, but denies any other symptoms. Patient took (1) tab of Diltiazem 30mg  and felt that the chest pain resolved, but then came back.

## 2021-03-17 NOTE — Telephone Encounter (Signed)
Patient c/o chest soreness and ache like pressure in chest radiating to back between shoulders and constant. Reports she has not felt this kind of pain before. Chest pain started approx. Hour and 1/2 ago . Denies dizziness, lightheadedness, sweating. Patient reports taking a diltiazem at 4:30 pm prescription from recent ED visit due to A-fib. Patient denies heart racing or irregularity. Patient reports she does not want to go to ED and sit and they not see her. Reviewed her symptoms and instructed patient to get assessed in ED. Care advise given. Patient verbalized understanding of care advise and to go to ED and call 911 if symptoms worsen.  Reason for Disposition . [1] Chest pain (or "angina") comes and goes AND [2] is happening more often (increasing in frequency) or getting worse (increasing in severity) (Exception: chest pains that last only a few seconds)  Answer Assessment - Initial Assessment Questions 1. LOCATION: "Where does it hurt?"       Chest and back between shoulders 2. RADIATION: "Does the pain go anywhere else?" (e.g., into neck, jaw, arms, back)     Radiates from inside chest to back  3. ONSET: "When did the chest pain begin?" (Minutes, hours or days)      Hour and 1/2 ago  4. PATTERN "Does the pain come and go, or has it been constant since it started?"  "Does it get worse with exertion?"      Constant now  5. DURATION: "How long does it last" (e.g., seconds, minutes, hours)     constant 6. SEVERITY: "How bad is the pain?"  (e.g., Scale 1-10; mild, moderate, or severe)    - MILD (1-3): doesn't interfere with normal activities     - MODERATE (4-7): interferes with normal activities or awakens from sleep    - SEVERE (8-10): excruciating pain, unable to do any normal activities       Getting worse 7. CARDIAC RISK FACTORS: "Do you have any history of heart problems or risk factors for heart disease?" (e.g., angina, prior heart attack; diabetes, high blood pressure, high  cholesterol, smoker, or strong family history of heart disease)     Hx A-fib recent diagnosis 8. PULMONARY RISK FACTORS: "Do you have any history of lung disease?"  (e.g., blood clots in lung, asthma, emphysema, birth control pills)     na 9. CAUSE: "What do you think is causing the chest pain?"     unknown 10. OTHER SYMPTOMS: "Do you have any other symptoms?" (e.g., dizziness, nausea, vomiting, sweating, fever, difficulty breathing, cough)       Feels extremely tired 11. PREGNANCY: "Is there any chance you are pregnant?" "When was your last menstrual period?"       na  Protocols used: CHEST PAIN-A-AH

## 2021-03-18 ENCOUNTER — Encounter: Payer: Self-pay | Admitting: Radiology

## 2021-03-18 ENCOUNTER — Emergency Department: Payer: Medicare HMO

## 2021-03-18 DIAGNOSIS — R079 Chest pain, unspecified: Secondary | ICD-10-CM | POA: Diagnosis not present

## 2021-03-18 DIAGNOSIS — J9811 Atelectasis: Secondary | ICD-10-CM | POA: Diagnosis not present

## 2021-03-18 LAB — TROPONIN I (HIGH SENSITIVITY): Troponin I (High Sensitivity): 5 ng/L (ref <18)

## 2021-03-18 MED ORDER — FAMOTIDINE IN NACL 20-0.9 MG/50ML-% IV SOLN
20.0000 mg | Freq: Once | INTRAVENOUS | Status: AC
  Administered 2021-03-18: 20 mg via INTRAVENOUS
  Filled 2021-03-18: qty 50

## 2021-03-18 MED ORDER — IOHEXOL 350 MG/ML SOLN
100.0000 mL | Freq: Once | INTRAVENOUS | Status: AC | PRN
  Administered 2021-03-18: 100 mL via INTRAVENOUS

## 2021-03-18 MED ORDER — ALUM & MAG HYDROXIDE-SIMETH 200-200-20 MG/5ML PO SUSP
30.0000 mL | Freq: Once | ORAL | Status: AC
  Administered 2021-03-18: 30 mL via ORAL
  Filled 2021-03-18: qty 30

## 2021-03-18 MED ORDER — PANTOPRAZOLE SODIUM 40 MG PO TBEC: 40.0000 mg | DELAYED_RELEASE_TABLET | Freq: Every day | ORAL | 1 refills | Status: DC

## 2021-03-18 NOTE — ED Provider Notes (Signed)
[facility] Emergency Department Provider Note  ____________________________________________  Time seen: Approximately 12:17 AM  I have reviewed the triage vital signs and the nursing notes.   HISTORY  Chief Complaint Chest Pain   HPI Alexis Lyons is a 80 y.o. female with a history of GERD and diverticular disease who presents for evaluation of chest pain.  Patient reports that she was in her usual state of health driving back from [place] around 3:30 PM when she developed sudden onset of left-sided chest pain that she describes as sharp, going straight to her back.  The pain initially was severe.  Patient was recently diagnosed with paroxysmal A. fib and given Cardizem and told to take it as needed if she went into A. fib again.  She reports that when she got home she took 1 Cardizem and that helped with the pain for several hours but the pain recurred around 8 PM.  At this time she reports that the sharp pain has resolved but she feels a dull achy pain that is mild located on the left side of her chest.  She denies nausea, vomiting, shortness of breath, dizziness, abdominal pain, constipation, diarrhea.  Denies any personal or family history of PE or DVT, recent travel immobilization, leg pain or swelling, hemoptysis, or exogenous hormones.   Past Medical History:  Diagnosis Date  . Amnesia   . Aphthae   . Congestion-fibrosis syndrome   . DD (diverticular disease)   . GERD (gastroesophageal reflux disease)   . Seizures ([place]) 12/2017   passed out due to dehydration    Patient Active Problem List   Diagnosis Date Noted  . Chronic headache 01/01/2019  . Essential hypertension 07/09/2018  . Osteopenia 11/06/2017  . Seborrheic dermatitis 11/05/2017  . Aphthae 05/28/2015  . DD (diverticular disease) 05/28/2015  . Fatigue 05/28/2015  . Hernia, inguinal, right 05/28/2015  . Amnesia 05/28/2015  . 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 Atrophy of  vagina 05/28/2015  . Vaginal lesion 09/21/2008  . Acid reflux 06/15/2008  . Insomnia 12/17/2004    Past Surgical History:  Procedure Laterality Date  . CHOLECYSTECTOMY    . colonoscopy with polypectomy     8/11 4 mm polyp, 12 mm polyp  . COLONOSCOPY WITH PROPOFOL N/A 02/24/2018   Procedure: COLONOSCOPY WITH PROPOFOL;  Surgeon: Manya Silvas, MD;  Location: [facility] ENDOSCOPY;  Service: Endoscopy;  Laterality: N/A;  . HERNIA REPAIR Right 2004   inguinal   . HERNIA REPAIR     x's 3    Prior to Admission medications   Medication Sig Start Date End Date Taking? Authorizing Provider  diltiazem (CARDIZEM) 30 MG tablet Take 1 tablet daily as needed for palpitations/heart racing. 03/14/21   Harvest Dark, MD  Ginseng 100 MG CAPS every other day.  06/08/09   [provider]  hydrochlorothiazide (HYDRODIURIL) 25 MG tablet TAKE 1 TABLET BY MOUTH ONCE DAILY 02/22/21   Virginia Crews, MD  ketoconazole (NIZORAL) 2 % shampoo Apply topically. 10/24/20   [provider]  zolpidem (AMBIEN) 5 MG tablet TAKE 1/2 TABLET BY MOUTH AT BEDTIME AS NEEDED SLEEP 10/17/20   Brita Romp Dionne Bucy, MD    Allergies Codeine  Family History  Problem Relation Age of Onset  . Alzheimer's disease Mother   . Alcohol abuse Father   . Cancer Father        liver cancer  . Cancer Sister        breast  . Breast cancer Sister  Social History Social History   Tobacco Use  . Smoking status: Former Smoker    Packs/day: 0.50    Years: 15.00    Pack years: 7.50    Types: Cigarettes    Quit date: 12/16/1985    Years since quitting: 35.2  . Smokeless tobacco: Never Used  Vaping Use  . Vaping Use: Never used  Substance Use Topics  . Alcohol use: Yes    Alcohol/week: 7.0 standard drinks    Types: 7 Glasses of wine per week    Comment: glass of wine with dinner  . Drug use: No    Review of Systems  Constitutional: Negative for fever. Eyes: Negative for visual changes. ENT:  Negative for sore throat. Neck: No neck pain  Cardiovascular: + chest pain. Respiratory: Negative for shortness of breath. Gastrointestinal: Negative for abdominal pain, vomiting or diarrhea. Genitourinary: Negative for dysuria. Musculoskeletal: Negative for back pain. Skin: Negative for rash. Neurological: Negative for headaches, weakness or numbness. Psych: No SI or HI  ____________________________________________   PHYSICAL EXAM:  VITAL SIGNS: ED Triage Vitals  Enc Vitals Group     BP 03/17/21 2044 (!) 162/88     Pulse Rate 03/17/21 2044 66     Resp 03/17/21 2044 17     Temp 03/17/21 2044 98.2 F (36.8 C)     Temp Source 03/17/21 2044 Oral     SpO2 03/17/21 2044 96 %     Weight 03/17/21 2043 117 lb (53.1 kg)     Height 03/17/21 2043 5\' 3"  (1.6 m)     Head Circumference --      Peak Flow --      Pain Score 03/17/21 2057 4     Pain Loc --      Pain Edu? --      Excl. in [place]? --     Constitutional: Alert and oriented. Well appearing and in no apparent distress. HEENT:      Head: Normocephalic and atraumatic.         Eyes: Conjunctivae are normal. Sclera is non-icteric.       Mouth/Throat: Mucous membranes are moist.       Neck: Supple with no signs of meningismus. Cardiovascular: Regular rate and rhythm. No murmurs, gallops, or rubs. 2+ symmetrical distal pulses are present in all extremities. No JVD. Respiratory: Normal respiratory effort. Lungs are clear to auscultation bilaterally.  Gastrointestinal: Soft, non tender, and non distended. Musculoskeletal:  No edema, cyanosis, or erythema of extremities. Neurologic: Normal speech and language. Face is symmetric. Moving all extremities. No gross focal neurologic deficits are appreciated. Skin: Skin is warm, dry and intact. No rash noted. Psychiatric: Mood and affect are normal. Speech and behavior are normal.  ____________________________________________   LABS (all labs ordered are listed, but only abnormal results  are displayed)  Labs Reviewed  BASIC METABOLIC PANEL - Abnormal; Notable for the following components:      Result Value   Potassium 3.4 (*)    Glucose, Bld 117 (*)    All other components within normal limits  CBC  TROPONIN I (HIGH SENSITIVITY)  TROPONIN I (HIGH SENSITIVITY)   ____________________________________________  EKG  ED ECG REPORT I, Rudene Re, the attending physician, personally viewed and interpreted this ECG.  Normal sinus rhythm, rate of 67, normal intervals, normal axis, inferior and anterior Q waves no ST elevations or depressions.  No significant changes when compared to prior from March 2022. ____________________________________________  RADIOLOGY  I have personally reviewed the images  performed during this visit and I agree with the Radiologist's read.   Interpretation by Radiologist:  DG Chest 2 View  Result Date: 03/17/2021 CLINICAL DATA:  Chest pain beginning today. EXAM: CHEST - 2 VIEW COMPARISON:  None. FINDINGS: Heart size normal. Changes of COPD noted. No edema or effusion is present. No focal airspace disease present. Degenerative changes are present within the thoracic spine. IMPRESSION: 1. COPD. 2. No acute cardiopulmonary disease. Electronically Signed   By: San Morelle M.D.   On: 03/17/2021 21:32   CT ANGIO CHEST AORTA W/CM & OR WO/CM  Result Date: 03/18/2021 CLINICAL DATA:  Left-sided chest pain radiating to the shoulder EXAM: CT ANGIOGRAPHY CHEST WITH CONTRAST TECHNIQUE: Multidetector CT imaging of the chest was performed using the standard protocol during bolus administration of intravenous contrast. Multiplanar CT image reconstructions and MIPs were obtained to evaluate the vascular anatomy. CONTRAST:  116mL OMNIPAQUE IOHEXOL 350 MG/ML SOLN COMPARISON:  None. FINDINGS: Cardiovascular: Calcific aortic atherosclerosis. No intramural hematoma. No aortic aneurysm. Heart size is normal. No pericardial effusion. There is no aortic  dissection or penetrating ulcer. Central pulmonary arteries are normal. Mediastinum/Nodes: No adenopathy. Normal thyroid. Normal esophageal course. Lungs/Pleura: Mild basilar atelectasis.  No pleural effusion. Upper Abdomen: No acute abnormality. Musculoskeletal: No chest wall abnormality. No acute or significant osseous findings. Review of the MIP images confirms the above findings. IMPRESSION: 1. No acute aortic syndrome or other acute thoracic abnormality. Aortic Atherosclerosis (ICD10-I70.0). Electronically Signed   By: Ulyses Jarred M.D.   On: 03/18/2021 01:04     ____________________________________________   PROCEDURES  Procedure(s) performed:yes .1-3 Lead EKG Interpretation Performed by: Rudene Re, MD Authorized by: Rudene Re, MD     Interpretation: non-specific     ECG rate assessment: normal     Rhythm: sinus rhythm     Ectopy: none     Conduction: normal     Critical Care performed: yes  CRITICAL CARE Performed by: Rudene Re  ?  Total critical care time: 30 min  Critical care time was exclusive of separately billable procedures and treating other patients.  Critical care was necessary to treat or prevent imminent or life-threatening deterioration.  Critical care was time spent personally by me on the following activities: development of treatment plan with patient and/or surrogate as well as nursing, discussions with consultants, evaluation of patient's response to treatment, examination of patient, obtaining history from patient or surrogate, ordering and performing treatments and interventions, ordering and review of laboratory studies, ordering and review of radiographic studies, pulse oximetry and re-evaluation of patient's condition.  ____________________________________________   INITIAL IMPRESSION / ASSESSMENT AND PLAN / ED COURSE  80 y.o. female with a history of GERD and diverticular disease who presents for evaluation of sharp  stabbing chest pain radiating to her back since 3PM.  Patient is extremely well-appearing in no distress with normal vital signs other than mildly elevated BP, normal neurological exam, no abdominal tenderness, no murmurs, strong equal pulses in all 4 extremities, no asymmetric leg swelling or pitting edema.  EKG with no signs of ischemia or dysrhythmias.  Ddx Gerd vs dissection vs PE vs edema vs PNA vs MSK. Atypical for ACS especially with non ischemic EKG and 2 HS-trop neg. CTA of the chest visualized by me with no signs of PE or dissection, confirmed by radiology.  Labs with no significant electrolyte derangements, no leukocytosis, no anemia, no dehydration.  Most likely GERD.  Will treat with IV pepcid and maalox and reassess for discharge.  Old medical records reviewed.  Patient placed on telemetry for monitoring of cardiorespiratory status   _________________________ 1:38 AM on 03/18/2021 -----------------------------------------  After dose of IV Pepcid patient's pain fully resolved.  Will discharge home on Protonix and Maalox and follow-up with PCP.  Discussed my standard return precautions.     _____________________________________________ Please note:  Patient was evaluated in Emergency Department today for the symptoms described in the history of present illness. Patient was evaluated in the context of the global COVID-19 pandemic, which necessitated consideration that the patient might be at risk for infection with the SARS-CoV-2 virus that causes COVID-19. Institutional protocols and algorithms that pertain to the evaluation of patients at risk for COVID-19 are in a state of rapid change based on information released by regulatory bodies including the CDC and federal and state organizations. These policies and algorithms were followed during the patient's care in the ED.  Some ED evaluations and interventions may be delayed as a result of limited staffing during the pandemic.   [place]  Controlled Substance Database was reviewed by me. ____________________________________________   FINAL CLINICAL IMPRESSION(S) / ED DIAGNOSES   Final diagnoses:  Atypical chest pain  Gastroesophageal reflux disease without esophagitis      NEW MEDICATIONS STARTED DURING THIS VISIT:  ED Discharge Orders    None       Note:  This document was prepared using Dragon voice recognition software and may include unintentional dictation errors.    Alfred Levins, [place], MD 03/18/21 [phone]

## 2021-03-18 NOTE — ED Notes (Signed)
Patient instructed on Maalox and use at home as well as how to get it OTC. The patient was provided discharge instructions, including scripts. Patient denies needs, questions, or concerns.

## 2021-03-18 NOTE — ED Notes (Signed)
Patient ambulatory to exit with steady gait. NADN.

## 2021-03-20 NOTE — Telephone Encounter (Signed)
FYI

## 2021-03-24 ENCOUNTER — Other Ambulatory Visit: Payer: Self-pay

## 2021-03-24 ENCOUNTER — Ambulatory Visit: Payer: Medicare HMO | Admitting: Cardiology

## 2021-03-24 ENCOUNTER — Encounter: Payer: Self-pay | Admitting: Cardiology

## 2021-03-24 VITALS — BP 160/88 | HR 69 | Ht 63.0 in | Wt 120.0 lb

## 2021-03-24 DIAGNOSIS — I10 Essential (primary) hypertension: Secondary | ICD-10-CM

## 2021-03-24 DIAGNOSIS — I480 Paroxysmal atrial fibrillation: Secondary | ICD-10-CM | POA: Diagnosis not present

## 2021-03-24 MED ORDER — DILTIAZEM HCL ER COATED BEADS 120 MG PO CP24: 120.0000 mg | ORAL_CAPSULE | Freq: Every day | ORAL | 3 refills | Status: DC

## 2021-03-24 MED ORDER — APIXABAN 5 MG PO TABS: 5.0000 mg | ORAL_TABLET | Freq: Two times a day (BID) | ORAL | 3 refills | Status: DC

## 2021-03-24 NOTE — Patient Instructions (Signed)
Medication Instructions:  Your physician has recommended you make the following change in your medication:   1.  STOP taking your Diltiazem (Cardizem).  2.  START taking Diltiazem (Cardizem CD) once daily.  3.  START taking Eliquis 5 MG twice a day.  *If you need a refill on your cardiac medications before your next appointment, please call your pharmacy*   Lab Work: None ordered If you have labs (blood work) drawn today and your tests are completely normal, you will receive your results only by: Marland Kitchen MyChart Message (if you have MyChart) OR . A paper copy in the mail If you have any lab test that is abnormal or we need to change your treatment, we will call you to review the results.   Testing/Procedures:  Your physician has requested that you have an echocardiogram. Echocardiography is a painless test that uses sound waves to create images of your heart. It provides your doctor with information about the size and shape of your heart and how well your heart's chambers and valves are working. This procedure takes approximately one hour. There are no restrictions for this procedure.    Follow-Up: At [facility], you and your health needs are our priority.  As part of our continuing mission to provide you with exceptional heart care, we have created designated Provider Care Teams.  These Care Teams include your primary Cardiologist (physician) and Advanced Practice Providers (APPs -  Physician Assistants and Nurse Practitioners) who all work together to provide you with the care you need, when you need it.  We recommend signing up for the patient portal called "MyChart".  Sign up information is provided on this After Visit Summary.  MyChart is used to connect with patients for Virtual Visits (Telemedicine).  Patients are able to view lab/test results, encounter notes, upcoming appointments, etc.  Non-urgent messages can be sent to your provider as well.   To learn more about what you can  do with MyChart, go to [email or web address].    Your next appointment:   Follow up after Echo   The format for your next appointment:   In Person  Provider:   Kate Sable, MD   Other Instructions

## 2021-03-24 NOTE — Progress Notes (Signed)
Cardiology Office Note:    Date:  03/24/2021   ID:  Alexis Lyons, DOB [birth year], MRN [id number]  PCP:  Alexis Crews, MD   [place]  Cardiologist:  Kate Sable, MD  Advanced Practice Provider:  No care team member to display Electrophysiologist:  None       Referring MD: Alexis Crews, MD   Chief Complaint  Patient presents with  . New Patient (Initial Visit)    Hospital follow up - Afib. Meds reviewed verbally with patient.     History of Present Illness:    Alexis Lyons is a 80 y.o. female with a hx of hypertension, who presents due to atrial fibrillation.    Prior episodes of dizziness, palpitation about 10 days ago, presented at an urgent care and found to have atrial fibrillation.  Was advised to go to the emergency room.  EKG in the ED confirmed atrial fibrillation with RVR, heart rate 142, received IV diltiazem with conversion to sinus rhythm/sinus bradycardia.  She states having similar episodes about a year ago.  Symptoms comprised of palpitations, dizziness.  She feels well otherwise when not having palpitations.  Denies any history of heart disease.  She recently retired from being a Technical sales engineer.   Past Medical History:  Diagnosis Date  . Amnesia   . Aphthae   . Congestion-fibrosis syndrome   . DD (diverticular disease)   . GERD (gastroesophageal reflux disease)   . Seizures ([place]) 12/2017   passed out due to dehydration    Past Surgical History:  Procedure Laterality Date  . CHOLECYSTECTOMY    . colonoscopy with polypectomy     8/11 4 mm polyp, 12 mm polyp  . COLONOSCOPY WITH PROPOFOL N/A 02/24/2018   Procedure: COLONOSCOPY WITH PROPOFOL;  Surgeon: Manya Silvas, MD;  Location: [facility] ENDOSCOPY;  Service: Endoscopy;  Laterality: N/A;  . HERNIA REPAIR Right 2004   inguinal   . HERNIA REPAIR     x's 3    Current Medications: Current Meds  Medication Sig  . apixaban (ELIQUIS) 5 MG TABS  tablet Take 1 tablet (5 mg total) by mouth 2 (two) times daily.  Marland Kitchen diltiazem (CARDIZEM CD) 120 MG 24 hr capsule Take 1 capsule (120 mg total) by mouth daily.  . Ginseng 100 MG CAPS every other day.   . hydrochlorothiazide (HYDRODIURIL) 25 MG tablet TAKE 1 TABLET BY MOUTH ONCE DAILY  . ketoconazole (NIZORAL) 2 % shampoo Apply topically.  . pantoprazole (PROTONIX) 40 MG tablet Take 1 tablet (40 mg total) by mouth daily.  Marland Kitchen zolpidem (AMBIEN) 5 MG tablet TAKE 1/2 TABLET BY MOUTH AT BEDTIME AS NEEDED SLEEP  . [DISCONTINUED] diltiazem (CARDIZEM) 30 MG tablet Take 1 tablet daily as needed for palpitations/heart racing.     Allergies:   Codeine   Social History   Socioeconomic History  . Marital status: Divorced    Spouse name: Not on file  . Number of children: 2  . Years of education: 48  . Highest education level: Not on file  Occupational History  . Occupation: [employer] in [place] Use  . Smoking status: Former Smoker    Packs/day: 0.50    Years: 15.00    Pack years: 7.50    Types: Cigarettes    Quit date: 12/16/1985    Years since quitting: 35.2  . Smokeless tobacco: Never Used  Vaping Use  . Vaping Use: Never used  Substance and Sexual Activity  .  Alcohol use: Yes    Alcohol/week: 7.0 standard drinks    Types: 7 Glasses of wine per week    Comment: glass of wine with dinner  . Drug use: No  . Sexual activity: Not Currently  Other Topics Concern  . Not on file  Social History Narrative  . Not on file   Social Determinants of Health   Financial Resource Strain: Not on file  Food Insecurity: Not on file  Transportation Needs: Not on file  Physical Activity: Not on file  Stress: Not on file  Social Connections: Not on file     Family History: The patient's family history includes Alcohol abuse in her father; Alzheimer's disease in her mother; Breast cancer in her sister; Cancer in her father and sister.  ROS:   Please see the history of present illness.      All other systems reviewed and are negative.  EKGs/Labs/Other Studies Reviewed:    The following studies were reviewed today:   EKG:  EKG is  ordered today.  The ekg ordered today demonstrates sinus rhythm, heart rate 69  Recent Labs: 03/14/2021: ALT 18 03/17/2021: BUN 18; Creatinine, Ser 0.88; Hemoglobin 14.8; Platelets 263; Potassium 3.4; Sodium 140  Recent Lipid Panel    Component Value Date/Time   CHOL 196 11/07/2020 0849   TRIG 53 11/07/2020 0849   HDL 78 11/07/2020 0849   CHOLHDL 2.5 11/07/2020 0849   LDLCALC 108 (H) 11/07/2020 0849     Risk Assessment/Calculations:      Physical Exam:    VS:  BP (!) 160/88 (BP Location: Left Arm, Patient Position: Sitting, Cuff Size: Normal)   Pulse 69   Ht 5\' 3"  (1.6 m)   Wt 120 lb (54.4 kg)   SpO2 99%   BMI 21.26 kg/m     Wt Readings from Last 3 Encounters:  03/24/21 120 lb (54.4 kg)  03/17/21 117 lb (53.1 kg)  01/12/21 120 lb (54.4 kg)     GEN:  Well nourished, well developed in no acute distress HEENT: Normal NECK: No JVD; No carotid bruits LYMPHATICS: No lymphadenopathy CARDIAC: RRR, no murmurs, rubs, gallops RESPIRATORY:  Clear to auscultation without rales, wheezing or rhonchi  ABDOMEN: Soft, non-tender, non-distended MUSCULOSKELETAL:  No edema; No deformity  SKIN: Warm and dry NEUROLOGIC:  Alert and oriented x 3 PSYCHIATRIC:  Normal affect   ASSESSMENT:    1. Paroxysmal atrial fibrillation (HCC)   2. Primary hypertension    PLAN:    In order of problems listed above:  1. Paroxysmal atrial fibrillation, CHA2DS2-VASc of 4 (age, htn, gender).  Start Cardizem CD 120 mg daily.  Start Eliquis 5 mg twice daily.  Get echocardiogram.  If she stays asymptomatic while on Cardizem, will continue with medication management.  If she becomes symptomatic, will consider EP input for antiarrhythmics/ablation consideration. 2. Hypertension, HCTZ and Cardizem.  Consider decreasing HCTZ if BP becomes low.  Advised to check  blood pressure frequently at home.  Follow-up after echocardiogram.     Medication Adjustments/Labs and Tests Ordered: Current medicines are reviewed at length with the patient today.  Concerns regarding medicines are outlined above.  Orders Placed This Encounter  Procedures  . EKG 12-Lead  . ECHOCARDIOGRAM COMPLETE   Meds ordered this encounter  Medications  . diltiazem (CARDIZEM CD) 120 MG 24 hr capsule    Sig: Take 1 capsule (120 mg total) by mouth daily.    Dispense:  30 capsule    Refill:  3  .  apixaban (ELIQUIS) 5 MG TABS tablet    Sig: Take 1 tablet (5 mg total) by mouth 2 (two) times daily.    Dispense:  60 tablet    Refill:  3    Patient Instructions  Medication Instructions:  Your physician has recommended you make the following change in your medication:   1.  STOP taking your Diltiazem (Cardizem).  2.  START taking Diltiazem (Cardizem CD) once daily.  3.  START taking Eliquis 5 MG twice a day.  *If you need a refill on your cardiac medications before your next appointment, please call your pharmacy*   Lab Work: None ordered If you have labs (blood work) drawn today and your tests are completely normal, you will receive your results only by: Marland Kitchen MyChart Message (if you have MyChart) OR . A paper copy in the mail If you have any lab test that is abnormal or we need to change your treatment, we will call you to review the results.   Testing/Procedures:  Your physician has requested that you have an echocardiogram. Echocardiography is a painless test that uses sound waves to create images of your heart. It provides your doctor with information about the size and shape of your heart and how well your heart's chambers and valves are working. This procedure takes approximately one hour. There are no restrictions for this procedure.    Follow-Up: At [facility], you and your health needs are our priority.  As part of our continuing mission to provide you with  exceptional heart care, we have created designated Provider Care Teams.  These Care Teams include your primary Cardiologist (physician) and Advanced Practice Providers (APPs -  Physician Assistants and Nurse Practitioners) who all work together to provide you with the care you need, when you need it.  We recommend signing up for the patient portal called "MyChart".  Sign up information is provided on this After Visit Summary.  MyChart is used to connect with patients for Virtual Visits (Telemedicine).  Patients are able to view lab/test results, encounter notes, upcoming appointments, etc.  Non-urgent messages can be sent to your provider as well.   To learn more about what you can do with MyChart, go to [email or web address].    Your next appointment:   Follow up after Echo   The format for your next appointment:   In Person  Provider:   Kate Sable, MD   Other Instructions      Signed, Kate Sable, MD  03/24/2021 4:31 PM    [place]

## 2021-04-04 ENCOUNTER — Telehealth: Payer: Self-pay | Admitting: Cardiology

## 2021-04-04 ENCOUNTER — Ambulatory Visit
Admission: EM | Admit: 2021-04-04 | Discharge: 2021-04-04 | Disposition: A | Discharge status: Home / Self Care | Payer: Medicare HMO | Attending: Emergency Medicine | Admitting: Emergency Medicine

## 2021-04-04 ENCOUNTER — Ambulatory Visit: Payer: Self-pay

## 2021-04-04 ENCOUNTER — Encounter: Payer: Self-pay | Admitting: Emergency Medicine

## 2021-04-04 ENCOUNTER — Other Ambulatory Visit: Payer: Self-pay

## 2021-04-04 DIAGNOSIS — I4891 Unspecified atrial fibrillation: Secondary | ICD-10-CM | POA: Insufficient documentation

## 2021-04-04 DIAGNOSIS — I10 Essential (primary) hypertension: Secondary | ICD-10-CM | POA: Diagnosis not present

## 2021-04-04 DIAGNOSIS — K219 Gastro-esophageal reflux disease without esophagitis: Secondary | ICD-10-CM | POA: Diagnosis not present

## 2021-04-04 DIAGNOSIS — Z87891 Personal history of nicotine dependence: Secondary | ICD-10-CM | POA: Insufficient documentation

## 2021-04-04 DIAGNOSIS — I480 Paroxysmal atrial fibrillation: Secondary | ICD-10-CM

## 2021-04-04 DIAGNOSIS — M545 Low back pain, unspecified: Secondary | ICD-10-CM | POA: Diagnosis not present

## 2021-04-04 DIAGNOSIS — Z79899 Other long term (current) drug therapy: Secondary | ICD-10-CM | POA: Insufficient documentation

## 2021-04-04 HISTORY — DX: Unspecified atrial fibrillation: I48.91

## 2021-04-04 LAB — POCT URINALYSIS DIP (MANUAL ENTRY)
Bilirubin, UA: NEGATIVE
Blood, UA: NEGATIVE
Glucose, UA: NEGATIVE mg/dL
Ketones, POC UA: NEGATIVE mg/dL
Nitrite, UA: NEGATIVE
Protein Ur, POC: NEGATIVE mg/dL
Spec Grav, UA: >=1.03 — AB (ref 1.010–1.025)
Urobilinogen, UA: 0.2 E.U./dL
pH, UA: 5.5 (ref 5.0–8.0)

## 2021-04-04 NOTE — Telephone Encounter (Addendum)
Called patient and informed her that I tried to call earlier but she was at urgent care. Patient stated that she took an extra Diltiazem last night when she felt an episode of Afib. This morning she felt cold and clammy and dizzy. I told her not to an extra Diltiazem again. Encouraged patient to push fluids. Patient agreed with plan and would call us again if her symptoms were to return. Her hip pain was addressed at urgent care today.  Patient was grateful for the call back.

## 2021-04-04 NOTE — Telephone Encounter (Signed)
Agree and noted since no appointments available in office.

## 2021-04-04 NOTE — Discharge Instructions (Signed)
Take Tylenol as needed for discomfort.    Follow up with an orthopedist such as the one listed below if your symptoms are not improving.

## 2021-04-04 NOTE — Telephone Encounter (Signed)
Patient c/o Palpitations:  High priority if patient c/o lightheadedness, shortness of breath, or chest pain  1) How long have you had palpitations/irregular HR/ Afib? Are you having the symptoms now? 3 weeks onset in ED and then consult with [facility] .  NO .  Last episode last night before bed   2) Are you currently experiencing lightheadedness, SOB or CP? No but episode this morning of dizziness and cold sweats this morning  For an hour .   3) Do you have a history of afib (atrial fibrillation) or irregular heart rhythm? Yes   4) Have you checked your BP or HR? (document readings if available):  No   5) Are you experiencing any other symptoms?  Hip Pain   Patient reports 1 episode of afib last night at bedtime .  She took a dose of diltiazem and fell asleep.  Patient awoke in the morning with dizziness and cold sweats that subsided after about an hour.  Patient no longer feeling afib and denies other cardiac symptoms at this time.   Patient only c/o of hip pain at this time and states she is now having difficulty ambulating due to pain.   Patient wants advise on what is safe to take for hip pain as she read the warning for interactions for cardiac patient on aleve and ibuprofen .

## 2021-04-04 NOTE — ED Triage Notes (Signed)
Patient c/o LFT sided back pain x 1 day.   Patient endorses onset of symptoms began after work.   Patient endorses pain doesn't change in any position.  Patient denies any fall or trauma.   Patient describes "a pinching pain". Patient endorses when " I stand the pain radiates down my leg a little bit".   Patient used Ice with no relief of symptoms.   Patient denies a history of sciatic nerve.

## 2021-04-04 NOTE — Telephone Encounter (Signed)
Pt c/o sudden onset severe right hip pain last night that continues today. Pt stated that she is unsure what OTC meds she can take for pain. She takes Eliquis and Cardizem for atrial fibrillation. Pt stated she called cardiologist with concerns of her a fib onset last night and what OTC meds to take but has not gotten a call returned. Pt unable to see if right hip is swollen or red.  Advised pt to go to [facility] for evaluation. Called office and no available appt with PCP. Care advice given and pt stated she will go to [facility]. Routing to office for PCP to review.    Reason for Disposition . [1] SEVERE pain (e.g., excruciating, unable to do any normal activities) AND [2] not improved after 2 hours of pain medicine  Answer Assessment - Initial Assessment Questions 1. LOCATION and RADIATION: "Where is the pain located?"      Right hip 2. QUALITY: "What does the pain feel like?"  (e.g., sharp, dull, aching, burning)    aching 3. SEVERITY: "How bad is the pain?" "What does it keep you from doing?"   (Scale 1-10; or mild, moderate, severe)   -  MILD (1-3): doesn't interfere with normal activities    -  MODERATE (4-7): interferes with normal activities (e.g., work or school) or awakens from sleep, limping    -  SEVERE (8-10): excruciating pain, unable to do any normal activities, unable to walk    severe 4. ONSET: "When did the pain start?" "Does it come and go, or is it there all the time?"    Last night- constant 5. WORK OR EXERCISE: "Has there been any recent work or exercise that involved this part of the body?"      no 6. CAUSE: "What do you think is causing the hip pain?"      unkown 7. AGGRAVATING FACTORS: "What makes the hip pain worse?" (e.g., walking, climbing stairs, running)     walking 8. OTHER SYMPTOMS: "Do you have any other symptoms?" (e.g., back pain, pain shooting down leg,  fever, rash)     no  Protocols used: HIP PAIN-A-AH

## 2021-04-04 NOTE — ED Provider Notes (Signed)
Alexis Lyons    CSN: [id number] Arrival date & time: 04/04/21  1246      History   Chief Complaint Chief Complaint  Patient presents with  . Back Pain    HPI Alexis Lyons is a 80 y.o. female.   Patient presents with left lower back pain since yesterday.  No falls or injury.  She states the pain began after work.  The pain is nonradiating, 10/10, worse with movement and changing positions; improves with rest.  She denies numbness, weakness, paresthesias, saddle anesthesia, loss of bowel/bladder control, abdominal pain, dysuria, hematuria, or other symptoms.  Treatment attempted at home with ice packs.  Her medical history history includes atrial fibrillation, hypertension, GERD, particular disease, seizures, chronic headache.  The history is provided by the patient and medical records.    Past Medical History:  Diagnosis Date  . Amnesia   . Aphthae   . Atrial fibrillation ([place])   . Congestion-fibrosis syndrome   . DD (diverticular disease)   . GERD (gastroesophageal reflux disease)   . Seizures ([place]) 12/2017   passed out due to dehydration    Patient Active Problem List   Diagnosis Date Noted  . Chronic headache 01/01/2019  . Essential hypertension 07/09/2018  . Osteopenia 11/06/2017  . Seborrheic dermatitis 11/05/2017  . Aphthae 05/28/2015  . DD (diverticular disease) 05/28/2015  . Fatigue 05/28/2015  . Hernia, inguinal, right 05/28/2015  . Amnesia 05/28/2015  . 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 Atrophy of vagina 05/28/2015  . Vaginal lesion 09/21/2008  . Acid reflux 06/15/2008  . Insomnia 12/17/2004    Past Surgical History:  Procedure Laterality Date  . CHOLECYSTECTOMY    . colonoscopy with polypectomy     8/11 4 mm polyp, 12 mm polyp  . COLONOSCOPY WITH PROPOFOL N/A 02/24/2018   Procedure: COLONOSCOPY WITH PROPOFOL;  Surgeon: Manya Silvas, MD;  Location[facility] ENDOSCOPY;  Service: Endoscopy;  Laterality: N/A;  . HERNIA REPAIR  Right 2004   inguinal   . HERNIA REPAIR     x's 3    OB History    Gravida  2   Para  2   Term      Preterm      AB      Living        SAB      IAB      Ectopic      Multiple      Live Births               Home Medications    Prior to Admission medications   Medication Sig Start Date End Date Taking? Authorizing Provider  apixaban (ELIQUIS) 5 MG TABS tablet Take 1 tablet (5 mg total) by mouth 2 (two) times daily. 03/24/21  Yes Kate Sable, MD  diltiazem (CARDIZEM CD) 120 MG 24 hr capsule Take 1 capsule (120 mg total) by mouth daily. 03/24/21 06/22/21 Yes Agbor-Etang, Aaron Edelman, MD  Ginseng 100 MG CAPS every other day.  06/08/09  Yes [provider]  hydrochlorothiazide (HYDRODIURIL) 25 MG tablet TAKE 1 TABLET BY MOUTH ONCE DAILY 02/22/21  Yes Bacigalupo, Dionne Bucy, MD  pantoprazole (PROTONIX) 40 MG tablet Take 1 tablet (40 mg total) by mouth daily. 03/18/21 03/18/22 Yes Veronese, [place], MD  ketoconazole (NIZORAL) 2 % shampoo Apply topically. 10/24/20   [provider]  zolpidem (AMBIEN) 5 MG tablet TAKE 1/2 TABLET BY MOUTH AT BEDTIME AS NEEDED SLEEP 10/17/20   Bacigalupo, Dionne Bucy, MD  Family History Family History  Problem Relation Age of Onset  . Alzheimer's disease Mother   . Alcohol abuse Father   . Cancer Father        liver cancer  . Cancer Sister        breast  . Breast cancer Sister     Social History Social History   Tobacco Use  . Smoking status: Former Smoker    Packs/day: 0.50    Years: 15.00    Pack years: 7.50    Types: Cigarettes    Quit date: 12/16/1985    Years since quitting: 35.3  . Smokeless tobacco: Never Used  Vaping Use  . Vaping Use: Never used  Substance Use Topics  . Alcohol use: Yes    Alcohol/week: 7.0 standard drinks    Types: 7 Glasses of wine per week    Comment: glass of wine with dinner  . Drug use: No     Allergies   Codeine   Review of Systems Review of Systems  Constitutional:  Negative for chills and fever.  HENT: Negative for ear pain and sore throat.   Eyes: Negative for pain and visual disturbance.  Respiratory: Negative for cough and shortness of breath.   Cardiovascular: Negative for chest pain and palpitations.  Gastrointestinal: Negative for abdominal pain and vomiting.  Genitourinary: Negative for dysuria and hematuria.  Musculoskeletal: Positive for back pain. Negative for arthralgias.  Skin: Negative for color change and rash.  Neurological: Negative for syncope, weakness and numbness.  All other systems reviewed and are negative.    Physical Exam Triage Vital Signs ED Triage Vitals  Enc Vitals Group     BP      Pulse      Resp      Temp      Temp src      SpO2      Weight      Height      Head Circumference      Peak Flow      Pain Score      Pain Loc      Pain Edu?      Excl. in [place]?    No data found.  Updated Vital Signs BP 119/71 (BP Location: Left Arm)   Pulse 68   Temp 98.3 F (36.8 C) (Oral)   Resp 12   SpO2 94%   Visual Acuity Right Eye Distance:   Left Eye Distance:   Bilateral Distance:    Right Eye Near:   Left Eye Near:    Bilateral Near:     Physical Exam Vitals and nursing note reviewed.  Constitutional:      General: She is not in acute distress.    Appearance: She is well-developed.  HENT:     Head: Normocephalic and atraumatic.     Mouth/Throat:     Mouth: Mucous membranes are moist.  Eyes:     Conjunctiva/sclera: Conjunctivae normal.  Cardiovascular:     Rate and Rhythm: Normal rate and regular rhythm.     Heart sounds: Normal heart sounds.  Pulmonary:     Effort: Pulmonary effort is normal. No respiratory distress.     Breath sounds: Normal breath sounds.  Abdominal:     Palpations: Abdomen is soft.     Tenderness: There is no abdominal tenderness. There is no right CVA tenderness, left CVA tenderness, guarding or rebound.  Musculoskeletal:        General: Tenderness present. No swelling  or  deformity. Normal range of motion.     Cervical back: Neck supple.       Back:  Skin:    General: Skin is warm and dry.     Capillary Refill: Capillary refill takes less than 2 seconds.     Findings: No bruising, erythema, lesion or rash.  Neurological:     General: No focal deficit present.     Mental Status: She is alert and oriented to person, place, and time.     Sensory: No sensory deficit.     Motor: No weakness.     Gait: Gait normal.     Comments: Negative straight leg raise.  Psychiatric:        Mood and Affect: Mood normal.        Behavior: Behavior normal.      UC Treatments / Results  Labs (all labs ordered are listed, but only abnormal results are displayed) Labs Reviewed  POCT URINALYSIS DIP (MANUAL ENTRY) - Abnormal; Notable for the following components:      Result Value   Spec Grav, UA >=1.030 (*)    Leukocytes, UA Trace (*)    All other components within normal limits  URINE CULTURE    EKG   Radiology No results found.  Procedures Procedures (including critical care time)  Medications Ordered in UC Medications - No data to display  Initial Impression / Assessment and Plan / UC Course  I have reviewed the triage vital signs and the nursing notes.  Pertinent labs & imaging results that were available during my care of the patient were reviewed by me and considered in my medical decision making (see chart for details).   Acute left lower back pain without sciatica.  Patient reports no falls or injury.  The pain is nonradiating and she has no associated symptoms.  I see no indication of infection or injury.  Urine does not appear to show UTI.  Urine culture pending.  Instructed patient to take Tylenol as needed for discomfort.  Education provided on acute back pain.  Instructed her to follow-up with orthopedics if her symptoms are not improving.  She agrees to plan of care.   Final Clinical Impressions(s) / UC Diagnoses   Final diagnoses:   Acute left-sided low back pain without sciatica     Discharge Instructions     Take Tylenol as needed for discomfort.    Follow up with an orthopedist such as the one listed below if your symptoms are not improving.        ED Prescriptions    None     PDMP not reviewed this encounter.   Sharion Balloon, NP 04/04/21 1339

## 2021-04-04 NOTE — Telephone Encounter (Signed)
FYI

## 2021-04-05 LAB — URINE CULTURE: Culture: NO GROWTH

## 2021-04-06 ENCOUNTER — Telehealth: Payer: Self-pay | Admitting: Cardiology

## 2021-04-06 DIAGNOSIS — M47896 Other spondylosis, lumbar region: Secondary | ICD-10-CM | POA: Diagnosis not present

## 2021-04-06 NOTE — Telephone Encounter (Signed)
Called patient and gave her the recommendations as stated below by the pharmacist. She was very grateful for the phonically.

## 2021-04-06 NOTE — Telephone Encounter (Signed)
Patient calling  Patient just left [facility] due to back pain and they prescribed her a muscle relaxer and a steroid Would like to know if these will be ok to take along with heart medications  Medrol and Methocarbamol 500 MG  Please call to discuss ASAP

## 2021-04-06 NOTE — Telephone Encounter (Signed)
No significant interactions are expected with current cardiac medication and methocarbamol

## 2021-04-20 ENCOUNTER — Ambulatory Visit (INDEPENDENT_AMBULATORY_CARE_PROVIDER_SITE_OTHER): Payer: Medicare HMO

## 2021-04-20 ENCOUNTER — Other Ambulatory Visit: Payer: Self-pay

## 2021-04-20 ENCOUNTER — Other Ambulatory Visit: Payer: Medicare HMO

## 2021-04-20 DIAGNOSIS — I480 Paroxysmal atrial fibrillation: Secondary | ICD-10-CM

## 2021-04-20 LAB — ECHOCARDIOGRAM COMPLETE
AR max vel: 2.44 cm2
AV Area VTI: 2.49 cm2
AV Area mean vel: 2.61 cm2
AV Mean grad: 3 mmHg
AV Peak grad: 6.1 mmHg
Ao pk vel: 1.23 m/s
Area-P 1/2: 2.94 cm2
Calc EF: 78.3 %
S' Lateral: 1.7 cm
Single Plane A2C EF: 75 %
Single Plane A4C EF: 83.1 %

## 2021-04-24 ENCOUNTER — Other Ambulatory Visit: Payer: Self-pay

## 2021-04-24 ENCOUNTER — Ambulatory Visit: Payer: Medicare HMO | Admitting: Cardiology

## 2021-04-24 ENCOUNTER — Encounter: Payer: Self-pay | Admitting: Cardiology

## 2021-04-24 VITALS — BP 120/68 | HR 61 | Ht 63.5 in | Wt 119.0 lb

## 2021-04-24 DIAGNOSIS — I480 Paroxysmal atrial fibrillation: Secondary | ICD-10-CM

## 2021-04-24 DIAGNOSIS — I10 Essential (primary) hypertension: Secondary | ICD-10-CM | POA: Diagnosis not present

## 2021-04-24 NOTE — Patient Instructions (Signed)
Medication Instructions:  Please continue your current medications  *If you need a refill on your cardiac medications before your next appointment, please call your pharmacy*   Lab Work: None  Testing/Procedures: None  Follow-Up: At [facility], you and your health needs are our priority.  As part of our continuing mission to provide you with exceptional heart care, we have created designated Provider Care Teams.  These Care Teams include your primary Cardiologist (physician) and Advanced Practice Providers (APPs -  Physician Assistants and Nurse Practitioners) who all work together to provide you with the care you need, when you need it.  Your next appointment:   3 month(s)  The format for your next appointment:   In Person  Provider:   You may see Kate Sable, MD or one of the following Advanced Practice Providers on your designated Care Team:    Murray Hodgkins, NP  Christell Faith, PA-C  Marrianne Mood, PA-C  Cadence [place], [place]

## 2021-04-24 NOTE — Progress Notes (Signed)
Cardiology Office Note:    Date:  04/24/2021   ID:  Alexis Lyons, DOB 09/23/1941, MRN [id number]  PCP:  Virginia Crews, MD   [place]  Cardiologist:  Kate Sable, MD  Advanced Practice Provider:  No care team member to display Electrophysiologist:  None       Referring MD: Kate Sable, MD   Chief Complaint  Patient presents with  . Other    Follow up post ECHO. Meds reviewed verbally with patient.     History of Present Illness:    Alexis Lyons is a 80 y.o. female with a hx of paroxysmal atrial fibrillation, hypertension, who presents for follow-up.  She was last seen due to atrial fibrillation.   Had episodes of dizziness, symptomatic A. fib, ED visit revealed A. fib with RVR.  Received IV Cardizem with conversion to sinus rhythm.  She was started on Cardizem 120 mg daily to help with both A. fib and blood pressure control.  Eliquis was also started.  Echocardiogram was obtained, she now presents for results.  States having 1 episode of palpitations a week ago associated with dizziness.  Denies chest pain.  Otherwise she has felt well, tolerating Cardizem and Eliquis without any bleeding effects.   Past Medical History:  Diagnosis Date  . Amnesia   . Aphthae   . Atrial fibrillation ([place])   . Congestion-fibrosis syndrome   . DD (diverticular disease)   . GERD (gastroesophageal reflux disease)   . Seizures ([place]) 12/2017   passed out due to dehydration    Past Surgical History:  Procedure Laterality Date  . CHOLECYSTECTOMY    . colonoscopy with polypectomy     8/11 4 mm polyp, 12 mm polyp  . COLONOSCOPY WITH PROPOFOL N/A 02/24/2018   Procedure: COLONOSCOPY WITH PROPOFOL;  Surgeon: Manya Silvas, MD;  Location[facility] ENDOSCOPY;  Service: Endoscopy;  Laterality: N/A;  . HERNIA REPAIR Right 2004   inguinal   . HERNIA REPAIR     x's 3    Current Medications: Current Meds  Medication Sig  . apixaban (ELIQUIS) 5 MG  TABS tablet Take 1 tablet (5 mg total) by mouth 2 (two) times daily.  Marland Kitchen diltiazem (CARDIZEM CD) 120 MG 24 hr capsule Take 1 capsule (120 mg total) by mouth daily.  . Ginseng 100 MG CAPS every other day.   . hydrochlorothiazide (HYDRODIURIL) 25 MG tablet TAKE 1 TABLET BY MOUTH ONCE DAILY  . zolpidem (AMBIEN) 5 MG tablet TAKE 1/2 TABLET BY MOUTH AT BEDTIME AS NEEDED SLEEP     Allergies:   Codeine   Social History   Socioeconomic History  . Marital status: Divorced    Spouse name: Not on file  . Number of children: 2  . Years of education: 103  . Highest education level: Not on file  Occupational History  . Occupation: [employer] in [place] Use  . Smoking status: Former Smoker    Packs/day: 0.50    Years: 15.00    Pack years: 7.50    Types: Cigarettes    Quit date: 12/16/1985    Years since quitting: 35.3  . Smokeless tobacco: Never Used  Vaping Use  . Vaping Use: Never used  Substance and Sexual Activity  . Alcohol use: Yes    Alcohol/week: 7.0 standard drinks    Types: 7 Glasses of wine per week    Comment: glass of wine with dinner  . Drug use: No  . Sexual  activity: Not Currently  Other Topics Concern  . Not on file  Social History Narrative  . Not on file   Social Determinants of Health   Financial Resource Strain: Not on file  Food Insecurity: Not on file  Transportation Needs: Not on file  Physical Activity: Not on file  Stress: Not on file  Social Connections: Not on file     Family History: The patient's family history includes Alcohol abuse in her father; Alzheimer's disease in her mother; Breast cancer in her sister; Cancer in her father and sister.  ROS:   Please see the history of present illness.     All other systems reviewed and are negative.  EKGs/Labs/Other Studies Reviewed:    The following studies were reviewed today:   EKG:  EKG not ordered today.    Recent Labs: 03/14/2021: ALT 18 03/17/2021: BUN 18; Creatinine, Ser 0.88;  Hemoglobin 14.8; Platelets 263; Potassium 3.4; Sodium 140  Recent Lipid Panel    Component Value Date/Time   CHOL 196 11/07/2020 0849   TRIG 53 11/07/2020 0849   HDL 78 11/07/2020 0849   CHOLHDL 2.5 11/07/2020 0849   LDLCALC 108 (H) 11/07/2020 0849     Risk Assessment/Calculations:      Physical Exam:    VS:  BP 120/68 (BP Location: Left Arm, Patient Position: Sitting, Cuff Size: Normal)   Pulse 61   Ht 5' 3.5" (1.613 m)   Wt 119 lb (54 kg)   SpO2 98%   BMI 20.75 kg/m     Wt Readings from Last 3 Encounters:  04/24/21 119 lb (54 kg)  03/24/21 120 lb (54.4 kg)  03/17/21 117 lb (53.1 kg)     GEN:  Well nourished, well developed in no acute distress HEENT: Normal NECK: No JVD; No carotid bruits LYMPHATICS: No lymphadenopathy CARDIAC: RRR, no murmurs, rubs, gallops RESPIRATORY:  Clear to auscultation without rales, wheezing or rhonchi  ABDOMEN: Soft, non-tender, non-distended MUSCULOSKELETAL:  No edema; No deformity  SKIN: Warm and dry NEUROLOGIC:  Alert and oriented x 3 PSYCHIATRIC:  Normal affect   ASSESSMENT:    1. Paroxysmal atrial fibrillation (HCC)   2. Primary hypertension    PLAN:    In order of problems listed above:  1. Paroxysmal atrial fibrillation, CHA2DS2-VASc of 4 (age, htn, gender).  1 episode of palpitation.  Continue Cardizem CD 120 mg daily, Eliquis 5 mg twice daily.  Echocardiogram obtained 04/2021 showed normal systolic function, EF 60 to 65%, impaired relaxation, normal LA size.  Therapeutic options discussed, patient would like to continue current medications and monitor symptoms for now.  If symptoms persist, will refer to A. fib/EP for additional input regarding antiarrhythmics and or ablation. 2. Hypertension, BP controlled.  Continue HCTZ and Cardizem.  Follow-up in 3 months.   Medication Adjustments/Labs and Tests Ordered: Current medicines are reviewed at length with the patient today.  Concerns regarding medicines are outlined above.   No orders of the defined types were placed in this encounter.  No orders of the defined types were placed in this encounter.   Patient Instructions  Medication Instructions:  Please continue your current medications  *If you need a refill on your cardiac medications before your next appointment, please call your pharmacy*   Lab Work: None  Testing/Procedures: None  Follow-Up: At [facility], you and your health needs are our priority.  As part of our continuing mission to provide you with exceptional heart care, we have created designated Provider Care Teams.  These Care Teams include your primary Cardiologist (physician) and Advanced Practice Providers (APPs -  Physician Assistants and Nurse Practitioners) who all work together to provide you with the care you need, when you need it.  Your next appointment:   3 month(s)  The format for your next appointment:   In Person  Provider:   You may see Kate Sable, MD or one of the following Advanced Practice Providers on your designated Care Team:    Murray Hodgkins, NP  Christell Faith, PA-C  Marrianne Mood, PA-C  Cadence [place], [place]      Signed, Kate Sable, MD  04/24/2021 4:44 PM    [place]

## 2021-05-01 ENCOUNTER — Ambulatory Visit: Payer: Self-pay | Admitting: Family Medicine

## 2021-05-08 ENCOUNTER — Ambulatory Visit: Payer: Medicare HMO | Admitting: Cardiology

## 2021-06-05 ENCOUNTER — Encounter: Payer: Self-pay | Admitting: Family Medicine

## 2021-06-05 DIAGNOSIS — I10 Essential (primary) hypertension: Secondary | ICD-10-CM | POA: Diagnosis not present

## 2021-06-05 DIAGNOSIS — D6869 Other thrombophilia: Secondary | ICD-10-CM | POA: Diagnosis not present

## 2021-06-05 DIAGNOSIS — I4891 Unspecified atrial fibrillation: Secondary | ICD-10-CM | POA: Diagnosis not present

## 2021-06-05 DIAGNOSIS — R69 Illness, unspecified: Secondary | ICD-10-CM | POA: Diagnosis not present

## 2021-06-05 DIAGNOSIS — Z87891 Personal history of nicotine dependence: Secondary | ICD-10-CM | POA: Diagnosis not present

## 2021-06-05 DIAGNOSIS — Z8249 Family history of ischemic heart disease and other diseases of the circulatory system: Secondary | ICD-10-CM | POA: Diagnosis not present

## 2021-06-05 DIAGNOSIS — Z008 Encounter for other general examination: Secondary | ICD-10-CM | POA: Diagnosis not present

## 2021-06-05 DIAGNOSIS — Z7722 Contact with and (suspected) exposure to environmental tobacco smoke (acute) (chronic): Secondary | ICD-10-CM | POA: Diagnosis not present

## 2021-06-05 DIAGNOSIS — Z7901 Long term (current) use of anticoagulants: Secondary | ICD-10-CM | POA: Diagnosis not present

## 2021-06-05 DIAGNOSIS — Z803 Family history of malignant neoplasm of breast: Secondary | ICD-10-CM | POA: Diagnosis not present

## 2021-06-07 DIAGNOSIS — X32XXXA Exposure to sunlight, initial encounter: Secondary | ICD-10-CM | POA: Diagnosis not present

## 2021-06-07 DIAGNOSIS — L659 Nonscarring hair loss, unspecified: Secondary | ICD-10-CM | POA: Diagnosis not present

## 2021-06-07 DIAGNOSIS — L570 Actinic keratosis: Secondary | ICD-10-CM | POA: Diagnosis not present

## 2021-06-07 DIAGNOSIS — L400 Psoriasis vulgaris: Secondary | ICD-10-CM | POA: Diagnosis not present

## 2021-06-07 DIAGNOSIS — L718 Other rosacea: Secondary | ICD-10-CM | POA: Diagnosis not present

## 2021-06-07 DIAGNOSIS — L720 Epidermal cyst: Secondary | ICD-10-CM | POA: Diagnosis not present

## 2021-06-15 ENCOUNTER — Encounter: Payer: Self-pay | Admitting: Family Medicine

## 2021-06-15 ENCOUNTER — Other Ambulatory Visit: Payer: Self-pay

## 2021-06-15 ENCOUNTER — Ambulatory Visit (INDEPENDENT_AMBULATORY_CARE_PROVIDER_SITE_OTHER): Payer: Medicare HMO | Admitting: Family Medicine

## 2021-06-15 VITALS — BP 122/76 | HR 55 | Temp 98.4°F | Resp 16 | Ht 63.5 in | Wt 118.0 lb

## 2021-06-15 DIAGNOSIS — D692 Other nonthrombocytopenic purpura: Secondary | ICD-10-CM | POA: Diagnosis not present

## 2021-06-15 DIAGNOSIS — I10 Essential (primary) hypertension: Secondary | ICD-10-CM

## 2021-06-15 DIAGNOSIS — I480 Paroxysmal atrial fibrillation: Secondary | ICD-10-CM | POA: Diagnosis not present

## 2021-06-15 NOTE — Assessment & Plan Note (Signed)
Continue to monitor

## 2021-06-15 NOTE — Assessment & Plan Note (Signed)
Well controlled Continue current medications Reviewed recent metabolic panel 

## 2021-06-15 NOTE — Assessment & Plan Note (Signed)
In NSR F/b Cardiology  Continue current meds

## 2021-06-15 NOTE — Progress Notes (Signed)
Established patient visit   Patient: Alexis Lyons   DOB: 1941/12/16   80 y.o. Female  MRN: [id number] Visit Date: 06/15/2021  Today's healthcare provider: Lavon Paganini, MD   Chief Complaint  Patient presents with   Hypertension   Subjective    Hypertension Pertinent negatives include no chest pain, headaches, neck pain, palpitations or shortness of breath.    Hypertension, follow-up  BP Readings from Last 3 Encounters:  06/15/21 122/76  04/24/21 120/68  04/04/21 119/71   Wt Readings from Last 3 Encounters:  06/15/21 118 lb (53.5 kg)  04/24/21 119 lb (54 kg)  03/24/21 120 lb (54.4 kg)     She was last seen for hypertension 7 months ago.  BP at that visit was 120/68. Management since that visit includes; well controlled. Continue medications. She reports good compliance with treatment. She is not having side effects.  She is exercising. She is adherent to low salt diet.   Outside blood pressures are checked occasionally.  She does not smoke.  Use of agents associated with hypertension: none.   Atrial Fibrillation  She has atrial fibrillation and she doesn't want to take 5 mg eliquis. She is taking eliquis for stroke prevention. She has a fear that she will fall and be unaware if she has internal bleeding leading to death. She sees cardiology every 3 months.      Medications: Outpatient Medications Prior to Visit  Medication Sig   apixaban (ELIQUIS) 5 MG TABS tablet Take 1 tablet (5 mg total) by mouth 2 (two) times daily.   diltiazem (CARDIZEM CD) 120 MG 24 hr capsule Take 1 capsule (120 mg total) by mouth daily.   Ginseng 100 MG CAPS every other day.    hydrochlorothiazide (HYDRODIURIL) 25 MG tablet TAKE 1 TABLET BY MOUTH ONCE DAILY   zolpidem (AMBIEN) 5 MG tablet TAKE 1/2 TABLET BY MOUTH AT BEDTIME AS NEEDED SLEEP   No facility-administered medications prior to visit.   Review of Systems  Constitutional:  Negative for appetite change, chills,  fatigue and fever.  HENT:  Negative for ear pain, rhinorrhea, sinus pressure, sinus pain and sore throat.   Eyes:  Negative for pain and visual disturbance.  Respiratory:  Negative for cough, chest tightness, shortness of breath and wheezing.   Cardiovascular:  Negative for chest pain, palpitations and leg swelling.  Gastrointestinal:  Negative for abdominal pain, blood in stool, constipation, diarrhea, nausea and vomiting.  Genitourinary:  Negative for dysuria, flank pain, frequency, pelvic pain and urgency.  Musculoskeletal:  Negative for back pain, myalgias and neck pain.  Neurological:  Negative for dizziness, seizures, syncope, weakness, light-headedness, numbness and headaches.      Objective    BP 122/76   Pulse (!) 55   Temp 98.4 F (36.9 C)   Resp 16   Ht 5' 3.5" (1.613 m)   Wt 118 lb (53.5 kg)   BMI 20.57 kg/m     Physical Exam Vitals reviewed.  Constitutional:      General: She is not in acute distress.    Appearance: Normal appearance. She is well-developed. She is not diaphoretic.  HENT:     Head: Normocephalic and atraumatic.  Eyes:     General: No scleral icterus.    Conjunctiva/sclera: Conjunctivae normal.  Neck:     Thyroid: No thyromegaly.  Cardiovascular:     Rate and Rhythm: Normal rate and regular rhythm.     Pulses: Normal pulses.     Heart  sounds: Normal heart sounds. No murmur heard. Pulmonary:     Effort: Pulmonary effort is normal. No respiratory distress.     Breath sounds: Normal breath sounds. No wheezing, rhonchi or rales.  Musculoskeletal:     Cervical back: Neck supple.     Right lower leg: No edema.     Left lower leg: No edema.  Lymphadenopathy:     Cervical: No cervical adenopathy.  Skin:    General: Skin is warm and dry.     Findings: No rash.  Neurological:     Mental Status: She is alert and oriented to person, place, and time. Mental status is at baseline.  Psychiatric:        Mood and Affect: Mood normal.        Behavior:  Behavior normal.    No results found for any visits on 06/15/21.  Assessment & Plan     Problem List Items Addressed This Visit       Cardiovascular and Mediastinum   Essential hypertension - Primary    Well controlled Continue current medications Reviewed recent metabolic panel       PAF (paroxysmal atrial fibrillation) ([place])    In NSR F/b Cardiology  Continue current meds       Senile purpura ([place])    Continue to monitor       Return in about 6 months (around 12/15/2021) for AWV, CPE.      I,Alexis Lyons,acting as a scribe for Lavon Paganini, MD.,have documented all relevant documentation on the behalf of Lavon Paganini, MD,as directed by  Lavon Paganini, MD while in the presence of Lavon Paganini, MD.  I, Lavon Paganini, MD, have reviewed all documentation for this visit. The documentation on 06/15/21 for the exam, diagnosis, procedures, and orders are all accurate and complete.   Draiden Mirsky, Dionne Bucy, MD, MPH [facility]

## 2021-06-22 ENCOUNTER — Other Ambulatory Visit: Payer: Self-pay

## 2021-06-22 MED ORDER — DILTIAZEM HCL ER COATED BEADS 120 MG PO CP24: 120.0000 mg | ORAL_CAPSULE | Freq: Every day | ORAL | 1 refills | Status: DC

## 2021-06-26 ENCOUNTER — Other Ambulatory Visit: Payer: Self-pay | Admitting: Family Medicine

## 2021-06-26 ENCOUNTER — Other Ambulatory Visit: Payer: Self-pay

## 2021-06-26 DIAGNOSIS — F5102 Adjustment insomnia: Secondary | ICD-10-CM

## 2021-06-26 MED ORDER — APIXABAN 5 MG PO TABS: 5.0000 mg | ORAL_TABLET | Freq: Two times a day (BID) | ORAL | 1 refills | Status: DC

## 2021-06-26 NOTE — Telephone Encounter (Signed)
54F, 53.5KG, SCR 0.85 03/17/21, LOVW/AGBORETANG 04/24/21

## 2021-07-27 ENCOUNTER — Other Ambulatory Visit: Payer: Self-pay

## 2021-07-27 MED ORDER — DILTIAZEM HCL ER COATED BEADS 120 MG PO CP24: 120.0000 mg | ORAL_CAPSULE | Freq: Every day | ORAL | 0 refills | Status: DC

## 2021-07-31 ENCOUNTER — Ambulatory Visit: Payer: Medicare HMO | Admitting: Cardiology

## 2021-08-18 ENCOUNTER — Encounter: Payer: Self-pay | Admitting: Cardiology

## 2021-08-18 ENCOUNTER — Emergency Department
Admission: EM | Admit: 2021-08-18 | Discharge: 2021-08-18 | Disposition: A | Discharge status: Home / Self Care | Payer: Medicare HMO | Attending: Emergency Medicine | Admitting: Emergency Medicine

## 2021-08-18 ENCOUNTER — Telehealth: Payer: Self-pay | Admitting: Cardiology

## 2021-08-18 ENCOUNTER — Encounter: Payer: Self-pay | Admitting: Emergency Medicine

## 2021-08-18 ENCOUNTER — Other Ambulatory Visit: Payer: Self-pay

## 2021-08-18 ENCOUNTER — Emergency Department: Payer: Medicare HMO

## 2021-08-18 ENCOUNTER — Ambulatory Visit: Payer: Medicare HMO | Admitting: Cardiology

## 2021-08-18 VITALS — BP 90/60 | HR 139 | Ht 63.5 in | Wt 116.0 lb

## 2021-08-18 DIAGNOSIS — Z87891 Personal history of nicotine dependence: Secondary | ICD-10-CM | POA: Diagnosis not present

## 2021-08-18 DIAGNOSIS — I10 Essential (primary) hypertension: Secondary | ICD-10-CM

## 2021-08-18 DIAGNOSIS — Z7901 Long term (current) use of anticoagulants: Secondary | ICD-10-CM | POA: Diagnosis not present

## 2021-08-18 DIAGNOSIS — I4891 Unspecified atrial fibrillation: Secondary | ICD-10-CM | POA: Insufficient documentation

## 2021-08-18 DIAGNOSIS — Z79899 Other long term (current) drug therapy: Secondary | ICD-10-CM | POA: Insufficient documentation

## 2021-08-18 DIAGNOSIS — R42 Dizziness and giddiness: Secondary | ICD-10-CM | POA: Diagnosis not present

## 2021-08-18 LAB — BASIC METABOLIC PANEL
Anion gap: 10 (ref 5–15)
BUN: 16 mg/dL (ref 8–23)
CO2: 25 mmol/L (ref 22–32)
Calcium: 9.3 mg/dL (ref 8.9–10.3)
Chloride: 101 mmol/L (ref 98–111)
Creatinine, Ser: 0.78 mg/dL (ref 0.44–1.00)
GFR, Estimated: >60 mL/min (ref >60)
Glucose, Bld: 174 mg/dL — ABNORMAL HIGH (ref 70–99)
Potassium: 3.4 mmol/L — ABNORMAL LOW (ref 3.5–5.1)
Sodium: 136 mmol/L (ref 135–145)

## 2021-08-18 LAB — CBC
HCT: 47.9 % — ABNORMAL HIGH (ref 36.0–46.0)
Hemoglobin: 16.4 g/dL — ABNORMAL HIGH (ref 12.0–15.0)
MCH: 30 pg (ref 26.0–34.0)
MCHC: 34.2 g/dL (ref 30.0–36.0)
MCV: 87.6 fL (ref 80.0–100.0)
Platelets: 280 10*3/uL (ref 150–400)
RBC: 5.47 MIL/uL — ABNORMAL HIGH (ref 3.87–5.11)
RDW: 12.3 % (ref 11.5–15.5)
WBC: 6.8 10*3/uL (ref 4.0–10.5)
nRBC: 0 % (ref 0.0–0.2)

## 2021-08-18 MED ORDER — METOPROLOL TARTRATE 12.5 MG HALF TABLET
50.0000 mg | ORAL_TABLET | ORAL | Status: AC
  Administered 2021-08-18: 50 mg via ORAL

## 2021-08-18 MED ORDER — METOPROLOL SUCCINATE ER 25 MG PO TB24: 25.0000 mg | ORAL_TABLET | Freq: Every day | ORAL | 0 refills | Status: DC

## 2021-08-18 NOTE — ED Triage Notes (Signed)
Pt states that history of Afib with RVR, today she was feeling dizzy and could feel her heart racing. She called her cardiologist and they told her to come in they did a EKG and found her to be in Afib with RVR. Gave her some Metoprolol and sent her here to be evaluated.

## 2021-08-18 NOTE — Telephone Encounter (Signed)
Patient c/o Palpitations:  High priority if patient c/o lightheadedness, shortness of breath, or chest pain  How long have you had palpitations/irregular HR/ Afib? Are you having the symptoms now? Since 7 am this morning   Are you currently experiencing lightheadedness, SOB or CP? Lightheadedness, dizzy, left hand a little numb   Do you have a history of afib (atrial fibrillation) or irregular heart rhythm? yes  Have you checked your BP or HR? (document readings if available): 120/90 HR 166  Are you experiencing any other symptoms? no

## 2021-08-18 NOTE — Progress Notes (Signed)
Cardiology Office Note:    Date:  08/18/2021   ID:  Alexis Lyons, DOB 1941/09/25, MRN [id number]  PCP:  Virginia Crews, MD   [place]  Cardiologist:  Kate Sable, MD  Advanced Practice Provider:  No care team member to display Electrophysiologist:  None       Referring MD: Virginia Crews, MD   Chief Complaint  Patient presents with   Other    Patient c.o Elevated HR.  Patient stated that when driving over here she started to loose her vision. Meds reviewed verbally with patient.     History of Present Illness:    Alexis Lyons is a 80 y.o. female with a hx of paroxysmal atrial fibrillation, hypertension, who presents due to dizziness.  Patient was at home when she suddenly felt dizzy, short of breath, having palpitations.  She checked her pulse which was around 110.  Called the office and was scheduled to come to the office.  She states feeling okay since her last visit on p.o. Cardizem.  She has had previous history of tachycardia and palpitations, went to the emergency room where IV Cardizem was given with conversion to sinus rhythm.  Prior notes Echocardiogram 04/2021 EF 60 to 65%, impaired relaxation.   Past Medical History:  Diagnosis Date   Amnesia    Aphthae    Atrial fibrillation ([place])    Congestion-fibrosis syndrome    DD (diverticular disease)    GERD (gastroesophageal reflux disease)    Seizures ([place]) 12/2017   passed out due to dehydration    Past Surgical History:  Procedure Laterality Date   CHOLECYSTECTOMY     colonoscopy with polypectomy     8/11 4 mm polyp, 12 mm polyp   COLONOSCOPY WITH PROPOFOL N/A 02/24/2018   Procedure: COLONOSCOPY WITH PROPOFOL;  Surgeon: Manya Silvas, MD;  Location: [facility] ENDOSCOPY;  Service: Endoscopy;  Laterality: N/A;   HERNIA REPAIR Right 2004   inguinal    HERNIA REPAIR     x's 3    Current Medications: Current Meds  Medication Sig   apixaban (ELIQUIS) 5 MG  TABS tablet Take 1 tablet (5 mg total) by mouth 2 (two) times daily.   diltiazem (CARDIZEM CD) 120 MG 24 hr capsule Take 1 capsule (120 mg total) by mouth daily.   Ginseng 100 MG CAPS every other day.    hydrochlorothiazide (HYDRODIURIL) 25 MG tablet TAKE 1 TABLET BY MOUTH ONCE DAILY   zolpidem (AMBIEN) 5 MG tablet TAKE 1/2 TABLET BY MOUTH AT BEDTIME AS NEEDED SLEEP     Allergies:   Codeine   Social History   Socioeconomic History   Marital status: Divorced    Spouse name: Not on file   Number of children: 2   Years of education: 15   Highest education level: Not on file  Occupational History   Occupation: [employer] in [place]  Tobacco Use   Smoking status: Former    Packs/day: 0.50    Years: 15.00    Pack years: 7.50    Types: Cigarettes    Quit date: 12/16/1985    Years since quitting: 35.6   Smokeless tobacco: Never  Vaping Use   Vaping Use: Never used  Substance and Sexual Activity   Alcohol use: Yes    Alcohol/week: 7.0 standard drinks    Types: 7 Glasses of wine per week    Comment: glass of wine with dinner   Drug use: No   Sexual  activity: Not Currently  Other Topics Concern   Not on file  Social History Narrative   Not on file   Social Determinants of Health   Financial Resource Strain: Not on file  Food Insecurity: Not on file  Transportation Needs: Not on file  Physical Activity: Not on file  Stress: Not on file  Social Connections: Not on file     Family History: The patient's family history includes Alcohol abuse in her father; Alzheimer's disease in her mother; Breast cancer in her sister; Cancer in her father and sister.  ROS:   Please see the history of present illness.     All other systems reviewed and are negative.  EKGs/Labs/Other Studies Reviewed:    The following studies were reviewed today:   EKG:  EKG is ordered today.  EKG shows atrial fibrillation with rapid ventricular response, heart rate 139  Recent Labs: 03/14/2021: ALT  18 08/18/2021: BUN 16; Creatinine, Ser 0.78; Hemoglobin 16.4; Platelets 280; Potassium 3.4; Sodium 136  Recent Lipid Panel    Component Value Date/Time   CHOL 196 11/07/2020 0849   TRIG 53 11/07/2020 0849   HDL 78 11/07/2020 0849   CHOLHDL 2.5 11/07/2020 0849   LDLCALC 108 (H) 11/07/2020 0849     Risk Assessment/Calculations:      Physical Exam:    VS:  BP 90/60 (BP Location: Left Arm, Patient Position: Sitting, Cuff Size: Normal)   Pulse (!) 139   Ht 5' 3.5" (1.613 m)   Wt 116 lb (52.6 kg)   SpO2 93%   BMI 20.23 kg/m     Wt Readings from Last 3 Encounters:  08/18/21 116 lb (52.6 kg)  08/18/21 116 lb (52.6 kg)  06/15/21 118 lb (53.5 kg)     GEN:  Well nourished, well developed in no acute distress HEENT: Normal NECK: No JVD; No carotid bruits LYMPHATICS: No lymphadenopathy CARDIAC: RRR, no murmurs, rubs, gallops RESPIRATORY:  Clear to auscultation without rales, wheezing or rhonchi  ABDOMEN: Soft, non-tender, non-distended MUSCULOSKELETAL:  No edema; No deformity  SKIN: Warm and dry NEUROLOGIC:  Alert and oriented x 3 PSYCHIATRIC:  Normal affect   ASSESSMENT:    1. Atrial fibrillation with RVR ([place])   2. Primary hypertension     PLAN:    In order of problems listed above:  History of paroxysmal atrial fibrillation, EKG today showing A. fib RVR CHA2DS2-VASc of 4 (age, htn, gender).  We will give Metroprolol 50 mg x 1.  If heart rates improved to less than 110 bpm, will continue p.o. metoprolol.  If heart rate stays above 110 bpm, will advise patient to go to the emergency room.  Will refer to A. fib/EP for additional input regarding antiarrhythmics and or ablation.  Last echo showed preserved EF Hypertension, BP controlled.  Cardizem, beta-blocker, HCTZ as above  Follow-up in 3 months.   Medication Adjustments/Labs and Tests Ordered: Current medicines are reviewed at length with the patient today.  Concerns regarding medicines are outlined above.  Orders  Placed This Encounter  Procedures   Ambulatory referral to Cardiac Electrophysiology   EKG 12-Lead    Meds ordered this encounter  Medications   metoprolol tartrate (LOPRESSOR) tablet 50 mg     Patient Instructions  Medication Instructions:  Your physician recommends that you continue on your current medications as directed. Please refer to the Current Medication list given to you today.  *If you need a refill on your cardiac medications before your next appointment, please call your  pharmacy*   Lab Work: None ordered If you have labs (blood work) drawn today and your tests are completely normal, you will receive your results only by: [place] (if you have MyChart) OR A paper copy in the mail If you have any lab test that is abnormal or we need to change your treatment, we will call you to review the results.   Testing/Procedures: None ordered   Follow-Up: At [facility], you and your health needs are our priority.  As part of our continuing mission to provide you with exceptional heart care, we have created designated Provider Care Teams.  These Care Teams include your primary Cardiologist (physician) and Advanced Practice Providers (APPs -  Physician Assistants and Nurse Practitioners) who all work together to provide you with the care you need, when you need it.  We recommend signing up for the patient portal called "MyChart".  Sign up information is provided on this After Visit Summary.  MyChart is used to connect with patients for Virtual Visits (Telemedicine).  Patients are able to view lab/test results, encounter notes, upcoming appointments, etc.  Non-urgent messages can be sent to your provider as well.   To learn more about what you can do with MyChart, go to [email or web address].    Your next appointment:    Follow up with EP (Urgent referral placed) Quentin Ore  The format for your next appointment:   In Person  Provider:   Lars Mage,  MD   Other Instructions     Signed, Kate Sable, MD  08/18/2021 4:55 PM    [place]

## 2021-08-18 NOTE — ED Provider Notes (Signed)
[facility] Emergency Department Provider Note   ____________________________________________    I have reviewed the triage vital signs and the nursing notes.   HISTORY  Chief Complaint Atrial Fibrillation     HPI JOHNSIE BIRNER is a 80 y.o. female with history of paroxysmal atrial fibrillation who reports last night she was doing online preparation for her trip to the [place] tomorrow and became very anxious and stressed out at the difficulty she was having.  She reports she believes this caused her to go into A. fib with RVR which is happened to her occasionally.  She reports compliance with her Eliquis and her Cardizem.  Went to her cardiologist office today and they gave her 50 mg of p.o. metoprolol and had her wait 15 minutes, no significant change in her heart rate so referred to the emergency department.  By the time she got here her heart rate was much improved.  She is feeling much better and has no dizziness.  No chest pain.  Past Medical History:  Diagnosis Date   Amnesia    Aphthae    Atrial fibrillation ([place])    Congestion-fibrosis syndrome    DD (diverticular disease)    GERD (gastroesophageal reflux disease)    Seizures ([place]) 12/2017   passed out due to dehydration    Patient Active Problem List   Diagnosis Date Noted   PAF (paroxysmal atrial fibrillation) ([place]) 06/15/2021   Senile purpura ([place]) 06/15/2021   Chronic headache 01/01/2019   Essential hypertension 07/09/2018   Osteopenia 11/06/2017   Seborrheic dermatitis 11/05/2017   Aphthae 05/28/2015   DD (diverticular disease) 05/28/2015   Fatigue 05/28/2015   Hernia, inguinal, right 05/28/2015   Amnesia 05/28/2015   Congestion-fibrosis syndrome 05/28/2015   Atrophy of vagina 05/28/2015   Vaginal lesion 09/21/2008   Acid reflux 06/15/2008   Insomnia 12/17/2004    Past Surgical History:  Procedure Laterality Date   CHOLECYSTECTOMY     colonoscopy with polypectomy      8/11 4 mm polyp, 12 mm polyp   COLONOSCOPY WITH PROPOFOL N/A 02/24/2018   Procedure: COLONOSCOPY WITH PROPOFOL;  Surgeon: Manya Silvas, MD;  Location[facility] ENDOSCOPY;  Service: Endoscopy;  Laterality: N/A;   HERNIA REPAIR Right 2004   inguinal    HERNIA REPAIR     x's 3    Prior to Admission medications   Medication Sig Start Date End Date Taking? Authorizing Provider  metoprolol succinate (TOPROL XL) 25 MG 24 hr tablet Take 1 tablet (25 mg total) by mouth daily. 08/18/21 08/18/22 Yes Lavonia Drafts, MD  apixaban (ELIQUIS) 5 MG TABS tablet Take 1 tablet (5 mg total) by mouth 2 (two) times daily. 06/26/21   Kate Sable, MD  diltiazem (CARDIZEM CD) 120 MG 24 hr capsule Take 1 capsule (120 mg total) by mouth daily. 07/27/21 10/25/21  Kate Sable, MD  Ginseng 100 MG CAPS every other day.  06/08/09   [provider]  hydrochlorothiazide (HYDRODIURIL) 25 MG tablet TAKE 1 TABLET BY MOUTH ONCE DAILY 02/22/21   Virginia Crews, MD  zolpidem (AMBIEN) 5 MG tablet TAKE 1/2 TABLET BY MOUTH AT BEDTIME AS NEEDED SLEEP 06/27/21   Brita Romp Dionne Bucy, MD     Allergies Codeine  Family History  Problem Relation Age of Onset   Alzheimer's disease Mother    Alcohol abuse Father    Cancer Father        liver cancer   Cancer Sister  breast   Breast cancer Sister     Social History Social History   Tobacco Use   Smoking status: Former    Packs/day: 0.50    Years: 15.00    Pack years: 7.50    Types: Cigarettes    Quit date: 12/16/1985    Years since quitting: 35.6   Smokeless tobacco: Never  Vaping Use   Vaping Use: Never used  Substance Use Topics   Alcohol use: Yes    Alcohol/week: 7.0 standard drinks    Types: 7 Glasses of wine per week    Comment: glass of wine with dinner   Drug use: No    Review of Systems  Constitutional: No fever/chills Eyes: No visual changes.  ENT: No sore throat. Cardiovascular: Palpitations Respiratory: Denies shortness  of breath. Gastrointestinal: No abdominal pain.  No nausea, no vomiting.   Genitourinary: Negative for dysuria. Musculoskeletal: Negative for back pain. Skin: Negative for rash. Neurological: Negative for headaches or weakness   ____________________________________________   PHYSICAL EXAM:  VITAL SIGNS: ED Triage Vitals  Enc Vitals Group     BP 08/18/21 1323 112/67     Pulse Rate 08/18/21 1323 (!) 106     Resp 08/18/21 1323 18     Temp 08/18/21 1323 98.4 F (36.9 C)     Temp Source 08/18/21 1239 Oral     SpO2 08/18/21 1323 96 %     Weight 08/18/21 1239 52.6 kg (116 lb)     Height 08/18/21 1239 1.613 m (5' 3.5")     Head Circumference --      Peak Flow --      Pain Score 08/18/21 1239 0     Pain Loc --      Pain Edu? --      Excl. in [place]? --     Constitutional: Alert and oriented. No acute distress. Pleasant and interactive  Nose: No congestion/rhinnorhea. Mouth/Throat: Mucous membranes are moist.   Neck:  Painless ROM Cardiovascular: Normal rate, irregular rhythm. Grossly normal heart sounds.  Good peripheral circulation. Respiratory: Normal respiratory effort.  No retractions. Lungs CTAB. Gastrointestinal: Soft and nontender. No distention.    Musculoskeletal: No lower extremity tenderness nor edema.  Warm and well perfused Neurologic:  Normal speech and language. No gross focal neurologic deficits are appreciated.  Skin:  Skin is warm, dry and intact. No rash noted. Psychiatric: Mood and affect are normal. Speech and behavior are normal.  ____________________________________________   LABS (all labs ordered are listed, but only abnormal results are displayed)  Labs Reviewed  BASIC METABOLIC PANEL - Abnormal; Notable for the following components:      Result Value   Potassium 3.4 (*)    Glucose, Bld 174 (*)    All other components within normal limits  CBC - Abnormal; Notable for the following components:   RBC 5.47 (*)    Hemoglobin 16.4 (*)    HCT 47.9  (*)    All other components within normal limits   ____________________________________________  EKG  ED ECG REPORT I, Lavonia Drafts, the attending physician, personally viewed and interpreted this ECG.  Date: 08/18/2021  Rhythm: Atrial fibrillation QRS Axis: normal Intervals: Abnormal ST/T Wave abnormalities: normal Narrative Interpretation: Rate of 106  ____________________________________________  RADIOLOGY  Chest x-ray reviewed by me, no acute abnormality ____________________________________________   PROCEDURES  Procedure(s) performed: No  Procedures   Critical Care performed: No ____________________________________________   INITIAL IMPRESSION / ASSESSMENT AND PLAN / ED COURSE  Pertinent labs &  imaging results that were available during my care of the patient were reviewed by me and considered in my medical decision making (see chart for details).   Patient feeling better in the emergency department.  Had an episode of atrial fibrillation with RVR, given metoprolol at the cardiologist office which now seems to have taken effect.  Her heart rate is only 106 upon arrival and on my exam is 87.  I believe that she has atrial fibrillation now.  Lab work is overall quite reassuring and unremarkable.  Discussed with Dr. Sophronia Simas of cardiology who agrees that patient can take metoprolol in addition to Cardizem, will give her a lower dose than she received today.  Instructed her to stop if dizziness/low heart rate.  Patient reports she is asymptomatic and feels well, appropriate for discharge at this time   ____________________________________________   FINAL CLINICAL IMPRESSION(S) / ED DIAGNOSES  Final diagnoses:  Atrial fibrillation with rapid ventricular response ([place])        Note:  This document was prepared using Dragon voice recognition software and may include unintentional dictation errors.    Lavonia Drafts, MD 08/18/21 [phone]

## 2021-08-18 NOTE — Telephone Encounter (Signed)
Called patient and offered her an appointment of 1100 today. She stated that her pulse is now 110 but she is feeling a lot of palpitations. Patient was very grateful for the call back, and confirmed she will be here at 1100.

## 2021-08-18 NOTE — ED Notes (Signed)
Pt presents from cardiology office, states she had an episode of A Fib at home. They administered a beta blocker in the office, per the pt, and referred to ED. Pt denies chest pain, sob, dizziness - states "I actually feel much better now".

## 2021-08-18 NOTE — Patient Instructions (Signed)
Medication Instructions:  Your physician recommends that you continue on your current medications as directed. Please refer to the Current Medication list given to you today.  *If you need a refill on your cardiac medications before your next appointment, please call your pharmacy*   Lab Work: None ordered If you have labs (blood work) drawn today and your tests are completely normal, you will receive your results only by: [place] (if you have MyChart) OR A paper copy in the mail If you have any lab test that is abnormal or we need to change your treatment, we will call you to review the results.   Testing/Procedures: None ordered   Follow-Up: At [facility], you and your health needs are our priority.  As part of our continuing mission to provide you with exceptional heart care, we have created designated Provider Care Teams.  These Care Teams include your primary Cardiologist (physician) and Advanced Practice Providers (APPs -  Physician Assistants and Nurse Practitioners) who all work together to provide you with the care you need, when you need it.  We recommend signing up for the patient portal called "MyChart".  Sign up information is provided on this After Visit Summary.  MyChart is used to connect with patients for Virtual Visits (Telemedicine).  Patients are able to view lab/test results, encounter notes, upcoming appointments, etc.  Non-urgent messages can be sent to your provider as well.   To learn more about what you can do with MyChart, go to [email or web address].    Your next appointment:    Follow up with EP (Urgent referral placed) Quentin Ore  The format for your next appointment:   In Person  Provider:   Lars Mage, MD   Other Instructions

## 2021-08-22 ENCOUNTER — Telehealth: Payer: Self-pay | Admitting: Cardiology

## 2021-08-22 NOTE — Telephone Encounter (Signed)
Pt c/o medication issue:  1. Name of Medication: Toprol  2. How are you currently taking this medication (dosage and times per day)? 25 mg 1 tablet daily  3. Are you having a reaction (difficulty breathing--STAT)? dizzy  4. What is your medication issue? Been dizzy since taking on Sunday

## 2021-08-24 ENCOUNTER — Telehealth: Payer: Self-pay

## 2021-08-24 DIAGNOSIS — L738 Other specified follicular disorders: Secondary | ICD-10-CM | POA: Diagnosis not present

## 2021-08-24 DIAGNOSIS — L400 Psoriasis vulgaris: Secondary | ICD-10-CM | POA: Diagnosis not present

## 2021-08-24 DIAGNOSIS — D485 Neoplasm of uncertain behavior of skin: Secondary | ICD-10-CM | POA: Diagnosis not present

## 2021-08-24 NOTE — Telephone Encounter (Signed)
Patient called back to add she is experiencing dizzyness, blurred vision and lightheadness. Please call to discuss.

## 2021-08-24 NOTE — Telephone Encounter (Signed)
Agree with recs from Dr Garen Lah. May just be taking a few days for the Toprol to wash out of her system if she stopped it on 9/6. Her HR and BP are both normal off of the Toprol. She is already on the lowest dose of diltiazem 24 hr capsule which she has been taking for 5 months. EP will need to evaluate for further recs.

## 2021-08-24 NOTE — Telephone Encounter (Signed)
Needs provider visit, not nurse visit.  Can see if anyone has openings or if I have any things labelled ok for 20 or ok to double book on Monday maybe. 820 tomorrow AM is wrongly scheduled for procedure so could maybe use that slot

## 2021-08-24 NOTE — Telephone Encounter (Signed)
Noted  

## 2021-08-24 NOTE — Telephone Encounter (Signed)
Copied from [place] [id number]. Topic: Appointment Scheduling - Scheduling Inquiry for Clinic >> Aug 24, 2021  3:50 PM Rayann Heman wrote: Reason for CRM: Pt called and stated that she was in the emergency room because she was in afib. Patient would like to know if she can schedule a nurse visit before Wednesday so someone can listen to her heart and make sure she can fly out on Wednesday 08/30/21

## 2021-08-24 NOTE — Telephone Encounter (Signed)
Spoke with patient and informed her of Dr. Thereasa Solo recommendation as noted below:  Patient can try taking half a pill/12.5 mg daily to see if symptoms improve.  If she still feels dizzy despite taking half a pill/12.5 mg daily, she can stop Toprol-XL.  Recommend she keep follow-up appointment with EP.    Patient stated that she is still experiencing dizziness, and blurred vision, and light headedness. She states that she usually feels that way in Afib, but that she knows when she is in Afib and is sure she is not.  She states that she thinks it is the Toprol XL and Cardizem combination. She has not taken the Toprol XL since 08/22/21 and is still feeling this way. I offered her an earlier appointment with an EP in [place] on Tues 07/29/21, however patient decided she will wait for her appointment with Dr. Quentin Ore on 09/13/21.   Her BP was in the 120's/ 60's today and HR was 59. She stated that she is well hydrated.   I advised that she reach out to her PCP if her symptoms do not improve. Will also route to PharmD for any input on the new addition of the Toprol XL, and their thoughts on her symptoms.

## 2021-08-25 ENCOUNTER — Ambulatory Visit: Payer: Self-pay

## 2021-08-25 NOTE — Telephone Encounter (Signed)
Dr. B, I spoke to the patient and recommended that due to her symptoms, she should go back to the ER to be evaluated. She declined. She reports that she was seen on 08/18/21, and was Rx/d Metoprolol '25mg'$  daily. She reports that she felt worse while on the medication. She stopped taking the medication on Monday (9/6). She reports that her BP today was 121/74 HR was 63.   She also mentions that she called her cardiogist and they recommended that she f/u with PCP about her symptoms. I scheduled her an appt w/you on 9/12. She declined UC and ER. I made sure that the patient was aware that she may be asked to be seen at the ER after the provider reviews the message. Please advise if you want her to keep this appt on Monday. Thanks!

## 2021-08-25 NOTE — Telephone Encounter (Signed)
I like the plan to see me on Monday. Ok to keep appt

## 2021-08-25 NOTE — Telephone Encounter (Signed)
Patient called and says that she's been having dizziness, blurred vision, lightheadedness since starting on Metoprolol last Friday in the ED. She says she went because she was in rapid A-Fib and they told her to take Metoprolol tabs after giving IV Metoprolol in the ED. She says she stopped taking it on Wednesday because of the symptoms. She says yesterday she called the cardiologist and was told that the medication is out of her system and to call her PCP for the symptoms. She says today she's still feeling the same way and extremely tired. She says she was walking today and felt a dull pain in her chest like a gas pain that lasted a few minutes then went away, so she says she came home. No chest pain at present. She says she's able to walk, but is so weak and tired. She says she checked her pulse and it was 59. She says she feels the way she felt when she was in the a-fib. I advised to go to the ED, she says I don't want to go back there, will you see if a doctor could see me in the office. I called the office and spoke to [place], [facility] who says she will need to go to the ED. I asked Arbie Cookey if she could talk to the patient, she says she will find one of the nurses to speak to her and placed me on hold. I spoke to Ray, CMA who asked to speak to the patient, the call was connected successfully.   Reason for Disposition  Patient sounds very sick or weak to the triager  Answer Assessment - Initial Assessment Questions 1. DESCRIPTION: "Describe your dizziness."     Lightheaded 2. LIGHTHEADED: "Do you feel lightheaded?" (e.g., somewhat faint, woozy, weak upon standing)     Yes 3. VERTIGO: "Do you feel like either you or the room is spinning or tilting?" (i.e. vertigo)     No 4. SEVERITY: "How bad is it?"  "Do you feel like you are going to faint?" "Can you stand and walk?"   - MILD: Feels slightly dizzy, but walking normally.   - MODERATE: Feels unsteady when walking, but not falling; interferes with  normal activities (e.g., school, work).   - SEVERE: Unable to walk without falling, or requires assistance to walk without falling; feels like passing out now.      Moderate 5. ONSET:  "When did the dizziness begin?"     Over the weekend 6. AGGRAVATING FACTORS: "Does anything make it worse?" (e.g., standing, change in head position)     Nothing, it's constant 7. HEART RATE: "Can you tell me your heart rate?" "How many beats in 15 seconds?"  (Note: not all patients can do this)       59 8. CAUSE: "What do you think is causing the dizziness?"     I don't know 9. RECURRENT SYMPTOM: "Have you had dizziness before?" If Yes, ask: "When was the last time?" "What happened that time?"     No 10. OTHER SYMPTOMS: "Do you have any other symptoms?" (e.g., fever, chest pain, vomiting, diarrhea, bleeding)      Blurred vision, ache in chest (like gas) this morning while walking, really tired 11. PREGNANCY: "Is there any chance you are pregnant?" "When was your last menstrual period?"       N/A  Protocols used: Dizziness - Lightheadedness-A-AH

## 2021-08-28 ENCOUNTER — Ambulatory Visit (INDEPENDENT_AMBULATORY_CARE_PROVIDER_SITE_OTHER): Payer: Medicare HMO | Admitting: Family Medicine

## 2021-08-28 ENCOUNTER — Encounter: Payer: Self-pay | Admitting: Family Medicine

## 2021-08-28 ENCOUNTER — Other Ambulatory Visit: Payer: Self-pay

## 2021-08-28 VITALS — BP 106/70 | HR 73 | Temp 98.3°F | Resp 16 | Wt 117.0 lb

## 2021-08-28 DIAGNOSIS — I480 Paroxysmal atrial fibrillation: Secondary | ICD-10-CM

## 2021-08-28 DIAGNOSIS — I952 Hypotension due to drugs: Secondary | ICD-10-CM | POA: Insufficient documentation

## 2021-08-28 NOTE — Assessment & Plan Note (Signed)
New problem Suspect HCTZ, Dilt and Metop was a little too much She is symptomatically lightheaded and BP is low normal today She has stopped Metop and will only use prn for RVR as below Continue Diltiazem at current dose Cut HCTZ in half to 12.5 mg daily F/u with home BPs in 1 week and reconsider doses

## 2021-08-28 NOTE — Progress Notes (Signed)
Established patient visit   Patient: Alexis Lyons   DOB: October 03, 1941   80 y.o. Female  MRN: [id number] Visit Date: 08/28/2021  Today's healthcare provider: Lavon Paganini, MD   Chief Complaint  Patient presents with   Atrial Fibrillation   Subjective    Atrial Fibrillation Symptoms include dizziness and weakness. Symptoms are negative for chest pain, palpitations and shortness of breath. Past medical history includes atrial fibrillation.    Follow up ER visit  Patient was seen in ER for A fib on 08/18/21. She was treated for A fib. Treatment for this included take metoprolol in addition to Cardizem, will give her a lower dose than she received today. Instructed her to stop if dizziness/low heart rate.  She reports fair compliance with treatment. Patient reported dizziness, blurred vision, fatigue and lightheadedness on 08/25/21 since starting Metoprolol. Patient D/C metoprolol on 08/22/21. This morning she was in A fib. She reports this condition is Unchanged.  She continues taking HCTZ and her home reading range 120s/80s. Her heart rate is around 59 bpm. She tries to fight the feeling of fatigue by walking but she hasn't in the last few days because of her extreme fatigue.   She has an appointment with cardiology, Dr. Quentin Ore in 2 wks.  -----------------------------------------------------------------------------------------    Medications: Outpatient Medications Prior to Visit  Medication Sig   apixaban (ELIQUIS) 5 MG TABS tablet Take 1 tablet (5 mg total) by mouth 2 (two) times daily.   diltiazem (CARDIZEM CD) 120 MG 24 hr capsule Take 1 capsule (120 mg total) by mouth daily.   Ginseng 100 MG CAPS every other day.    hydrochlorothiazide (HYDRODIURIL) 25 MG tablet TAKE 1 TABLET BY MOUTH ONCE DAILY   zolpidem (AMBIEN) 5 MG tablet TAKE 1/2 TABLET BY MOUTH AT BEDTIME AS NEEDED SLEEP   metoprolol succinate (TOPROL XL) 25 MG 24 hr tablet Take 1 tablet (25 mg total) by  mouth daily. (Patient not taking: Reported on 08/28/2021)   No facility-administered medications prior to visit.    Review of Systems  Constitutional:  Positive for activity change and fatigue. Negative for appetite change, chills and fever.  HENT:  Negative for ear pain, sinus pressure, sinus pain and sore throat.   Eyes:  Positive for visual disturbance. Negative for pain.  Respiratory:  Negative for cough, chest tightness, shortness of breath and wheezing.   Cardiovascular:  Negative for chest pain, palpitations and leg swelling.  Gastrointestinal:  Negative for abdominal pain, diarrhea, nausea and vomiting.  Genitourinary:  Negative for flank pain, frequency, pelvic pain and urgency.  Musculoskeletal:  Negative for back pain, myalgias and neck pain.  Neurological:  Positive for dizziness, weakness and light-headedness. Negative for numbness and headaches.   Last CBC Lab Results  Component Value Date   WBC 6.8 08/18/2021   HGB 16.4 (H) 08/18/2021   HCT 47.9 (H) 08/18/2021   MCV 87.6 08/18/2021   MCH 30.0 08/18/2021   RDW 12.3 08/18/2021   PLT 280 [id number]   Last metabolic panel Lab Results  Component Value Date   GLUCOSE 174 (H) 08/18/2021   NA 136 08/18/2021   K 3.4 (L) 08/18/2021   CL 101 08/18/2021   CO2 25 08/18/2021   BUN 16 08/18/2021   CREATININE 0.78 08/18/2021   GFRNONAA >60 08/18/2021   GFRAA 74 11/07/2020   CALCIUM 9.3 08/18/2021   PHOS 3.2 12/23/2017   PROT 7.5 03/14/2021   ALBUMIN 4.6 03/14/2021   LABGLOB 2.4 11/07/2020  AGRATIO 1.9 11/07/2020   BILITOT 1.4 (H) 03/14/2021   ALKPHOS 58 03/14/2021   AST 24 03/14/2021   ALT 18 03/14/2021   ANIONGAP 10 08/18/2021   Last lipids Lab Results  Component Value Date   CHOL 196 11/07/2020   HDL 78 11/07/2020   LDLCALC 108 (H) 11/07/2020   TRIG 53 11/07/2020   CHOLHDL 2.5 11/07/2020       Objective    BP 106/70 (BP Location: Left Arm, Patient Position: Sitting, Cuff Size: Normal)   Pulse 73    Temp 98.3 F (36.8 C) (Oral)   Resp 16   Wt 117 lb (53.1 kg)   SpO2 99%   BMI 20.40 kg/m  BP Readings from Last 3 Encounters:  08/28/21 106/70  08/18/21 99/71  08/18/21 90/60   Wt Readings from Last 3 Encounters:  08/28/21 117 lb (53.1 kg)  08/18/21 116 lb (52.6 kg)  08/18/21 116 lb (52.6 kg)      Physical Exam Vitals reviewed.  Constitutional:      General: She is not in acute distress.    Appearance: Normal appearance. She is well-developed. She is not diaphoretic.  HENT:     Head: Normocephalic and atraumatic.  Eyes:     General: No scleral icterus.    Conjunctiva/sclera: Conjunctivae normal.  Neck:     Thyroid: No thyromegaly.  Cardiovascular:     Rate and Rhythm: Normal rate. Rhythm irregularly irregular.     Pulses: Normal pulses.     Heart sounds: Normal heart sounds. No murmur heard.    Comments: A fib  Pulmonary:     Effort: Pulmonary effort is normal. No respiratory distress.     Breath sounds: Normal breath sounds. No wheezing, rhonchi or rales.  Musculoskeletal:     Cervical back: Neck supple.     Right lower leg: No edema.     Left lower leg: No edema.  Lymphadenopathy:     Cervical: No cervical adenopathy.  Skin:    General: Skin is warm and dry.     Findings: No rash.  Neurological:     Mental Status: She is alert and oriented to person, place, and time. Mental status is at baseline.  Psychiatric:        Mood and Affect: Mood normal.        Behavior: Behavior normal.      No results found for any visits on 08/28/21.  Assessment & Plan     Problem List Items Addressed This Visit       Cardiovascular and Mediastinum   PAF (paroxysmal atrial fibrillation) (HCC) - Primary    Chronic and uncontrolled Recent ED visit for a fib with RVR Not in RVR today Will take diltiazem as Rx'd and metop only prn Upcoming EP appt  Long discussion about PAF vs afib with RVR      Hypotension due to drugs    New problem Suspect HCTZ, Dilt and Metop  was a little too much She is symptomatically lightheaded and BP is low normal today She has stopped Metop and will only use prn for RVR as below Continue Diltiazem at current dose Cut HCTZ in half to 12.5 mg daily F/u with home BPs in 1 week and reconsider doses        Return in about 4 months (around 12/28/2021) for as scheduled.      Total time spent on today's visit was greater than 30 minutes, including both face-to-face time and nonface-to-face time personally spent  on review of chart (labs and imaging), discussing labs and goals, discussing further work-up, treatment options, referrals to specialist if needed, reviewing outside records of pertinent, answering patient's questions, and coordinating care.    I,[name],acting as a Education administrator for Lavon Paganini, MD.,have documented all relevant documentation on the behalf of Lavon Paganini, MD,as directed by  Lavon Paganini, MD while in the presence of Lavon Paganini, MD.  I, Lavon Paganini, MD, have reviewed all documentation for this visit. The documentation on 08/28/21 for the exam, diagnosis, procedures, and orders are all accurate and complete.   Deavion Dobbs, Dionne Bucy, MD, MPH [facility]

## 2021-08-28 NOTE — Assessment & Plan Note (Signed)
Chronic and uncontrolled Recent ED visit for a fib with RVR Not in RVR today Will take diltiazem as Rx'd and metop only prn Upcoming EP appt  Long discussion about PAF vs afib with RVR

## 2021-08-28 NOTE — Patient Instructions (Signed)
Cut HCTZ in half for now  Let me know in 1 week how blood pressure and symptoms are  Bring metoprolol with you to use in case of fast rate >100

## 2021-09-13 ENCOUNTER — Encounter: Payer: Self-pay | Admitting: Cardiology

## 2021-09-13 ENCOUNTER — Other Ambulatory Visit: Payer: Self-pay

## 2021-09-13 ENCOUNTER — Ambulatory Visit: Payer: Medicare HMO | Admitting: Cardiology

## 2021-09-13 VITALS — BP 146/96 | HR 59 | Ht 63.5 in | Wt 121.0 lb

## 2021-09-13 DIAGNOSIS — I480 Paroxysmal atrial fibrillation: Secondary | ICD-10-CM

## 2021-09-13 DIAGNOSIS — I4891 Unspecified atrial fibrillation: Secondary | ICD-10-CM | POA: Diagnosis not present

## 2021-09-13 DIAGNOSIS — I10 Essential (primary) hypertension: Secondary | ICD-10-CM

## 2021-09-13 NOTE — Patient Instructions (Addendum)
Medication Instructions:  Your physician has recommended you make the following change in your medication:    STOP taking diltiazem  *If you need a refill on your cardiac medications before your next appointment, please call your pharmacy*  Lab Work: None ordered. If you have labs (blood work) drawn today and your tests are completely normal, you will receive your results only by: [place] (if you have MyChart) OR A paper copy in the mail If you have any lab test that is abnormal or we need to change your treatment, we will call you to review the results.  Testing/Procedures: Your physician has recommended that you have an ablation. Catheter ablation is a medical procedure used to treat some cardiac arrhythmias (irregular heartbeats). During catheter ablation, a long, thin, flexible tube is put into a blood vessel in your groin (upper thigh), or neck. This tube is called an ablation catheter. It is then guided to your heart through the blood vessel. Radio frequency waves destroy small areas of heart tissue where abnormal heartbeats may cause an arrhythmia to start. Please see the instruction sheet given to you today.  Follow-Up: At [facility], you and your health needs are our priority.  As part of our continuing mission to provide you with exceptional heart care, we have created designated Provider Care Teams.  These Care Teams include your primary Cardiologist (physician) and Advanced Practice Providers (APPs -  Physician Assistants and Nurse Practitioners) who all work together to provide you with the care you need, when you need it.   Your next appointment:     Afib ablation 11/03/2021   You will follow up with the afib clinic 4 weeks after your procedure.   Your physician wants you to follow-up in: 3 months with Dr. Quentin Ore post procedure.    Cardiac Ablation Cardiac ablation is a procedure to destroy, or ablate, a small amount of heart tissue in very specific places.  The heart has many electrical connections. Sometimes these connections are abnormal and can cause the heart to beat very fast or irregularly. Ablating some of the areas that cause problems can improve the heart's rhythm or return it to normal. Ablation may be done for people who: Have Wolff-Parkinson-White syndrome. Have fast heart rhythms (tachycardia). Have taken medicines for an abnormal heart rhythm (arrhythmia) that were not effective or caused side effects. Have a high-risk heartbeat that may be life-threatening. During the procedure, a small incision is made in the neck or the groin, and a long, thin tube (catheter) is inserted into the incision and moved to the heart. Small devices (electrodes) on the tip of the catheter will send out electrical currents. A type of X-ray (fluoroscopy) will be used to help guide the catheter and to provide images of the heart. Tell a health care provider about: Any allergies you have. All medicines you are taking, including vitamins, herbs, eye drops, creams, and over-the-counter medicines. Any problems you or family members have had with anesthetic medicines. Any blood disorders you have. Any surgeries you have had. Any medical conditions you have, such as kidney failure. Whether you are pregnant or may be pregnant. What are the risks? Generally, this is a safe procedure. However, problems may occur, including: Infection. Bruising and bleeding at the catheter insertion site. Bleeding into the chest, especially into the sac that surrounds the heart. This is a serious complication. Stroke or blood clots. Damage to nearby structures or organs. Allergic reaction to medicines or dyes. Need for a permanent pacemaker  if the normal electrical system is damaged. A pacemaker is a small computer that sends electrical signals to the heart and helps your heart beat normally. The procedure not being fully effective. This may not be recognized until months later.  Repeat ablation procedures are sometimes done. What happens before the procedure? Medicines Ask your health care provider about: Changing or stopping your regular medicines. This is especially important if you are taking diabetes medicines or blood thinners. Taking medicines such as aspirin and ibuprofen. These medicines can thin your blood. Do not take these medicines unless your health care provider tells you to take them. Taking over-the-counter medicines, vitamins, herbs, and supplements. General instructions Follow instructions from your health care provider about eating or drinking restrictions. Plan to have someone take you home from the hospital or clinic. If you will be going home right after the procedure, plan to have someone with you for 24 hours. Ask your health care provider what steps will be taken to prevent infection. What happens during the procedure?  An IV will be inserted into one of your veins. You will be given a medicine to help you relax (sedative). The skin on your neck or groin will be numbed. An incision will be made in your neck or your groin. A needle will be inserted through the incision and into a large vein in your neck or groin. A catheter will be inserted into the needle and moved to your heart. Dye may be injected through the catheter to help your surgeon see the area of the heart that needs treatment. Electrical currents will be sent from the catheter to ablate heart tissue in desired areas. There are three types of energy that may be used to do this: Heat (radiofrequency energy). Laser energy. Extreme cold (cryoablation). When the tissue has been ablated, the catheter will be removed. Pressure will be held on the insertion area to prevent a lot of bleeding. A bandage (dressing) will be placed over the insertion area. The exact procedure may vary among health care providers and hospitals. What happens after the procedure? Your blood pressure, heart  rate, breathing rate, and blood oxygen level will be monitored until you leave the hospital or clinic. Your insertion area will be monitored for bleeding. You will need to lie still for a few hours to ensure that you do not bleed from the insertion area. Do not drive for 24 hours or as long as told by your health care provider. Summary Cardiac ablation is a procedure to destroy, or ablate, a small amount of heart tissue using an electrical current. This procedure can improve the heart rhythm or return it to normal. Tell your health care provider about any medical conditions you may have and all medicines you are taking to treat them. This is a safe procedure, but problems may occur. Problems may include infection, bruising, damage to nearby organs or structures, or allergic reactions to medicines. Follow your health care provider's instructions about eating and drinking before the procedure. You may also be told to change or stop some of your medicines. After the procedure, do not drive for 24 hours or as long as told by your health care provider. This information is not intended to replace advice given to you by your health care provider. Make sure you discuss any questions you have with your health care provider. Document Revised: 10/12/2019 Document Reviewed: 10/12/2019 Elsevier Patient Education  [place].

## 2021-09-13 NOTE — Progress Notes (Signed)
Electrophysiology Office Note:    Date:  09/13/2021   ID:  ALEXIANNA Lyons, DOB 1941/04/07, MRN [id number]  PCP:  Virginia Crews, MD  [facility] Cardiologist:  Kate Sable, MD  [facility] Electrophysiologist:  Vickie Epley, MD   Referring MD: Kate Sable, MD   Chief Complaint: AF  History of Present Illness:    Alexis Lyons is a 80 y.o. female who presents for an evaluation of AF at the request of Dr Garen Lah. Their medical history includes GERD, diverticulosis and AF.   She last saw Dr Brita Romp on 08/28/2021 in follow up. She presented to the ER with AF on 08/18/2021. She has previously used diltiazem and metoprolol to help manage her AF but the metoprolol is causing dizziness and lightheadedness. She has now transitioned to taking metoprolol only as needed. She is having 3-4 episodes of AF per month. Episodes last hours at a time and are highly symptomatic. She takes eliquis for stroke prophylaxis without any problems with bleeding. She is interested in a rhythm control strategy.   Past Medical History:  Diagnosis Date   Amnesia    Aphthae    Atrial fibrillation ([place])    Congestion-fibrosis syndrome    DD (diverticular disease)    GERD (gastroesophageal reflux disease)    Seizures ([place]) 12/2017   passed out due to dehydration    Past Surgical History:  Procedure Laterality Date   CHOLECYSTECTOMY     colonoscopy with polypectomy     8/11 4 mm polyp, 12 mm polyp   COLONOSCOPY WITH PROPOFOL N/A 02/24/2018   Procedure: COLONOSCOPY WITH PROPOFOL;  Surgeon: Manya Silvas, MD;  Location: [facility] ENDOSCOPY;  Service: Endoscopy;  Laterality: N/A;   HERNIA REPAIR Right 2004   inguinal    HERNIA REPAIR     x's 3    Current Medications: Current Meds  Medication Sig   apixaban (ELIQUIS) 5 MG TABS tablet Take 1 tablet (5 mg total) by mouth 2 (two) times daily.   Ginseng 100 MG CAPS every other day.    zolpidem (AMBIEN) 5 MG tablet TAKE 1/2  TABLET BY MOUTH AT BEDTIME AS NEEDED SLEEP   [DISCONTINUED] diltiazem (CARDIZEM CD) 120 MG 24 hr capsule Take 1 capsule (120 mg total) by mouth daily.     Allergies:   Codeine   Social History   Socioeconomic History   Marital status: Divorced    Spouse name: Not on file   Number of children: 2   Years of education: 15   Highest education level: Not on file  Occupational History   Occupation: [employer] in [place] Use   Smoking status: Former    Packs/day: 0.50    Years: 15.00    Pack years: 7.50    Types: Cigarettes    Quit date: 12/16/1985    Years since quitting: 35.7   Smokeless tobacco: Never  Vaping Use   Vaping Use: Never used  Substance and Sexual Activity   Alcohol use: Yes    Alcohol/week: 7.0 standard drinks    Types: 7 Glasses of wine per week    Comment: glass of wine with dinner   Drug use: No   Sexual activity: Not Currently  Other Topics Concern   Not on file  Social History Narrative   Not on file   Social Determinants of Health   Financial Resource Strain: Not on file  Food Insecurity: Not on file  Transportation Needs: Not on file  Physical Activity:  Not on file  Stress: Not on file  Social Connections: Not on file     Family History: The patient's family history includes Alcohol abuse in her father; Alzheimer's disease in her mother; Breast cancer in her sister; Cancer in her father and sister.  ROS:   Please see the history of present illness.    All other systems reviewed and are negative.  EKGs/Labs/Other Studies Reviewed:    The following studies were reviewed today:   04/20/2021 Echo personally reviewed EF normal, 60% RV normal Mild-moderate TR       EKG:  The ekg ordered today demonstrates sinus bradycardia  Recent Labs: 03/14/2021: ALT 18 08/18/2021: BUN 16; Creatinine, Ser 0.78; Hemoglobin 16.4; Platelets 280; Potassium 3.4; Sodium 136  Recent Lipid Panel    Component Value Date/Time   CHOL 196 11/07/2020 0849    TRIG 53 11/07/2020 0849   HDL 78 11/07/2020 0849   CHOLHDL 2.5 11/07/2020 0849   LDLCALC 108 (H) 11/07/2020 0849    Physical Exam:    VS:  BP (!) 146/96 (BP Location: Left Arm, Patient Position: Sitting, Cuff Size: Normal)   Pulse (!) 59   Ht 5' 3.5" (1.613 m)   Wt 121 lb (54.9 kg)   SpO2 96%   BMI 21.10 kg/m     Wt Readings from Last 3 Encounters:  09/13/21 121 lb (54.9 kg)  08/28/21 117 lb (53.1 kg)  08/18/21 116 lb (52.6 kg)     GEN:  Well nourished, well developed in no acute distress. Appears younger than stated age. HEENT: Normal NECK: No JVD; No carotid bruits LYMPHATICS: No lymphadenopathy CARDIAC: RRR, no murmurs, rubs, gallops RESPIRATORY:  Clear to auscultation without rales, wheezing or rhonchi  ABDOMEN: Soft, non-tender, non-distended MUSCULOSKELETAL:  No edema; No deformity  SKIN: Warm and dry NEUROLOGIC:  Alert and oriented x 3 PSYCHIATRIC:  Normal affect   ASSESSMENT:    1. Paroxysmal atrial fibrillation (HCC)   2. Primary hypertension   3. Atrial fibrillation, unspecified type ([place])    PLAN:    In order of problems listed above:   1. Paroxysmal atrial fibrillation (HCC) Highly symptomatic paroxysms of AF. I discussed the options for managing her AF including continued conservative management, rhythm control using antiarrhythmic therapy and ablation therapy. I discussed the efficacy of each option. She would like to proceed with scheduling an ablation. She will need a CT prior to the procedure.  Risk, benefits, and alternatives to EP study and radiofrequency ablation for afib were also discussed in detail today. These risks include but are not limited to stroke, bleeding, vascular damage, tamponade, perforation, damage to the esophagus, lungs, and other structures, pulmonary vein stenosis, worsening renal function, and death. The patient understands these risk and wishes to proceed.  We will therefore proceed with catheter ablation at the next  available time.  Carto, ICE, anesthesia are requested for the procedure.  Will also obtain CT PV protocol prior to the procedure to exclude LAA thrombus and further evaluate atrial anatomy.   2. Primary hypertension Controlled. She is having dizziness and intermittent low blood pressures on diltiazem. She can stop this medication now and keep a close eye on her Bps at home. I have encouraged to check her BP daily. If the Bps increase, would consider adding alternative anti-hypertensive such as amlodipine.  3. Atrial fibrillation, unspecified type ([place]) See above.       Total time spent with patient today 65 minutes. This includes reviewing records, evaluating the patient  and coordinating care.  Medication Adjustments/Labs and Tests Ordered: Current medicines are reviewed at length with the patient today.  Concerns regarding medicines are outlined above.  Orders Placed This Encounter  Procedures   CT CARDIAC MORPH/PULM VEIN W/CM&W/O CA SCORE   EKG 12-Lead   No orders of the defined types were placed in this encounter.    Signed, Hilton Cork. Quentin Ore, MD, [facility], [facility] 09/13/2021 9:07 PM    Electrophysiology [facility]

## 2021-09-14 ENCOUNTER — Telehealth: Payer: Self-pay

## 2021-09-14 ENCOUNTER — Ambulatory Visit: Payer: Self-pay | Admitting: *Deleted

## 2021-09-14 NOTE — Telephone Encounter (Signed)
Maybe see if [place] or [place] or [facility] virtual visit would be ok

## 2021-09-14 NOTE — Telephone Encounter (Signed)
Pt states for a year now after she takes a shower her right foot gets numb and last about 2 hours.  Then it gets fine.  She states the numbness went up her leg a little this time, so she thought she would call.  She states she felt so silly about this, that is why she let it go so long.  It has always resolved after 2 hours. Would like to discuss with a nurse.  Reason for Disposition  [1] Weakness of arm / hand, or leg / foot AND [2] is a chronic symptom (recurrent or ongoing AND present > 4 weeks)  Answer Assessment - Initial Assessment Questions 1. SYMPTOM: "What is the main symptom you are concerned about?" (e.g., weakness, numbness)     Numbness- with shower- R foot 2. ONSET: "When did this start?" (minutes, hours, days; while sleeping)     1 year 3. LAST NORMAL: "When was the last time you (the patient) were normal (no symptoms)?"     1 year 4. PATTERN "Does this come and go, or has it been constant since it started?"  "Is it present now?"     Comes and goes- only with shower- lasting 2 hours- then goes away 5. CARDIAC SYMPTOMS: "Have you had any of the following symptoms: chest pain, difficulty breathing, palpitations?"     A-fib- diagnosed February  6. NEUROLOGIC SYMPTOMS: "Have you had any of the following symptoms: headache, dizziness, vision loss, double vision, changes in speech, unsteady on your feet?"     No- only when in A-fib 7. OTHER SYMPTOMS: "Do you have any other symptoms?"     no 8. PREGNANCY: "Is there any chance you are pregnant?" "When was your last menstrual period?"     N/a  Protocols used: Neurologic Deficit-A-AH

## 2021-09-14 NOTE — Telephone Encounter (Signed)
Patient has an appointment with [facility] tomorrow, 09/15/21, for this.  For your information.

## 2021-09-14 NOTE — Telephone Encounter (Signed)
Outreach made to Pt's PCP.  Per Pt-she would like to get pre procedure labs at Dr. Nancy Nordmann office if possible.  Procedure date 11/03/2021.    Needs BMP and CBC on 10/17/2021.  Advised of Pt wanting lab work at that office.    Receptionist advised she would ask Dr. B and confirm.

## 2021-09-14 NOTE — Telephone Encounter (Signed)
Patient scheduled at [facility] for 09/15/21, however patient is wanting to be seen by an MD. I called [place] to check for an appt. Per [facility] they will check with providers first.

## 2021-09-14 NOTE — Telephone Encounter (Signed)
Patient is calling to report she has heat sensitivity in her R foot- she gets numbness and tingling after she showers that lasts up to 2 hours- then goes away. Patient reports this has been going on for a long time- but it she states she felt the numbness further up the leg today. Patient advised she does need evaluation for this- no appointment available in office within disposition. Call sent for review by PCP and appointment scheduling- patient call completed at lunch so message sent.

## 2021-09-14 NOTE — Telephone Encounter (Signed)
Copied from [place] [phone]. Topic: Appointment Scheduling - Scheduling Inquiry for Clinic >> Sep 14, 2021  3:28 PM Alexis Lyons wrote: Reason for CRM: pt would like to see one of yours doctors and not a NP. Pt has had foot numbness for over a year. But only after taking a shower. It resides after 2 hours.  [name] calling to ask if one of your drs can see her even next week.

## 2021-09-15 ENCOUNTER — Ambulatory Visit (INDEPENDENT_AMBULATORY_CARE_PROVIDER_SITE_OTHER): Payer: Medicare HMO | Admitting: Nurse Practitioner

## 2021-09-15 ENCOUNTER — Telehealth: Payer: Self-pay

## 2021-09-15 ENCOUNTER — Encounter: Payer: Self-pay | Admitting: Nurse Practitioner

## 2021-09-15 ENCOUNTER — Other Ambulatory Visit: Payer: Self-pay

## 2021-09-15 ENCOUNTER — Encounter (HOSPITAL_COMMUNITY): Payer: Self-pay

## 2021-09-15 VITALS — BP 110/72 | HR 60 | Temp 98.9°F | Wt 122.6 lb

## 2021-09-15 DIAGNOSIS — I480 Paroxysmal atrial fibrillation: Secondary | ICD-10-CM

## 2021-09-15 DIAGNOSIS — R202 Paresthesia of skin: Secondary | ICD-10-CM | POA: Insufficient documentation

## 2021-09-15 DIAGNOSIS — I10 Essential (primary) hypertension: Secondary | ICD-10-CM

## 2021-09-15 LAB — BAYER DCA HB A1C WAIVED: HB A1C (BAYER DCA - WAIVED): 5.3 % (ref 4.8–5.6)

## 2021-09-15 NOTE — Telephone Encounter (Signed)
Copied from [place] [id number]. Topic: General - Other >> Sep 14, 2021  4:03 PM Pawlus, Brayton Layman A wrote: Reason for CRM: Pt needed to do labwork before her upcoming procedure. Please advise if lab orders can be placed so pt can come to [facility] to get her labs completed. Please advise if anything needs to be sent over from [place] get her lab orders placed.

## 2021-09-15 NOTE — Telephone Encounter (Signed)
Patient advised to stop by the office for labs only next week.

## 2021-09-15 NOTE — Telephone Encounter (Signed)
Patient will keep appt at [facility] today.

## 2021-09-15 NOTE — Telephone Encounter (Signed)
Which labs did you want to order? Please advise. Thanks!

## 2021-09-15 NOTE — Progress Notes (Signed)
Acute Office Visit  Subjective:    Patient ID: Alexis Lyons, female    DOB: October 20, 1941, 80 y.o.   MRN: [id number]  Chief Complaint  Patient presents with   Numbness    Pt states that for the last year she has had numbness in her R foot after showering. States it stays numb and tingles for about 2 hours after showering    HPI Patient is in today for numbness and tingling in her right foot. This has been going on for over a year, but recently she noticed the numbness and tingling a little higher in her leg the past few days. Her symptoms typically get worse after taking a shower, but resolve after 2 hours. Denies any symptoms in her left foot or leg. Denies shortness of breath and chest pain.   Past Medical History:  Diagnosis Date   Amnesia    Aphthae    Atrial fibrillation ([place])    Congestion-fibrosis syndrome    DD (diverticular disease)    GERD (gastroesophageal reflux disease)    Seizures ([place]) 12/2017   passed out due to dehydration    Past Surgical History:  Procedure Laterality Date   CHOLECYSTECTOMY     colonoscopy with polypectomy     8/11 4 mm polyp, 12 mm polyp   COLONOSCOPY WITH PROPOFOL N/A 02/24/2018   Procedure: COLONOSCOPY WITH PROPOFOL;  Surgeon: Manya Silvas, MD;  Location: [facility] ENDOSCOPY;  Service: Endoscopy;  Laterality: N/A;   HERNIA REPAIR Right 2004   inguinal    HERNIA REPAIR     x's 3    Family History  Problem Relation Age of Onset   Alzheimer's disease Mother    Alcohol abuse Father    Cancer Father        liver cancer   Cancer Sister        breast   Breast cancer Sister     Social History   Socioeconomic History   Marital status: Divorced    Spouse name: Not on file   Number of children: 2   Years of education: 15   Highest education level: Not on file  Occupational History   Occupation: [employer] in [place]  Tobacco Use   Smoking status: Former    Packs/day: 0.50    Years: 15.00    Pack years: 7.50    Types:  Cigarettes    Quit date: 12/16/1985    Years since quitting: 35.7   Smokeless tobacco: Never  Vaping Use   Vaping Use: Never used  Substance and Sexual Activity   Alcohol use: Yes    Alcohol/week: 7.0 standard drinks    Types: 7 Glasses of wine per week    Comment: glass of wine with dinner   Drug use: No   Sexual activity: Not Currently  Other Topics Concern   Not on file  Social History Narrative   Not on file   Social Determinants of Health   Financial Resource Strain: Not on file  Food Insecurity: Not on file  Transportation Needs: Not on file  Physical Activity: Not on file  Stress: Not on file  Social Connections: Not on file  Intimate Partner Violence: Not on file    Outpatient Medications Prior to Visit  Medication Sig Dispense Refill   apixaban (ELIQUIS) 5 MG TABS tablet Take 1 tablet (5 mg total) by mouth 2 (two) times daily. 180 tablet 1   Ginseng 100 MG CAPS every other day.  hydrochlorothiazide (HYDRODIURIL) 25 MG tablet TAKE 1 TABLET BY MOUTH ONCE DAILY 90 tablet 1   zolpidem (AMBIEN) 5 MG tablet TAKE 1/2 TABLET BY MOUTH AT BEDTIME AS NEEDED SLEEP 15 tablet 5   No facility-administered medications prior to visit.    Allergies  Allergen Reactions   Codeine     Review of Systems  Constitutional:  Positive for fatigue.  Eyes:  Positive for visual disturbance (only when in a-fib).  Respiratory: Negative.    Cardiovascular: Negative.   Gastrointestinal: Negative.   Musculoskeletal: Negative.   Skin: Negative.   Neurological:  Positive for dizziness (when in a-fib) and numbness (in right foot after showering).      Objective:    Physical Exam Vitals and nursing note reviewed.  Constitutional:      General: She is not in acute distress.    Appearance: Normal appearance.  HENT:     Head: Normocephalic.  Eyes:     Conjunctiva/sclera: Conjunctivae normal.  Cardiovascular:     Rate and Rhythm: Normal rate and regular rhythm.     Pulses:  Normal pulses.     Heart sounds: Normal heart sounds.  Pulmonary:     Effort: Pulmonary effort is normal.     Breath sounds: Normal breath sounds.  Musculoskeletal:        General: No swelling or tenderness. Normal range of motion.     Cervical back: Normal range of motion.  Skin:    General: Skin is warm.  Neurological:     General: No focal deficit present.     Mental Status: She is alert and oriented to person, place, and time.  Psychiatric:        Mood and Affect: Mood normal.        Behavior: Behavior normal.        Thought Content: Thought content normal.        Judgment: Judgment normal.   Diabetic Foot Exam - Simple   Simple Foot Form Visual Inspection No deformities, no ulcerations, no other skin breakdown bilaterally: Yes Sensation Testing Intact to touch and monofilament testing bilaterally: Yes Pulse Check Posterior Tibialis and Dorsalis pulse intact bilaterally: Yes Comments     BP 110/72 (BP Location: Left Arm, Patient Position: Sitting)   Pulse 60   Temp 98.9 F (37.2 C) (Oral)   Wt 122 lb 9.6 oz (55.6 kg)   SpO2 98%   BMI 21.38 kg/m  Wt Readings from Last 3 Encounters:  09/15/21 122 lb 9.6 oz (55.6 kg)  09/13/21 121 lb (54.9 kg)  08/28/21 117 lb (53.1 kg)    Health Maintenance Due  Topic Date Due   DEXA SCAN  07/02/2013   COVID-19 Vaccine (4 - Booster for Pfizer series) 04/27/2021   INFLUENZA VACCINE  Never done    There are no preventive care reminders to display for this patient.   Lab Results  Component Value Date   TSH 2.42 03/16/2015   Lab Results  Component Value Date   WBC 6.8 08/18/2021   HGB 16.4 (H) 08/18/2021   HCT 47.9 (H) 08/18/2021   MCV 87.6 08/18/2021   PLT 280 08/18/2021   Lab Results  Component Value Date   NA 136 08/18/2021   K 3.4 (L) 08/18/2021   CO2 25 08/18/2021   GLUCOSE 174 (H) 08/18/2021   BUN 16 08/18/2021   CREATININE 0.78 08/18/2021   BILITOT 1.4 (H) 03/14/2021   ALKPHOS 58 03/14/2021   AST 24  03/14/2021   ALT 18  03/14/2021   PROT 7.5 03/14/2021   ALBUMIN 4.6 03/14/2021   CALCIUM 9.3 08/18/2021   ANIONGAP 10 08/18/2021   Lab Results  Component Value Date   CHOL 196 11/07/2020   Lab Results  Component Value Date   HDL 78 11/07/2020   Lab Results  Component Value Date   LDLCALC 108 (H) 11/07/2020   Lab Results  Component Value Date   TRIG 53 11/07/2020   Lab Results  Component Value Date   CHOLHDL 2.5 11/07/2020   Lab Results  Component Value Date   HGBA1C 5.3 09/15/2021       Assessment & Plan:   Problem List Items Addressed This Visit       Other   Tingling sensation - Primary    Noted in right foot after showering. Lasts for about 2 hours after showering. Glucose has been elevated on past labs, will check A1C today. Her vitamin B12 was 143 on 07/07/19. Will recheck vitamin B12 and discussed foods that are high in B12. Will also check TSH and BMP. Based on lab results, can consider referral to neurology for further testing. Schedule a follow-up appointment with her PCP in 4 weeks.       Relevant Orders   Bayer DCA Hb A1c Waived (Completed)   Vitamin O17   Basic Metabolic Panel (BMET)   TSH     No orders of the defined types were placed in this encounter.    Charyl Dancer, NP

## 2021-09-15 NOTE — Assessment & Plan Note (Signed)
Noted in right foot after showering. Lasts for about 2 hours after showering. Glucose has been elevated on past labs, will check A1C today. Her vitamin B12 was 143 on 07/07/19. Will recheck vitamin B12 and discussed foods that are high in B12. Will also check TSH and BMP. Based on lab results, can consider referral to neurology for further testing. Schedule a follow-up appointment with her PCP in 4 weeks.

## 2021-09-15 NOTE — Telephone Encounter (Signed)
See phone note from cardiology yesterday.  Ok with getting CBC and BMP as they requested.

## 2021-09-16 LAB — VITAMIN B12: Vitamin B-12: 312 pg/mL (ref 232–1245)

## 2021-09-16 LAB — BASIC METABOLIC PANEL
BUN/Creatinine Ratio: 24 (ref 12–28)
BUN: 20 mg/dL (ref 8–27)
CO2: 25 mmol/L (ref 20–29)
Calcium: 9.7 mg/dL (ref 8.7–10.3)
Chloride: 99 mmol/L (ref 96–106)
Creatinine, Ser: 0.84 mg/dL (ref 0.57–1.00)
Glucose: 86 mg/dL (ref 70–99)
Potassium: 3.8 mmol/L (ref 3.5–5.2)
Sodium: 141 mmol/L (ref 134–144)
eGFR: 71 mL/min/{1.73_m2} (ref >59)

## 2021-09-16 LAB — TSH: TSH: 2.09 u[IU]/mL (ref 0.450–4.500)

## 2021-09-18 ENCOUNTER — Encounter: Payer: Medicare HMO | Admitting: Family Medicine

## 2021-09-18 ENCOUNTER — Ambulatory Visit: Payer: Medicare HMO | Admitting: Cardiology

## 2021-10-05 ENCOUNTER — Telehealth: Payer: Self-pay

## 2021-10-05 NOTE — Telephone Encounter (Signed)
Pt has called back to confirm that new appointment time works. Thanks

## 2021-10-05 NOTE — Telephone Encounter (Signed)
Left detailed message for Pt advising afib ablation moved from November 18 to November 17.  Same time.  Everything else stays the same.  Requested call back to confirm she had received my message.

## 2021-10-09 ENCOUNTER — Encounter: Payer: Self-pay | Admitting: Family Medicine

## 2021-10-09 ENCOUNTER — Ambulatory Visit (INDEPENDENT_AMBULATORY_CARE_PROVIDER_SITE_OTHER): Payer: Medicare HMO | Admitting: Family Medicine

## 2021-10-09 ENCOUNTER — Other Ambulatory Visit: Payer: Self-pay

## 2021-10-09 VITALS — BP 125/86 | HR 64 | Temp 95.7°F | Ht 63.0 in | Wt 121.3 lb

## 2021-10-09 DIAGNOSIS — G629 Polyneuropathy, unspecified: Secondary | ICD-10-CM

## 2021-10-09 DIAGNOSIS — Z1231 Encounter for screening mammogram for malignant neoplasm of breast: Secondary | ICD-10-CM

## 2021-10-09 NOTE — Progress Notes (Signed)
Established patient visit   Patient: Alexis Lyons   DOB: Apr 12, 1941   80 y.o. Female  MRN: [id number] Visit Date: 10/09/2021  Today's healthcare provider: Lavon Paganini, MD   Chief Complaint  Patient presents with   Numbness   Subjective    HPI  -Patient declined flu vaccine -Would like to know if she has for sure aged out to get mammogram and colonoscopy. -Numbness in right foot intermittently for a year. Patient reports it going numb when she is taking a hot shower or bath. Typically last at least 2 hours and radiates to her ankles and sometimes up her leg. Patients reports never feeling it in her left foot.   Denies heavy alcohol use, diabetes, previously injury/surgery to R foot, no back surgery. Only intermittent back pain on L side, never on R side.  Memory issues intermittently with word finding difficulty. Matching with her peers.      Medications: Outpatient Medications Prior to Visit  Medication Sig   apixaban (ELIQUIS) 5 MG TABS tablet Take 1 tablet (5 mg total) by mouth 2 (two) times daily.   Ginseng 100 MG CAPS every other day.    hydrochlorothiazide (HYDRODIURIL) 25 MG tablet TAKE 1 TABLET BY MOUTH ONCE DAILY   zolpidem (AMBIEN) 5 MG tablet TAKE 1/2 TABLET BY MOUTH AT BEDTIME AS NEEDED SLEEP   No facility-administered medications prior to visit.    Review of Systems  Constitutional: Negative.   Respiratory: Negative.    Cardiovascular:  Positive for palpitations. Negative for chest pain and leg swelling.  Musculoskeletal: Negative.   Psychiatric/Behavioral: Negative.     Last CBC Lab Results  Component Value Date   WBC 6.8 08/18/2021   HGB 16.4 (H) 08/18/2021   HCT 47.9 (H) 08/18/2021   MCV 87.6 08/18/2021   MCH 30.0 08/18/2021   RDW 12.3 08/18/2021   PLT 280 91/63/8466   Last metabolic panel Lab Results  Component Value Date   GLUCOSE 86 09/15/2021   NA 141 09/15/2021   K 3.8 09/15/2021   CL 99 09/15/2021   CO2 25 09/15/2021    BUN 20 09/15/2021   CREATININE 0.84 09/15/2021   EGFR 71 09/15/2021   CALCIUM 9.7 09/15/2021   PHOS 3.2 12/23/2017   PROT 7.5 03/14/2021   ALBUMIN 4.6 03/14/2021   LABGLOB 2.4 11/07/2020   AGRATIO 1.9 11/07/2020   BILITOT 1.4 (H) 03/14/2021   ALKPHOS 58 03/14/2021   AST 24 03/14/2021   ALT 18 03/14/2021   ANIONGAP 10 08/18/2021   Last lipids Lab Results  Component Value Date   CHOL 196 11/07/2020   HDL 78 11/07/2020   LDLCALC 108 (H) 11/07/2020   TRIG 53 11/07/2020   CHOLHDL 2.5 11/07/2020   Last hemoglobin A1c Lab Results  Component Value Date   HGBA1C 5.3 09/15/2021   Last thyroid functions Lab Results  Component Value Date   TSH 2.090 09/15/2021   Last vitamin D No results found for: 25OHVITD2, 25OHVITD3, VD25OH Last vitamin B12 and Folate Lab Results  Component Value Date   VITAMINB12 312 09/15/2021       Objective    BP 125/86 (BP Location: Left Arm, Patient Position: Sitting, Cuff Size: Normal)   Pulse 64   Temp (!) 95.7 F (35.4 C) (Temporal)   Ht '5\' 3"'  (1.6 m)   Wt 121 lb 4.8 oz (55 kg)   SpO2 98%   BMI 21.49 kg/m  BP Readings from Last 3 Encounters:  10/09/21 125/86  09/15/21 110/72  09/13/21 (!) 146/96   Wt Readings from Last 3 Encounters:  10/09/21 121 lb 4.8 oz (55 kg)  09/15/21 122 lb 9.6 oz (55.6 kg)  09/13/21 121 lb (54.9 kg)      Physical Exam Vitals reviewed.  Constitutional:      General: She is not in acute distress.    Appearance: Normal appearance. She is well-developed. She is not diaphoretic.  HENT:     Head: Normocephalic and atraumatic.  Eyes:     General: No scleral icterus.    Conjunctiva/sclera: Conjunctivae normal.  Neck:     Thyroid: No thyromegaly.  Cardiovascular:     Rate and Rhythm: Normal rate and regular rhythm.     Pulses: Normal pulses.     Heart sounds: Normal heart sounds. No murmur heard. Pulmonary:     Effort: Pulmonary effort is normal. No respiratory distress.     Breath sounds: Normal  breath sounds. No wheezing, rhonchi or rales.  Musculoskeletal:     Cervical back: Neck supple.     Right lower leg: No edema.     Left lower leg: No edema.  Lymphadenopathy:     Cervical: No cervical adenopathy.  Skin:    General: Skin is warm and dry.     Findings: No rash.  Neurological:     Mental Status: She is alert and oriented to person, place, and time. Mental status is at baseline.     Motor: No weakness.     Gait: Gait normal.  Psychiatric:        Mood and Affect: Mood normal.        Behavior: Behavior normal.      No results found for any visits on 10/09/21.  Assessment & Plan     Problem List Items Addressed This Visit       Nervous and Auditory   Neuropathy - Primary    New problem Unilateral No back symptoms on that side No history of diabetes or alcoholism Check B12 with next labs Referral to neurology for possible nerve conduction study to determine source of neuropathy      Relevant Orders   Ambulatory referral to Neurology   Other Visit Diagnoses     Screening mammogram for breast cancer       Relevant Orders   MM 3D SCREEN BREAST BILATERAL        No follow-ups on file.      Total time spent on today's visit was greater than 30 minutes, including both face-to-face time and nonface-to-face time personally spent on review of chart (labs and imaging), discussing labs and goals, discussing further work-up, treatment options, referrals to specialist if needed, reviewing outside records of pertinent, answering patient's questions, and coordinating care.    I, Lavon Paganini, MD, have reviewed all documentation for this visit. The documentation on 10/10/21 for the exam, diagnosis, procedures, and orders are all accurate and complete.   Kijuan Gallicchio, Dionne Bucy, MD, MPH [facility]

## 2021-10-10 NOTE — Assessment & Plan Note (Signed)
New problem Unilateral No back symptoms on that side No history of diabetes or alcoholism Check B12 with next labs Referral to neurology for possible nerve conduction study to determine source of neuropathy

## 2021-10-17 ENCOUNTER — Telehealth: Payer: Self-pay | Admitting: Cardiology

## 2021-10-17 DIAGNOSIS — I480 Paroxysmal atrial fibrillation: Secondary | ICD-10-CM

## 2021-10-17 NOTE — Telephone Encounter (Signed)
Returned call to Pt.  Per Pt Dr. Nancy Nordmann office said they did not have orders.    Scheduled Pt to have labs at [facility] 10/18/2021  Pt thanked nurse for call back.

## 2021-10-17 NOTE — Telephone Encounter (Signed)
Pt requesting for order to be put in for lab work... please advise

## 2021-10-18 ENCOUNTER — Other Ambulatory Visit: Payer: Self-pay

## 2021-10-18 ENCOUNTER — Other Ambulatory Visit (INDEPENDENT_AMBULATORY_CARE_PROVIDER_SITE_OTHER): Payer: Medicare HMO

## 2021-10-18 DIAGNOSIS — I480 Paroxysmal atrial fibrillation: Secondary | ICD-10-CM

## 2021-10-19 LAB — CBC WITH DIFFERENTIAL/PLATELET
Basophils Absolute: 0.1 10*3/uL (ref 0.0–0.2)
Basos: 1 %
EOS (ABSOLUTE): 0.1 10*3/uL (ref 0.0–0.4)
Eos: 3 %
Hematocrit: 44.2 % (ref 34.0–46.6)
Hemoglobin: 15.1 g/dL (ref 11.1–15.9)
Immature Grans (Abs): 0 10*3/uL (ref 0.0–0.1)
Immature Granulocytes: 0 %
Lymphocytes Absolute: 1.3 10*3/uL (ref 0.7–3.1)
Lymphs: 26 %
MCH: 30.5 pg (ref 26.6–33.0)
MCHC: 34.2 g/dL (ref 31.5–35.7)
MCV: 89 fL (ref 79–97)
Monocytes Absolute: 0.6 10*3/uL (ref 0.1–0.9)
Monocytes: 11 %
Neutrophils Absolute: 3 10*3/uL (ref 1.4–7.0)
Neutrophils: 59 %
Platelets: 264 10*3/uL (ref 150–450)
RBC: 4.95 x10E6/uL (ref 3.77–5.28)
RDW: 12.4 % (ref 11.7–15.4)
WBC: 5 10*3/uL (ref 3.4–10.8)

## 2021-10-19 LAB — BASIC METABOLIC PANEL
BUN/Creatinine Ratio: 13 (ref 12–28)
BUN: 11 mg/dL (ref 8–27)
CO2: 26 mmol/L (ref 20–29)
Calcium: 9.7 mg/dL (ref 8.7–10.3)
Chloride: 97 mmol/L (ref 96–106)
Creatinine, Ser: 0.82 mg/dL (ref 0.57–1.00)
Glucose: 92 mg/dL (ref 70–99)
Potassium: 3.8 mmol/L (ref 3.5–5.2)
Sodium: 138 mmol/L (ref 134–144)
eGFR: 73 mL/min/{1.73_m2} (ref >59)

## 2021-10-26 ENCOUNTER — Ambulatory Visit: Admission: RE | Admit: 2021-10-26 | Payer: Medicare HMO | Source: Ambulatory Visit

## 2021-10-27 ENCOUNTER — Telehealth (HOSPITAL_COMMUNITY): Payer: Self-pay | Admitting: *Deleted

## 2021-10-27 NOTE — Telephone Encounter (Signed)
Reaching out to patient to offer assistance regarding upcoming cardiac imaging study; pt verbalizes understanding of appt date/time, parking situation and where to check in, pre-test NPO status and medications ordered, and verified current allergies; name and call back number provided for further questions should they arise  Johnna Bollier RN Navigator Cardiac [facility] [phone] office [phone] cell  

## 2021-10-30 ENCOUNTER — Other Ambulatory Visit: Payer: Self-pay

## 2021-10-30 ENCOUNTER — Ambulatory Visit
Admission: RE | Admit: 2021-10-30 | Discharge: 2021-10-30 | Disposition: A | Discharge status: Home / Self Care | Payer: Medicare HMO | Source: Ambulatory Visit | Attending: Cardiology | Admitting: Cardiology

## 2021-10-30 DIAGNOSIS — I4891 Unspecified atrial fibrillation: Secondary | ICD-10-CM | POA: Diagnosis not present

## 2021-10-30 MED ORDER — IOHEXOL 350 MG/ML SOLN
75.0000 mL | Freq: Once | INTRAVENOUS | Status: AC | PRN
  Administered 2021-10-30: 75 mL via INTRAVENOUS

## 2021-11-01 NOTE — Pre-Procedure Instructions (Signed)
Instructed patient on the following items: Arrival time 0830 Nothing to eat or drink after midnight No meds AM of procedure Responsible person to drive you home and stay with you for 24 hrs  Have you missed any doses of anti-coagulant Eliquis- hasn't missed any doses   

## 2021-11-02 ENCOUNTER — Ambulatory Visit (HOSPITAL_COMMUNITY)
Admission: RE | Admit: 2021-11-02 | Discharge: 2021-11-02 | Disposition: A | Discharge status: Home / Self Care | Payer: Medicare HMO | Attending: Cardiology | Admitting: Cardiology

## 2021-11-02 ENCOUNTER — Other Ambulatory Visit: Payer: Self-pay

## 2021-11-02 ENCOUNTER — Encounter (HOSPITAL_COMMUNITY): Payer: Self-pay | Admitting: Cardiology

## 2021-11-02 ENCOUNTER — Ambulatory Visit (HOSPITAL_COMMUNITY): Payer: Medicare HMO | Admitting: Certified Registered"

## 2021-11-02 ENCOUNTER — Encounter (HOSPITAL_COMMUNITY)
Admission: RE | Disposition: A | Discharge status: Home / Self Care | Payer: Self-pay | Source: Home / Self Care | Attending: Cardiology

## 2021-11-02 DIAGNOSIS — Z7901 Long term (current) use of anticoagulants: Secondary | ICD-10-CM | POA: Diagnosis not present

## 2021-11-02 DIAGNOSIS — I10 Essential (primary) hypertension: Secondary | ICD-10-CM | POA: Insufficient documentation

## 2021-11-02 DIAGNOSIS — I480 Paroxysmal atrial fibrillation: Secondary | ICD-10-CM | POA: Diagnosis not present

## 2021-11-02 DIAGNOSIS — K219 Gastro-esophageal reflux disease without esophagitis: Secondary | ICD-10-CM | POA: Diagnosis not present

## 2021-11-02 DIAGNOSIS — G4700 Insomnia, unspecified: Secondary | ICD-10-CM | POA: Diagnosis not present

## 2021-11-02 HISTORY — PX: ATRIAL FIBRILLATION ABLATION: EP1191

## 2021-11-02 LAB — POCT ACTIVATED CLOTTING TIME
Activated Clotting Time: 219 seconds
Activated Clotting Time: 294 seconds
Activated Clotting Time: 387 seconds

## 2021-11-02 SURGERY — ATRIAL FIBRILLATION ABLATION
Anesthesia: General

## 2021-11-02 MED ORDER — HEPARIN (PORCINE) IN NACL 1000-0.9 UT/500ML-% IV SOLN
INTRAVENOUS | Status: DC | PRN
  Administered 2021-11-02 (×3): 500 mL

## 2021-11-02 MED ORDER — HEPARIN (PORCINE) IN NACL 1000-0.9 UT/500ML-% IV SOLN
INTRAVENOUS | Status: AC
  Filled 2021-11-02: qty 500

## 2021-11-02 MED ORDER — ONDANSETRON HCL 4 MG/2ML IJ SOLN: 4.0000 mg | Freq: Four times a day (QID) | INTRAMUSCULAR | Status: DC | PRN

## 2021-11-02 MED ORDER — ACETAMINOPHEN 325 MG PO TABS
650.0000 mg | ORAL_TABLET | ORAL | Status: DC | PRN
  Filled 2021-11-02: qty 2

## 2021-11-02 MED ORDER — PROPOFOL 10 MG/ML IV BOLUS
INTRAVENOUS | Status: DC | PRN
  Administered 2021-11-02: 120 mg via INTRAVENOUS

## 2021-11-02 MED ORDER — ISOPROTERENOL HCL 0.2 MG/ML IJ SOLN
INTRAVENOUS | Status: DC | PRN
  Administered 2021-11-02: 13:00:00 4 ug/min via INTRAVENOUS

## 2021-11-02 MED ORDER — ONDANSETRON HCL 4 MG/2ML IJ SOLN
INTRAMUSCULAR | Status: DC | PRN
  Administered 2021-11-02: 4 mg via INTRAVENOUS

## 2021-11-02 MED ORDER — APIXABAN 5 MG PO TABS
5.0000 mg | ORAL_TABLET | Freq: Two times a day (BID) | ORAL | Status: DC
  Administered 2021-11-02: 16:00:00 5 mg via ORAL
  Filled 2021-11-02 (×2): qty 1

## 2021-11-02 MED ORDER — HEPARIN SODIUM (PORCINE) 1000 UNIT/ML IJ SOLN
INTRAMUSCULAR | Status: DC | PRN
  Administered 2021-11-02: 1000 [IU] via INTRAVENOUS

## 2021-11-02 MED ORDER — SODIUM CHLORIDE 0.9% FLUSH: 3.0000 mL | Freq: Two times a day (BID) | INTRAVENOUS | Status: DC

## 2021-11-02 MED ORDER — ISOPROTERENOL HCL 0.2 MG/ML IJ SOLN
INTRAMUSCULAR | Status: AC
  Filled 2021-11-02: qty 5

## 2021-11-02 MED ORDER — SODIUM CHLORIDE 0.9% FLUSH: 3.0000 mL | INTRAVENOUS | Status: DC | PRN

## 2021-11-02 MED ORDER — PROTAMINE SULFATE 10 MG/ML IV SOLN
INTRAVENOUS | Status: DC | PRN
  Administered 2021-11-02: 30 mg via INTRAVENOUS

## 2021-11-02 MED ORDER — SODIUM CHLORIDE 0.9 % IV SOLN: 250.0000 mL | INTRAVENOUS | Status: DC | PRN

## 2021-11-02 MED ORDER — HEPARIN SODIUM (PORCINE) 1000 UNIT/ML IJ SOLN
INTRAMUSCULAR | Status: AC
  Filled 2021-11-02: qty 1

## 2021-11-02 MED ORDER — PHENYLEPHRINE 40 MCG/ML (10ML) SYRINGE FOR IV PUSH (FOR BLOOD PRESSURE SUPPORT)
PREFILLED_SYRINGE | INTRAVENOUS | Status: DC | PRN
  Administered 2021-11-02 (×3): 80 ug via INTRAVENOUS

## 2021-11-02 MED ORDER — ROCURONIUM BROMIDE 10 MG/ML (PF) SYRINGE
PREFILLED_SYRINGE | INTRAVENOUS | Status: DC | PRN
  Administered 2021-11-02: 60 mg via INTRAVENOUS
  Administered 2021-11-02: 10 mg via INTRAVENOUS

## 2021-11-02 MED ORDER — MIDAZOLAM HCL 2 MG/2ML IJ SOLN
INTRAMUSCULAR | Status: DC | PRN
  Administered 2021-11-02: 2 mg via INTRAVENOUS

## 2021-11-02 MED ORDER — SODIUM CHLORIDE 0.9 % IV SOLN: INTRAVENOUS | Status: DC

## 2021-11-02 MED ORDER — LIDOCAINE 2% (20 MG/ML) 5 ML SYRINGE
INTRAMUSCULAR | Status: DC | PRN
  Administered 2021-11-02: 60 mg via INTRAVENOUS

## 2021-11-02 MED ORDER — PHENYLEPHRINE HCL-NACL 20-0.9 MG/250ML-% IV SOLN
INTRAVENOUS | Status: DC | PRN
  Administered 2021-11-02: 20 ug/min via INTRAVENOUS

## 2021-11-02 MED ORDER — FENTANYL CITRATE (PF) 100 MCG/2ML IJ SOLN
INTRAMUSCULAR | Status: DC | PRN
  Administered 2021-11-02: 100 ug via INTRAVENOUS

## 2021-11-02 MED ORDER — DEXAMETHASONE SODIUM PHOSPHATE 10 MG/ML IJ SOLN
INTRAMUSCULAR | Status: DC | PRN
  Administered 2021-11-02: 5 mg via INTRAVENOUS

## 2021-11-02 MED ORDER — HEPARIN SODIUM (PORCINE) 1000 UNIT/ML IJ SOLN
INTRAMUSCULAR | Status: DC | PRN
  Administered 2021-11-02: 5000 [IU] via INTRAVENOUS
  Administered 2021-11-02 (×2): 7000 [IU] via INTRAVENOUS

## 2021-11-02 SURGICAL SUPPLY — 17 items
CATH OCTARAY 1.5 F (CATHETERS) ×1 IMPLANT
CATH S CIRCA THERM PROBE 10F (CATHETERS) ×1 IMPLANT
CATH SMTCH THERMOCOOL SF DF (CATHETERS) ×1 IMPLANT
CATH SOUNDSTAR ECO 8FR (CATHETERS) ×1 IMPLANT
CATH WEB BI DIR CSDF CRV REPRO (CATHETERS) ×1 IMPLANT
CLOSURE PERCLOSE PROSTYLE (VASCULAR PRODUCTS) ×3 IMPLANT
COVER SWIFTLINK CONNECTOR (BAG) ×2 IMPLANT
PACK EP LATEX FREE (CUSTOM PROCEDURE TRAY) ×2
PACK EP LF (CUSTOM PROCEDURE TRAY) ×1 IMPLANT
PAD PRO RADIOLUCENT 2001M-C (PAD) ×2 IMPLANT
PATCH CARTO3 (PAD) ×1 IMPLANT
SHEATH BAYLIS TRANSSEPTAL 98CM (NEEDLE) ×1 IMPLANT
SHEATH CARTO VIZIGO SM CVD (SHEATH) ×1 IMPLANT
SHEATH PINNACLE 8F 10CM (SHEATH) ×2 IMPLANT
SHEATH PINNACLE 9F 10CM (SHEATH) ×1 IMPLANT
SHEATH PROBE COVER 6X72 (BAG) ×1 IMPLANT
TUBING SMART ABLATE COOLFLOW (TUBING) ×1 IMPLANT

## 2021-11-02 NOTE — H&P (Signed)
Electrophysiology Office Note:     Date:  09/13/2021    ID:  Alexis Lyons, DOB [birth year], MRN [id number]   PCP:  Virginia Crews, MD  [facility] Cardiologist:  Kate Sable, MD  [facility] Electrophysiologist:  Vickie Epley, MD    Referring MD: Kate Sable, MD    Chief Complaint: AF   History of Present Illness:     Alexis Lyons is a 80 y.o. female who presents for an evaluation of AF at the request of Dr Alexis Lyons. Their medical history includes GERD, diverticulosis and AF.    She last saw Dr Brita Romp on 08/28/2021 in follow up. She presented to the ER with AF on 08/18/2021. She has previously used diltiazem and metoprolol to help manage her AF but the metoprolol is causing dizziness and lightheadedness. She has now transitioned to taking metoprolol only as needed. She is having 3-4 episodes of AF per month. Episodes last hours at a time and are highly symptomatic. She takes eliquis for stroke prophylaxis without any problems with bleeding. She is interested in a rhythm control strategy.        Past Medical History:  Diagnosis Date   Amnesia     Aphthae     Atrial fibrillation ([place])     Congestion-fibrosis syndrome     DD (diverticular disease)     GERD (gastroesophageal reflux disease)     Seizures ([place]) 12/2017    passed out due to dehydration           Past Surgical History:  Procedure Laterality Date   CHOLECYSTECTOMY       colonoscopy with polypectomy        8/11 4 mm polyp, 12 mm polyp   COLONOSCOPY WITH PROPOFOL N/A 02/24/2018    Procedure: COLONOSCOPY WITH PROPOFOL;  Surgeon: Manya Silvas, MD;  Location: [facility] ENDOSCOPY;  Service: Endoscopy;  Laterality: N/A;   HERNIA REPAIR Right 2004    inguinal    HERNIA REPAIR        x's 3      Current Medications: Active Medications      Current Meds  Medication Sig   apixaban (ELIQUIS) 5 MG TABS tablet Take 1 tablet (5 mg total) by mouth 2 (two) times daily.   Ginseng 100 MG  CAPS every other day.    zolpidem (AMBIEN) 5 MG tablet TAKE 1/2 TABLET BY MOUTH AT BEDTIME AS NEEDED SLEEP   [DISCONTINUED] diltiazem (CARDIZEM CD) 120 MG 24 hr capsule Take 1 capsule (120 mg total) by mouth daily.        Allergies:   Codeine    Social History         Socioeconomic History   Marital status: Divorced      Spouse name: Not on file   Number of children: 2   Years of education: 15   Highest education level: Not on file  Occupational History   Occupation: [employer] in [place] Use   Smoking status: Former      Packs/day: 0.50      Years: 15.00      Pack years: 7.50      Types: Cigarettes      Quit date: 12/16/1985      Years since quitting: 35.7   Smokeless tobacco: Never  Vaping Use   Vaping Use: Never used  Substance and Sexual Activity   Alcohol use: Yes      Alcohol/week: 7.0 standard drinks  Types: 7 Glasses of wine per week      Comment: glass of wine with dinner   Drug use: No   Sexual activity: Not Currently  Other Topics Concern   Not on file  Social History Narrative   Not on file    Social Determinants of Health    Financial Resource Strain: Not on file  Food Insecurity: Not on file  Transportation Needs: Not on file  Physical Activity: Not on file  Stress: Not on file  Social Connections: Not on file      Family History: The patient's family history includes Alcohol abuse in her father; Alzheimer's disease in her mother; Breast cancer in her sister; Cancer in her father and sister.   ROS:   Please see the history of present illness.    All other systems reviewed and are negative.   EKGs/Labs/Other Studies Reviewed:     The following studies were reviewed today:     05/04/2021 Echo personally reviewed EF normal, 60% RV normal Mild-moderate TR             EKG:  The ekg ordered today demonstrates sinus bradycardia   Recent Labs: 03/14/2021: ALT 18 08/18/2021: BUN 16; Creatinine, Ser 0.78; Hemoglobin 16.4; Platelets  280; Potassium 3.4; Sodium 136  Recent Lipid Panel Labs (Brief)          Component Value Date/Time    CHOL 196 11/07/2020 0849    TRIG 53 11/07/2020 0849    HDL 78 11/07/2020 0849    CHOLHDL 2.5 11/07/2020 0849    LDLCALC 108 (H) 11/07/2020 0849        Physical Exam:     VS:  BP (!) 146/96 (BP Location: Left Arm, Patient Position: Sitting, Cuff Size: Normal)   Pulse (!) 59   Ht 5' 3.5" (1.613 m)   Wt 121 lb (54.9 kg)   SpO2 96%   BMI 21.10 kg/m         Wt Readings from Last 3 Encounters:  09/13/21 121 lb (54.9 kg)  08/28/21 117 lb (53.1 kg)  08/18/21 116 lb (52.6 kg)      GEN:  Well nourished, well developed in no acute distress. Appears younger than stated age. HEENT: Normal NECK: No JVD; No carotid bruits LYMPHATICS: No lymphadenopathy CARDIAC: RRR, no murmurs, rubs, gallops RESPIRATORY:  Clear to auscultation without rales, wheezing or rhonchi  ABDOMEN: Soft, non-tender, non-distended MUSCULOSKELETAL:  No edema; No deformity  SKIN: Warm and dry NEUROLOGIC:  Alert and oriented x 3 PSYCHIATRIC:  Normal affect    ASSESSMENT:     1. Paroxysmal atrial fibrillation (HCC)   2. Primary hypertension   3. Atrial fibrillation, unspecified type ([place])     PLAN:     In order of problems listed above:     1. Paroxysmal atrial fibrillation (HCC) Highly symptomatic paroxysms of AF. I discussed the options for managing her AF including continued conservative management, rhythm control using antiarrhythmic therapy and ablation therapy. I discussed the efficacy of each option. She would like to proceed with scheduling an ablation. She will need a CT prior to the procedure.   Risk, benefits, and alternatives to EP study and radiofrequency ablation for afib were also discussed in detail today. These risks include but are not limited to stroke, bleeding, vascular damage, tamponade, perforation, damage to the esophagus, lungs, and other structures, pulmonary vein stenosis,  worsening renal function, and death. The patient understands these risk and wishes to proceed.  We will therefore  proceed with catheter ablation at the next available time.  Carto, ICE, anesthesia are requested for the procedure.  Will also obtain CT PV protocol prior to the procedure to exclude LAA thrombus and further evaluate atrial anatomy.     2. Primary hypertension Controlled. She is having dizziness and intermittent low blood pressures on diltiazem. She can stop this medication now and keep a close eye on her Bps at home. I have encouraged to check her BP daily. If the Bps increase, would consider adding alternative anti-hypertensive such as amlodipine.   3. Atrial fibrillation, unspecified type ([place]) See above.     --------------------------  I have seen, examined the patient, and reviewed the above assessment and plan.    Plan for PVI today.   Vickie Epley, MD 11/02/2021 10:29 AM

## 2021-11-02 NOTE — Anesthesia Preprocedure Evaluation (Signed)
Anesthesia Evaluation  Patient identified by MRN, date of birth, ID band Patient awake    Reviewed: Allergy & Precautions, NPO status , Patient's Chart, lab work & pertinent test results  History of Anesthesia Complications Negative for: history of anesthetic complications  Airway Mallampati: II  TM Distance: >3 FB Neck ROM: Full    Dental  (+) Dental Advisory Given, Teeth Intact   Pulmonary neg shortness of breath, neg sleep apnea, neg COPD, neg recent URI, former smoker,    breath sounds clear to auscultation       Cardiovascular hypertension, Pt. on medications (-) angina(-) CHF + dysrhythmias Atrial Fibrillation  Rhythm:Regular     Neuro/Psych  Headaches, negative psych ROS   GI/Hepatic Neg liver ROS, GERD  Controlled,  Endo/Other  negative endocrine ROS  Renal/GU negative Renal ROS     Musculoskeletal negative musculoskeletal ROS (+)   Abdominal   Peds  Hematology negative hematology ROS (+) eliquis  Lab Results      Component                Value               Date                      WBC                      5.0                 10/18/2021                HGB                      15.1                10/18/2021                HCT                      44.2                10/18/2021                MCV                      89                  10/18/2021                PLT                      264                 10/18/2021             Anesthesia Other Findings   Reproductive/Obstetrics                             Anesthesia Physical Anesthesia Plan  ASA: 2  Anesthesia Plan: General   Post-op Pain Management:    Induction: Intravenous  PONV Risk Score and Plan: 3 and Ondansetron and Dexamethasone  Airway Management Planned: Oral ETT  Additional Equipment: None  Intra-op Plan:   Post-operative Plan: Extubation in OR  Informed Consent: I have reviewed the patients History  and Physical, chart, labs and discussed the procedure including  the risks, benefits and alternatives for the proposed anesthesia with the patient or authorized representative who has indicated his/her understanding and acceptance.     Dental advisory given  Plan Discussed with: CRNA and Anesthesiologist  Anesthesia Plan Comments:         Anesthesia Quick Evaluation

## 2021-11-02 NOTE — Anesthesia Postprocedure Evaluation (Signed)
Anesthesia Post Note  Patient: Alexis Lyons  Procedure(s) Performed: ATRIAL FIBRILLATION ABLATION     Patient location during evaluation: Cath Lab Anesthesia Type: General Level of consciousness: awake and alert Pain management: pain level controlled Vital Signs Assessment: post-procedure vital signs reviewed and stable Respiratory status: spontaneous breathing, nonlabored ventilation, respiratory function stable and patient connected to nasal cannula oxygen Cardiovascular status: blood pressure returned to baseline and stable Postop Assessment: no apparent nausea or vomiting Anesthetic complications: no   No notable events documented.  Last Vitals:  Vitals:   11/02/21 1430 11/02/21 1442  BP:  128/72  Pulse: 61 60  Resp: (!) 25 14  Temp:    SpO2: 99% 95%    Last Pain:  Vitals:   11/02/21 1421  TempSrc:   PainSc: 0-No pain                 Shabree Tebbetts

## 2021-11-02 NOTE — Transfer of Care (Signed)
Immediate Anesthesia Transfer of Care Note  Patient: Alexis Lyons  Procedure(s) Performed: ATRIAL FIBRILLATION ABLATION  Patient Location: Cath Lab  Anesthesia Type:General  Level of Consciousness: drowsy and patient cooperative  Airway & Oxygen Therapy: Patient Spontanous Breathing and Patient connected to nasal cannula oxygen  Post-op Assessment: Report given to RN, Post -op Vital signs reviewed and stable and Patient moving all extremities  Post vital signs: Reviewed and stable  Last Vitals:  Vitals Value Taken Time  BP 136/57 11/02/21 1328  Temp    Pulse 67 11/02/21 1331  Resp 10 11/02/21 1331  SpO2 97 % 11/02/21 1331  Vitals shown include unvalidated device data.  Last Pain:  Vitals:   11/02/21 1330  TempSrc:   PainSc: 0-No pain         Complications: No notable events documented.

## 2021-11-02 NOTE — Anesthesia Procedure Notes (Signed)
Procedure Name: Intubation Date/Time: 11/02/2021 11:03 AM Performed by: Moshe Salisbury, CRNA Pre-anesthesia Checklist: Patient identified, Emergency Drugs available, Suction available and Patient being monitored Patient Re-evaluated:Patient Re-evaluated prior to induction Oxygen Delivery Method: Circle System Utilized Preoxygenation: Pre-oxygenation with 100% oxygen Induction Type: IV induction Ventilation: Mask ventilation without difficulty Laryngoscope Size: Mac and 3 Grade View: Grade II Tube type: Oral Tube size: 7.5 mm Number of attempts: 1 Airway Equipment and Method: Stylet Placement Confirmation: ETT inserted through vocal cords under direct vision, positive ETCO2 and breath sounds checked- equal and bilateral Secured at: 21 cm Tube secured with: Tape Dental Injury: Teeth and Oropharynx as per pre-operative assessment

## 2021-11-02 NOTE — Discharge Instructions (Signed)

## 2021-11-03 ENCOUNTER — Telehealth: Payer: Self-pay | Admitting: Cardiology

## 2021-11-03 ENCOUNTER — Encounter (HOSPITAL_COMMUNITY): Payer: Self-pay | Admitting: Cardiology

## 2021-11-03 NOTE — Telephone Encounter (Signed)
Returned call to patient regarding her call.  She has not missed any of her blood thinner and is aware to continue.  Discussed her call with Dr. Quentin Ore who advises if this happens again she will need to call her eye doctor to be seen.  She verbalized understanding and thanked me for the quick return call.

## 2021-11-03 NOTE — Telephone Encounter (Signed)
Pt had ablation yesterday, today she lost vision in her right eye for a few minutes. Pt wants to know if this is normal after having an  Ablation or unrelated.

## 2021-11-06 DIAGNOSIS — G43109 Migraine with aura, not intractable, without status migrainosus: Secondary | ICD-10-CM | POA: Diagnosis not present

## 2021-11-06 DIAGNOSIS — H0288A Meibomian gland dysfunction right eye, upper and lower eyelids: Secondary | ICD-10-CM | POA: Diagnosis not present

## 2021-11-06 DIAGNOSIS — H04123 Dry eye syndrome of bilateral lacrimal glands: Secondary | ICD-10-CM | POA: Diagnosis not present

## 2021-11-06 DIAGNOSIS — H0288B Meibomian gland dysfunction left eye, upper and lower eyelids: Secondary | ICD-10-CM | POA: Diagnosis not present

## 2021-11-06 DIAGNOSIS — H26493 Other secondary cataract, bilateral: Secondary | ICD-10-CM | POA: Diagnosis not present

## 2021-11-30 ENCOUNTER — Ambulatory Visit (HOSPITAL_COMMUNITY)
Admission: RE | Admit: 2021-11-30 | Discharge: 2021-11-30 | Disposition: A | Discharge status: Home / Self Care | Payer: Medicare HMO | Source: Ambulatory Visit | Attending: Nurse Practitioner | Admitting: Nurse Practitioner

## 2021-11-30 ENCOUNTER — Other Ambulatory Visit: Payer: Self-pay

## 2021-11-30 VITALS — BP 118/76 | HR 59 | Ht 63.0 in | Wt 119.2 lb

## 2021-11-30 DIAGNOSIS — I498 Other specified cardiac arrhythmias: Secondary | ICD-10-CM | POA: Insufficient documentation

## 2021-11-30 DIAGNOSIS — I10 Essential (primary) hypertension: Secondary | ICD-10-CM | POA: Insufficient documentation

## 2021-11-30 DIAGNOSIS — D6869 Other thrombophilia: Secondary | ICD-10-CM

## 2021-11-30 DIAGNOSIS — I480 Paroxysmal atrial fibrillation: Secondary | ICD-10-CM | POA: Insufficient documentation

## 2021-11-30 DIAGNOSIS — Z7901 Long term (current) use of anticoagulants: Secondary | ICD-10-CM | POA: Insufficient documentation

## 2021-11-30 NOTE — Progress Notes (Signed)
Primary Care Physician: Virginia Crews, MD Referring Physician: Dr. Jen Mow is a 80 y.o. female with a h/o HTN, PAF, that had a afib ablation one month ago. She reports no afib awareness since the procedure. No swallowing or groin issues. Is back to her usual activities. Being compliant with anticoagulation.    Today, she denies symptoms of palpitations, chest pain, shortness of breath, orthopnea, PND, lower extremity edema, dizziness, presyncope, syncope, or neurologic sequela. The patient is tolerating medications without difficulties and is otherwise without complaint today.   Past Medical History:  Diagnosis Date   Amnesia    Aphthae    Atrial fibrillation ([place])    Congestion-fibrosis syndrome    DD (diverticular disease)    GERD (gastroesophageal reflux disease)    Seizures ([place]) 12/2017   passed out due to dehydration   Past Surgical History:  Procedure Laterality Date   ATRIAL FIBRILLATION ABLATION N/A 11/02/2021   Procedure: [place];  Surgeon: Vickie Epley, MD;  Location: [facility];  Service: Cardiovascular;  Laterality: N/A;   CHOLECYSTECTOMY     colonoscopy with polypectomy     8/11 4 mm polyp, 12 mm polyp   COLONOSCOPY WITH PROPOFOL N/A 02/24/2018   Procedure: COLONOSCOPY WITH PROPOFOL;  Surgeon: Manya Silvas, MD;  Location: [facility] ENDOSCOPY;  Service: Endoscopy;  Laterality: N/A;   HERNIA REPAIR Right 2004   inguinal    HERNIA REPAIR     x's 3    Current Outpatient Medications  Medication Sig Dispense Refill   apixaban (ELIQUIS) 5 MG TABS tablet Take 1 tablet (5 mg total) by mouth 2 (two) times daily. 180 tablet 1   Ginseng 100 MG CAPS Take 100 mg by mouth 4 (four) times a week.     hydrochlorothiazide (HYDRODIURIL) 25 MG tablet TAKE 1 TABLET BY MOUTH ONCE DAILY 90 tablet 1   tretinoin (RETIN-A) 0.05 % cream Apply 1 application topically See admin instructions. Every other night     zolpidem  (AMBIEN) 5 MG tablet TAKE 1/2 TABLET BY MOUTH AT BEDTIME AS NEEDED SLEEP (Patient taking differently: Take 1.666 mg by mouth at bedtime as needed for sleep.) 15 tablet 5   No current facility-administered medications for this encounter.    Allergies  Allergen Reactions   Codeine Other (See Comments)    Messed up head really bad    Social History   Socioeconomic History   Marital status: Divorced    Spouse name: Not on file   Number of children: 2   Years of education: 15   Highest education level: Not on file  Occupational History   Occupation: [employer] in [place] Use   Smoking status: Former    Packs/day: 0.50    Years: 15.00    Pack years: 7.50    Types: Cigarettes    Quit date: 12/16/1985    Years since quitting: 35.9   Smokeless tobacco: Never  Vaping Use   Vaping Use: Never used  Substance and Sexual Activity   Alcohol use: Yes    Alcohol/week: 7.0 standard drinks    Types: 7 Glasses of wine per week    Comment: glass of wine with dinner   Drug use: No   Sexual activity: Not Currently  Other Topics Concern   Not on file  Social History Narrative   Not on file   Social Determinants of Health   Financial Resource Strain: Not on file  Food Insecurity:  Not on file  Transportation Needs: Not on file  Physical Activity: Not on file  Stress: Not on file  Social Connections: Not on file  Intimate Partner Violence: Not on file    Family History  Problem Relation Age of Onset   Alzheimer's disease Mother    Alcohol abuse Father    Cancer Father        liver cancer   Cancer Sister        breast   Breast cancer Sister     ROS- All systems are reviewed and negative except as per the HPI above  Physical Exam: Vitals:   11/30/21 1106  BP: 118/76  Pulse: (!) 59  Weight: 54.1 kg  Height: 5\' 3"  (1.6 m)   Wt Readings from Last 3 Encounters:  11/30/21 54.1 kg  11/02/21 53.1 kg  10/09/21 55 kg    Labs: Lab Results  Component Value Date   NA  138 10/18/2021   K 3.8 10/18/2021   CL 97 10/18/2021   CO2 26 10/18/2021   GLUCOSE 92 10/18/2021   BUN 11 10/18/2021   CREATININE 0.82 10/18/2021   CALCIUM 9.7 10/18/2021   PHOS 3.2 12/23/2017   No results found for: INR Lab Results  Component Value Date   CHOL 196 11/07/2020   HDL 78 11/07/2020   LDLCALC 108 (H) 11/07/2020   TRIG 53 11/07/2020     GEN- The patient is well appearing, alert and oriented x 3 today.   Head- normocephalic, atraumatic Eyes-  Sclera clear, conjunctiva pink Ears- hearing intact Oropharynx- clear Neck- supple, no JVP Lymph- no cervical lymphadenopathy Lungs- Clear to ausculation bilaterally, normal work of breathing Heart- Regular rate and rhythm, no murmurs, rubs or gallops, PMI not laterally displaced GI- soft, NT, ND, + BS Extremities- no clubbing, cyanosis, or edema MS- no significant deformity or atrophy Skin- no rash or lesion Psych- euthymic mood, full affect Neuro- strength and sensation are intact  EKG-sinus brady at 59 bpm, pr int 166 ms, qrs int 78 ms, qtc 421 ms    Assessment and Plan:  1. Afib  S/p ablation x one month  She  is doing well No swallowing or groin issues   2. HTN  Stable   3. CHA2DS2VASc  score of 4 Continue eliquis 5 mg bid  Reminded not to interrupt anticoagulation    F/u with Dr. Quentin Ore per 3 month recall    Geroge Baseman. Carmaleta Youngers, [facility] [address] [place], [place] [address] [phone]

## 2021-12-01 DIAGNOSIS — D485 Neoplasm of uncertain behavior of skin: Secondary | ICD-10-CM | POA: Diagnosis not present

## 2021-12-01 DIAGNOSIS — L720 Epidermal cyst: Secondary | ICD-10-CM | POA: Diagnosis not present

## 2021-12-01 DIAGNOSIS — L820 Inflamed seborrheic keratosis: Secondary | ICD-10-CM | POA: Diagnosis not present

## 2021-12-01 NOTE — H&P (Signed)
Electrophysiology Office Note:     Date:  11/02/2021    ID:  Alexis Lyons, DOB 03-08-1941, MRN [id number]   PCP:  Virginia Crews, MD  [facility] Cardiologist:  Kate Sable, MD  [facility] Electrophysiologist:  Vickie Epley, MD    Referring MD: Kate Sable, MD    Chief Complaint: AF   History of Present Illness:     Alexis Lyons is a 80 y.o. female who presents for an evaluation of AF at the request of Dr Garen Lah. Their medical history includes GERD, diverticulosis and AF.   She presents for AF ablatoin today.          Past Medical History:  Diagnosis Date   Amnesia     Aphthae     Atrial fibrillation ([place])     Congestion-fibrosis syndrome     DD (diverticular disease)     GERD (gastroesophageal reflux disease)     Seizures ([place]) 12/2017    passed out due to dehydration               Past Surgical History:  Procedure Laterality Date   CHOLECYSTECTOMY       colonoscopy with polypectomy        8/11 4 mm polyp, 12 mm polyp   COLONOSCOPY WITH PROPOFOL N/A 02/24/2018    Procedure: COLONOSCOPY WITH PROPOFOL;  Surgeon: Manya Silvas, MD;  Location: [facility] ENDOSCOPY;  Service: Endoscopy;  Laterality: N/A;   HERNIA REPAIR Right 2004    inguinal    HERNIA REPAIR        x's 3      Current Medications: Active Medications         Current Meds  Medication Sig   apixaban (ELIQUIS) 5 MG TABS tablet Take 1 tablet (5 mg total) by mouth 2 (two) times daily.   Ginseng 100 MG CAPS every other day.    zolpidem (AMBIEN) 5 MG tablet TAKE 1/2 TABLET BY MOUTH AT BEDTIME AS NEEDED SLEEP   [DISCONTINUED] diltiazem (CARDIZEM CD) 120 MG 24 hr capsule Take 1 capsule (120 mg total) by mouth daily.        Allergies:   Codeine    Social History             Socioeconomic History   Marital status: Divorced      Spouse name: Not on file   Number of children: 2   Years of education: 15   Highest education level: Not on file  Occupational  History   Occupation: [employer] in [place] Use   Smoking status: Former      Packs/day: 0.50      Years: 15.00      Pack years: 7.50      Types: Cigarettes      Quit date: 12/16/1985      Years since quitting: 35.7   Smokeless tobacco: Never  Vaping Use   Vaping Use: Never used  Substance and Sexual Activity   Alcohol use: Yes      Alcohol/week: 7.0 standard drinks      Types: 7 Glasses of wine per week      Comment: glass of wine with dinner   Drug use: No   Sexual activity: Not Currently  Other Topics Concern   Not on file  Social History Narrative   Not on file    Social Determinants of Health    Financial Resource Strain: Not on file  Food Insecurity: Not on file  Transportation Needs: Not on file  Physical Activity: Not on file  Stress: Not on file  Social Connections: Not on file      Family History: The patient's family history includes Alcohol abuse in her father; Alzheimer's disease in her mother; Breast cancer in her sister; Cancer in her father and sister.   ROS:   Please see the history of present illness.    All other systems reviewed and are negative.   EKGs/Labs/Other Studies Reviewed:     The following studies were reviewed today:     Apr 27, 2021 Echo personally reviewed EF normal, 60% RV normal Mild-moderate TR             EKG:  The ekg ordered today demonstrates sinus bradycardia   Recent Labs: 03/14/2021: ALT 18 08/18/2021: BUN 16; Creatinine, Ser 0.78; Hemoglobin 16.4; Platelets 280; Potassium 3.4; Sodium 136  Recent Lipid Panel Labs (Brief)              Component Value Date/Time    CHOL 196 11/07/2020 0849    TRIG 53 11/07/2020 0849    HDL 78 11/07/2020 0849    CHOLHDL 2.5 11/07/2020 0849    LDLCALC 108 (H) 11/07/2020 0849        Physical Exam:     VS:  BP (!) 146/96 (BP Location: Left Arm, Patient Position: Sitting, Cuff Size: Normal)    Pulse (!) 59    Ht 5' 3.5" (1.613 m)    Wt 121 lb (54.9 kg)    SpO2 96%    BMI  21.10 kg/m           Wt Readings from Last 3 Encounters:  09/13/21 121 lb (54.9 kg)  08/28/21 117 lb (53.1 kg)  08/18/21 116 lb (52.6 kg)      GEN:  Well nourished, well developed in no acute distress. Appears younger than stated age. HEENT: Normal NECK: No JVD; No carotid bruits LYMPHATICS: No lymphadenopathy CARDIAC: RRR, no murmurs, rubs, gallops RESPIRATORY:  Clear to auscultation without rales, wheezing or rhonchi  ABDOMEN: Soft, non-tender, non-distended MUSCULOSKELETAL:  No edema; No deformity  SKIN: Warm and dry NEUROLOGIC:  Alert and oriented x 3 PSYCHIATRIC:  Normal affect    ASSESSMENT:     1. Paroxysmal atrial fibrillation (HCC)   2. Primary hypertension   3. Atrial fibrillation, unspecified type ([place])     PLAN:     In order of problems listed above:     1. Paroxysmal atrial fibrillation (HCC) Highly symptomatic paroxysms of AF. I discussed the options for managing her AF including continued conservative management, rhythm control using antiarrhythmic therapy and ablation therapy. I discussed the efficacy of each option. She would like to proceed with an ablation.   Risk, benefits, and alternatives to EP study and radiofrequency ablation for afib were also discussed in detail today. These risks include but are not limited to stroke, bleeding, vascular damage, tamponade, perforation, damage to the esophagus, lungs, and other structures, pulmonary vein stenosis, worsening renal function, and death. The patient understands these risk and wishes to proceed.  We will therefore proceed with catheter ablation at the next available time.  [place], ICE, anesthesia are requested for the procedure.       Vickie Epley, MD 11/02/2021 10:29 AM

## 2021-12-06 DIAGNOSIS — H0288A Meibomian gland dysfunction right eye, upper and lower eyelids: Secondary | ICD-10-CM | POA: Diagnosis not present

## 2021-12-06 DIAGNOSIS — H5203 Hypermetropia, bilateral: Secondary | ICD-10-CM | POA: Diagnosis not present

## 2021-12-06 DIAGNOSIS — H04123 Dry eye syndrome of bilateral lacrimal glands: Secondary | ICD-10-CM | POA: Diagnosis not present

## 2021-12-06 DIAGNOSIS — H0288B Meibomian gland dysfunction left eye, upper and lower eyelids: Secondary | ICD-10-CM | POA: Diagnosis not present

## 2021-12-15 ENCOUNTER — Other Ambulatory Visit: Payer: Self-pay | Admitting: Family Medicine

## 2021-12-15 NOTE — Telephone Encounter (Signed)
Requested Prescriptions  Pending Prescriptions Disp Refills   hydrochlorothiazide (HYDRODIURIL) 25 MG tablet [Pharmacy Med Name: HYDROCHLOROTHIAZIDE 25 MG TAB] 90 tablet 1    Sig: TAKE 1 TABLET BY MOUTH ONCE DAILY     Cardiovascular: Diuretics - Thiazide Passed - 12/15/2021 10:15 AM      Passed - Ca in normal range and within 360 days    Calcium  Date Value Ref Range Status  10/18/2021 9.7 8.7 - 10.3 mg/dL Final         Passed - Cr in normal range and within 360 days    Creatinine, Ser  Date Value Ref Range Status  10/18/2021 0.82 0.57 - 1.00 mg/dL Final         Passed - K in normal range and within 360 days    Potassium  Date Value Ref Range Status  10/18/2021 3.8 3.5 - 5.2 mmol/L Final         Passed - Na in normal range and within 360 days    Sodium  Date Value Ref Range Status  10/18/2021 138 134 - 144 mmol/L Final         Passed - Last BP in normal range    BP Readings from Last 1 Encounters:  11/30/21 118/76         Passed - Valid encounter within last 6 months    Recent Outpatient Visits          2 months ago Neuropathy   [facility] [place], Dionne Bucy, MD   3 months ago Tingling sensation   [place], Lauren A, NP   3 months ago PAF (paroxysmal atrial fibrillation) [facility])   [facility], Dionne Bucy, MD   6 months ago Essential hypertension   [place], Dionne Bucy, MD   11 months ago Swollen lip   [facility] [place], Vickki Muff, [place]

## 2022-01-09 ENCOUNTER — Encounter: Payer: Self-pay | Admitting: Family Medicine

## 2022-01-10 DIAGNOSIS — L720 Epidermal cyst: Secondary | ICD-10-CM | POA: Diagnosis not present

## 2022-01-10 DIAGNOSIS — D225 Melanocytic nevi of trunk: Secondary | ICD-10-CM | POA: Diagnosis not present

## 2022-01-10 DIAGNOSIS — D2272 Melanocytic nevi of left lower limb, including hip: Secondary | ICD-10-CM | POA: Diagnosis not present

## 2022-01-10 DIAGNOSIS — Z85828 Personal history of other malignant neoplasm of skin: Secondary | ICD-10-CM | POA: Diagnosis not present

## 2022-01-10 DIAGNOSIS — D2262 Melanocytic nevi of left upper limb, including shoulder: Secondary | ICD-10-CM | POA: Diagnosis not present

## 2022-01-10 DIAGNOSIS — L400 Psoriasis vulgaris: Secondary | ICD-10-CM | POA: Diagnosis not present

## 2022-01-10 DIAGNOSIS — D2271 Melanocytic nevi of right lower limb, including hip: Secondary | ICD-10-CM | POA: Diagnosis not present

## 2022-01-10 DIAGNOSIS — L570 Actinic keratosis: Secondary | ICD-10-CM | POA: Diagnosis not present

## 2022-01-11 ENCOUNTER — Encounter: Payer: Self-pay | Admitting: Family Medicine

## 2022-01-11 ENCOUNTER — Ambulatory Visit (INDEPENDENT_AMBULATORY_CARE_PROVIDER_SITE_OTHER): Payer: PPO | Admitting: Family Medicine

## 2022-01-11 ENCOUNTER — Other Ambulatory Visit: Payer: Self-pay

## 2022-01-11 VITALS — BP 130/80 | HR 73 | Temp 96.9°F | Ht 63.0 in | Wt 120.0 lb

## 2022-01-11 DIAGNOSIS — Z0000 Encounter for general adult medical examination without abnormal findings: Secondary | ICD-10-CM | POA: Diagnosis not present

## 2022-01-11 DIAGNOSIS — I10 Essential (primary) hypertension: Secondary | ICD-10-CM | POA: Diagnosis not present

## 2022-01-11 DIAGNOSIS — D692 Other nonthrombocytopenic purpura: Secondary | ICD-10-CM | POA: Diagnosis not present

## 2022-01-11 DIAGNOSIS — I480 Paroxysmal atrial fibrillation: Secondary | ICD-10-CM

## 2022-01-11 DIAGNOSIS — S46811A Strain of other muscles, fascia and tendons at shoulder and upper arm level, right arm, initial encounter: Secondary | ICD-10-CM | POA: Insufficient documentation

## 2022-01-11 DIAGNOSIS — R202 Paresthesia of skin: Secondary | ICD-10-CM | POA: Diagnosis not present

## 2022-01-11 DIAGNOSIS — Z1231 Encounter for screening mammogram for malignant neoplasm of breast: Secondary | ICD-10-CM

## 2022-01-11 DIAGNOSIS — Z7689 Persons encountering health services in other specified circumstances: Secondary | ICD-10-CM | POA: Diagnosis not present

## 2022-01-11 DIAGNOSIS — R200 Anesthesia of skin: Secondary | ICD-10-CM | POA: Diagnosis not present

## 2022-01-11 NOTE — Assessment & Plan Note (Signed)
New problem Benign exam Recommend rest, ice, tylenol, gentle HEP

## 2022-01-11 NOTE — Assessment & Plan Note (Signed)
Continue to monitor

## 2022-01-11 NOTE — Assessment & Plan Note (Signed)
Well controlled Continue current medications Reviewed recent metabolic panel F/u in 6 months  

## 2022-01-11 NOTE — Progress Notes (Signed)
Annual Wellness Visit     Patient: Alexis Lyons, Female    DOB: [birth year], 81 y.o.   MRN: [id number] Visit Date: 01/11/2022  Today's Provider: Lavon Paganini, MD   Chief Complaint  Patient presents with   Annual Exam   Subjective    Alexis Lyons is a 81 y.o. female who presents today for her Annual Wellness Visit. She reports consuming a general diet.  Exercises Regularly.  She generally feels well. She reports sleeping well. She does have additional problems to discuss today.   Arm Pain  Incident onset: Started a days ago. There was no injury mechanism. The pain is present in the upper right arm. The quality of the pain is described as aching. The pain does not radiate. She has tried nothing for the symptoms.  Was reaching for something on a high shelf before it started hurting   Medications: Outpatient Medications Prior to Visit  Medication Sig   apixaban (ELIQUIS) 5 MG TABS tablet Take 1 tablet (5 mg total) by mouth 2 (two) times daily.   Ginseng 100 MG CAPS Take 100 mg by mouth 4 (four) times a week.   hydrochlorothiazide (HYDRODIURIL) 25 MG tablet TAKE 1 TABLET BY MOUTH ONCE DAILY   tretinoin (RETIN-A) 0.05 % cream Apply 1 application topically See admin instructions. Every other night   zolpidem (AMBIEN) 5 MG tablet TAKE 1/2 TABLET BY MOUTH AT BEDTIME AS NEEDED SLEEP (Patient taking differently: Take 1.666 mg by mouth at bedtime as needed for sleep.)   No facility-administered medications prior to visit.    Allergies  Allergen Reactions   Codeine Other (See Comments)    Messed up head really bad    Patient Care Team: Virginia Crews, MD as PCP - General (Family Medicine) Kate Sable, MD as PCP - Cardiology (Cardiology) Vickie Epley, MD as PCP - Electrophysiology (Cardiology)  Review of Systems  Constitutional: Negative.   HENT: Negative.    Eyes: Negative.   Respiratory: Negative.    Cardiovascular: Negative.    Gastrointestinal: Negative.   Endocrine: Negative.   Genitourinary: Negative.   Musculoskeletal:  Positive for myalgias. Negative for arthralgias, back pain, gait problem, joint swelling, neck pain and neck stiffness.  Skin: Negative.   Allergic/Immunologic: Negative.   Neurological: Negative.   Hematological: Negative.   Psychiatric/Behavioral: Negative.         Objective    Vitals: BP 130/80 (BP Location: Left Arm, Patient Position: Sitting, Cuff Size: Normal)    Pulse 73    Temp (!) 96.9 F (36.1 C) (Temporal)    Ht 5\' 3"  (1.6 m)    Wt 120 lb (54.4 kg)    SpO2 96%    BMI 21.26 kg/m    Physical Exam Vitals reviewed.  Constitutional:      General: She is not in acute distress.    Appearance: Normal appearance. She is well-developed. She is not diaphoretic.  HENT:     Head: Normocephalic and atraumatic.     Right Ear: Tympanic membrane, ear canal and external ear normal.     Left Ear: Tympanic membrane, ear canal and external ear normal.     Nose: Nose normal.     Mouth/Throat:     Mouth: Mucous membranes are moist.     Pharynx: Oropharynx is clear. No oropharyngeal exudate.  Eyes:     General: No scleral icterus.    Conjunctiva/sclera: Conjunctivae normal.     Pupils: Pupils are equal, round, and  reactive to light.  Neck:     Thyroid: No thyromegaly.  Cardiovascular:     Rate and Rhythm: Normal rate and regular rhythm.     Pulses: Normal pulses.     Heart sounds: Normal heart sounds. No murmur heard. Pulmonary:     Effort: Pulmonary effort is normal. No respiratory distress.     Breath sounds: Normal breath sounds. No wheezing or rales.  Abdominal:     General: There is no distension.     Palpations: Abdomen is soft.     Tenderness: There is no abdominal tenderness.  Musculoskeletal:        General: No deformity.     Cervical back: Neck supple.     Right lower leg: No edema.     Left lower leg: No edema.     Comments: R shoulder: Normal ROM, mild TTP over R  deltoid  Lymphadenopathy:     Cervical: No cervical adenopathy.  Skin:    General: Skin is warm and dry.     Findings: No rash.  Neurological:     Mental Status: She is alert and oriented to person, place, and time. Mental status is at baseline.     Sensory: No sensory deficit.     Motor: No weakness.     Gait: Gait normal.  Psychiatric:        Mood and Affect: Mood normal.        Behavior: Behavior normal.        Thought Content: Thought content normal.     Most recent functional status assessment: In your present state of health, do you have any difficulty performing the following activities: 01/11/2022  Hearing? N  Vision? N  Difficulty concentrating or making decisions? N  Walking or climbing stairs? N  Dressing or bathing? N  Doing errands, shopping? N  Some recent data might be hidden   Most recent fall risk assessment: Fall Risk  01/11/2022  Falls in the past year? 0  Number falls in past yr: 0  Injury with Fall? 0  Risk for fall due to : No Fall Risks  Follow up Falls evaluation completed    Most recent depression screenings: PHQ 2/9 Scores 01/11/2022 10/09/2021  PHQ - 2 Score 0 0  PHQ- 9 Score 0 0   Most recent cognitive screening: 6CIT Screen 01/11/2022  What Year? 0 points  What month? 0 points  What time? 0 points  Count back from 20 0 points  Months in reverse 0 points  Repeat phrase 0 points  Total Score 0   Most recent Audit-C alcohol use screening Alcohol Use Disorder Test (AUDIT) 01/11/2022  1. How often do you have a drink containing alcohol? 3  2. How many drinks containing alcohol do you have on a typical day when you are drinking? 0  3. How often do you have six or more drinks on one occasion? 0  AUDIT-C Score 3  4. How often during the last year have you found that you were not able to stop drinking once you had started? -  5. How often during the last year have you failed to do what was normally expected from you because of drinking? -  6.  How often during the last year have you needed a first drink in the morning to get yourself going after a heavy drinking session? -  7. How often during the last year have you had a feeling of guilt of remorse after drinking? -  8. How often during the last year have you been unable to remember what happened the night before because you had been drinking? -  9. Have you or someone else been injured as a result of your drinking? -  10. Has a relative or friend or a doctor or another health worker been concerned about your drinking or suggested you cut down? -  Alcohol Use Disorder Identification Test Final Score (AUDIT) -  Alcohol Brief Interventions/Follow-up -   A score of 3 or more in women, and 4 or more in men indicates increased risk for alcohol abuse, EXCEPT if all of the points are from question 1   No results found for any visits on 01/11/22.  Assessment & Plan     Annual wellness visit done today including the all of the following: Reviewed patient's Family Medical History Reviewed and updated list of patient's medical providers Assessment of cognitive impairment was done Assessed patient's functional ability Established a written schedule for health screening [place] Completed and Reviewed  Exercise Activities and Dietary recommendations  Goals   None     Immunization History  Administered Date(s) Administered   Hepatitis A 07/26/2015   PFIZER(Purple Top)SARS-COV-2 Vaccination 01/06/2020, 01/25/2020, 12/28/2020   Pneumococcal Conjugate-13 07/29/2019   Pneumococcal Polysaccharide-23 01/01/2011   Td 06/23/2008   Tdap 11/30/2019   Typhoid Inactivated 07/26/2015   Zoster Recombinat (Shingrix) 06/25/2019, 10/06/2019    Health Maintenance  Topic Date Due   COVID-19 Vaccine (4 - Booster for Pfizer series) 02/22/2021   INFLUENZA VACCINE  03/16/2022 (Originally 07/17/2021)   DEXA SCAN  10/09/2022 (Originally 07/02/2013)   TETANUS/TDAP  11/29/2029    Pneumonia Vaccine 85+ Years old  Completed   Zoster Vaccines- Shingrix  Completed   HPV VACCINES  Aged Out     Discussed health benefits of physical activity, and encouraged her to engage in regular exercise appropriate for her age and condition.    Problem List Items Addressed This Visit       Cardiovascular and Mediastinum   Essential hypertension    Well controlled Continue current medications Reviewed recent metabolic panel F/u in 6 months       PAF (paroxysmal atrial fibrillation) ([place])    S/p ablation Doing well in NSR today Continue eliquis Reviewed recent labs      Senile purpura ([place])    Continue to monitor        Musculoskeletal and Integument   Strain of right deltoid muscle    New problem Benign exam Recommend rest, ice, tylenol, gentle HEP      Other Visit Diagnoses     Encounter for annual wellness visit (AWV) in Medicare patient    -  Primary   Encounter for annual physical exam       Screening mammogram for breast cancer       Relevant Orders   MM 3D SCREEN BREAST BILATERAL      Declines lipid panel today   Return in about 6 months (around 07/11/2022) for chronic disease f/u.     I, Lavon Paganini, MD, have reviewed all documentation for this visit. The documentation on 01/11/22 for the exam, diagnosis, procedures, and orders are all accurate and complete.   Dorise Gangi, Dionne Bucy, MD, MPH [employer] [facility]

## 2022-01-11 NOTE — Assessment & Plan Note (Signed)
S/p ablation Doing well in NSR today Continue eliquis Reviewed recent labs

## 2022-01-18 ENCOUNTER — Other Ambulatory Visit: Payer: Self-pay

## 2022-01-18 MED ORDER — APIXABAN 5 MG PO TABS: 5.0000 mg | ORAL_TABLET | Freq: Two times a day (BID) | ORAL | 1 refills | Status: DC

## 2022-01-18 NOTE — Telephone Encounter (Signed)
Received fax from [facility] in [place] requesting refill of Eliquis.  [facility] [address]  [name] resend to correct pharmacy.

## 2022-01-18 NOTE — Telephone Encounter (Signed)
Prescription refill request for Eliquis received. Indication: PAF Last office visit: 11/30/21  Maximino Greenland NP Scr: 0.82 on 10/18/21 Age: 81 Weight: 54.1kg  Based on above findings Eliquis 5mg  twice daily is the appropriate dose.  Refill approved.

## 2022-01-18 NOTE — Telephone Encounter (Signed)
*  STAT* If patient is at the pharmacy, call can be transferred to refill team.   1. Which medications need to be refilled? (please list name of each medication and dose if known) Eliquis  2. Which pharmacy/location (including street and city if local pharmacy) is medication to be sent to? [facility]  3. Do they need a 30 day or 90 day supply? [place]

## 2022-01-18 NOTE — Telephone Encounter (Signed)
Refill Request.  

## 2022-01-18 NOTE — Addendum Note (Signed)
Addended by: Brynda Peon on: 01/18/2022 11:57 AM   Modules accepted: Orders

## 2022-03-07 DIAGNOSIS — L708 Other acne: Secondary | ICD-10-CM | POA: Diagnosis not present

## 2022-03-19 ENCOUNTER — Other Ambulatory Visit: Payer: Self-pay | Admitting: Family Medicine

## 2022-03-19 NOTE — Telephone Encounter (Signed)
Pt is going out of town and needs refill today. ?

## 2022-03-26 ENCOUNTER — Other Ambulatory Visit: Payer: Self-pay | Admitting: Family Medicine

## 2022-03-26 DIAGNOSIS — F5102 Adjustment insomnia: Secondary | ICD-10-CM

## 2022-04-03 ENCOUNTER — Ambulatory Visit
Admission: RE | Admit: 2022-04-03 | Discharge: 2022-04-03 | Disposition: A | Discharge status: Home / Self Care | Payer: PPO | Source: Ambulatory Visit | Attending: Physician Assistant | Admitting: Physician Assistant

## 2022-04-03 ENCOUNTER — Ambulatory Visit
Admission: RE | Admit: 2022-04-03 | Discharge: 2022-04-03 | Disposition: A | Discharge status: Home / Self Care | Payer: PPO | Attending: Physician Assistant | Admitting: Physician Assistant

## 2022-04-03 ENCOUNTER — Ambulatory Visit (INDEPENDENT_AMBULATORY_CARE_PROVIDER_SITE_OTHER): Payer: PPO | Admitting: Physician Assistant

## 2022-04-03 ENCOUNTER — Ambulatory Visit: Payer: Self-pay | Admitting: *Deleted

## 2022-04-03 ENCOUNTER — Encounter: Payer: Self-pay | Admitting: Physician Assistant

## 2022-04-03 VITALS — BP 140/80 | HR 68 | Temp 97.6°F | Resp 16 | Ht 63.5 in | Wt 118.6 lb

## 2022-04-03 DIAGNOSIS — M1712 Unilateral primary osteoarthritis, left knee: Secondary | ICD-10-CM | POA: Diagnosis not present

## 2022-04-03 DIAGNOSIS — M25562 Pain in left knee: Secondary | ICD-10-CM

## 2022-04-03 NOTE — Telephone Encounter (Signed)
?  Chief Complaint: L knee injury ?Symptoms: reports protrusion- half dollar size, some pain ?Frequency: started yesterday ?Pertinent Negatives: Patient denies pop" when knee injured, swelling, locking, buckling ?Disposition: '[]'$ ED /'[]'$ Urgent Care (no appt availability in office) / '[x]'$ Appointment(In office/virtual)/ '[]'$  [place] [facility]/ '[]'$ Home Care/ '[]'$ Refused Recommended Disposition /'[]'$ [place] [facility]/ '[]'$  Follow-up with PCP ?Additional Notes:    ?

## 2022-04-03 NOTE — Progress Notes (Signed)
?  ? ?I,Joseline E Rosas,acting as a scribe for [employer], PA-C.,have documented all relevant documentation on the behalf of [place], PA-C,as directed by  [employer], PA-C while in the presence of Aerika Groll E Tava Peery, PA-C.  ? ?Established patient visit ? ? ?Patient: Alexis Lyons   DOB: 1941-08-26   81 y.o. Female  MRN: [id number] ?Visit Date: 04/03/2022 ? ?Today's healthcare provider: Dani Gobble Britney Newstrom, PA-C  ?Introduced myself to the patient as a Journalist, newspaper and provided education on APPs in clinical practice.  ? ? ?Chief Complaint  ?Patient presents with  ? Knee Injury  ? ?Subjective  ?  ?HPI  ?Knee Pain: Patient presents with a knee injury involving the  left knee. Onset of the symptoms was yesterday. Inciting event: injured while trying to break in new cushions (overuse) . Current symptoms include  hard protrusion with pain radiating up to her left buttock . Pain is aggravated by walking.  Patient has had no prior knee problems. Evaluation to date: none. Treatment to date: none. She put ice last night and took Tylenol this morning. ? ? ?States she was jumping on her cough cushions to break them in after reupholstering ?She then went for a walk for about an hour, after this she noted pain in her left medial knee  ?States she could feel "the bone protruding"  ?Reports when she got to office and exited her car she felt a twinge and now her left lower back is hurting as well ? ?Reports numbness and tingling in her bilateral shins and left foot ?States she was taking Tylenol but this is not providing relief  ? ? ?Medications: ?Outpatient Medications Prior to Visit  ?Medication Sig  ? apixaban (ELIQUIS) 5 MG TABS tablet Take 1 tablet (5 mg total) by mouth 2 (two) times daily.  ? Ginseng 100 MG CAPS Take 100 mg by mouth 4 (four) times a week.  ? hydrochlorothiazide (HYDRODIURIL) 25 MG tablet TAKE 1 TABLET BY MOUTH ONCE DAILY  ? tretinoin (RETIN-A) 0.05 % cream Apply 1 application topically See admin instructions. Every  other night  ? zolpidem (AMBIEN) 5 MG tablet TAKE 1/2 TABLET BY MOUTH AT BEDTIME AS NEEDED SLEEP  ? ?No facility-administered medications prior to visit.  ? ? ?Review of Systems  ?Musculoskeletal:  Positive for arthralgias.  ?Neurological:  Positive for numbness. Negative for weakness.  ? ? ?  Objective  ?  ?BP 140/80 (BP Location: Left Arm, Patient Position: Sitting, Cuff Size: Normal)   Pulse 68   Temp 97.6 ?F (36.4 ?C) (Oral)   Resp 16   Ht 5' 3.5" (1.613 m)   Wt 118 lb 9.6 oz (53.8 kg)   BMI 20.68 kg/m?  ? ? ?Physical Exam ?Vitals reviewed.  ?Constitutional:   ?   General: She is awake.  ?   Appearance: Normal appearance. She is well-developed, well-groomed and normal weight.  ?HENT:  ?   Head: Normocephalic and atraumatic.  ?Cardiovascular:  ?   Pulses:     ?     Dorsalis pedis pulses are 2+ on the right side and 2+ on the left side.  ?     Posterior tibial pulses are 2+ on the right side and 2+ on the left side.  ?Pulmonary:  ?   Effort: Pulmonary effort is normal.  ?Musculoskeletal:  ?   Right knee: Crepitus present. No swelling, deformity, effusion, erythema or ecchymosis. Normal range of motion. No tenderness. No LCL laxity, MCL laxity,  ACL laxity or PCL laxity.  ?   Instability Tests: Anterior drawer test negative. Posterior drawer test negative. Medial McMurray test negative and lateral McMurray test negative.  ?   Left knee: Deformity and crepitus present. No effusion, erythema or ecchymosis. Normal range of motion. Tenderness present over the medial joint line. No LCL laxity, MCL laxity, ACL laxity or PCL laxity.Normal meniscus.  ?   Instability Tests: Anterior drawer test negative. Posterior drawer test negative. Medial McMurray test negative and lateral McMurray test negative.  ?   Right lower leg: No edema.  ?   Left lower leg: No edema.  ?   Right foot: Normal pulse.  ?   Left foot: Normal pulse.  ?Neurological:  ?   General: No focal deficit present.  ?   Mental Status: She is alert and  oriented to person, place, and time.  ?   GCS: GCS eye subscore is 4. GCS verbal subscore is 5. GCS motor subscore is 6.  ?Psychiatric:     ?   Attention and Perception: Attention normal.     ?   Mood and Affect: Mood normal.     ?   Speech: Speech normal.     ?   Behavior: Behavior normal. Behavior is cooperative.  ?  ? ? ?No results found for any visits on 04/03/22. ? Assessment & Plan  ?  ? ?Problem List Items Addressed This Visit   ?None ?Visit Diagnoses   ? ? Acute pain of left knee    -  Primary ?Acute, new problem ?Patient reports she was jumping on couch cushions, went for walk for an hour then developed right medial knee pain and swelling ?Exam reassuring for MCL, LCL, ACL, PCL and meniscus integrity ?Concerned for medial nodularity and tenderness ?Will order imaging to assess for bony injury  ?Recommend conservative management pending imaging: warm compresses, tylenol, knee brace, topical lidocaine or icyhot for additional relief ?Results to dictate if Ortho referral is required ?Follow up as needed for persistent or worsening symptoms ?  ? Relevant Orders  ? DG Knee Complete 4 Views Left  ? ?  ? ? ? ?No follow-ups on file. ? ? ?I, Erinn Huskins E Shiva Sahagian, PA-C, have reviewed all documentation for this visit. The documentation on 04/03/22 for the exam, diagnosis, procedures, and orders are all accurate and complete. ? ? ?Adara Kittle, MHS, PA-C ?[facility] ?[facility]  ? ?

## 2022-04-03 NOTE — Telephone Encounter (Signed)
Reason for Disposition ? [1] High-risk adult (e.g., age > 50 years, osteoporosis, chronic steroid use) AND [2] limping ? ?Answer Assessment - Initial Assessment Questions ?1. MECHANISM: "How did the injury happen?" (e.g., twisting injury, direct blow)  ?    Left knee injury- overuse- trying to break in new cushions  ?2. ONSET: "When did the injury happen?" (Minutes or hours ago)  ?    yesterday ?3. LOCATION: "Where is the injury located?"  ?    Left knee- under knee cap- inner leg ?4. APPEARANCE of INJURY: "What does the injury look like?"  ?    Hard protrusion ?5. SEVERITY: "Can you put weight on that leg?" "Can you walk?"  ?    yes ?6. SIZE: For cuts, bruises, or swelling, ask: "How large is it?" (e.g., inches or centimeters;  entire joint)  ?    Half dollar size ?7. PAIN: "Is there pain?" If Yes, ask: "How bad is the pain?"  "What does it keep you from doing?" (e.g., Scale 1-10; or mild, moderate, severe) ?  -  NONE: (0): no pain ?  -  MILD (1-3): doesn't interfere with normal activities  ?  -  MODERATE (4-7): interferes with normal activities (e.g., work or school) or awakens from sleep, limping  ?  -  SEVERE (8-10): excruciating pain, unable to do any normal activities, unable to walk ?    moderate ?8. TETANUS: For any breaks in the skin, ask: "When was the last tetanus booster?" ?    na ?9. OTHER SYMPTOMS: "Do you have any other symptoms?"  (e.g., "pop" when knee injured, swelling, locking, buckling)  ?    no ?10. PREGNANCY: "Is there any chance you are pregnant?" "When was your last menstrual period?" ?      na ? ?Protocols used: Knee Injury-A-AH ? ?

## 2022-04-03 NOTE — Patient Instructions (Addendum)
I am ordering an xray of your left knee to assess for any damage to the bones and joint ?I recommend the following while we wait for these results: ?Use a knee brace that helps support the joint ?Use warm compresses to the area ?Continue to take Tylenol for pain ?You can use Icyhot or a Lidocaine patch that are available over the counter ? ?We will keep you updated with the results as they are available ?It was nice to meet you and I appreciate the opportunity to be involved in your care ? ?

## 2022-05-07 ENCOUNTER — Ambulatory Visit: Payer: Self-pay | Admitting: *Deleted

## 2022-05-07 NOTE — Progress Notes (Unsigned)
    I,Zniyah Midkiff J Alix Lahmann,acting as a scribe for Myles Gip, DO.,have documented all relevant documentation on the behalf of Myles Gip, DO,as directed by  Myles Gip, DO while in the presence of Myles Gip, DO.   Established patient visit   Patient: Alexis Lyons   DOB: 09-17-1941   80 y.o. Female  MRN: [id number] Visit Date: 05/08/2022  Today's healthcare provider: Myles Gip, DO   Chief Complaint  Patient presents with   Back Pain   Subjective    HPI  Back Pain  She reports chronic back pain. There was not an injury that may have caused the pain but did a lot of walking in new shoes on vacation, returned last Monday. The most recent episode started a few years ago and is gradually worsening. The pain is located in the left lumbar area with radiation to L buttock. It is described as stabbing, is 3/10 in intensity, occurring intermittently.    Relieving factors: sitting.  She has tried application of ice, acetaminophen, and NSAIDs with little relief. Felt tylenol made her sleepy.  Associated symptoms: No abdominal pain No bowel incontinence  No chest pain No dysuria   No fever No headaches  No joint pains No pelvic pain  No weakness in leg  No tingling in lower extremities  No urinary incontinence No weight loss    -----------------------------------------------------------------------------------------   Medications: Outpatient Medications Prior to Visit  Medication Sig   apixaban (ELIQUIS) 5 MG TABS tablet Take 1 tablet (5 mg total) by mouth 2 (two) times daily.   Ginseng 100 MG CAPS Take 100 mg by mouth 4 (four) times a week.   hydrochlorothiazide (HYDRODIURIL) 25 MG tablet TAKE 1 TABLET BY MOUTH ONCE DAILY   tretinoin (RETIN-A) 0.05 % cream Apply 1 application topically See admin instructions. Every other night   zolpidem (AMBIEN) 5 MG tablet TAKE 1/2 TABLET BY MOUTH AT BEDTIME AS NEEDED SLEEP   No facility-administered medications prior  to visit.    Review of Systems     Objective    BP 128/86 (BP Location: Right Arm, Patient Position: Sitting, Cuff Size: Normal)   Pulse 68   Temp 98.2 F (36.8 C) (Oral)   Resp 16   Ht '5\' 3"'$  (1.6 m)   Wt 119 lb 9.6 oz (54.3 kg)   SpO2 99%   BMI 21.19 kg/m    Physical Exam  Gen: well appearing, in NAD Card: RRR Lungs: CTAB Ext: WWP, no edema MSK: no midline spinal tenderness. Full AROM in flexion, extension, sidebending. TTP over L lumbar paravertebral musculature and L piriformis. No tenderness over greater trochanter. 5/5 LE strength. Patellar and achilles DTRs intact and symmetric. No LE edema.    No results found for any visits on 05/08/22.  Assessment & Plan     Low back pain with sciatica Chronic problem with recent worsening. No findings to warrant emergent imaging or referral. Will trial low dose gabapentin. Recommend supportive foot wear. Handout provided on low back stretches. F/u prn.   Return if symptoms worsen or fail to improve.       Myles Gip, [place] [phone] (phone) [phone] (fax)  [place]

## 2022-05-07 NOTE — Telephone Encounter (Signed)
    Summary: back pain   Pt states she has back pain down to her buttocks, and hard to walk at times.  She has had this in the past, and Dr Brita Romp advised her what to do. Pt states this keeps happening. Would like to know if you can look this up. Please advise, she is in a lot of pain.        Chief Complaint: back pain requesting appt today or tomorrow  Symptoms: low left back pain, pain with walking, radiates to buttocks not legs. Taking tylenol with little relief.  Frequency: last week , Thursday  Pertinent Negatives: Patient denies numbness, weakness,.  Disposition: '[]'$ ED /'[x]'$ Urgent Care (no appt availability in office) / '[]'$ Appointment(In office/virtual)/ '[]'$  [facility]/ '[]'$ Home Care/ '[]'$ Refused Recommended Disposition /'[x]'$ [facility]/ '[x]'$  Follow-up with PCP Additional Notes:  Recommended UC VV and patient declined. Patient is going out of town for 81[address] B day and requesting any recommendations such as exercise or pain medication to help. Please advise no available appt noted today or tomorrow. Patient will leave for out of town 05/09/22.   Reason for Disposition  [1] MODERATE back pain (e.g., interferes with normal activities) AND [2] present > 3 days  Answer Assessment - Initial Assessment Questions 1. ONSET: "When did the pain begin?"      Last week Thursday 2. LOCATION: "Where does it hurt?" (upper, mid or lower back)     Low back left side  3. SEVERITY: "How bad is the pain?"  (e.g., Scale 1-10; mild, moderate, or severe)   - MILD (1-3): doesn't interfere with normal activities    - MODERATE (4-7): interferes with normal activities or awakens from sleep    - SEVERE (8-10): excruciating pain, unable to do any normal activities      Moderate to severe at times  4. PATTERN: "Is the pain constant?" (e.g., yes, no; constant, intermittent)      come 5. RADIATION: "Does the pain shoot into your legs or elsewhere?"    Shoot to buttocks only left back  6. CAUSE:   "What do you think is causing the back pain?"      Not sure  7. BACK OVERUSE:  "Any recent lifting of heavy objects, strenuous work or exercise?"     na 8. MEDICATIONS: "What have you taken so far for the pain?" (e.g., nothing, acetaminophen, NSAIDS)     Tylenol with little relief  9. NEUROLOGIC SYMPTOMS: "Do you have any weakness, numbness, or problems with bowel/bladder control?"     Na  10. OTHER SYMPTOMS: "Do you have any other symptoms?" (e.g., fever, abdominal pain, burning with urination, blood in urine)       Back pain  11. PREGNANCY: "Is there any chance you are pregnant?" (e.g., yes, no; LMP)       na  Protocols used: Back Pain-A-AH

## 2022-05-07 NOTE — Telephone Encounter (Signed)
Can see if anyone has availability today or tomorrow for acute visit.  Alternatively, [facility] takes walk-ins in the afternoons and would be able to take care of her for this too.

## 2022-05-08 ENCOUNTER — Encounter: Payer: Self-pay | Admitting: Family Medicine

## 2022-05-08 ENCOUNTER — Ambulatory Visit (INDEPENDENT_AMBULATORY_CARE_PROVIDER_SITE_OTHER): Payer: PPO | Admitting: Family Medicine

## 2022-05-08 ENCOUNTER — Ambulatory Visit: Payer: PPO | Admitting: Physician Assistant

## 2022-05-08 VITALS — BP 128/86 | HR 68 | Temp 98.2°F | Resp 16 | Ht 63.0 in | Wt 119.6 lb

## 2022-05-08 DIAGNOSIS — M5442 Lumbago with sciatica, left side: Secondary | ICD-10-CM

## 2022-05-08 MED ORDER — GABAPENTIN 100 MG PO CAPS: ORAL_CAPSULE | ORAL | 0 refills | Status: DC

## 2022-05-08 NOTE — Patient Instructions (Signed)
It was great to see you!  Our plans for today:  - Take gabapentin as needed for pain. Start taking this once at night to make sure you tolerate it, then can increase to 3 times daily as needed.  - Try the stretches below. - You can continue to take tylenol, ice, heat as needed.  Take care and seek immediate care sooner if you develop any concerns.   Dr. Romilda Garret on your back. Bend your right knee so that your right foot is flat on the floor. Cross your left leg over your right so that your left ankle rests on your right knee. Use your hands to grab hold of your left knee and pull it gently toward the opposite shoulder. You should feel the stretch in your buttocks and hips. Hold for 15 to 30 seconds. Relax, and then repeat with the other leg. Repeat this cycle 2 to 4 times.   Lie on your back in a doorway, with one leg through the open door. Slide your leg up the wall to straighten your knee. You should feel a gentle stretch down the back of your leg. Hold the stretch for at least 1 minute. As the days go by, add a little more time until you can relax and let these muscles stretch for as much as 6 minutes for each leg. Do not arch your back. Do not bend either knee. Keep one heel touching the floor and the other heel touching the wall. Do not point your toes. Repeat with your other leg. Do 2 to 4 times for each leg. If you do not have a place to do this exercise in a doorway, there is another way to do it:  Lie on your back and bend the knee of the leg you want to stretch. Loop a towel under the ball and toes of that foot, and hold the ends of the towel in your hands. Straighten your knee and slowly pull back on the towel. You should feel a gentle stretch down the back of your leg. It is hard to hold this stretch with a towel for a long time, but hold the stretch for at least 15 to 30 seconds. One minute or more is even better. Repeat with your other leg. Do 2 to 4 times for each  leg.   Child's Pose Kneel on the floor with your toes together and your knees hip-width apart. Rest your palms on top of your thighs. On an exhale, lower your torso between your knees. Extend your arms alongside your torso with your palms facing down. Relax your shoulders toward the ground. Rest in the pose for as long as needed.

## 2022-05-21 DIAGNOSIS — Z7901 Long term (current) use of anticoagulants: Secondary | ICD-10-CM | POA: Diagnosis not present

## 2022-05-21 DIAGNOSIS — Z87891 Personal history of nicotine dependence: Secondary | ICD-10-CM | POA: Diagnosis not present

## 2022-05-21 DIAGNOSIS — D6869 Other thrombophilia: Secondary | ICD-10-CM | POA: Diagnosis not present

## 2022-05-21 DIAGNOSIS — I10 Essential (primary) hypertension: Secondary | ICD-10-CM | POA: Diagnosis not present

## 2022-05-21 DIAGNOSIS — I480 Paroxysmal atrial fibrillation: Secondary | ICD-10-CM | POA: Diagnosis not present

## 2022-06-06 DIAGNOSIS — W57XXXA Bitten or stung by nonvenomous insect and other nonvenomous arthropods, initial encounter: Secondary | ICD-10-CM | POA: Diagnosis not present

## 2022-06-06 DIAGNOSIS — S70361A Insect bite (nonvenomous), right thigh, initial encounter: Secondary | ICD-10-CM | POA: Diagnosis not present

## 2022-06-11 ENCOUNTER — Ambulatory Visit: Payer: Self-pay

## 2022-06-12 ENCOUNTER — Ambulatory Visit (INDEPENDENT_AMBULATORY_CARE_PROVIDER_SITE_OTHER): Payer: PPO | Admitting: Family Medicine

## 2022-06-12 ENCOUNTER — Encounter: Payer: Self-pay | Admitting: Family Medicine

## 2022-06-12 DIAGNOSIS — F5102 Adjustment insomnia: Secondary | ICD-10-CM | POA: Diagnosis not present

## 2022-06-12 MED ORDER — ZOLPIDEM TARTRATE 5 MG PO TABS: ORAL_TABLET | ORAL | 1 refills | Status: DC

## 2022-06-12 MED ORDER — HYDROCHLOROTHIAZIDE 25 MG PO TABS: 25.0000 mg | ORAL_TABLET | Freq: Every day | ORAL | 0 refills | Status: DC

## 2022-06-13 ENCOUNTER — Ambulatory Visit (INDEPENDENT_AMBULATORY_CARE_PROVIDER_SITE_OTHER): Payer: PPO | Admitting: Family Medicine

## 2022-06-13 ENCOUNTER — Encounter: Payer: Self-pay | Admitting: Family Medicine

## 2022-06-13 VITALS — BP 130/83 | HR 67 | Temp 97.8°F | Resp 16

## 2022-06-13 DIAGNOSIS — L739 Follicular disorder, unspecified: Secondary | ICD-10-CM

## 2022-06-13 NOTE — Progress Notes (Signed)
    SUBJECTIVE:   CHIEF COMPLAINT / HPI:   LUMP Duration: this morning Location: L groin Painful:  slightly sore to touch Trauma: no Redness:  unsure Bruising: no Recent infection: no Swollen lymph nodes:  unsure History of cancer: no Associated signs and symptoms: none   OBJECTIVE:   BP 130/83 (BP Location: Left Arm, Patient Position: Sitting, Cuff Size: Normal)   Pulse 67   Temp 97.8 F (36.6 C) (Oral)   Resp 16   Gen: well appearing, in NAD Skin: L sided mons pubis with redness and localized swelling at base of hair follicle. No inguinal lymphadenopathy or streaking. No fluctuance, slight induration.    ASSESSMENT/PLAN:   Folliculitis Continues on antibiotics for previous bug bite, recommend continuation, has 3 days left. Otherwise, reassurance provided. No evidence of abscess.   Myles Gip, DO

## 2022-06-18 ENCOUNTER — Ambulatory Visit (INDEPENDENT_AMBULATORY_CARE_PROVIDER_SITE_OTHER): Payer: PPO | Admitting: Family Medicine

## 2022-06-18 ENCOUNTER — Encounter: Payer: Self-pay | Admitting: Family Medicine

## 2022-06-18 VITALS — BP 106/61 | HR 70 | Temp 97.8°F | Resp 14 | Wt 119.0 lb

## 2022-06-18 DIAGNOSIS — R2241 Localized swelling, mass and lump, right lower limb: Secondary | ICD-10-CM

## 2022-06-18 NOTE — Progress Notes (Signed)
I,Roshena L Chambers,acting as a scribe for Lelon Huh, MD.,have documented all relevant documentation on the behalf of Lelon Huh, MD,as directed by  Lelon Huh, MD while in the presence of Lelon Huh, MD.   Established patient visit   Patient: Alexis Lyons   DOB: 1941/11/22   81 y.o. Female  MRN: [id number] Visit Date: 06/18/2022  Today's healthcare provider: Lelon Huh, MD   Chief Complaint  Patient presents with   Foot Problem   Subjective    HPI  Toe problem: Patient complains of malalignment to her toe. She reports, while walking out of the grocery store 2 days ago she felt her toe pop. When she looked at her foot she noticed  the 2nd toe on her right foot seemed to be hyperextended. She was wearing closed toe shoes when this happened. Patient also reports numbness on the bottom of her right foot. Otherwise able to walk and bear weight without difficulty.     Medications: Outpatient Medications Prior to Visit  Medication Sig   apixaban (ELIQUIS) 5 MG TABS tablet Take 1 tablet (5 mg total) by mouth 2 (two) times daily.   gabapentin (NEURONTIN) 100 MG capsule Take one capsule by mouth at bedtime. If tolerated, can increase to 1 capsule by mouth three times daily as needed for pain.   Ginseng 100 MG CAPS Take 100 mg by mouth 4 (four) times a week.   hydrochlorothiazide (HYDRODIURIL) 25 MG tablet Take 1 tablet (25 mg total) by mouth daily.   tretinoin (RETIN-A) 0.05 % cream Apply 1 application topically See admin instructions. Every other night   zolpidem (AMBIEN) 5 MG tablet TAKE 1/2 TABLET BY MOUTH AT BEDTIME AS NEEDED SLEEP   No facility-administered medications prior to visit.    Review of Systems  Constitutional:  Negative for appetite change, chills, fatigue and fever.  Respiratory:  Negative for chest tightness and shortness of breath.   Cardiovascular:  Negative for chest pain and palpitations.  Gastrointestinal:  Negative for abdominal pain,  nausea and vomiting.  Musculoskeletal:  Positive for joint swelling.  Neurological:  Positive for numbness. Negative for dizziness and weakness.       Objective    BP 106/61 (BP Location: Left Arm, Patient Position: Sitting, Cuff Size: Normal)   Pulse 70   Temp 97.8 F (36.6 C) (Oral)   Resp 14   Wt 119 lb (54 kg)   SpO2 98% Comment: room air  BMI 21.08 kg/m    Physical Exam   Slightly swelling dorsal aspect of IP of right second toe. Minimal tenderness. Able to flex and extend toe actively and passively.     Assessment & Plan     1. Localized swelling of toe of right foot Likely mild tendon strain or partial rupture resulting and slight joint deformity and swelling. There is minimal pain and no trouble ambulating. Only concern is that she is going to be on river cruise next week and will be doing a lot of walking. Advised that we can have xray done if it becomes any more sore, impairs walking, or if selling not going down in the next couple of days. Order placed if she decides to have xray done.  - DG Toe 2nd Right; Future      The entirety of the information documented in the History of Present Illness, Review of Systems and Physical Exam were personally obtained by me. Portions of this information were initially documented by the CMA and reviewed  by me for thoroughness and accuracy.     Lelon Huh, MD  [facility] [phone] (phone) [phone] (fax)  [place]

## 2022-07-23 ENCOUNTER — Ambulatory Visit: Payer: PPO | Admitting: Family Medicine

## 2022-07-25 ENCOUNTER — Other Ambulatory Visit: Payer: Self-pay

## 2022-07-25 MED ORDER — APIXABAN 5 MG PO TABS: 5.0000 mg | ORAL_TABLET | Freq: Two times a day (BID) | ORAL | 1 refills | Status: DC

## 2022-07-25 NOTE — Telephone Encounter (Signed)
Prescription refill request for Eliquis received. Indication:Afib Last office visit:12/22 Scr:0.8 Age: 81 Weight:54 kg  Prescription refilled

## 2022-08-14 NOTE — Progress Notes (Unsigned)
Established patient visit   Patient: Alexis Lyons   DOB: 1941/10/28   81 y.o. Female  MRN: [id number] Visit Date: 08/15/2022  Today's healthcare provider: Gwyneth Sprout, FNP  Introduced to nurse practitioner role and practice setting.  All questions answered.  Discussed provider/patient relationship and expectations.   I,Calyb Mcquarrie J Valyncia Wiens,acting as a scribe for Gwyneth Sprout, FNP.,have documented all relevant documentation on the behalf of Gwyneth Sprout, FNP,as directed by  Gwyneth Sprout, FNP while in the presence of Gwyneth Sprout, FNP.   Chief Complaint  Patient presents with   Numbness    Patient complains of R foot numbness starting last year, worsening in the past month.    Subjective    HPI HPI     Numbness    Additional comments: Patient complains of R foot numbness starting last year, worsening in the past month.       Last edited by Smitty Knudsen, CMA on 08/15/2022  1:07 PM.       Medications: Outpatient Medications Prior to Visit  Medication Sig   apixaban (ELIQUIS) 5 MG TABS tablet Take 1 tablet (5 mg total) by mouth 2 (two) times daily.   gabapentin (NEURONTIN) 100 MG capsule Take one capsule by mouth at bedtime. If tolerated, can increase to 1 capsule by mouth three times daily as needed for pain.   Ginseng 100 MG CAPS Take 100 mg by mouth 4 (four) times a week.   hydrochlorothiazide (HYDRODIURIL) 25 MG tablet Take 1 tablet (25 mg total) by mouth daily.   tretinoin (RETIN-A) 0.05 % cream Apply 1 application topically See admin instructions. Every other night   zolpidem (AMBIEN) 5 MG tablet TAKE 1/2 TABLET BY MOUTH AT BEDTIME AS NEEDED SLEEP   No facility-administered medications prior to visit.    Review of Systems    Objective    BP (!) 146/89 (BP Location: Right Arm, Patient Position: Sitting, Cuff Size: Normal)   Pulse 61   Temp 98 F (36.7 C) (Oral)   Resp 16   Ht '5\' 3"'$  (1.6 m)   Wt 117 lb (53.1 kg)   SpO2 100%   BMI 20.73 kg/m    Physical Exam Vitals and nursing note reviewed.  Constitutional:      General: She is not in acute distress.    Appearance: Normal appearance. She is normal weight. She is not ill-appearing, toxic-appearing or diaphoretic.  HENT:     Head: Normocephalic and atraumatic.  Cardiovascular:     Rate and Rhythm: Normal rate and regular rhythm.     Pulses: Normal pulses.          Dorsalis pedis pulses are 2+ on the right side.       Posterior tibial pulses are 2+ on the right side and 2+ on the left side.     Heart sounds: Normal heart sounds. No murmur heard.    No friction rub. No gallop.  Pulmonary:     Effort: Pulmonary effort is normal. No respiratory distress.     Breath sounds: Normal breath sounds. No stridor. No wheezing, rhonchi or rales.  Chest:     Chest wall: No tenderness.  Musculoskeletal:        General: No swelling, tenderness, deformity or signs of injury. Normal range of motion.     Right lower leg: No edema.     Left lower leg: No edema.     Right foot: Bunion present.  Left foot: Bunion present.  Feet:     Right foot:     Skin integrity: Skin integrity normal.     Toenail Condition: Right toenails are normal.     Left foot:     Skin integrity: Skin integrity normal.     Toenail Condition: Left toenails are normal.  Skin:    General: Skin is warm and dry.     Capillary Refill: Capillary refill takes less than 2 seconds.     Coloration: Skin is not jaundiced or pale.     Findings: No bruising, erythema, lesion or rash.  Neurological:     General: No focal deficit present.     Mental Status: She is alert and oriented to person, place, and time. Mental status is at baseline.     Cranial Nerves: No cranial nerve deficit.     Sensory: No sensory deficit.     Motor: No weakness.     Coordination: Coordination normal.  Psychiatric:        Mood and Affect: Mood normal.        Behavior: Behavior normal.        Thought Content: Thought content normal.         Judgment: Judgment normal.     No results found for any visits on 08/15/22.  Assessment & Plan     Problem List Items Addressed This Visit       Cardiovascular and Mediastinum   Essential hypertension    Chronic, slightly elevated today Goal <140/<90        Nervous and Auditory   Neuropathy    Chronic, stable Reports she is still able to walk 2x per day Previously seen by Manuella Ghazi and did not want imaging or start of trial of gabapentin at that point Leg is not swollen; normal pulses and appearance  Recommend referral to podiatry for tendon release      Relevant Orders   Vitamin B6   B12 and Folate Panel   CBC with Differential/Platelet   Comprehensive Metabolic Panel (CMET)   Hemoglobin A1c     Musculoskeletal and Integument   Toe tendonitis - Primary    Acute on chronic xfew months Recommend follow up with podiatry  Also has bunion on foot      Relevant Orders   Ambulatory referral to Podiatry     Other   Elevated LDL cholesterol level    Chronic, previously elevated Recommend LDL <100 Repeat LP recommend diet low in saturated fat and regular exercise - 30 min at least 5 times per week       Relevant Orders   Lipid panel     Return in about 6 weeks (around 09/26/2022) for HTN management, chonic disease management.      Vonna Kotyk, FNP, have reviewed all documentation for this visit. The documentation on 08/15/22 for the exam, diagnosis, procedures, and orders are all accurate and complete.    Gwyneth Sprout, [place] [phone] (phone) [phone] (fax)  [place]

## 2022-08-15 ENCOUNTER — Other Ambulatory Visit: Payer: Self-pay

## 2022-08-15 ENCOUNTER — Ambulatory Visit (INDEPENDENT_AMBULATORY_CARE_PROVIDER_SITE_OTHER): Payer: PPO | Admitting: Family Medicine

## 2022-08-15 ENCOUNTER — Emergency Department: Payer: PPO

## 2022-08-15 ENCOUNTER — Observation Stay
Admission: EM | Admit: 2022-08-15 | Discharge: 2022-08-16 | Disposition: A | Discharge status: Home / Self Care | Payer: PPO | Attending: Internal Medicine | Admitting: Internal Medicine

## 2022-08-15 ENCOUNTER — Encounter: Payer: Self-pay | Admitting: Family Medicine

## 2022-08-15 VITALS — BP 146/89 | HR 61 | Temp 98.0°F | Resp 16 | Ht 63.0 in | Wt 117.0 lb

## 2022-08-15 DIAGNOSIS — I110 Hypertensive heart disease with heart failure: Secondary | ICD-10-CM | POA: Insufficient documentation

## 2022-08-15 DIAGNOSIS — I5030 Unspecified diastolic (congestive) heart failure: Secondary | ICD-10-CM | POA: Insufficient documentation

## 2022-08-15 DIAGNOSIS — I071 Rheumatic tricuspid insufficiency: Secondary | ICD-10-CM | POA: Diagnosis not present

## 2022-08-15 DIAGNOSIS — Z87891 Personal history of nicotine dependence: Secondary | ICD-10-CM | POA: Diagnosis not present

## 2022-08-15 DIAGNOSIS — E7800 Pure hypercholesterolemia, unspecified: Secondary | ICD-10-CM | POA: Insufficient documentation

## 2022-08-15 DIAGNOSIS — I10 Essential (primary) hypertension: Secondary | ICD-10-CM | POA: Diagnosis not present

## 2022-08-15 DIAGNOSIS — E876 Hypokalemia: Secondary | ICD-10-CM | POA: Diagnosis not present

## 2022-08-15 DIAGNOSIS — Z79899 Other long term (current) drug therapy: Secondary | ICD-10-CM | POA: Insufficient documentation

## 2022-08-15 DIAGNOSIS — R531 Weakness: Secondary | ICD-10-CM | POA: Diagnosis not present

## 2022-08-15 DIAGNOSIS — W19XXXA Unspecified fall, initial encounter: Secondary | ICD-10-CM | POA: Diagnosis not present

## 2022-08-15 DIAGNOSIS — R9431 Abnormal electrocardiogram [ECG] [EKG]: Secondary | ICD-10-CM | POA: Insufficient documentation

## 2022-08-15 DIAGNOSIS — K219 Gastro-esophageal reflux disease without esophagitis: Secondary | ICD-10-CM | POA: Insufficient documentation

## 2022-08-15 DIAGNOSIS — R55 Syncope and collapse: Secondary | ICD-10-CM | POA: Diagnosis not present

## 2022-08-15 DIAGNOSIS — I480 Paroxysmal atrial fibrillation: Secondary | ICD-10-CM | POA: Diagnosis not present

## 2022-08-15 DIAGNOSIS — G629 Polyneuropathy, unspecified: Secondary | ICD-10-CM | POA: Diagnosis not present

## 2022-08-15 DIAGNOSIS — Z7901 Long term (current) use of anticoagulants: Secondary | ICD-10-CM | POA: Diagnosis not present

## 2022-08-15 DIAGNOSIS — I959 Hypotension, unspecified: Secondary | ICD-10-CM

## 2022-08-15 DIAGNOSIS — I952 Hypotension due to drugs: Secondary | ICD-10-CM | POA: Diagnosis not present

## 2022-08-15 DIAGNOSIS — M778 Other enthesopathies, not elsewhere classified: Secondary | ICD-10-CM | POA: Diagnosis not present

## 2022-08-15 DIAGNOSIS — I517 Cardiomegaly: Secondary | ICD-10-CM | POA: Diagnosis not present

## 2022-08-15 DIAGNOSIS — R7303 Prediabetes: Secondary | ICD-10-CM | POA: Diagnosis not present

## 2022-08-15 DIAGNOSIS — Z20822 Contact with and (suspected) exposure to covid-19: Secondary | ICD-10-CM | POA: Diagnosis not present

## 2022-08-15 DIAGNOSIS — T1490XA Injury, unspecified, initial encounter: Secondary | ICD-10-CM | POA: Diagnosis not present

## 2022-08-15 DIAGNOSIS — R404 Transient alteration of awareness: Secondary | ICD-10-CM | POA: Diagnosis not present

## 2022-08-15 LAB — URINE DRUG SCREEN, QUALITATIVE (ARMC ONLY)
Amphetamines, Ur Screen: NOT DETECTED
Barbiturates, Ur Screen: NOT DETECTED
Benzodiazepine, Ur Scrn: NOT DETECTED
Cannabinoid 50 Ng, Ur ~~LOC~~: NOT DETECTED
Cocaine Metabolite,Ur ~~LOC~~: NOT DETECTED
MDMA (Ecstasy)Ur Screen: NOT DETECTED
Methadone Scn, Ur: NOT DETECTED
Opiate, Ur Screen: NOT DETECTED
Phencyclidine (PCP) Ur S: NOT DETECTED
Tricyclic, Ur Screen: NOT DETECTED

## 2022-08-15 LAB — URINALYSIS, ROUTINE W REFLEX MICROSCOPIC
Bacteria, UA: NONE SEEN
Bilirubin Urine: NEGATIVE
Glucose, UA: NEGATIVE mg/dL
Hgb urine dipstick: NEGATIVE
Ketones, ur: NEGATIVE mg/dL
Nitrite: NEGATIVE
Protein, ur: NEGATIVE mg/dL
Specific Gravity, Urine: 1.01 (ref 1.005–1.030)
Squamous Epithelial / HPF: NONE SEEN (ref 0–5)
WBC, UA: NONE SEEN WBC/hpf (ref 0–5)
pH: 6 (ref 5.0–8.0)

## 2022-08-15 LAB — CBC WITH DIFFERENTIAL/PLATELET
Abs Immature Granulocytes: 0.02 10*3/uL (ref 0.00–0.07)
Basophils Absolute: 0.1 10*3/uL (ref 0.0–0.1)
Basophils Relative: 1 %
Eosinophils Absolute: 0.1 10*3/uL (ref 0.0–0.5)
Eosinophils Relative: 2 %
HCT: 38.5 % (ref 36.0–46.0)
Hemoglobin: 12.8 g/dL (ref 12.0–15.0)
Immature Granulocytes: 0 %
Lymphocytes Relative: 36 %
Lymphs Abs: 1.9 10*3/uL (ref 0.7–4.0)
MCH: 30.6 pg (ref 26.0–34.0)
MCHC: 33.2 g/dL (ref 30.0–36.0)
MCV: 92.1 fL (ref 80.0–100.0)
Monocytes Absolute: 0.6 10*3/uL (ref 0.1–1.0)
Monocytes Relative: 10 %
Neutro Abs: 2.8 10*3/uL (ref 1.7–7.7)
Neutrophils Relative %: 51 %
Platelets: 177 10*3/uL (ref 150–400)
RBC: 4.18 MIL/uL (ref 3.87–5.11)
RDW: 12.1 % (ref 11.5–15.5)
WBC: 5.5 10*3/uL (ref 4.0–10.5)
nRBC: 0 % (ref 0.0–0.2)

## 2022-08-15 LAB — HEPATIC FUNCTION PANEL
ALT: 17 U/L (ref 0–44)
AST: 33 U/L (ref 15–41)
Albumin: 2.7 g/dL — ABNORMAL LOW (ref 3.5–5.0)
Alkaline Phosphatase: 35 U/L — ABNORMAL LOW (ref 38–126)
Bilirubin, Direct: 0.3 mg/dL — ABNORMAL HIGH (ref 0.0–0.2)
Indirect Bilirubin: 0.5 mg/dL (ref 0.3–0.9)
Total Bilirubin: 0.8 mg/dL (ref 0.3–1.2)
Total Protein: 4.3 g/dL — ABNORMAL LOW (ref 6.5–8.1)

## 2022-08-15 LAB — BASIC METABOLIC PANEL
Anion gap: 7 (ref 5–15)
BUN: 12 mg/dL (ref 8–23)
CO2: 17 mmol/L — ABNORMAL LOW (ref 22–32)
Calcium: 6.5 mg/dL — ABNORMAL LOW (ref 8.9–10.3)
Chloride: 119 mmol/L — ABNORMAL HIGH (ref 98–111)
Creatinine, Ser: 0.51 mg/dL (ref 0.44–1.00)
GFR, Estimated: >60 mL/min (ref >60)
Glucose, Bld: 77 mg/dL (ref 70–99)
Potassium: 2.8 mmol/L — ABNORMAL LOW (ref 3.5–5.1)
Sodium: 143 mmol/L (ref 135–145)

## 2022-08-15 LAB — TROPONIN I (HIGH SENSITIVITY): Troponin I (High Sensitivity): 2 ng/L (ref <18)

## 2022-08-15 LAB — ETHANOL: Alcohol, Ethyl (B): 24 mg/dL — ABNORMAL HIGH (ref <10)

## 2022-08-15 MED ORDER — THIAMINE HCL 100 MG/ML IJ SOLN
100.0000 mg | Freq: Once | INTRAMUSCULAR | Status: AC
  Administered 2022-08-16: 100 mg via INTRAVENOUS
  Filled 2022-08-15: qty 2

## 2022-08-15 NOTE — ED Notes (Signed)
Pt in CT.

## 2022-08-15 NOTE — ED Triage Notes (Signed)
Pt had 2 cosmo's tonight and had mutliple episodes of falling tonight. Pt was very hypotensive for EMS. IV established and hx of a-fib and is on eliquis. Pt is aaox4

## 2022-08-15 NOTE — Assessment & Plan Note (Signed)
Chronic, stable Reports she is still able to walk 2x per day Previously seen by Manuella Ghazi and did not want imaging or start of trial of gabapentin at that point Leg is not swollen; normal pulses and appearance  Recommend referral to podiatry for tendon release

## 2022-08-15 NOTE — H&P (Incomplete)
History and Physical    Alexis Lyons WYO:[id number] DOB: [birth year] DOA: 08/15/2022  PCP: Virginia Crews, MD    Patient coming from:  Home    Chief Complaint: Syncope   HPI:  Alexis Lyons is a 81 y.o. female seen in ed with complaints of syncope x 2 , after going to bed today. 81 y/o didn't feel good after her usual drink and had a syncopal episode x 2. Called EMS and BP was 45 systolic and gave her IVF.  Pt is hypotensive and syncopal. Low potassium. Co2 17. Albumin 6.7 Head ct is ok. H/H is ok.  Chest xray increased interstitial edema. COVID BNP TNI , magnesium -pending. Prolonged QTC 567.  04/20/2021 2 D Echocardiogram:  1. Left ventricular ejection fraction, by estimation, is 60 to 65%. The  left ventricle has normal function. The left ventricle has no regional  wall motion abnormalities. Left ventricular diastolic parameters are  consistent with Grade I diastolic  dysfunction (impaired relaxation). The average left ventricular global  longitudinal strain is -16.5 %. The global longitudinal strain is normal.   2. Right ventricular systolic function is normal. The right ventricular  size is normal. There is normal pulmonary artery systolic pressure. The  estimated right ventricular systolic pressure is 77.4 mmHg.   3. Tricuspid valve regurgitation is mild to moderate.   4. There is borderline dilatation of the aortic root, measuring 38 mm.     ED Course:   Vitals:   08/15/22 2159 08/15/22 2201 08/15/22 2225 08/15/22 2340  BP: 119/63  117/65 134/73  Pulse: 83  69 88  Resp: '18  17 19  '$ Temp:  97.6 F (36.4 C)    TempSrc:  Oral    SpO2: 96%  92% 97%   ***  Review of Systems:  ROS    Past Medical History:  Diagnosis Date  . Amnesia   . Aphthae   . Atrial fibrillation ([place])   . Congestion-fibrosis syndrome   . DD (diverticular disease)   . GERD (gastroesophageal reflux disease)   . Seizures ([place]) 12/2017   passed out due to dehydration     Past Surgical History:  Procedure Laterality Date  . ATRIAL FIBRILLATION ABLATION N/A 11/02/2021   Procedure: ATRIAL FIBRILLATION ABLATION;  Surgeon: Vickie Epley, MD;  Location: [place] [facility];  Service: Cardiovascular;  Laterality: N/A;  . CHOLECYSTECTOMY    . colonoscopy with polypectomy     8/11 4 mm polyp, 12 mm polyp  . COLONOSCOPY WITH PROPOFOL N/A 02/24/2018   Procedure: COLONOSCOPY WITH PROPOFOL;  Surgeon: Manya Silvas, MD;  Location: [facility] END[facility];  Service: Endoscopy;  Laterality: N/A;  . HERNIA REPAIR Right 2004   inguinal   . HERNIA REPAIR     x's 3     reports that she quit smoking about 36 years ago. Her smoking use included cigarettes. She has a 7.50 pack-year smoking history. She has never used smokeless tobacco. She reports current alcohol use of about 7.0 standard drinks of alcohol per week. She reports that she does not use drugs.  Allergies  Allergen Reactions  . Codeine Other (See Comments)    Messed up head really bad    Family History  Problem Relation Age of Onset  . Alzheimer's disease Mother   . Alcohol abuse Father   . Cancer Father        liver cancer  . Cancer Sister        breast  . Breast  cancer Sister     Prior to Admission medications   Medication Sig Start Date End Date Taking? Authorizing Provider  apixaban (ELIQUIS) 5 MG TABS tablet Take 1 tablet (5 mg total) by mouth 2 (two) times daily. 07/25/22   Kate Sable, MD  gabapentin (NEURONTIN) 100 MG capsule Take one capsule by mouth at bedtime. If tolerated, can increase to 1 capsule by mouth three times daily as needed for pain. 05/08/22   Myles Gip, DO  Ginseng 100 MG CAPS Take 100 mg by mouth 4 (four) times a week. 06/08/09   [provider]  hydrochlorothiazide (HYDRODIURIL) 25 MG tablet Take 1 tablet (25 mg total) by mouth daily. 06/12/22   Myles Gip, DO  tretinoin (RETIN-A) 0.05 % cream Apply 1 application topically See admin instructions.  Every other night    [provider]  zolpidem (AMBIEN) 5 MG tablet TAKE 1/2 TABLET BY MOUTH AT BEDTIME AS NEEDED SLEEP 06/12/22   Myles Gip, DO    Physical Exam: Vitals:   08/15/22 2159 08/15/22 2201 08/15/22 2225 08/15/22 2340  BP: 119/63  117/65 134/73  Pulse: 83  69 88  Resp: '18  17 19  '$ Temp:  97.6 F (36.4 C)    TempSrc:  Oral    SpO2: 96%  92% 97%   Physical Exam   Labs on Admission: I have personally reviewed following labs and imaging studies BMET Recent Labs  Lab 08/15/22 2204  NA 143  K 2.8*  CL 119*  CO2 17*  BUN 12  CREATININE 0.51  GLUCOSE 77   Electrolytes Recent Labs  Lab 08/15/22 2204  CALCIUM 6.5*   Sepsis Markers No results for input(s): "LATICACIDVEN", "PROCALCITON", "O2SATVEN" in the last 168 hours. ABG No results for input(s): "PHART", "PCO2ART", "PO2ART" in the last 168 hours. Liver Enzymes Recent Labs  Lab 08/15/22 2204  AST 33  ALT 17  ALKPHOS 35*  BILITOT 0.8  ALBUMIN 2.7*   Cardiac Enzymes No results for input(s): "TROPONINI", "PROBNP" in the last 168 hours. No results found for: "DDIMER" Coag's No results for input(s): "APTT", "INR" in the last 168 hours.  No results found for this or any previous visit (from the past 240 hour(s)).   Current Facility-Administered Medications:  .  [START ON 08/16/2022] thiamine (VITAMIN B1) injection 100 mg, 100 mg, Intravenous, Once, Nena Polio, MD  Current Outpatient Medications:  .  apixaban (ELIQUIS) 5 MG TABS tablet, Take 1 tablet (5 mg total) by mouth 2 (two) times daily., Disp: 180 tablet, Rfl: 1 .  gabapentin (NEURONTIN) 100 MG capsule, Take one capsule by mouth at bedtime. If tolerated, can increase to 1 capsule by mouth three times daily as needed for pain., Disp: 30 capsule, Rfl: 0 .  Ginseng 100 MG CAPS, Take 100 mg by mouth 4 (four) times a week., Disp: , Rfl:  .  hydrochlorothiazide (HYDRODIURIL) 25 MG tablet, Take 1 tablet (25 mg total) by mouth daily.,  Disp: 90 tablet, Rfl: 0 .  tretinoin (RETIN-A) 0.05 % cream, Apply 1 application topically See admin instructions. Every other night, Disp: , Rfl:  .  zolpidem (AMBIEN) 5 MG tablet, TAKE 1/2 TABLET BY MOUTH AT BEDTIME AS NEEDED SLEEP, Disp: 15 tablet, Rfl: 1  COVID-19 Labs No results for input(s): "DDIMER", "FERRITIN", "LDH", "CRP" in the last 72 hours. Lab Results  Component Value Date   [place] Not Detected 09/16/2019    Radiological Exams on Admission: CT Head Wo Contrast  Result Date:  08/15/2022 CLINICAL DATA:  Trauma fall hypotensive EXAM: CT HEAD WITHOUT CONTRAST CT CERVICAL SPINE WITHOUT CONTRAST TECHNIQUE: Multidetector CT imaging of the head and cervical spine was performed following the standard protocol without intravenous contrast. Multiplanar CT image reconstructions of the cervical spine were also generated. RADIATION DOSE REDUCTION: This exam was performed according to the departmental dose-optimization program which includes automated exposure control, adjustment of the mA and/or kV according to patient size and/or use of iterative reconstruction technique. COMPARISON:  None Available. FINDINGS: CT HEAD FINDINGS Brain: No acute territorial infarction, hemorrhage or intracranial mass. The ventricles are nonenlarged. Vascular: No hyperdense vessels.  No unexpected calcification Skull: Normal. Negative for fracture or focal lesion. Sinuses/Orbits: No acute finding. Other: None CT CERVICAL SPINE FINDINGS Alignment: Straightening of the cervical spine. No subluxation. Facet alignment within normal limits Skull base and vertebrae: No acute fracture. No primary bone lesion or focal pathologic process. Soft tissues and spinal canal: No prevertebral fluid or swelling. No visible canal hematoma. Disc levels: Mild disc space narrowing and degenerative change C5-C6 and C6-C7. Upper chest: Negative.  Mild apical scarring 2 Other: None IMPRESSION: 1. Negative non contrasted CT appearance of the  brain for age 34. Straightening of the cervical spine. No acute osseous abnormality Electronically Signed   By: Donavan Foil M.D.   On: 08/15/2022 23:33   CT Cervical Spine Wo Contrast  Result Date: 08/15/2022 CLINICAL DATA:  Trauma fall hypotensive EXAM: CT HEAD WITHOUT CONTRAST CT CERVICAL SPINE WITHOUT CONTRAST TECHNIQUE: Multidetector CT imaging of the head and cervical spine was performed following the standard protocol without intravenous contrast. Multiplanar CT image reconstructions of the cervical spine were also generated. RADIATION DOSE REDUCTION: This exam was performed according to the departmental dose-optimization program which includes automated exposure control, adjustment of the mA and/or kV according to patient size and/or use of iterative reconstruction technique. COMPARISON:  None Available. FINDINGS: CT HEAD FINDINGS Brain: No acute territorial infarction, hemorrhage or intracranial mass. The ventricles are nonenlarged. Vascular: No hyperdense vessels.  No unexpected calcification Skull: Normal. Negative for fracture or focal lesion. Sinuses/Orbits: No acute finding. Other: None CT CERVICAL SPINE FINDINGS Alignment: Straightening of the cervical spine. No subluxation. Facet alignment within normal limits Skull base and vertebrae: No acute fracture. No primary bone lesion or focal pathologic process. Soft tissues and spinal canal: No prevertebral fluid or swelling. No visible canal hematoma. Disc levels: Mild disc space narrowing and degenerative change C5-C6 and C6-C7. Upper chest: Negative.  Mild apical scarring 2 Other: None IMPRESSION: 1. Negative non contrasted CT appearance of the brain for age 34. Straightening of the cervical spine. No acute osseous abnormality Electronically Signed   By: Donavan Foil M.D.   On: 08/15/2022 23:33   DG Chest Portable 1 View  Result Date: 08/15/2022 CLINICAL DATA:  Repeated syncope EXAM: PORTABLE CHEST 1 VIEW COMPARISON:  08/18/2021 FINDINGS: Mild  cardiomegaly. Diffuse increased interstitial opacity. No pleural effusion, consolidation or pneumothorax. IMPRESSION: Diffusely increased interstitial opacity compared to prior either due to interstitial inflammatory process or mild interstitial edema Electronically Signed   By: Donavan Foil M.D.   On: 08/15/2022 22:47    EKG: Independently reviewed.  ***   Assessment and Plan: No notes have been filed under this hospital service. Service: Hospitalist      ***     DVT prophylaxis:  ***   Code Status:  ***   Family Communication:  ***   Disposition Plan:  ***   Consults called:  ***  Admission status: ***       Para Skeans MD [facility]  6 PM- 2 AM. Please contact me via secure Chat 6 PM-2 AM. [phone] ( Pager ) To contact the [facility] Attending or Consulting provider [place] or covering provider during after hours [place], for this patient.   Check the care team in [facility] and look for a) attending/consulting [facility] provider listed and b) the [facility] team listed Log into [email or web address] and use [place]'s universal password to access. If you do not have the password, please contact the hospital operator. Locate the [facility] provider you are looking for under [facility] and page to a number that you can be directly reached. If you still have difficulty reaching the provider, please page the [facility] (Director on Call) for the Hospitalists listed on amion for assistance. [email or web address] 08/15/2022, 11:53 PM

## 2022-08-15 NOTE — Assessment & Plan Note (Signed)
Chronic, previously elevated Recommend LDL <100 Repeat LP recommend diet low in saturated fat and regular exercise - 30 min at least 5 times per week

## 2022-08-15 NOTE — ED Notes (Signed)
Lab sts they will add on labs.

## 2022-08-15 NOTE — ED Provider Notes (Signed)
[facility] Provider Note    Event Date/Time   First MD Initiated Contact with Patient 08/15/22 2217     (approximate)   History   Hypotension (Initial BP 45/23 and 54/26)   HPI { Alexis Lyons is a 81 y.o. female patient had to Cosmo drinks today and then went home sitting down and got up to go to the bathroom and passed out.  She got up again to go to the bedroom and passed out again.  She is never had to cause most before but she has had 1.  She just feels really weak and achy right now.  Nothing in particular is hurting her aching.  She does not think she hit her head.      Physical Exam   Triage Vital Signs: ED Triage Vitals  Enc Vitals Group     BP 08/15/22 2159 119/63     Pulse Rate 08/15/22 2159 83     Resp 08/15/22 2159 18     Temp 08/15/22 2201 97.6 F (36.4 C)     Temp Source 08/15/22 2201 Oral     SpO2 08/15/22 2159 96 %     Weight --      Height --      Head Circumference --      Peak Flow --      Pain Score 08/15/22 2159 0     Pain Loc --      Pain Edu? --      Excl. in [place]? --     Most recent vital signs: Vitals:   08/15/22 2159 08/15/22 2201  BP: 119/63   Pulse: 83   Resp: 18   Temp:  97.6 F (36.4 C)  SpO2: 96%      General: Awake, no distress.  Head normocephalic atraumatic Neck i has no apparent tenderness CV:  Good peripheral perfusion.  Regular rate and rhythm no audible murmurs Resp:  Normal effort.  Lungs are clear Abd:  No distention.  Soft and nontender Extremities no edema   ED Results / Procedures / Treatments   Labs (all labs ordered are listed, but only abnormal results are displayed) Labs Reviewed  BASIC METABOLIC PANEL - Abnormal; Notable for the following components:      Result Value   Potassium 2.8 (*)    Chloride 119 (*)    CO2 17 (*)    Calcium 6.5 (*)    All other components within normal limits  CBC WITH DIFFERENTIAL/PLATELET  HEPATIC FUNCTION PANEL  ETHANOL  PREGNANCY,  URINE  URINALYSIS, ROUTINE W REFLEX MICROSCOPIC  URINE DRUG SCREEN, QUALITATIVE ([facility] ONLY)  TROPONIN I (HIGH SENSITIVITY)     EKG  EKG read interpreted by me shows normal sinus rhythm rate of 80 normal axis nonspecific ST-T wave changes QTc is 586 ms.   RADIOLOGY   PROCEDURES:  Critical Care performed: {CriticalCareYesNo:19197::"Yes, see critical care procedure note(s)","No"}  Procedures   MEDICATIONS ORDERED IN ED: Medications - No data to display   IMPRESSION / MDM / [place] / ED COURSE  I reviewed the triage vital signs and the nursing notes.                              Differential diagnosis includes, but is not limited to, ***  Patient's presentation is most consistent with {EM COPA:27473}  {If the patient is on the monitor, remove the brackets and asterisks on  the sentence below and remember to document it as a Procedure as well. Otherwise delete the sentence below:1} {**The patient is on the cardiac monitor to evaluate for evidence of arrhythmia and/or significant heart rate changes.**} {Remember to include, when applicable, any/all of the following data: independent review of imaging independent review of labs (comment specifically on pertinent positives and negatives) review of specific prior hospitalizations, PCP/specialist notes, etc. discuss meds given and prescribed document any discussion with consultants (including hospitalists) any clinical decision tools you used and why (PECARN, NEXUS, etc.) did you consider admitting the patient? document social determinants of health affecting patient's care (homelessness, inability to follow up in a timely fashion, etc) document any pre-existing conditions increasing risk on current visit (e.g. diabetes and HTN increasing danger of high-risk chest pain/ACS) describes what meds you gave (especially parenteral) and why any other interventions?:1}     FINAL CLINICAL IMPRESSION(S) / ED DIAGNOSES    Final diagnoses:  None     Rx / DC Orders   ED Discharge Orders     None        Note:  This document was prepared using Dragon voice recognition software and may include unintentional dictation errors.

## 2022-08-15 NOTE — H&P (Addendum)
History and Physical    Alexis Lyons:[id number] DOB: 1941/12/02 DOA: 08/15/2022  PCP: Virginia Crews, MD    Patient coming from:  Home    Chief Complaint: Syncope   HPI:  Alexis Lyons is a 81 y.o. female seen in ed with complaints of syncope x 2 , after going to bed today. 81 y/o didn't feel good after her usual drink and had a syncopal episode x 2. Called EMS and BP was 45 systolic and gave her IVF.  Pt is hypotensive and syncopal. Low potassium. Co2 17. Albumin 6.7 Head ct is ok. H/H is ok.  Chest xray increased interstitial edema. COVID BNP TNI , magnesium -pending. Prolonged QTC 567.  04/20/2021 2 D Echocardiogram:  1. Left ventricular ejection fraction, by estimation, is 60 to 65%. The  left ventricle has normal function. The left ventricle has no regional  wall motion abnormalities. Left ventricular diastolic parameters are  consistent with Grade I diastolic  dysfunction (impaired relaxation). The average left ventricular global  longitudinal strain is -16.5 %. The global longitudinal strain is normal.   2. Right ventricular systolic function is normal. The right ventricular  size is normal. There is normal pulmonary artery systolic pressure. The  estimated right ventricular systolic pressure is 82.5 mmHg.   3. Tricuspid valve regurgitation is mild to moderate.   4. There is borderline dilatation of the aortic root, measuring 38 mm.   ED Course:   Vitals:   08/16/22 0030 08/16/22 0100 08/16/22 0200 08/16/22 0235  BP: 124/87 128/70 (!) 143/99   Pulse: 64 72 (!) 138 60  Resp: 19 18 (!) 21 14  Temp:    98.4 F (36.9 C)  TempSrc:    Oral  SpO2: 94% 93% 94% 99%  In ed pt is alert and oriented, afebrile.  Labs shows hypokalemia at 2.8 hypocalcemia at 6.5 serum bicarbonate 17 alk phos 35, normal CBC. Prolonged QT of 567 on EKG.   Review of Systems:  Review of Systems  Eyes:  Positive for blurred vision.  Gastrointestinal:  Positive for nausea.   Neurological:  Positive for dizziness and loss of consciousness.  All other systems reviewed and are negative.  Past Medical History:  Diagnosis Date   Amnesia    Aphthae    Atrial fibrillation ([place])    Congestion-fibrosis syndrome    DD (diverticular disease)    GERD (gastroesophageal reflux disease)    Seizures ([place]) 12/2017   passed out due to dehydration    Past Surgical History:  Procedure Laterality Date   ATRIAL FIBRILLATION ABLATION N/A 11/02/2021   Procedure: [place];  Surgeon: Vickie Epley, MD;  Location: [place] [facility];  Service: Cardiovascular;  Laterality: N/A;   CHOLECYSTECTOMY     colonoscopy with polypectomy     8/11 4 mm polyp, 12 mm polyp   COLONOSCOPY WITH PROPOFOL N/A 02/24/2018   Procedure: COLONOSCOPY WITH PROPOFOL;  Surgeon: Manya Silvas, MD;  Location: [facility];  Service: Endoscopy;  Laterality: N/A;   HERNIA REPAIR Right 2004   inguinal    HERNIA REPAIR     x's 3     reports that she quit smoking about 36 years ago. Her smoking use included cigarettes. She has a 7.50 pack-year smoking history. She has never used smokeless tobacco. She reports current alcohol use of about 7.0 standard drinks of alcohol per week. She reports that she does not use drugs.  Allergies  Allergen Reactions   Codeine  Other (See Comments)    Messed up head really bad    Family History  Problem Relation Age of Onset   Alzheimer's disease Mother    Alcohol abuse Father    Cancer Father        liver cancer   Cancer Sister        breast   Breast cancer Sister     Prior to Admission medications   Medication Sig Start Date End Date Taking? Authorizing Provider  apixaban (ELIQUIS) 5 MG TABS tablet Take 1 tablet (5 mg total) by mouth 2 (two) times daily. 07/25/22   Kate Sable, MD  gabapentin (NEURONTIN) 100 MG capsule Take one capsule by mouth at bedtime. If tolerated, can increase to 1 capsule by mouth three times daily as  needed for pain. 05/08/22   Myles Gip, DO  Ginseng 100 MG CAPS Take 100 mg by mouth 4 (four) times a week. 06/08/09   [provider]  hydrochlorothiazide (HYDRODIURIL) 25 MG tablet Take 1 tablet (25 mg total) by mouth daily. 06/12/22   Myles Gip, DO  tretinoin (RETIN-A) 0.05 % cream Apply 1 application topically See admin instructions. Every other night    [provider]  zolpidem (AMBIEN) 5 MG tablet TAKE 1/2 TABLET BY MOUTH AT BEDTIME AS NEEDED SLEEP 06/12/22   Myles Gip, DO    Physical Exam: Vitals:   08/16/22 0030 08/16/22 0100 08/16/22 0200 08/16/22 0235  BP: 124/87 128/70 (!) 143/99   Pulse: 64 72 (!) 138 60  Resp: 19 18 (!) 21 14  Temp:    98.4 F (36.9 C)  TempSrc:    Oral  SpO2: 94% 93% 94% 99%   Physical Exam Vitals and nursing note reviewed.  Constitutional:      General: She is not in acute distress.    Appearance: Normal appearance. She is not ill-appearing, toxic-appearing or diaphoretic.  HENT:     Head: Normocephalic and atraumatic.     Right Ear: Hearing and external ear normal.     Left Ear: Hearing and external ear normal.     Nose: Nose normal. No nasal deformity.     Mouth/Throat:     Lips: Pink.     Mouth: Mucous membranes are dry.     Tongue: No lesions.  Eyes:     Extraocular Movements: Extraocular movements intact.     Pupils: Pupils are equal, round, and reactive to light.  Cardiovascular:     Rate and Rhythm: Normal rate and regular rhythm.     Pulses: Normal pulses.     Heart sounds: Normal heart sounds.  Pulmonary:     Effort: Pulmonary effort is normal.     Breath sounds: Normal breath sounds.  Abdominal:     General: Bowel sounds are normal. There is no distension.     Palpations: Abdomen is soft. There is no mass.     Tenderness: There is no abdominal tenderness. There is no guarding.     Hernia: No hernia is present.  Musculoskeletal:     Right lower leg: No edema.     Left lower leg: No edema.   Skin:    General: Skin is warm.  Neurological:     General: No focal deficit present.     Mental Status: She is alert and oriented to person, place, and time.     Cranial Nerves: Cranial nerves 2-12 are intact.     Motor: Motor function is intact.  Psychiatric:  Attention and Perception: Attention normal.        Mood and Affect: Mood normal.        Speech: Speech normal.        Behavior: Behavior normal. Behavior is cooperative.        Cognition and Memory: Cognition normal.      Labs on Admission: I have personally reviewed following labs and imaging studies Results for orders placed or performed during the hospital encounter of 08/15/22 (from the past 24 hour(s))  CBC with Differential     Status: None   Collection Time: 08/15/22 10:04 PM  Result Value Ref Range   WBC 5.5 4.0 - 10.5 K/uL   RBC 4.18 3.87 - 5.11 MIL/uL   Hemoglobin 12.8 12.0 - 15.0 g/dL   HCT 38.5 36.0 - 46.0 %   MCV 92.1 80.0 - 100.0 fL   MCH 30.6 26.0 - 34.0 pg   MCHC 33.2 30.0 - 36.0 g/dL   RDW 12.1 11.5 - 15.5 %   Platelets 177 150 - 400 K/uL   nRBC 0.0 0.0 - 0.2 %   Neutrophils Relative % 51 %   Neutro Abs 2.8 1.7 - 7.7 K/uL   Lymphocytes Relative 36 %   Lymphs Abs 1.9 0.7 - 4.0 K/uL   Monocytes Relative 10 %   Monocytes Absolute 0.6 0.1 - 1.0 K/uL   Eosinophils Relative 2 %   Eosinophils Absolute 0.1 0.0 - 0.5 K/uL   Basophils Relative 1 %   Basophils Absolute 0.1 0.0 - 0.1 K/uL   Immature Granulocytes 0 %   Abs Immature Granulocytes 0.02 0.00 - 0.07 K/uL  Basic metabolic panel     Status: Abnormal   Collection Time: 08/15/22 10:04 PM  Result Value Ref Range   Sodium 143 135 - 145 mmol/L   Potassium 2.8 (L) 3.5 - 5.1 mmol/L   Chloride 119 (H) 98 - 111 mmol/L   CO2 17 (L) 22 - 32 mmol/L   Glucose, Bld 77 70 - 99 mg/dL   BUN 12 8 - 23 mg/dL   Creatinine, Ser 0.51 0.44 - 1.00 mg/dL   Calcium 6.5 (L) 8.9 - 10.3 mg/dL   GFR, Estimated >60 >60 mL/min   Anion gap 7 5 - 15  Hepatic  function panel     Status: Abnormal   Collection Time: 08/15/22 10:04 PM  Result Value Ref Range   Total Protein 4.3 (L) 6.5 - 8.1 g/dL   Albumin 2.7 (L) 3.5 - 5.0 g/dL   AST 33 15 - 41 U/L   ALT 17 0 - 44 U/L   Alkaline Phosphatase 35 (L) 38 - 126 U/L   Total Bilirubin 0.8 0.3 - 1.2 mg/dL   Bilirubin, Direct 0.3 (H) 0.0 - 0.2 mg/dL   Indirect Bilirubin 0.5 0.3 - 0.9 mg/dL  Ethanol     Status: Abnormal   Collection Time: 08/15/22 10:04 PM  Result Value Ref Range   Alcohol, Ethyl (B) 24 (H) <10 mg/dL  Troponin I (High Sensitivity)     Status: None   Collection Time: 08/15/22 10:04 PM  Result Value Ref Range   Troponin I (High Sensitivity) 2 <18 ng/L  Brain natriuretic peptide     Status: None   Collection Time: 08/15/22 10:04 PM  Result Value Ref Range   B Natriuretic Peptide 93.2 0.0 - 100.0 pg/mL  Magnesium     Status: None   Collection Time: 08/15/22 10:04 PM  Result Value Ref Range   Magnesium 1.7  1.7 - 2.4 mg/dL  Urinalysis, Routine w reflex microscopic Urine, Clean Catch     Status: Abnormal   Collection Time: 08/15/22 10:39 PM  Result Value Ref Range   Color, Urine YELLOW (A) YELLOW   APPearance CLEAR (A) CLEAR   Specific Gravity, Urine 1.010 1.005 - 1.030   pH 6.0 5.0 - 8.0   Glucose, UA NEGATIVE NEGATIVE mg/dL   Hgb urine dipstick NEGATIVE NEGATIVE   Bilirubin Urine NEGATIVE NEGATIVE   Ketones, ur NEGATIVE NEGATIVE mg/dL   Protein, ur NEGATIVE NEGATIVE mg/dL   Nitrite NEGATIVE NEGATIVE   Leukocytes,Ua TRACE (A) NEGATIVE   WBC, UA NONE SEEN 0 - 5 WBC/hpf   Bacteria, UA NONE SEEN NONE SEEN   Squamous Epithelial / LPF NONE SEEN 0 - 5  Urine Drug Screen, Qualitative     Status: None   Collection Time: 08/15/22 10:39 PM  Result Value Ref Range   Tricyclic, Ur Screen NONE DETECTED NONE DETECTED   Amphetamines, Ur Screen NONE DETECTED NONE DETECTED   MDMA (Ecstasy)Ur Screen NONE DETECTED NONE DETECTED   Cocaine Metabolite,Ur [place] NONE DETECTED NONE DETECTED   Opiate,  Ur Screen NONE DETECTED NONE DETECTED   Phencyclidine (PCP) Ur S NONE DETECTED NONE DETECTED   Cannabinoid 50 Ng, Ur [place] NONE DETECTED NONE DETECTED   Barbiturates, Ur Screen NONE DETECTED NONE DETECTED   Benzodiazepine, Ur Scrn NONE DETECTED NONE DETECTED   Methadone Scn, Ur NONE DETECTED NONE DETECTED  SARS Coronavirus 2 by RT PCR (hospital order, performed in [place] hospital lab) *cepheid single result test* Anterior Nasal Swab     Status: None   Collection Time: 08/16/22 12:20 AM   Specimen: Anterior Nasal Swab  Result Value Ref Range   SARS Coronavirus 2 by RT PCR NEGATIVE NEGATIVE  Troponin I (High Sensitivity)     Status: None   Collection Time: 08/16/22 12:20 AM  Result Value Ref Range   Troponin I (High Sensitivity) 5 <18 ng/L  Magnesium     Status: None   Collection Time: 08/16/22 12:20 AM  Result Value Ref Range   Magnesium 1.9 1.7 - 2.4 mg/dL       Current Facility-Administered Medications:    0.9 %  sodium chloride infusion, , Intravenous, Continuous, Ante Arredondo V, MD   acetaminophen (TYLENOL) tablet 650 mg, 650 mg, Oral, Q6H PRN **OR** acetaminophen (TYLENOL) suppository 650 mg, 650 mg, Rectal, Q6H PRN, Para Skeans, MD   apixaban (ELIQUIS) tablet 5 mg, 5 mg, Oral, BID, Florina Ou V, MD, 5 mg at 08/16/22 0300   potassium chloride 10 mEq in 100 mL IVPB, 10 mEq, Intravenous, Q1 Hr x 2, Janina Trafton V, MD   sodium chloride flush (NS) 0.9 % injection 3 mL, 3 mL, Intravenous, Q12H, Para Skeans, MD  Current Outpatient Medications:    apixaban (ELIQUIS) 5 MG TABS tablet, Take 1 tablet (5 mg total) by mouth 2 (two) times daily., Disp: 180 tablet, Rfl: 1   gabapentin (NEURONTIN) 100 MG capsule, Take one capsule by mouth at bedtime. If tolerated, can increase to 1 capsule by mouth three times daily as needed for pain., Disp: 30 capsule, Rfl: 0   Ginseng 100 MG CAPS, Take 100 mg by mouth 4 (four) times a week., Disp: , Rfl:    hydrochlorothiazide (HYDRODIURIL) 25 MG  tablet, Take 1 tablet (25 mg total) by mouth daily., Disp: 90 tablet, Rfl: 0   tretinoin (RETIN-A) 0.05 % cream, Apply 1 application topically See admin instructions.  Every other night, Disp: , Rfl:    zolpidem (AMBIEN) 5 MG tablet, TAKE 1/2 TABLET BY MOUTH AT BEDTIME AS NEEDED SLEEP, Disp: 15 tablet, Rfl: 1  COVID-19 Labs No results for input(s): "DDIMER", "FERRITIN", "LDH", "CRP" in the last 72 hours. Lab Results  Component Value Date   [place] 08/16/2022   [place] Not Detected 09/16/2019    Radiological Exams on Admission: CT Head Wo Contrast  Result Date: 08/15/2022 CLINICAL DATA:  Trauma fall hypotensive EXAM: CT HEAD WITHOUT CONTRAST CT CERVICAL SPINE WITHOUT CONTRAST TECHNIQUE: Multidetector CT imaging of the head and cervical spine was performed following the standard protocol without intravenous contrast. Multiplanar CT image reconstructions of the cervical spine were also generated. RADIATION DOSE REDUCTION: This exam was performed according to the departmental dose-optimization program which includes automated exposure control, adjustment of the mA and/or kV according to patient size and/or use of iterative reconstruction technique. COMPARISON:  None Available. FINDINGS: CT HEAD FINDINGS Brain: No acute territorial infarction, hemorrhage or intracranial mass. The ventricles are nonenlarged. Vascular: No hyperdense vessels.  No unexpected calcification Skull: Normal. Negative for fracture or focal lesion. Sinuses/Orbits: No acute finding. Other: None CT CERVICAL SPINE FINDINGS Alignment: Straightening of the cervical spine. No subluxation. Facet alignment within normal limits Skull base and vertebrae: No acute fracture. No primary bone lesion or focal pathologic process. Soft tissues and spinal canal: No prevertebral fluid or swelling. No visible canal hematoma. Disc levels: Mild disc space narrowing and degenerative change C5-C6 and C6-C7. Upper chest: Negative.  Mild  apical scarring 2 Other: None IMPRESSION: 1. Negative non contrasted CT appearance of the brain for age 68. Straightening of the cervical spine. No acute osseous abnormality Electronically Signed   By: Donavan Foil M.D.   On: 08/15/2022 23:33   CT Cervical Spine Wo Contrast  Result Date: 08/15/2022 CLINICAL DATA:  Trauma fall hypotensive EXAM: CT HEAD WITHOUT CONTRAST CT CERVICAL SPINE WITHOUT CONTRAST TECHNIQUE: Multidetector CT imaging of the head and cervical spine was performed following the standard protocol without intravenous contrast. Multiplanar CT image reconstructions of the cervical spine were also generated. RADIATION DOSE REDUCTION: This exam was performed according to the departmental dose-optimization program which includes automated exposure control, adjustment of the mA and/or kV according to patient size and/or use of iterative reconstruction technique. COMPARISON:  None Available. FINDINGS: CT HEAD FINDINGS Brain: No acute territorial infarction, hemorrhage or intracranial mass. The ventricles are nonenlarged. Vascular: No hyperdense vessels.  No unexpected calcification Skull: Normal. Negative for fracture or focal lesion. Sinuses/Orbits: No acute finding. Other: None CT CERVICAL SPINE FINDINGS Alignment: Straightening of the cervical spine. No subluxation. Facet alignment within normal limits Skull base and vertebrae: No acute fracture. No primary bone lesion or focal pathologic process. Soft tissues and spinal canal: No prevertebral fluid or swelling. No visible canal hematoma. Disc levels: Mild disc space narrowing and degenerative change C5-C6 and C6-C7. Upper chest: Negative.  Mild apical scarring 2 Other: None IMPRESSION: 1. Negative non contrasted CT appearance of the brain for age 68. Straightening of the cervical spine. No acute osseous abnormality Electronically Signed   By: Donavan Foil M.D.   On: 08/15/2022 23:33   DG Chest Portable 1 View  Result Date: 08/15/2022 CLINICAL  DATA:  Repeated syncope EXAM: PORTABLE CHEST 1 VIEW COMPARISON:  08/18/2021 FINDINGS: Mild cardiomegaly. Diffuse increased interstitial opacity. No pleural effusion, consolidation or pneumothorax. IMPRESSION: Diffusely increased interstitial opacity compared to prior either due to interstitial inflammatory process or mild interstitial edema Electronically  Signed   By: Donavan Foil M.D.   On: 08/15/2022 22:47    EKG: Independently reviewed.  Sinus rhythm with a prolonged QTc of 567.    Assessment and Plan: * Syncope and collapse Patient describes a syncopal episode with collapse and loss of consciousness where she was on the floor when she woke up she called her daughter. Daughter called EMS and initial blood pressure was noted to be in systolic of 24M by patient report. Suspect this was iatrogenic etiology secondary to medications differentials also include cardiac etiology but due to her prolonged QTc and discussed with patient about further evaluation by cardiology and also obtaining an MRI of the brain to make sure she did not have a stroke.  Patient verbalized agreement and plan of care.   Abnormal EKG EKG shows prolonged QTc which is concerning which may have contributed to her syncopal episode as well. Attribute to underlying electrolyte abnormalities which we will correct and follow. We will consult cardiology as patient has a history of ablation with a repeat echocardiogram.  Hypocalcemia Replace and follow levels.   Hypokalemia Replace and follow levels.   Hypotension due to drugs Currently Home regimen of hydrochlorothiazide is held.  Essential hypertension Blood pressure (!) 143/99, pulse 60, temperature 98.4 F (36.9 C), temperature source Oral, resp. rate 14, SpO2 99 %. Home regimen of HCTZ currently held.  Acid reflux GERD on IV PPI therapy.    DVT prophylaxis:  Heparin.    Code Status:  Full code    Family Communication:  Rich,Cheryl L (Daughter)   [phone] (Home Phone)   Disposition Plan:  Home    Consults called:  None   Admission status: Observation.     Para Skeans MD [facility]  6 PM- 2 AM. Please contact me via secure Chat 6 PM-2 AM. [phone] ( Pager ) To contact the [facility] Attending or Consulting provider [place] or covering provider during after hours [place], for this patient.   Check the care team in [facility] and look for a) attending/consulting [facility] provider listed and b) the [facility] team listed Log into [email or web address] and use [place]'s universal password to access. If you do not have the password, please contact the hospital operator. Locate the [facility] provider you are looking for under [facility] and page to a number that you can be directly reached. If you still have difficulty reaching the provider, please page the [facility] (Director on Call) for the Hospitalists listed on amion for assistance. [email or web address] 08/16/2022, 3:43 AM

## 2022-08-15 NOTE — Assessment & Plan Note (Signed)
Acute on chronic xfew months Recommend follow up with podiatry  Also has bunion on foot

## 2022-08-15 NOTE — Assessment & Plan Note (Signed)
Chronic, slightly elevated today Goal <140/<90

## 2022-08-16 ENCOUNTER — Observation Stay (HOSPITAL_BASED_OUTPATIENT_CLINIC_OR_DEPARTMENT_OTHER)
Admit: 2022-08-16 | Discharge: 2022-08-16 | Disposition: A | Discharge status: Home / Self Care | Payer: PPO | Attending: Internal Medicine | Admitting: Internal Medicine

## 2022-08-16 ENCOUNTER — Observation Stay: Payer: PPO

## 2022-08-16 ENCOUNTER — Observation Stay (HOSPITAL_BASED_OUTPATIENT_CLINIC_OR_DEPARTMENT_OTHER)
Admit: 2022-08-16 | Discharge: 2022-08-16 | Disposition: A | Discharge status: Home / Self Care | Payer: PPO | Attending: Medical | Admitting: Medical

## 2022-08-16 ENCOUNTER — Encounter: Payer: Self-pay | Admitting: Internal Medicine

## 2022-08-16 DIAGNOSIS — E876 Hypokalemia: Secondary | ICD-10-CM

## 2022-08-16 DIAGNOSIS — R55 Syncope and collapse: Secondary | ICD-10-CM | POA: Diagnosis not present

## 2022-08-16 DIAGNOSIS — R9431 Abnormal electrocardiogram [ECG] [EKG]: Secondary | ICD-10-CM

## 2022-08-16 DIAGNOSIS — I952 Hypotension due to drugs: Secondary | ICD-10-CM | POA: Diagnosis not present

## 2022-08-16 DIAGNOSIS — I480 Paroxysmal atrial fibrillation: Secondary | ICD-10-CM

## 2022-08-16 DIAGNOSIS — I6523 Occlusion and stenosis of bilateral carotid arteries: Secondary | ICD-10-CM | POA: Diagnosis not present

## 2022-08-16 DIAGNOSIS — I959 Hypotension, unspecified: Secondary | ICD-10-CM | POA: Diagnosis not present

## 2022-08-16 DIAGNOSIS — I10 Essential (primary) hypertension: Secondary | ICD-10-CM

## 2022-08-16 DIAGNOSIS — I6782 Cerebral ischemia: Secondary | ICD-10-CM | POA: Diagnosis not present

## 2022-08-16 DIAGNOSIS — J341 Cyst and mucocele of nose and nasal sinus: Secondary | ICD-10-CM | POA: Diagnosis not present

## 2022-08-16 LAB — COMPREHENSIVE METABOLIC PANEL
ALT: 48 U/L — ABNORMAL HIGH (ref 0–44)
AST: 44 U/L — ABNORMAL HIGH (ref 15–41)
Albumin: 3.6 g/dL (ref 3.5–5.0)
Alkaline Phosphatase: 58 U/L (ref 38–126)
Anion gap: 6 (ref 5–15)
BUN: 14 mg/dL (ref 8–23)
CO2: 26 mmol/L (ref 22–32)
Calcium: 8.7 mg/dL — ABNORMAL LOW (ref 8.9–10.3)
Chloride: 108 mmol/L (ref 98–111)
Creatinine, Ser: 0.65 mg/dL (ref 0.44–1.00)
GFR, Estimated: >60 mL/min (ref >60)
Glucose, Bld: 110 mg/dL — ABNORMAL HIGH (ref 70–99)
Potassium: 3.6 mmol/L (ref 3.5–5.1)
Sodium: 140 mmol/L (ref 135–145)
Total Bilirubin: 0.8 mg/dL (ref 0.3–1.2)
Total Protein: 5.8 g/dL — ABNORMAL LOW (ref 6.5–8.1)

## 2022-08-16 LAB — CBC
HCT: 37.9 % (ref 36.0–46.0)
Hemoglobin: 12.9 g/dL (ref 12.0–15.0)
MCH: 30.1 pg (ref 26.0–34.0)
MCHC: 34 g/dL (ref 30.0–36.0)
MCV: 88.6 fL (ref 80.0–100.0)
Platelets: 205 10*3/uL (ref 150–400)
RBC: 4.28 MIL/uL (ref 3.87–5.11)
RDW: 12.1 % (ref 11.5–15.5)
WBC: 8 10*3/uL (ref 4.0–10.5)
nRBC: 0 % (ref 0.0–0.2)

## 2022-08-16 LAB — ECHOCARDIOGRAM COMPLETE BUBBLE STUDY
Area-P 1/2: 4.17 cm2
Calc EF: 63.1 %
S' Lateral: 2.2 cm
Single Plane A2C EF: 66.3 %
Single Plane A4C EF: 62.8 %

## 2022-08-16 LAB — MAGNESIUM
Magnesium: 1.7 mg/dL (ref 1.7–2.4)
Magnesium: 1.9 mg/dL (ref 1.7–2.4)

## 2022-08-16 LAB — BRAIN NATRIURETIC PEPTIDE: B Natriuretic Peptide: 93.2 pg/mL (ref 0.0–100.0)

## 2022-08-16 LAB — TROPONIN I (HIGH SENSITIVITY): Troponin I (High Sensitivity): 5 ng/L (ref <18)

## 2022-08-16 LAB — T4, FREE: Free T4: 0.85 ng/dL (ref 0.61–1.12)

## 2022-08-16 LAB — TSH: TSH: 2.572 u[IU]/mL (ref 0.350–4.500)

## 2022-08-16 LAB — SARS CORONAVIRUS 2 BY RT PCR: SARS Coronavirus 2 by RT PCR: NEGATIVE

## 2022-08-16 MED ORDER — MAGNESIUM OXIDE 400 MG PO CAPS: 400.0000 mg | ORAL_CAPSULE | Freq: Every day | ORAL | 0 refills | Status: AC

## 2022-08-16 MED ORDER — SODIUM CHLORIDE 0.9 % IV SOLN: INTRAVENOUS | Status: DC

## 2022-08-16 MED ORDER — MAGNESIUM SULFATE 2 GM/50ML IV SOLN
2.0000 g | Freq: Once | INTRAVENOUS | Status: AC
  Administered 2022-08-16: 2 g via INTRAVENOUS
  Filled 2022-08-16: qty 50

## 2022-08-16 MED ORDER — PERFLUTREN LIPID MICROSPHERE
1.0000 mL | INTRAVENOUS | Status: DC | PRN
  Administered 2022-08-16: 2 mL via INTRAVENOUS

## 2022-08-16 MED ORDER — HEPARIN SODIUM (PORCINE) 5000 UNIT/ML IJ SOLN
5000.0000 [IU] | Freq: Two times a day (BID) | INTRAMUSCULAR | Status: DC
Start: 2022-08-16 — End: 2022-08-16

## 2022-08-16 MED ORDER — POTASSIUM CHLORIDE CRYS ER 20 MEQ PO TBCR
40.0000 meq | EXTENDED_RELEASE_TABLET | Freq: Once | ORAL | Status: AC
  Administered 2022-08-16: 40 meq via ORAL
  Filled 2022-08-16: qty 2

## 2022-08-16 MED ORDER — PERFLUTREN LIPID MICROSPHERE: 1.0000 mL | INTRAVENOUS | Status: DC | PRN

## 2022-08-16 MED ORDER — SODIUM CHLORIDE 0.9% FLUSH
3.0000 mL | Freq: Two times a day (BID) | INTRAVENOUS | Status: DC
  Administered 2022-08-16: 3 mL via INTRAVENOUS

## 2022-08-16 MED ORDER — ACETAMINOPHEN 650 MG RE SUPP: 650.0000 mg | Freq: Four times a day (QID) | RECTAL | Status: DC | PRN

## 2022-08-16 MED ORDER — APIXABAN 2.5 MG PO TABS: 2.5000 mg | ORAL_TABLET | Freq: Two times a day (BID) | ORAL | Status: DC

## 2022-08-16 MED ORDER — CALCIUM GLUCONATE-NACL 1-0.675 GM/50ML-% IV SOLN
1.0000 g | Freq: Once | INTRAVENOUS | Status: AC
Start: 2022-08-16 — End: 2022-08-16
  Administered 2022-08-16: 1000 mg via INTRAVENOUS
  Filled 2022-08-16: qty 50

## 2022-08-16 MED ORDER — POTASSIUM CHLORIDE CRYS ER 10 MEQ PO TBCR: 10.0000 meq | EXTENDED_RELEASE_TABLET | Freq: Every day | ORAL | 0 refills | Status: DC

## 2022-08-16 MED ORDER — POTASSIUM CHLORIDE 10 MEQ/100ML IV SOLN: 10.0000 meq | INTRAVENOUS | Status: AC

## 2022-08-16 MED ORDER — ACETAMINOPHEN 325 MG PO TABS: 650.0000 mg | ORAL_TABLET | Freq: Four times a day (QID) | ORAL | Status: DC | PRN

## 2022-08-16 MED ORDER — APIXABAN 5 MG PO TABS
5.0000 mg | ORAL_TABLET | Freq: Two times a day (BID) | ORAL | Status: DC
  Administered 2022-08-16 (×2): 5 mg via ORAL
  Filled 2022-08-16 (×2): qty 1

## 2022-08-16 MED ORDER — APIXABAN 2.5 MG PO TABS: 2.5000 mg | ORAL_TABLET | Freq: Two times a day (BID) | ORAL | 0 refills | Status: DC

## 2022-08-16 NOTE — Assessment & Plan Note (Deleted)
GERD on IV PPI therapy.

## 2022-08-16 NOTE — Consult Note (Signed)
Cardiology Consultation   Patient ID: Alexis Lyons MRN: [id number]; DOB: 12/03/1941  Admit date: 08/15/2022 Date of Consult: 08/16/2022  PCP:  Virginia Crews, [place] Providers Cardiologist:  Kate Sable, MD  Electrophysiologist:  Vickie Epley, MD  {  Patient Profile:   Alexis Lyons is a 81 y.o. female with a hx of HTN, Pafib s/p ablation 11/02/21 who is being seen 08/16/2022 for the evaluation of syncope at the request of dr. Leslye Peer.  History of Present Illness:   Alexis Lyons is followed by Dr. Garen Lah. Echo 04/2021 showed LVEF 60-65%, impaired relaxation. H/o of Paroxysmal Afib with CHADSVASC of 4. She was referred to EP for persistent Afib with elevated rates, and underwent ablation 11/02/21.  The patient presented to [facility] ED 8/30 for syncope x 2. She was out for dinner and had 2 Cosmos. She drove home and felt OK. She sat down for a couple minutes and stood up to get ready for bed. She felt dizzy and lightheaded an passed out. NO injuries and unsure how long she was out for. She woke up and went into the bedroom where she felt dizzy and lightheaded and fell on her left knee. She couldn't get up so she called her children, who called EMS.   In the ER BP initially low 45/23>54/26. 119/63, Pulse 83bpm, RR18, afebrile, O2 96%. Labs showed K 2.8, chloride 119, CO2 178, calcium 6.5, Albumin 2.7, alk phos 35, Alcohol level 24. UA with trace leukocytes. EKG showed NSR 80bpm with qtc 577m and nonspecific T wave changes. CXR showed diffusely increased interstitial opacity. CT head/neck negative. She was given Potassium and IVF and admitted.   She reports she drinks 1 glass of wine three nights a week. No tobacco or drug use.    Past Medical History:  Diagnosis Date   Amnesia    Aphthae    Atrial fibrillation ([place]    Congestion-fibrosis syndrome    DD (diverticular disease)    GERD (gastroesophageal reflux disease)    Seizures ([place]  12/2017   passed out due to dehydration    Past Surgical History:  Procedure Laterality Date   ATRIAL FIBRILLATION ABLATION N/A 11/02/2021   Procedure: [place]  Surgeon: LVickie Epley MD;  [facility];  Service: Cardiovascular;  Laterality: N/A;   CHOLECYSTECTOMY     colonoscopy with polypectomy     8/11 4 mm polyp, 12 mm polyp   COLONOSCOPY WITH PROPOFOL N/A 02/24/2018   Procedure: COLONOSCOPY WITH PROPOFOL;  Surgeon: EManya Silvas MD;  [facility];  Service: Endoscopy;  Laterality: N/A;   HERNIA REPAIR Right 2004   inguinal    HERNIA REPAIR     x's 3     Home Medications:  Prior to Admission medications   Medication Sig Start Date End Date Taking? Authorizing Provider  apixaban (ELIQUIS) 5 MG TABS tablet Take 1 tablet (5 mg total) by mouth 2 (two) times daily. 07/25/22   AKate Sable MD  gabapentin (NEURONTIN) 100 MG capsule Take one capsule by mouth at bedtime. If tolerated, can increase to 1 capsule by mouth three times daily as needed for pain. 05/08/22   RMyles Gip DO  Ginseng 100 MG CAPS Take 100 mg by mouth 4 (four) times a week. 06/08/09   [provider]  hydrochlorothiazide (HYDRODIURIL) 25 MG tablet Take 1 tablet (25 mg total) by mouth daily. 06/12/22   RMyles Gip DO  tretinoin (RETIN-A) 0.05 % cream Apply 1 application topically See admin instructions. Every other night    [provider]  zolpidem (AMBIEN) 5 MG tablet TAKE 1/2 TABLET BY MOUTH AT BEDTIME AS NEEDED SLEEP 06/12/22   Myles Gip, DO    Inpatient Medications: Scheduled Meds:  apixaban  5 mg Oral BID   sodium chloride flush  3 mL Intravenous Q12H   Continuous Infusions:  sodium chloride 50 mL/hr at 08/16/22 0443   magnesium sulfate bolus IVPB 2 g (08/16/22 0931)   PRN Meds: acetaminophen **OR** acetaminophen  Allergies:    Allergies  Allergen Reactions   Codeine Other (See Comments)    Messed up  head really bad    Social History:   Social History   Socioeconomic History   Marital status: Divorced    Spouse name: Not on file   Number of children: 2   Years of education: 15   Highest education level: Not on file  Occupational History   Occupation: [employer] in [place] Use   Smoking status: Former    Packs/day: 0.50    Years: 15.00    Total pack years: 7.50    Types: Cigarettes    Quit date: 12/16/1985    Years since quitting: 36.6   Smokeless tobacco: Never  Vaping Use   Vaping Use: Never used  Substance and Sexual Activity   Alcohol use: Yes    Alcohol/week: 7.0 standard drinks of alcohol    Types: 7 Glasses of wine per week    Comment: glass of wine with dinner   Drug use: No   Sexual activity: Not Currently  Other Topics Concern   Not on file  Social History Narrative   Not on file   Social Determinants of Health   Financial Resource Strain: Not on file  Food Insecurity: Not on file  Transportation Needs: Not on file  Physical Activity: Not on file  Stress: Not on file  Social Connections: Not on file  Intimate Partner Violence: Not on file    Family History:    Family History  Problem Relation Age of Onset   Alzheimer's disease Mother    Alcohol abuse Father    Cancer Father        liver cancer   Cancer Sister        breast   Breast cancer Sister      ROS:  Please see the history of present illness.   All other ROS reviewed and negative.     Physical Exam/Data:   Vitals:   08/16/22 0200 08/16/22 0235 08/16/22 0404 08/16/22 0421  BP: (!) 143/99  (!) 152/86   Pulse: (!) 138 60 (!) 58   Resp: (!) '21 14 18   ' Temp:  98.4 F (36.9 C) 98.2 F (36.8 C)   TempSrc:  Oral    SpO2: 94% 99% 100%   Weight:    53.3 kg  Height:    '5\' 3"'  (1.6 m)   No intake or output data in the 24 hours ending 08/16/22 1007    08/16/2022    4:21 AM 08/15/2022    1:08 PM 06/18/2022    2:10 PM  Last 3 Weights  Weight (lbs) 117 lb 8 oz 117 lb 119 lb   Weight (kg) 53.298 kg 53.071 kg 53.978 kg     Body mass index is 20.81 kg/m.  General:  Well nourished, well developed, in no acute distress HEENT: normal Neck: no JVD Vascular: No  carotid bruits; Distal pulses 2+ bilaterally Cardiac:  normal S1, S2; RRR; no murmur  Lungs:  clear to auscultation bilaterally, no wheezing, rhonchi or rales  Abd: soft, nontender, no hepatomegaly  Ext: no edema Musculoskeletal:  No deformities, BUE and BLE strength normal and equal Skin: warm and dry  Neuro:  CNs 2-12 intact, no focal abnormalities noted Psych:  Normal affect   EKG:  The EKG was personally reviewed and demonstrates:  NSR 80bpm, qtc 557m, nonspecific T wave changes Telemetry:  Telemetry was personally reviewed and demonstrates:  SR/SB, HR 50-60s  Relevant CV Studies:  10/2021 cardiac CTA EXAM: Cardiac CT/CTA   TECHNIQUE: The patient was scanned on a Siemens Somatom scanner.   FINDINGS: A 120 kV prospective scan was triggered in the descending thoracic aorta at 111 HU's. Gantry rotation speed was 280 msecs and collimation was .9 mm. No beta blockade and no NTG was given. The 3D data set was reconstructed in 5% intervals of the 60-80 % of the R-R cycle. Diastolic phases were analyzed on a dedicated work station using MPR, MIP and VRT modes. The patient received 75 cc of contrast.   There is normal pulmonary vein drainage into the left atrium (3 on the right and 2 on the left) with ostial measurements as follows:   RUPV: 18 x 13.4 mm   Small RMPV: 7 x 6.5 mm   RLPV: 20 x 16 mm   LUPV: 15.6 x 12 mm   LLPV: 14 x 9 mm   The left atrial appendage is a Windsock type with ostial size 20 x 12 mm and length 23 mm. There is no thrombus in the left atrial appendage.   The esophagus runs in the left atrial midline and is not in the proximity to any of the pulmonary veins.   Aorta:  Normal caliber.  No dissection or calcifications.   Aortic Valve:  Trileaflet.  No  calcifications.   Coronary Arteries: Normal coronary origin. Right dominance. The study was performed without use of NTG and insufficient for plaque evaluation.   IMPRESSION: 1. There is normal pulmonary vein drainage into the left atrium (3 on the right and 2 on the left).   2. The left atrial appendage is a Windsock type with ostial size 20 x 12 mm and length 23 mm. There is no thrombus in the left atrial appendage.   3. The esophagus runs in the left atrial midline and is not in the proximity to any of the pulmonary veins.   BKate Sable    Electronically Signed   By: BKate SableM.D.   On: 10/30/2021 17:02  Echo 04/2021 1. Left ventricular ejection fraction, by estimation, is 60 to 65%. The  left ventricle has normal function. The left ventricle has no regional  wall motion abnormalities. Left ventricular diastolic parameters are  consistent with Grade I diastolic  dysfunction (impaired relaxation). The average left ventricular global  longitudinal strain is -16.5 %. The global longitudinal strain is normal.   2. Right ventricular systolic function is normal. The right ventricular  size is normal. There is normal pulmonary artery systolic pressure. The  estimated right ventricular systolic pressure is 352.7mmHg.   3. Tricuspid valve regurgitation is mild to moderate.   4. There is borderline dilatation of the aortic root, measuring 38 mm.    Laboratory Data:  High Sensitivity Troponin:   Recent Labs  Lab 08/15/22 2204 08/16/22 0020  TROPONINIHS 2 5     Chemistry  Recent Labs  Lab 08/15/22 1328 08/15/22 2204 08/16/22 0020 08/16/22 0508  NA 141 143  --  140  K 3.4* 2.8*  --  3.6  CL 99 119*  --  108  CO2 27 17*  --  26  GLUCOSE 93 77  --  110*  BUN 14 12  --  14  CREATININE 0.76 0.51  --  0.65  CALCIUM 9.6 6.5*  --  8.7*  MG  --  1.7 1.9  --   GFRNONAA  --  >60  --  >60  ANIONGAP  --  7  --  6    Recent Labs  Lab 08/15/22 1328  08/15/22 2204 08/16/22 0508  PROT 7.0 4.3* 5.8*  ALBUMIN 4.7 2.7* 3.6  AST 21 33 44*  ALT 15 17 48*  ALKPHOS 74 35* 58  BILITOT 0.7 0.8 0.8   Lipids  Recent Labs  Lab 08/15/22 1328  CHOL 171  TRIG 114  HDL 66  LABVLDL 20  LDLCALC 85  CHOLHDL 2.6    Hematology Recent Labs  Lab 08/15/22 1328 08/15/22 2204 08/16/22 0508  WBC 5.1 5.5 8.0  RBC 5.01 4.18 4.28  HGB 15.2 12.8 12.9  HCT 44.9 38.5 37.9  MCV 90 92.1 88.6  MCH 30.3 30.6 30.1  MCHC 33.9 33.2 34.0  RDW 12.0 12.1 12.1  PLT 241 177 205   Thyroid  Recent Labs  Lab 08/16/22 0508  TSH 2.572  FREET4 0.85    BNP Recent Labs  Lab 08/15/22 2204  BNP 93.2    DDimer No results for input(s): "DDIMER" in the last 168 hours.   Radiology/Studies:  US Carotid Bilateral  Result Date: 08/16/2022 CLINICAL DATA:  Syncope and collapse EXAM: BILATERAL CAROTID DUPLEX ULTRASOUND TECHNIQUE: Pearline Cables scale imaging, color Doppler and duplex ultrasound were performed of bilateral carotid and vertebral arteries in the neck. COMPARISON:  None available FINDINGS: Criteria: Quantification of carotid stenosis is based on velocity parameters that correlate the residual internal carotid diameter with NASCET-based stenosis levels, using the diameter of the distal internal carotid lumen as the denominator for stenosis measurement. The following velocity measurements were obtained: RIGHT ICA: 67/25 cm/sec CCA: 68/03 cm/sec SYSTOLIC ICA/CCA RATIO:  1.2 ECA: 67 cm/sec LEFT ICA: 81/33 cm/sec CCA: 21/22 cm/sec SYSTOLIC ICA/CCA RATIO:  1.3 ECA: 68 cm/sec RIGHT CAROTID ARTERY: Minimal atheromatous plaque of the carotid bifurcation. RIGHT VERTEBRAL ARTERY:  Antegrade flow. LEFT CAROTID ARTERY: Minimal atheromatous plaque of the carotid bifurcation. LEFT VERTEBRAL ARTERY:  Antegrade flow. IMPRESSION: No significant stenosis of internal carotid arteries. Electronically Signed   By: Miachel Roux M.D.   On: 08/16/2022 09:11   MR BRAIN WO CONTRAST  Result  Date: 08/16/2022 CLINICAL DATA:  Initial evaluation for acute syncope/presyncope. EXAM: MRI HEAD WITHOUT CONTRAST TECHNIQUE: Multiplanar, multiecho pulse sequences of the brain and surrounding structures were obtained without intravenous contrast. COMPARISON:  Prior head CT from 08/15/2022. FINDINGS: Brain: Cerebral volume within normal limits for age. Scattered and patchy T2/FLAIR hyperintensity involving the periventricular and deep white matter both cerebral hemispheres, most consistent with chronic small vessel ischemic disease, mild for age. No evidence for acute or subacute infarct. Gray-white matter differentiation maintained. No areas of chronic cortical infarction. No acute or chronic intracranial blood products. No mass lesion, midline shift or mass effect. No hydrocephalus or extra-axial fluid collection. Pituitary gland and suprasellar region within normal limits. Vascular: Major intracranial vascular flow voids are maintained. Skull and upper cervical spine: Craniocervical junction normal. Bone marrow signal  intensity within normal limits. No scalp soft tissue abnormality. Sinuses/Orbits: Patient status post bilateral ocular lens replacement. Scattered mucosal thickening noted throughout the ethmoidal air cells and maxillary sinuses. Few scattered superimposed retention cyst noted within the left greater than right maxillary sinuses as well. No significant mastoid effusion. Other: None. IMPRESSION: 1. No acute intracranial abnormality. 2. Mild chronic microvascular ischemic disease for age. Electronically Signed   By: Jeannine Boga M.D.   On: 08/16/2022 04:33   CT Head Wo Contrast  Result Date: 08/15/2022 CLINICAL DATA:  Trauma fall hypotensive EXAM: CT HEAD WITHOUT CONTRAST CT CERVICAL SPINE WITHOUT CONTRAST TECHNIQUE: Multidetector CT imaging of the head and cervical spine was performed following the standard protocol without intravenous contrast. Multiplanar CT image reconstructions of the  cervical spine were also generated. RADIATION DOSE REDUCTION: This exam was performed according to the departmental dose-optimization program which includes automated exposure control, adjustment of the mA and/or kV according to patient size and/or use of iterative reconstruction technique. COMPARISON:  None Available. FINDINGS: CT HEAD FINDINGS Brain: No acute territorial infarction, hemorrhage or intracranial mass. The ventricles are nonenlarged. Vascular: No hyperdense vessels.  No unexpected calcification Skull: Normal. Negative for fracture or focal lesion. Sinuses/Orbits: No acute finding. Other: None CT CERVICAL SPINE FINDINGS Alignment: Straightening of the cervical spine. No subluxation. Facet alignment within normal limits Skull base and vertebrae: No acute fracture. No primary bone lesion or focal pathologic process. Soft tissues and spinal canal: No prevertebral fluid or swelling. No visible canal hematoma. Disc levels: Mild disc space narrowing and degenerative change C5-C6 and C6-C7. Upper chest: Negative.  Mild apical scarring 2 Other: None IMPRESSION: 1. Negative non contrasted CT appearance of the brain for age 9. Straightening of the cervical spine. No acute osseous abnormality Electronically Signed   By: Donavan Foil M.D.   On: 08/15/2022 23:33   CT Cervical Spine Wo Contrast  Result Date: 08/15/2022 CLINICAL DATA:  Trauma fall hypotensive EXAM: CT HEAD WITHOUT CONTRAST CT CERVICAL SPINE WITHOUT CONTRAST TECHNIQUE: Multidetector CT imaging of the head and cervical spine was performed following the standard protocol without intravenous contrast. Multiplanar CT image reconstructions of the cervical spine were also generated. RADIATION DOSE REDUCTION: This exam was performed according to the departmental dose-optimization program which includes automated exposure control, adjustment of the mA and/or kV according to patient size and/or use of iterative reconstruction technique. COMPARISON:  None  Available. FINDINGS: CT HEAD FINDINGS Brain: No acute territorial infarction, hemorrhage or intracranial mass. The ventricles are nonenlarged. Vascular: No hyperdense vessels.  No unexpected calcification Skull: Normal. Negative for fracture or focal lesion. Sinuses/Orbits: No acute finding. Other: None CT CERVICAL SPINE FINDINGS Alignment: Straightening of the cervical spine. No subluxation. Facet alignment within normal limits Skull base and vertebrae: No acute fracture. No primary bone lesion or focal pathologic process. Soft tissues and spinal canal: No prevertebral fluid or swelling. No visible canal hematoma. Disc levels: Mild disc space narrowing and degenerative change C5-C6 and C6-C7. Upper chest: Negative.  Mild apical scarring 2 Other: None IMPRESSION: 1. Negative non contrasted CT appearance of the brain for age 9. Straightening of the cervical spine. No acute osseous abnormality Electronically Signed   By: Donavan Foil M.D.   On: 08/15/2022 23:33   DG Chest Portable 1 View  Result Date: 08/15/2022 CLINICAL DATA:  Repeated syncope EXAM: PORTABLE CHEST 1 VIEW COMPARISON:  08/18/2021 FINDINGS: Mild cardiomegaly. Diffuse increased interstitial opacity. No pleural effusion, consolidation or pneumothorax. IMPRESSION: Diffusely increased interstitial opacity compared to  prior either due to interstitial inflammatory process or mild interstitial edema Electronically Signed   By: Donavan Foil M.D.   On: 08/15/2022 22:47     Assessment and Plan:   Syncope - presented with syncope x 2 after having 2 Cosmo drinks. On arrival she was hypotensive with hypokalemia and prolonged qtc. Given IVF and potassium - imaging head and neck unremarkable.  - MRI brain negative - US carotids with mild plaque - echo ordered - check orthostatics - Tele shows SR/SB with no high grade HB and qtc wnl - re-check EKG - Keep Mag>2 and K>4 - TSH wnl - alcohol level 24 - will need heart monitor at discharge. Syncope  possibly a combination of factors with alcohol intake, hypokalemia, low BP, ?dehydration, HCTZ use  Paroxysmal Afib - s/p ablation in 2022 - in NSR - continue Eliquis 97m BID  Hypotension H/o HTN - PTA HCTZ held - BP improved, would try another BP med at discharge  For questions or updates, please contact CBakerPlease consult [email or web address] for contact info under    Signed, [place] HNinfa Meeker PA-C  08/16/2022 10:07 AM

## 2022-08-16 NOTE — Progress Notes (Signed)
Blood chemistry is normal outside of electrolyte, Potassium which is slightly low at 3.4 mmol/L. Recommend increase in dietary sources to assist. Could add additional medication to assist with potassium stores, spiro, if desired.  A1c shows prediabetes. Continue to recommend balanced, lower carb meals. Smaller meal size, adding snacks. Choosing water as drink of choice and increasing purposeful exercise.  Cholesterol is improved; I recommend diet low in saturated fat and regular exercise - 30 min at least 5 times per week  All other labs are normal and stable.  Alexis Lyons, [address] [place], [place] [address] [phone] (phone) [phone] (fax) [place]

## 2022-08-16 NOTE — Plan of Care (Signed)
  Problem: Education: Goal: Knowledge of General Education information will improve Description: Including pain rating scale, medication(s)/side effects and non-pharmacologic comfort measures 08/16/2022 1355 by Alferd Apa, RN Outcome: Adequate for Discharge 08/16/2022 0818 by Alferd Apa, RN Outcome: Progressing   Problem: Health Behavior/Discharge Planning: Goal: Ability to manage health-related needs will improve 08/16/2022 1355 by Alferd Apa, RN Outcome: Adequate for Discharge 08/16/2022 0818 by Alferd Apa, RN Outcome: Progressing   Problem: Clinical Measurements: Goal: Ability to maintain clinical measurements within normal limits will improve 08/16/2022 1355 by Alferd Apa, RN Outcome: Adequate for Discharge 08/16/2022 0818 by Alferd Apa, RN Outcome: Progressing Goal: Will remain free from infection 08/16/2022 1355 by Alferd Apa, RN Outcome: Adequate for Discharge 08/16/2022 0818 by Alferd Apa, RN Outcome: Progressing Goal: Diagnostic test results will improve 08/16/2022 1355 by Alferd Apa, RN Outcome: Adequate for Discharge 08/16/2022 0818 by Alferd Apa, RN Outcome: Progressing Goal: Respiratory complications will improve 08/16/2022 1355 by Alferd Apa, RN Outcome: Adequate for Discharge 08/16/2022 0818 by Alferd Apa, RN Outcome: Progressing Goal: Cardiovascular complication will be avoided 08/16/2022 1355 by Alferd Apa, RN Outcome: Adequate for Discharge 08/16/2022 0818 by Alferd Apa, RN Outcome: Progressing   Problem: Activity: Goal: Risk for activity intolerance will decrease 08/16/2022 1355 by Alferd Apa, RN Outcome: Adequate for Discharge 08/16/2022 0818 by Alferd Apa, RN Outcome: Progressing   Problem: Nutrition: Goal: Adequate nutrition will be maintained 08/16/2022 1355 by Alferd Apa, RN Outcome: Adequate for Discharge 08/16/2022 0818 by Alferd Apa, RN Outcome: Progressing   Problem: Coping: Goal: Level of anxiety  will decrease 08/16/2022 1355 by Alferd Apa, RN Outcome: Adequate for Discharge 08/16/2022 0818 by Alferd Apa, RN Outcome: Progressing   Problem: Elimination: Goal: Will not experience complications related to bowel motility 08/16/2022 1355 by Alferd Apa, RN Outcome: Adequate for Discharge 08/16/2022 0818 by Alferd Apa, RN Outcome: Progressing Goal: Will not experience complications related to urinary retention 08/16/2022 1355 by Alferd Apa, RN Outcome: Adequate for Discharge 08/16/2022 0818 by Alferd Apa, RN Outcome: Progressing   Problem: Pain Managment: Goal: General experience of comfort will improve 08/16/2022 1355 by Alferd Apa, RN Outcome: Adequate for Discharge 08/16/2022 0818 by Alferd Apa, RN Outcome: Progressing   Problem: Safety: Goal: Ability to remain free from injury will improve 08/16/2022 1355 by Alferd Apa, RN Outcome: Adequate for Discharge 08/16/2022 0818 by Alferd Apa, RN Outcome: Progressing   Problem: Skin Integrity: Goal: Risk for impaired skin integrity will decrease 08/16/2022 1355 by Alferd Apa, RN Outcome: Adequate for Discharge 08/16/2022 0818 by Alferd Apa, RN Outcome: Progressing

## 2022-08-16 NOTE — Discharge Summary (Signed)
Physician Discharge Summary   Patient: Alexis Lyons MRN: [id number] DOB: October 12, 1941  Admit date:     08/15/2022  Discharge date: 08/16/22  Discharge Physician: Alexis Lyons   PCP: Alexis Crews, Lyons   Recommendations at discharge:   Follow-up PCP 5 days Follow-up cardiology 3 weeks  Discharge Diagnoses: Principal Problem:   Syncope and collapse Active Problems:   Hypotension due to drugs   Hypokalemia   Hypomagnesemia   Hypocalcemia   Paroxysmal atrial fibrillation (HCC)   Abnormal EKG   Essential hypertension   Hospital Course: The patient was brought in as an observation for syncope and collapse.  Patient stated that she went out to dinner had 2 cosmetics and felt a little faint and ended up passing out.  When she came through the was walking and then had another fall.  When EMS arrived she was hypotensive.  When she came into the emergency room her potassium and calcium was very low.  The patient was repleted with electrolytes.  Her hydrochlorothiazide was stopped.  Initial QTc was elevated but in reviewing with cardiology computer probably measured it long.  Repeat QTc 443.  Patient was hydrated and electrolytes were replaced.  Patient was feeling much better on the day of discharge and discharged home in stable condition.  Assessment and Plan: * Syncope and collapse Could be vasovagal in nature with hypotension when the EMS arrived.  Recommend discontinuing hydrochlorothiazide.  Patient also had electrolyte abnormalities.  Patient much improved.  Cardiology recommending a monitor upon discharge.  No arrhythmias seen on monitor while here.  Stroke work-up negative.   Hypotension due to drugs Discontinue hydrochlorothiazide  Hypokalemia Replaced during the hospital course we will give 5 days a replacement upon discharge.  Hypomagnesemia Replaced IV while here.  Hypocalcemia Replaced on admission and came up to 8.7.  Abnormal EKG Cardiology does not  believe that this was a prolonged QTc.  Repeat QTc 443  Paroxysmal atrial fibrillation (HCC) Switched Eliquis to low-dose as per pharmacy recommendations  Essential hypertension Hold hydrochlorothiazide.  Hydrochlorothiazide not a good medication for this patient with hypokalemia hypomagnesemia and hypocalcemia.  Patient will go home off blood pressure medications at this point.         Consultants: Cardiology Procedures performed: None Disposition: Home Diet recommendation:  Regular diet DISCHARGE MEDICATION: Allergies as of 08/16/2022       Reactions   Codeine Other (See Comments)   Messed up head really bad        Medication List     STOP taking these medications    gabapentin 100 MG capsule Commonly known as: NEURONTIN   hydrochlorothiazide 25 MG tablet Commonly known as: HYDRODIURIL   tretinoin 0.05 % cream Commonly known as: RETIN-A   zolpidem 5 MG tablet Commonly known as: AMBIEN       TAKE these medications    apixaban 2.5 MG Tabs tablet Commonly known as: ELIQUIS Take 1 tablet (2.5 mg total) by mouth 2 (two) times daily. What changed:  medication strength how much to take   Ginseng 100 MG Caps Take 100 mg by mouth 4 (four) times a week.   Magnesium Oxide 400 MG Caps Take 1 capsule (400 mg total) by mouth daily for 5 days.   potassium chloride 10 MEQ tablet Commonly known as: KLOR-CON M Take 1 tablet (10 mEq total) by mouth daily for 5 days.        Follow-up Information     Lyons, Alexis Bucy, Lyons Follow up  in 5 day(s).   Specialty: Family Medicine Contact information: [address] [place] [address] [phone]         Alexis Lyons Follow up in 3 week(s).   Specialty: Cardiology Contact information: [place] [address] [phone]                Discharge Exam: [name]   08/16/22 0421  Weight: 53.3 kg   Physical Exam HENT:     Head:  Normocephalic.     Mouth/Throat:     Pharynx: No oropharyngeal exudate.  Eyes:     General: Lids are normal.     Conjunctiva/sclera: Conjunctivae normal.  Cardiovascular:     Rate and Rhythm: Normal rate and regular rhythm.     Heart sounds: Normal heart sounds, S1 normal and S2 normal.  Pulmonary:     Breath sounds: No decreased breath sounds, wheezing, rhonchi or rales.  Abdominal:     Palpations: Abdomen is soft.     Tenderness: There is no abdominal tenderness.  Musculoskeletal:     Right lower leg: No swelling.     Left lower leg: No swelling.  Skin:    Comments: Bruising outside of left knee  Neurological:     Mental Status: She is alert and oriented to person, place, and time.      Condition at discharge: stable  The results of significant diagnostics from this hospitalization (including imaging, microbiology, ancillary and laboratory) are listed below for reference.   Imaging Studies: ECHOCARDIOGRAM COMPLETE BUBBLE STUDY  Result Date: 08/16/2022    ECHOCARDIOGRAM REPORT   Patient Name:   Alexis Lyons Date of Exam: 08/16/2022 Medical Rec #:  [id number]        Height:       63.0 in Accession #:    [id number]       Weight:       117.5 lb Date of Birth:  [birth year]        BSA:          1.542 m Patient Age:    81 years         BP:           130/80 mmHg Patient Gender: F                HR:           54 bpm. Exam Location:  Inpatient Procedure: 2D Echo, Color Doppler, Cardiac Doppler, Saline Contrast Bubble Study            and Intracardiac Opacification Agent Indications:     R55 Syncope  History:         Patient has prior history of Echocardiogram examinations, most                  recent 04/20/2021. Arrythmias:Atrial Fibrillation; Risk                  Factors:GERD.  Sonographer:     Bernadene Person RDCS Referring Phys:  Alexis Lyons Diagnosing Phys: Alexis Rogue Lyons IMPRESSIONS  1. Left ventricular ejection fraction, by estimation, is 55 to 60%. The left ventricle has  normal function. The left ventricle has no regional wall motion abnormalities. Left ventricular diastolic parameters are consistent with Grade I diastolic dysfunction (impaired relaxation).  2. Right ventricular systolic function is normal. The right ventricular size is normal. There is normal pulmonary artery systolic pressure. The estimated right  ventricular systolic pressure is 39.7 mmHg.  3. The mitral valve is normal in structure. No evidence of mitral valve regurgitation. No evidence of mitral stenosis.  4. Tricuspid valve regurgitation is moderate.  5. The aortic valve is tricuspid. Aortic valve regurgitation is not visualized. No aortic stenosis is present.  6. There is borderline dilatation of the ascending aorta, measuring 36 mm.  7. The inferior vena cava is normal in size with greater than 50% respiratory variability, suggesting right atrial pressure of 3 mmHg. FINDINGS  Left Ventricle: Left ventricular ejection fraction, by estimation, is 55 to 60%. The left ventricle has normal function. The left ventricle has no regional wall motion abnormalities. Definity contrast agent was given IV to delineate the left ventricular  endocardial borders. The left ventricular internal cavity size was normal in size. There is no left ventricular hypertrophy. Left ventricular diastolic parameters are consistent with Grade I diastolic dysfunction (impaired relaxation). Right Ventricle: The right ventricular size is normal. No increase in right ventricular wall thickness. Right ventricular systolic function is normal. There is normal pulmonary artery systolic pressure. The tricuspid regurgitant velocity is 2.46 m/s, and  with an assumed right atrial pressure of 3 mmHg, the estimated right ventricular systolic pressure is 67.3 mmHg. Left Atrium: Left atrial size was normal in size. Right Atrium: Right atrial size was normal in size. Pericardium: There is no evidence of pericardial effusion. Mitral Valve: The mitral valve  is normal in structure. No evidence of mitral valve regurgitation. No evidence of mitral valve stenosis. Tricuspid Valve: The tricuspid valve is normal in structure. Tricuspid valve regurgitation is moderate . No evidence of tricuspid stenosis. Aortic Valve: The aortic valve is tricuspid. Aortic valve regurgitation is not visualized. No aortic stenosis is present. Pulmonic Valve: The pulmonic valve was normal in structure. Pulmonic valve regurgitation is not visualized. No evidence of pulmonic stenosis. Aorta: The aortic root is normal in size and structure. There is borderline dilatation of the ascending aorta, measuring 36 mm. Venous: The inferior vena cava is normal in size with greater than 50% respiratory variability, suggesting right atrial pressure of 3 mmHg. IAS/Shunts: No atrial level shunt detected by color flow Doppler. Agitated saline contrast was given intravenously to evaluate for intracardiac shunting.  LEFT VENTRICLE PLAX 2D LVIDd:         3.80 cm     Diastology LVIDs:         2.20 cm     LV e' medial:    4.82 cm/s LV PW:         0.90 cm     LV E/e' medial:  17.6 LV IVS:        0.70 cm     LV e' lateral:   6.75 cm/s LVOT diam:     2.10 cm     LV E/e' lateral: 12.6 LV SV:         86 LV SV Index:   56 LVOT Area:     3.46 cm  LV Volumes (MOD) LV vol d, MOD A2C: 40.4 ml LV vol d, MOD A4C: 61.3 ml LV vol s, MOD A2C: 13.6 ml LV vol s, MOD A4C: 22.8 ml LV SV MOD A2C:     26.8 ml LV SV MOD A4C:     61.3 ml LV SV MOD BP:      32.9 ml RIGHT VENTRICLE RV S prime:     11.40 cm/s TAPSE (M-mode): 3.1 cm LEFT ATRIUM  Index        RIGHT ATRIUM           Index LA diam:        3.20 cm 2.07 cm/m   RA Area:     20.40 cm LA Vol (A2C):   45.1 ml 29.24 ml/m  RA Volume:   59.80 ml  38.77 ml/m LA Vol (A4C):   43.1 ml 27.94 ml/m LA Biplane Vol: 45.7 ml 29.63 ml/m  AORTIC VALVE LVOT Vmax:   108.00 cm/s LVOT Vmean:  71.700 cm/s LVOT VTI:    0.249 m  AORTA Ao Root diam: 3.20 cm Ao Asc diam:  3.60 cm MITRAL  VALVE               TRICUSPID VALVE MV Area (PHT): 4.17 cm    TR Peak grad:   24.2 mmHg MV Decel Time: 182 msec    TR Vmax:        246.00 cm/s MV E velocity: 84.80 cm/s MV A velocity: 67.10 cm/s  SHUNTS MV E/A ratio:  1.26        Systemic VTI:  0.25 m                            Systemic Diam: 2.10 cm Alexis Rogue Lyons Electronically signed by Alexis Rogue Lyons Signature Date/Time: 08/16/2022/1:32:25 PM    Final    US Carotid Bilateral  Result Date: 08/16/2022 CLINICAL DATA:  Syncope and collapse EXAM: BILATERAL CAROTID DUPLEX ULTRASOUND TECHNIQUE: Pearline Cables scale imaging, color Doppler and duplex ultrasound were performed of bilateral carotid and vertebral arteries in the neck. COMPARISON:  None available FINDINGS: Criteria: Quantification of carotid stenosis is based on velocity parameters that correlate the residual internal carotid diameter with NASCET-based stenosis levels, using the diameter of the distal internal carotid lumen as the denominator for stenosis measurement. The following velocity measurements were obtained: RIGHT ICA: 67/25 cm/sec CCA: 95/62 cm/sec SYSTOLIC ICA/CCA RATIO:  1.2 ECA: 67 cm/sec LEFT ICA: 81/33 cm/sec CCA: 13/08 cm/sec SYSTOLIC ICA/CCA RATIO:  1.3 ECA: 68 cm/sec RIGHT CAROTID ARTERY: Minimal atheromatous plaque of the carotid bifurcation. RIGHT VERTEBRAL ARTERY:  Antegrade flow. LEFT CAROTID ARTERY: Minimal atheromatous plaque of the carotid bifurcation. LEFT VERTEBRAL ARTERY:  Antegrade flow. IMPRESSION: No significant stenosis of internal carotid arteries. Electronically Signed   By: Miachel Roux M.D.   On: 08/16/2022 09:11   MR BRAIN WO CONTRAST  Result Date: 08/16/2022 CLINICAL DATA:  Initial evaluation for acute syncope/presyncope. EXAM: MRI HEAD WITHOUT CONTRAST TECHNIQUE: Multiplanar, multiecho pulse sequences of the brain and surrounding structures were obtained without intravenous contrast. COMPARISON:  Prior head CT from 08/15/2022. FINDINGS: Brain: Cerebral volume  within normal limits for age. Scattered and patchy T2/FLAIR hyperintensity involving the periventricular and deep white matter both cerebral hemispheres, most consistent with chronic small vessel ischemic disease, mild for age. No evidence for acute or subacute infarct. Gray-white matter differentiation maintained. No areas of chronic cortical infarction. No acute or chronic intracranial blood products. No mass lesion, midline shift or mass effect. No hydrocephalus or extra-axial fluid collection. Pituitary gland and suprasellar region within normal limits. Vascular: Major intracranial vascular flow voids are maintained. Skull and upper cervical spine: Craniocervical junction normal. Bone marrow signal intensity within normal limits. No scalp soft tissue abnormality. Sinuses/Orbits: Patient status post bilateral ocular lens replacement. Scattered mucosal thickening noted throughout the ethmoidal air cells and maxillary sinuses. Few scattered superimposed retention cyst noted within the left  greater than right maxillary sinuses as well. No significant mastoid effusion. Other: None. IMPRESSION: 1. No acute intracranial abnormality. 2. Mild chronic microvascular ischemic disease for age. Electronically Signed   By: Jeannine Boga M.D.   On: 08/16/2022 04:33   CT Head Wo Contrast  Result Date: 08/15/2022 CLINICAL DATA:  Trauma fall hypotensive EXAM: CT HEAD WITHOUT CONTRAST CT CERVICAL SPINE WITHOUT CONTRAST TECHNIQUE: Multidetector CT imaging of the head and cervical spine was performed following the standard protocol without intravenous contrast. Multiplanar CT image reconstructions of the cervical spine were also generated. RADIATION DOSE REDUCTION: This exam was performed according to the departmental dose-optimization program which includes automated exposure control, adjustment of the mA and/or kV according to patient size and/or use of iterative reconstruction technique. COMPARISON:  None Available.  FINDINGS: CT HEAD FINDINGS Brain: No acute territorial infarction, hemorrhage or intracranial mass. The ventricles are nonenlarged. Vascular: No hyperdense vessels.  No unexpected calcification Skull: Normal. Negative for fracture or focal lesion. Sinuses/Orbits: No acute finding. Other: None CT CERVICAL SPINE FINDINGS Alignment: Straightening of the cervical spine. No subluxation. Facet alignment within normal limits Skull base and vertebrae: No acute fracture. No primary bone lesion or focal pathologic process. Soft tissues and spinal canal: No prevertebral fluid or swelling. No visible canal hematoma. Disc levels: Mild disc space narrowing and degenerative change C5-C6 and C6-C7. Upper chest: Negative.  Mild apical scarring 2 Other: None IMPRESSION: 1. Negative non contrasted CT appearance of the brain for age 41. Straightening of the cervical spine. No acute osseous abnormality Electronically Signed   By: Donavan Foil M.D.   On: 08/15/2022 23:33   CT Cervical Spine Wo Contrast  Result Date: 08/15/2022 CLINICAL DATA:  Trauma fall hypotensive EXAM: CT HEAD WITHOUT CONTRAST CT CERVICAL SPINE WITHOUT CONTRAST TECHNIQUE: Multidetector CT imaging of the head and cervical spine was performed following the standard protocol without intravenous contrast. Multiplanar CT image reconstructions of the cervical spine were also generated. RADIATION DOSE REDUCTION: This exam was performed according to the departmental dose-optimization program which includes automated exposure control, adjustment of the mA and/or kV according to patient size and/or use of iterative reconstruction technique. COMPARISON:  None Available. FINDINGS: CT HEAD FINDINGS Brain: No acute territorial infarction, hemorrhage or intracranial mass. The ventricles are nonenlarged. Vascular: No hyperdense vessels.  No unexpected calcification Skull: Normal. Negative for fracture or focal lesion. Sinuses/Orbits: No acute finding. Other: None CT CERVICAL  SPINE FINDINGS Alignment: Straightening of the cervical spine. No subluxation. Facet alignment within normal limits Skull base and vertebrae: No acute fracture. No primary bone lesion or focal pathologic process. Soft tissues and spinal canal: No prevertebral fluid or swelling. No visible canal hematoma. Disc levels: Mild disc space narrowing and degenerative change C5-C6 and C6-C7. Upper chest: Negative.  Mild apical scarring 2 Other: None IMPRESSION: 1. Negative non contrasted CT appearance of the brain for age 41. Straightening of the cervical spine. No acute osseous abnormality Electronically Signed   By: Donavan Foil M.D.   On: 08/15/2022 23:33   DG Chest Portable 1 View  Result Date: 08/15/2022 CLINICAL DATA:  Repeated syncope EXAM: PORTABLE CHEST 1 VIEW COMPARISON:  08/18/2021 FINDINGS: Mild cardiomegaly. Diffuse increased interstitial opacity. No pleural effusion, consolidation or pneumothorax. IMPRESSION: Diffusely increased interstitial opacity compared to prior either due to interstitial inflammatory process or mild interstitial edema Electronically Signed   By: Donavan Foil M.D.   On: 08/15/2022 22:47    Microbiology: Results for orders placed or performed during the hospital  encounter of 08/15/22  SARS Coronavirus 2 by RT PCR (hospital order, performed in [facility] hospital lab) *cepheid single result test* Anterior Nasal Swab     Status: None   Collection Time: 08/16/22 12:20 AM   Specimen: Anterior Nasal Swab  Result Value Ref Range Status   SARS Coronavirus 2 by RT PCR NEGATIVE NEGATIVE Final    Comment: (NOTE) SARS-CoV-2 target nucleic acids are NOT DETECTED.  The SARS-CoV-2 RNA is generally detectable in upper and lower respiratory specimens during the acute phase of infection. The lowest concentration of SARS-CoV-2 viral copies this assay can detect is 250 copies / mL. A negative result does not preclude SARS-CoV-2 infection and should not be used as the sole basis for  treatment or other patient management decisions.  A negative result may occur with improper specimen collection / handling, submission of specimen other than nasopharyngeal swab, presence of viral mutation(s) within the areas targeted by this assay, and inadequate number of viral copies (<250 copies / mL). A negative result must be combined with clinical observations, patient history, and epidemiological information.  Fact Sheet for Patients:   https://www.patel.info/  Fact Sheet for Healthcare Providers: https://hall.com/  This test is not yet approved or  cleared by the [place] FDA and has been authorized for detection and/or diagnosis of SARS-CoV-2 by FDA under an Emergency Use Authorization (EUA).  This EUA will remain in effect (meaning this test can be used) for the duration of the COVID-19 declaration under Section 564(b)(1) of the Act, 21 U.S.C. section 360bbb-3(b)(1), unless the authorization is terminated or revoked sooner.  Performed at [facility], [address] [place], [place] [address]     Labs: CBC: Recent Labs  Lab 08/15/22 1328 08/15/22 2204 08/16/22 0508  WBC 5.1 5.5 8.0  NEUTROABS 3.0 2.8  --   HGB 15.2 12.8 12.9  HCT 44.9 38.5 37.9  MCV 90 92.1 88.6  PLT 241 177 947   Basic Metabolic Panel: Recent Labs  Lab 08/15/22 1328 08/15/22 2204 08/16/22 0020 08/16/22 0508  NA 141 143  --  140  K 3.4* 2.8*  --  3.6  CL 99 119*  --  108  CO2 27 17*  --  26  GLUCOSE 93 77  --  110*  BUN 14 12  --  14  CREATININE 0.76 0.51  --  0.65  CALCIUM 9.6 6.5*  --  8.7*  MG  --  1.7 1.9  --    Liver Function Tests: Recent Labs  Lab 08/15/22 1328 08/15/22 2204 08/16/22 0508  AST 21 33 44*  ALT 15 17 48*  ALKPHOS 74 35* 58  BILITOT 0.7 0.8 0.8  PROT 7.0 4.3* 5.8*  ALBUMIN 4.7 2.7* 3.6   CBG: No results for input(s): "GLUCAP" in the last 168 hours.  Discharge time spent: greater than 30  minutes. Case discussed with cardiology.  Reviewed EKGs with cardiology Signed: Loletha Grayer, Lyons [facility] 08/16/2022

## 2022-08-16 NOTE — Assessment & Plan Note (Addendum)
Hold hydrochlorothiazide.  Hydrochlorothiazide not a good medication for this patient with hypokalemia hypomagnesemia and hypocalcemia.  Patient will go home off blood pressure medications at this point.

## 2022-08-16 NOTE — Plan of Care (Signed)

## 2022-08-16 NOTE — TOC Progression Note (Signed)
Transition of Care [facility]) - Progression Note    Patient Details  Name: Alexis Lyons MRN: [id number] Date of Birth: May 04, 1941  Transition of Care [facility]) CM/SW [place], RN Phone Number: 08/16/2022, 2:03 PM  Clinical Narrative:      Transition of Care [facility]) Screening Note   Patient Details  Name: Alexis Lyons Date of Birth: [birth year]   Transition of Care [facility]) CM/SW Contact:    Conception Oms, RN Phone Number: 08/16/2022, 2:03 PM    Transition of Care Department [facility]) has reviewed patient and no TOC needs have been identified at this time. We will continue to monitor patient advancement through interdisciplinary progression rounds. If new patient transition needs arise, please place a TOC consult.         Expected Discharge Plan and Services           Expected Discharge Date: 08/16/22                                     Social Determinants of Health (SDOH) Interventions    Readmission Risk Interventions     No data to display

## 2022-08-16 NOTE — Assessment & Plan Note (Addendum)
Replaced during the hospital course we will give 5 days a replacement upon discharge.

## 2022-08-16 NOTE — ED Notes (Signed)
Report given to [facility].

## 2022-08-16 NOTE — Progress Notes (Signed)
Pt ambulated in hall with Chief Strategy Officer. Pt was able to ambulate at brisk pace around nurse's station with no dizziness, syncopal episodes, or other adverse events.

## 2022-08-16 NOTE — Assessment & Plan Note (Addendum)
Cardiology does not believe that this was a prolonged QTc.  Repeat QTc 443

## 2022-08-16 NOTE — Assessment & Plan Note (Signed)
Replaced IV while here.

## 2022-08-16 NOTE — Assessment & Plan Note (Addendum)
Could be vasovagal in nature with hypotension when the EMS arrived.  Recommend discontinuing hydrochlorothiazide.  Patient also had electrolyte abnormalities.  Patient much improved.  Cardiology recommending a monitor upon discharge.  No arrhythmias seen on monitor while here.  Stroke work-up negative.

## 2022-08-16 NOTE — Assessment & Plan Note (Addendum)
Replaced on admission and came up to 8.7.

## 2022-08-16 NOTE — Assessment & Plan Note (Addendum)
Discontinue hydrochlorothiazide

## 2022-08-16 NOTE — Assessment & Plan Note (Signed)
Switched Eliquis to low-dose as per pharmacy recommendations

## 2022-08-17 ENCOUNTER — Telehealth: Payer: Self-pay | Admitting: Medical

## 2022-08-17 DIAGNOSIS — R55 Syncope and collapse: Secondary | ICD-10-CM | POA: Diagnosis not present

## 2022-08-17 NOTE — Telephone Encounter (Signed)
New Message:     Patient says she is wearing a Monitor and she have to go out of town and  have some questions please.

## 2022-08-18 LAB — COMPREHENSIVE METABOLIC PANEL
ALT: 15 IU/L (ref 0–32)
AST: 21 IU/L (ref 0–40)
Albumin/Globulin Ratio: 2 (ref 1.2–2.2)
Albumin: 4.7 g/dL (ref 3.8–4.8)
Alkaline Phosphatase: 74 IU/L (ref 44–121)
BUN/Creatinine Ratio: 18 (ref 12–28)
BUN: 14 mg/dL (ref 8–27)
Bilirubin Total: 0.7 mg/dL (ref 0.0–1.2)
CO2: 27 mmol/L (ref 20–29)
Calcium: 9.6 mg/dL (ref 8.7–10.3)
Chloride: 99 mmol/L (ref 96–106)
Creatinine, Ser: 0.76 mg/dL (ref 0.57–1.00)
Globulin, Total: 2.3 g/dL (ref 1.5–4.5)
Glucose: 93 mg/dL (ref 70–99)
Potassium: 3.4 mmol/L — ABNORMAL LOW (ref 3.5–5.2)
Sodium: 141 mmol/L (ref 134–144)
Total Protein: 7 g/dL (ref 6.0–8.5)
eGFR: 79 mL/min/{1.73_m2} (ref >59)

## 2022-08-18 LAB — CBC WITH DIFFERENTIAL/PLATELET
Basophils Absolute: 0 10*3/uL (ref 0.0–0.2)
Basos: 1 %
EOS (ABSOLUTE): 0.2 10*3/uL (ref 0.0–0.4)
Eos: 4 %
Hematocrit: 44.9 % (ref 34.0–46.6)
Hemoglobin: 15.2 g/dL (ref 11.1–15.9)
Immature Grans (Abs): 0 10*3/uL (ref 0.0–0.1)
Immature Granulocytes: 0 %
Lymphocytes Absolute: 1.4 10*3/uL (ref 0.7–3.1)
Lymphs: 27 %
MCH: 30.3 pg (ref 26.6–33.0)
MCHC: 33.9 g/dL (ref 31.5–35.7)
MCV: 90 fL (ref 79–97)
Monocytes Absolute: 0.4 10*3/uL (ref 0.1–0.9)
Monocytes: 8 %
Neutrophils Absolute: 3 10*3/uL (ref 1.4–7.0)
Neutrophils: 60 %
Platelets: 241 10*3/uL (ref 150–450)
RBC: 5.01 x10E6/uL (ref 3.77–5.28)
RDW: 12 % (ref 11.7–15.4)
WBC: 5.1 10*3/uL (ref 3.4–10.8)

## 2022-08-18 LAB — HEMOGLOBIN A1C
Est. average glucose Bld gHb Est-mCnc: 117 mg/dL
Hgb A1c MFr Bld: 5.7 % — ABNORMAL HIGH (ref 4.8–5.6)

## 2022-08-18 LAB — LIPID PANEL
Chol/HDL Ratio: 2.6 ratio (ref 0.0–4.4)
Cholesterol, Total: 171 mg/dL (ref 100–199)
HDL: 66 mg/dL (ref >39)
LDL Chol Calc (NIH): 85 mg/dL (ref 0–99)
Triglycerides: 114 mg/dL (ref 0–149)
VLDL Cholesterol Cal: 20 mg/dL (ref 5–40)

## 2022-08-18 LAB — B12 AND FOLATE PANEL
Folate: 9.8 ng/mL (ref >3.0)
Vitamin B-12: 542 pg/mL (ref 232–1245)

## 2022-08-18 LAB — VITAMIN B6: Vitamin B6: 14.5 ug/L (ref 3.4–65.2)

## 2022-08-21 ENCOUNTER — Ambulatory Visit: Payer: Self-pay | Admitting: *Deleted

## 2022-08-21 DIAGNOSIS — R519 Headache, unspecified: Secondary | ICD-10-CM | POA: Diagnosis not present

## 2022-08-21 DIAGNOSIS — Z7901 Long term (current) use of anticoagulants: Secondary | ICD-10-CM | POA: Diagnosis not present

## 2022-08-21 DIAGNOSIS — R200 Anesthesia of skin: Secondary | ICD-10-CM | POA: Diagnosis not present

## 2022-08-21 DIAGNOSIS — W500XXA Accidental hit or strike by another person, initial encounter: Secondary | ICD-10-CM | POA: Diagnosis not present

## 2022-08-21 DIAGNOSIS — S0990XA Unspecified injury of head, initial encounter: Secondary | ICD-10-CM | POA: Diagnosis not present

## 2022-08-21 NOTE — Telephone Encounter (Signed)
Reviewed. Agree with ED evaluation given hx of anticoagulation in setting of head injury.    Eulis Foster, MD [facility]

## 2022-08-21 NOTE — Telephone Encounter (Addendum)
Spoke with the patient. Patient has a Zio AT placed will she was in the hospital. She forgets at time to keep the transmission box close to her and wants to make sure there hasn't been any issue with her monitor transmission. Adv the patient to contact [employer] for them to check on that. Adv her to try her best to keep the transmission box with in 3 ft. Pt sts that she was also calling to see if she would be able to fly. She has already done so and plans on returning home next week.

## 2022-08-21 NOTE — Telephone Encounter (Signed)
  Chief Complaint: Head Injury Symptoms: "Whacked head" on car door getting into car.Right side, top of head. Sore, no laceration. Frequency: 2 hours ago Pertinent Negatives: Patient denies headache, visual changes, nausea, vomiting ,disorientation, LOC. Disposition: '[x]'$ ED /'[]'$ Urgent Care (no appt availability in office) / '[]'$ Appointment(In office/virtual)/ '[]'$  [facility]/ '[]'$ Home Care/ '[]'$ Refused Recommended Disposition /'[]'$ [facility]/ '[]'$  Follow-up with PCP Additional Notes: Pt is on Eliquis. Advised ED, States will follow disposition.  Reason for Disposition  Taking Coumadin (warfarin) or other strong blood thinner, or known bleeding disorder (e.g., thrombocytopenia)  Answer Assessment - Initial Assessment Questions 1. MECHANISM: "How did the injury happen?" For falls, ask: "What height did you fall from?" and "What surface did you fall against?"      Bumped head getting into car 2. ONSET: "When did the injury happen?" (Minutes or hours ago)      2 hours ago 3. NEUROLOGIC SYMPTOMS: "Was there any loss of consciousness?" "Are there any other neurological symptoms?"      No 4. MENTAL STATUS: "Does the person know who they are, who you are, and where they are?"      No 5. LOCATION: "What part of the head was hit?"      Right top of head 6. SCALP APPEARANCE: "What does the scalp look like? Is it bleeding now?" If Yes, ask: "Is it difficult to stop?"      No bleeding 7. SIZE: For cuts, bruises, or swelling, ask: "How large is it?" (e.g., inches or centimeters)      None 8. PAIN: "Is there any pain?" If Yes, ask: "How bad is it?"  (e.g., Scale 1-10; or mild, moderate, severe)     "Sore" 9. TETANUS: For any breaks in the skin, ask: "When was the last tetanus booster?"     NA 10. OTHER SYMPTOMS: "Do you have any other symptoms?" (e.g., neck pain, vomiting)       No  Protocols used: Head Injury-A-AH

## 2022-08-29 ENCOUNTER — Ambulatory Visit: Payer: Self-pay | Admitting: *Deleted

## 2022-08-29 DIAGNOSIS — R030 Elevated blood-pressure reading, without diagnosis of hypertension: Secondary | ICD-10-CM | POA: Diagnosis not present

## 2022-08-29 DIAGNOSIS — F439 Reaction to severe stress, unspecified: Secondary | ICD-10-CM | POA: Diagnosis not present

## 2022-08-29 DIAGNOSIS — I4891 Unspecified atrial fibrillation: Secondary | ICD-10-CM | POA: Diagnosis not present

## 2022-08-29 DIAGNOSIS — Z7901 Long term (current) use of anticoagulants: Secondary | ICD-10-CM | POA: Diagnosis not present

## 2022-08-29 DIAGNOSIS — I10 Essential (primary) hypertension: Secondary | ICD-10-CM | POA: Diagnosis not present

## 2022-08-29 DIAGNOSIS — E876 Hypokalemia: Secondary | ICD-10-CM | POA: Diagnosis not present

## 2022-08-29 NOTE — Telephone Encounter (Signed)
Summary: BP questions, elevated   Pt called and has medication questions regarding her blood pressure.   Best contact: [phone]      Chief Complaint: bp 338 systolic Symptoms: none Frequency: daily Pertinent Negatives: Patient denies na Disposition: '[]'$ ED /'[]'$ Urgent Care (no appt availability in office) / '[]'$ Appointment(In office/virtual)/ '[]'$  [facility]/ '[x]'$ Home Care/ '[]'$ Refused Recommended Disposition /'[]'$ [facility]/ '[x]'$  Follow-up with PCP Additional Notes: Pt in hospital 08/21/22 for low bp and they stopped her HCTZ and now BP creeping up. She wants advice on restarting it, if should be every day or other day or a scale to go by. She is going out of town but will be reached on her cell.  Reason for Disposition  [3] Systolic BP  >= 291 OR Diastolic >= 80 AND [9] taking BP medications  Answer Assessment - Initial Assessment Questions 1. BLOOD PRESSURE: "What is the blood pressure?" "Did you take at least two measurements 5 minutes apart?"     Was low, ED stopped BP meds, now 166 systolic 2. ONSET: "When did you take your blood pressure?"     Everyday, keeps climbing but took bp med today 3. HOW: "How did you take your blood pressure?" (e.g., automatic home BP monitor, visiting nurse)     cuff 4. HISTORY: "Do you have a history of high blood pressure?"     yes 5. MEDICINES: "Are you taking any medicines for blood pressure?" "Have you missed any doses recently?"     Off since ED 6. OTHER SYMPTOMS: "Do you have any symptoms?" (e.g., blurred vision, chest pain, difficulty breathing, headache, weakness)     No, creeping up 7. PREGNANCY: "Is there any chance you are pregnant?" "When was your last menstrual period?"     na  Protocols used: Blood Pressure - High-A-AH

## 2022-08-30 NOTE — Telephone Encounter (Signed)
I would recommend a virtual visit if she will not be in town in the next 1-2 weeks to have in person visit to discuss BP readings and recommendations for medication adjustments.    Eulis Foster, MD

## 2022-08-31 NOTE — Progress Notes (Signed)
The radiologist report of the chest x-ray clearly says there is a possibility of interstitial edema like CHF.  The BNP was ordered to help differentiate this from just increased markings or pneumonia etc.

## 2022-08-31 NOTE — Telephone Encounter (Signed)
Patient advised of provider's recommendation. Per patient she decided to go on vacation after the ER doctors recommendations and states she is going to wait until her appointment with Dr. Jacinto Alexis Lyons.

## 2022-09-07 NOTE — Addendum Note (Signed)
Encounter addended by: Valora Corporal, RN on: 09/07/2022 1:27 PM  Actions taken: Imaging Exam ended

## 2022-09-07 NOTE — Addendum Note (Signed)
Encounter addended by: Valora Corporal, RN on: 09/07/2022 1:28 PM  Actions taken: Imaging Exam ended

## 2022-09-11 ENCOUNTER — Telehealth: Payer: Self-pay

## 2022-09-11 MED ORDER — METOPROLOL SUCCINATE ER 25 MG PO TB24
12.5000 mg | ORAL_TABLET | Freq: Every day | ORAL | Status: DC
Start: 2022-09-11 — End: 2022-09-14

## 2022-09-11 NOTE — Telephone Encounter (Signed)
-----   Message from [place], PA-C sent at 09/11/2022 10:15 AM EDT ----- Heart monitor showed NSR with 8788 SVT runs, longest 6 minutes. Can we start Lopressor 12.'5mg'$  BID and she needs to see EP.

## 2022-09-11 NOTE — Telephone Encounter (Addendum)
Patient made aware of monitor results and Alexis Lyons, [place]. Patient sts that she has taken metoprolol in the past and did not do well. She has metoprolol succinate 25 mg tablets on hand and will start 12.5 mg daily. Adv the pt to keep her scheduled 10/3 appt with SH. Patient has a hx of AFIB ablation with Dr. Quentin Ore and is past due for f/u with him. Adv the pt that I will fwd a msg to scheduling to call her to schedule an EP f/u.  Pt verbalized understanding.

## 2022-09-12 ENCOUNTER — Ambulatory Visit: Payer: PPO | Admitting: Podiatry

## 2022-09-12 DIAGNOSIS — D2262 Melanocytic nevi of left upper limb, including shoulder: Secondary | ICD-10-CM | POA: Diagnosis not present

## 2022-09-12 DIAGNOSIS — S80861A Insect bite (nonvenomous), right lower leg, initial encounter: Secondary | ICD-10-CM | POA: Diagnosis not present

## 2022-09-12 DIAGNOSIS — D2261 Melanocytic nevi of right upper limb, including shoulder: Secondary | ICD-10-CM | POA: Diagnosis not present

## 2022-09-12 DIAGNOSIS — L720 Epidermal cyst: Secondary | ICD-10-CM | POA: Diagnosis not present

## 2022-09-12 DIAGNOSIS — L400 Psoriasis vulgaris: Secondary | ICD-10-CM | POA: Diagnosis not present

## 2022-09-12 DIAGNOSIS — D485 Neoplasm of uncertain behavior of skin: Secondary | ICD-10-CM | POA: Diagnosis not present

## 2022-09-12 DIAGNOSIS — D225 Melanocytic nevi of trunk: Secondary | ICD-10-CM | POA: Diagnosis not present

## 2022-09-12 DIAGNOSIS — D2272 Melanocytic nevi of left lower limb, including hip: Secondary | ICD-10-CM | POA: Diagnosis not present

## 2022-09-13 ENCOUNTER — Telehealth: Payer: Self-pay | Admitting: Cardiology

## 2022-09-13 NOTE — Telephone Encounter (Signed)
Patient stated she wanted a medication to slow her heart rate. Lat month, she fainted twice and banged her head. Went to ED. K+ was low, given IV K+ and was told to take oral K+ for 5 days. Patient stated she has been taking K+ since and eating potatoes and bananas. She is not on HCTZ any more. She did wear Zio recently. Tuesday BP 149/94, P 64. Wednesday BP 159/90. Patient has not been taking met succ 12.5 mg. I informed her to take one-half of the 56m tablet as soon as we get off the phone. She had not been taking met succ because it made her feel sleepy. This patient needs intensive education on medication and treatment regimen. Did education of K+ and met succ. Do you want patient to get BMET before her appointment with you tomorrow?

## 2022-09-13 NOTE — Telephone Encounter (Signed)
Pt c/o medication issue:  1. Name of Medication:    2. How are you currently taking this medication (dosage and times per day)?    3. Are you having a reaction (difficulty breathing--STAT)? no  4. What is your medication issue? Calling to see what medication she can be on to slow her hr down. Please advise

## 2022-09-14 ENCOUNTER — Other Ambulatory Visit
Admission: RE | Admit: 2022-09-14 | Discharge: 2022-09-14 | Disposition: A | Discharge status: Home / Self Care | Payer: PPO | Source: Ambulatory Visit | Attending: Cardiology | Admitting: Cardiology

## 2022-09-14 ENCOUNTER — Ambulatory Visit: Payer: PPO | Admitting: Cardiology

## 2022-09-14 ENCOUNTER — Ambulatory Visit: Payer: PPO | Attending: Cardiology | Admitting: Cardiology

## 2022-09-14 ENCOUNTER — Encounter: Payer: Self-pay | Admitting: Cardiology

## 2022-09-14 VITALS — BP 138/90 | HR 57 | Ht 63.5 in | Wt 118.6 lb

## 2022-09-14 DIAGNOSIS — I480 Paroxysmal atrial fibrillation: Secondary | ICD-10-CM | POA: Diagnosis not present

## 2022-09-14 DIAGNOSIS — I10 Essential (primary) hypertension: Secondary | ICD-10-CM | POA: Insufficient documentation

## 2022-09-14 LAB — BASIC METABOLIC PANEL
Anion gap: 7 (ref 5–15)
BUN: 15 mg/dL (ref 8–23)
CO2: 28 mmol/L (ref 22–32)
Calcium: 9.1 mg/dL (ref 8.9–10.3)
Chloride: 107 mmol/L (ref 98–111)
Creatinine, Ser: 0.69 mg/dL (ref 0.44–1.00)
GFR, Estimated: >60 mL/min (ref >60)
Glucose, Bld: 104 mg/dL — ABNORMAL HIGH (ref 70–99)
Potassium: 3.9 mmol/L (ref 3.5–5.1)
Sodium: 142 mmol/L (ref 135–145)

## 2022-09-14 MED ORDER — METOPROLOL SUCCINATE ER 25 MG PO TB24: 12.5000 mg | ORAL_TABLET | Freq: Every day | ORAL | 1 refills | Status: DC

## 2022-09-14 MED ORDER — APIXABAN 2.5 MG PO TABS: 2.5000 mg | ORAL_TABLET | Freq: Two times a day (BID) | ORAL | 5 refills | Status: DC

## 2022-09-14 NOTE — Patient Instructions (Signed)
Medication Instructions:   Your physician recommends that you continue on your current medications as directed. Please refer to the Current Medication list given to you today.   *If you need a refill on your cardiac medications before your next appointment, please call your pharmacy*  Lab Work:  Please go to the [facility] after your appointment today for a BMP lab draw.   Follow-Up: At [facility], you and your health needs are our priority.  As part of our continuing mission to provide you with exceptional heart care, we have created designated Provider Care Teams.  These Care Teams include your primary Cardiologist (physician) and Advanced Practice Providers (APPs -  Physician Assistants and Nurse Practitioners) who all work together to provide you with the care you need, when you need it.  We recommend signing up for the patient portal called "MyChart".  Sign up information is provided on this After Visit Summary.  MyChart is used to connect with patients for Virtual Visits (Telemedicine).  Patients are able to view lab/test results, encounter notes, upcoming appointments, etc.  Non-urgent messages can be sent to your provider as well.   To learn more about what you can do with MyChart, go to [email or web address].    Your next appointment:   5 month(s)  The format for your next appointment:   In Person  Provider:   You may see Kate Sable, MD or one of the following Advanced Practice Providers on your designated Care Team:   Murray Hodgkins, NP Christell Faith, PA-C Cadence Kathlen Mody, PA-C Gerrie Nordmann, NP    Other Instructions   Schedule follow up with Dr. Quentin Ore  Important Information About Sugar

## 2022-09-14 NOTE — Progress Notes (Signed)
Cardiology Office Note:    Date:  09/14/2022   ID:  Alexis Lyons, DOB 1941/07/19, MRN [id number]  PCP:  Virginia Crews, MD   [place]  Cardiologist:  Kate Sable, MD  Advanced Practice Provider:  No care team member to display Electrophysiologist:  Vickie Epley, MD       Referring MD: Virginia Crews, MD   Chief Complaint  Patient presents with   Follow-up    Hospitol follow up, had episode of low pressure, medics were called      History of Present Illness:    Alexis Lyons is a 81 y.o. female with a hx of paroxysmal atrial fibrillation s/p RFA 10/2021, hypertension, who presents for follow-up.  Seen in the hospital last month for a syncopal episode.  Had 2 alcoholic drinks, started feeling dizzy when she returned home.  Tried to get up, felt dizzy and passed out.  Work-up in the hospital was unrevealing.  Cardiac monitor paroxysmal SVTs, PACs.  Started on Toprol-XL 12.5 mg daily.  She took Toprol XL yesterday which she states made her feel better, blood pressure was normal at home.  Took another dose of Toprol-XL today.  She takes Eliquis 2.5 mg twice daily for stroke prophylaxis.  She has not had any episode of syncope since.  Prior notes Echocardiogram 04/2021 EF 60 to 65%, impaired relaxation.   Past Medical History:  Diagnosis Date   Amnesia    Aphthae    Atrial fibrillation ([place])    Congestion-fibrosis syndrome    DD (diverticular disease)    GERD (gastroesophageal reflux disease)    Seizures ([place]) 12/2017   passed out due to dehydration    Past Surgical History:  Procedure Laterality Date   ATRIAL FIBRILLATION ABLATION N/A 11/02/2021   Procedure: [place];  Surgeon: Vickie Epley, MD;  Location: [facility];  Service: Cardiovascular;  Laterality: N/A;   CHOLECYSTECTOMY     colonoscopy with polypectomy     8/11 4 mm polyp, 12 mm polyp   COLONOSCOPY WITH PROPOFOL N/A  02/24/2018   Procedure: COLONOSCOPY WITH PROPOFOL;  Surgeon: Manya Silvas, MD;  [facility];  Service: Endoscopy;  Laterality: N/A;   HERNIA REPAIR Right 2004   inguinal    HERNIA REPAIR     x's 3    Current Medications: Current Meds  Medication Sig   Ginseng 100 MG CAPS Take 100 mg by mouth 4 (four) times a week.   potassium chloride (KLOR-CON M) 10 MEQ tablet Take 1 tablet (10 mEq total) by mouth daily for 5 days.   [DISCONTINUED] apixaban (ELIQUIS) 2.5 MG TABS tablet Take 1 tablet (2.5 mg total) by mouth 2 (two) times daily.   [DISCONTINUED] metoprolol succinate (TOPROL XL) 25 MG 24 hr tablet Take 0.5 tablets (12.5 mg total) by mouth daily.     Allergies:   Codeine   Social History   Socioeconomic History   Marital status: Divorced    Spouse name: Not on file   Number of children: 2   Years of education: 15   Highest education level: Not on file  Occupational History   Occupation: [employer] in [place] Use   Smoking status: Former    Packs/day: 0.50    Years: 15.00    Total pack years: 7.50    Types: Cigarettes    Quit date: 12/16/1985    Years since quitting: 36.7   Smokeless tobacco: Never  Vaping Use   Vaping Use: Never used  Substance and Sexual Activity   Alcohol use: Yes    Alcohol/week: 7.0 standard drinks of alcohol    Types: 7 Glasses of wine per week    Comment: glass of wine with dinner   Drug use: No   Sexual activity: Not Currently  Other Topics Concern   Not on file  Social History Narrative   Not on file   Social Determinants of Health   Financial Resource Strain: Not on file  Food Insecurity: Not on file  Transportation Needs: Not on file  Physical Activity: Not on file  Stress: Not on file  Social Connections: Not on file     Family History: The patient's family history includes Alcohol abuse in her father; Alzheimer's disease in her mother; Breast cancer in her sister; Cancer in her father and sister.  ROS:    Please see the history of present illness.     All other systems reviewed and are negative.  EKGs/Labs/Other Studies Reviewed:    The following studies were reviewed today:   EKG:  EKG is ordered today.  EKG shows sinus bradycardia with sinus arrhythmia.  Recent Labs: 08/15/2022: B Natriuretic Peptide 93.2 08/16/2022: ALT 48; Hemoglobin 12.9; Magnesium 1.9; Platelets 205; TSH 2.572 09/14/2022: BUN 15; Creatinine, Ser 0.69; Potassium 3.9; Sodium 142  Recent Lipid Panel    Component Value Date/Time   CHOL 171 08/15/2022 1328   TRIG 114 08/15/2022 1328   HDL 66 08/15/2022 1328   CHOLHDL 2.6 08/15/2022 1328   LDLCALC 85 08/15/2022 1328     Risk Assessment/Calculations:      Physical Exam:    VS:  BP (!) 138/90 (BP Location: Left Arm)   Pulse (!) 57   Ht 5' 3.5" (1.613 m)   Wt 118 lb 9.6 oz (53.8 kg)   SpO2 96%   BMI 20.68 kg/m     Wt Readings from Last 3 Encounters:  09/14/22 118 lb 9.6 oz (53.8 kg)  08/16/22 117 lb 8 oz (53.3 kg)  08/15/22 117 lb (53.1 kg)     GEN:  Well nourished, well developed in no acute distress HEENT: Normal NECK: No JVD; No carotid bruits CARDIAC: RRR, no murmurs, rubs, gallops RESPIRATORY:  Clear to auscultation without rales, wheezing or rhonchi  ABDOMEN: Soft, non-tender, non-distended MUSCULOSKELETAL:  No edema; No deformity  SKIN: Warm and dry NEUROLOGIC:  Alert and oriented x 3 PSYCHIATRIC:  Normal affect   ASSESSMENT:    1. Paroxysmal A-fib ([place])   2. Primary hypertension    PLAN:    In order of problems listed above:  paroxysmal atrial fibrillation, s/p RFA 10/2021.  EKG shows sinus bradycardia heart rate 57.  Monitor with paroxysmal SVT, no A-fib.  CHA2DS2-VASc of 4 (age, htn, gender).  Continue Toprol-XL 12.5 mg daily, Eliquis 2.5 mg twice daily. Hypertension, BP controlled.  Continue Toprol-XL 12.5 mg daily.    Follow-up in 5 months.   Medication Adjustments/Labs and Tests Ordered: Current medicines are reviewed at  length with the patient today.  Concerns regarding medicines are outlined above.  Orders Placed This Encounter  Procedures   Basic metabolic panel   EKG 36-RWER    Meds ordered this encounter  Medications   apixaban (ELIQUIS) 2.5 MG TABS tablet    Sig: Take 1 tablet (2.5 mg total) by mouth 2 (two) times daily.    Dispense:  60 tablet    Refill:  5   metoprolol succinate (TOPROL XL)  25 MG 24 hr tablet    Sig: Take 0.5 tablets (12.5 mg total) by mouth daily.    Dispense:  45 tablet    Refill:  1     Patient Instructions  Medication Instructions:   Your physician recommends that you continue on your current medications as directed. Please refer to the Current Medication list given to you today.   *If you need a refill on your cardiac medications before your next appointment, please call your pharmacy*  Lab Work:  Please go to the [facility] after your appointment today for a BMP lab draw.   Follow-Up: At [facility], you and your health needs are our priority.  As part of our continuing mission to provide you with exceptional heart care, we have created designated Provider Care Teams.  These Care Teams include your primary Cardiologist (physician) and Advanced Practice Providers (APPs -  Physician Assistants and Nurse Practitioners) who all work together to provide you with the care you need, when you need it.  We recommend signing up for the patient portal called "MyChart".  Sign up information is provided on this After Visit Summary.  MyChart is used to connect with patients for Virtual Visits (Telemedicine).  Patients are able to view lab/test results, encounter notes, upcoming appointments, etc.  Non-urgent messages can be sent to your provider as well.   To learn more about what you can do with MyChart, go to [email or web address].    Your next appointment:   5 month(s)  The format for your next appointment:   In Person  Provider:   You may see Kate Sable, MD or one of the following Advanced Practice Providers on your designated Care Team:   Murray Hodgkins, NP Christell Faith, PA-C Cadence Kathlen Mody, PA-C Gerrie Nordmann, NP    Other Instructions   Schedule follow up with Dr. Quentin Ore  Important Information About Sugar         Signed, Kate Sable, MD  09/14/2022 4:43 PM    [place]

## 2022-09-18 ENCOUNTER — Telehealth: Payer: Self-pay | Admitting: Cardiology

## 2022-09-18 ENCOUNTER — Ambulatory Visit: Payer: PPO | Admitting: Cardiology

## 2022-09-18 MED ORDER — METOPROLOL SUCCINATE ER 25 MG PO TB24: 12.5000 mg | ORAL_TABLET | Freq: Every day | ORAL | 1 refills | Status: DC

## 2022-09-18 NOTE — Telephone Encounter (Signed)
Pt c/o medication issue:  1. Name of Medication:  apixaban (ELIQUIS) 2.5 MG TABS tablet  2. How are you currently taking this medication (dosage and times per day)?   3. Are you having a reaction (difficulty breathing--STAT)?   4. What is your medication issue?   Patient states her insurance advised her that she is in the donut hole and Eliquis went up from $41 to $135. She would like to discuss less expensive alternatives.   Patient is also following up regarding Metoprolol request as she is going out of town tomorrow, see previous encounter.

## 2022-09-18 NOTE — Telephone Encounter (Signed)
Pt c/o medication issue:  1. Name of Medication: metoprolol succinate (TOPROL XL) 25 MG 24 hr tablet  2. How are you currently taking this medication (dosage and times per day)? Take 0.5 tablets (12.5 mg total) by mouth daily  3. Are you having a reaction (difficulty breathing--STAT)? no  4. What is your medication issue? This medication was to be sent to [id number] [place] [place] but it wasn't.  If it can please be sent as patient it leaving out of town tomorrow.

## 2022-09-18 NOTE — Telephone Encounter (Signed)
Duplicate encounter. Closing. See other tele encounter from today.

## 2022-09-18 NOTE — Telephone Encounter (Signed)
Called patient and left a detailed VM per DPR on file. Informed her that the Metoprolol has been sent in. Advised that I left her 3 weeks of samples for her Eliquis 2.5 MG at our front desk and requested she call back to discuss other options.

## 2022-09-28 NOTE — Progress Notes (Unsigned)
I,Harmonii Karle S Loi Rennaker,acting as a Education administrator for Lavon Paganini, MD.,have documented all relevant documentation on the behalf of Lavon Paganini, MD,as directed by  Lavon Paganini, MD while in the presence of Lavon Paganini, MD.     Established patient visit   Patient: Alexis Lyons   DOB: 1941/02/08   81 y.o. Female  MRN: [id number] Visit Date: 10/01/2022  Today's healthcare provider: Lavon Paganini, MD   Chief Complaint  Patient presents with   Hypertension   Subjective    HPI  Hypertension, follow-up  BP Readings from Last 3 Encounters:  10/01/22 125/80  09/14/22 (!) 138/90  08/16/22 130/80   Wt Readings from Last 3 Encounters:  10/01/22 120 lb 3.2 oz (54.5 kg)  09/14/22 118 lb 9.6 oz (53.8 kg)  08/16/22 117 lb 8 oz (53.3 kg)     She was last seen for hypertension 1 months ago.  BP at that visit was 146/89. Management since that visit includes no changes.  She reports excellent compliance with treatment. She is not having side effects.  She is following a Regular diet. She is exercising. She does not smoke.  Use of agents associated with hypertension: none.   Outside blood pressures are stable. Symptoms: No chest pain No chest pressure  No palpitations No syncope  No dyspnea No orthopnea  No paroxysmal nocturnal dyspnea No lower extremity edema   Pertinent labs Lab Results  Component Value Date   CHOL 171 08/15/2022   HDL 66 08/15/2022   LDLCALC 85 08/15/2022   TRIG 114 08/15/2022   CHOLHDL 2.6 08/15/2022   Lab Results  Component Value Date   NA 142 09/14/2022   K 3.9 09/14/2022   CREATININE 0.69 09/14/2022   GFRNONAA >60 09/14/2022   GLUCOSE 104 (H) 09/14/2022   TSH 2.572 08/16/2022     The ASCVD Risk score (Arnett DK, et al., 2019) failed to calculate for the following reasons:   The 2019 ASCVD risk score is only valid for ages 64 to  22  ---------------------------------------------------------------------------------------------------   Medications: Outpatient Medications Prior to Visit  Medication Sig   apixaban (ELIQUIS) 2.5 MG TABS tablet Take 1 tablet (2.5 mg total) by mouth 2 (two) times daily.   Ginseng 100 MG CAPS Take 100 mg by mouth 4 (four) times a week.   metoprolol succinate (TOPROL XL) 25 MG 24 hr tablet Take 0.5 tablets (12.5 mg total) by mouth daily.   [DISCONTINUED] potassium chloride (KLOR-CON M) 10 MEQ tablet Take 1 tablet (10 mEq total) by mouth daily for 5 days.   No facility-administered medications prior to visit.    Review of Systems  Constitutional:  Negative for appetite change and fatigue.  Respiratory:  Negative for chest tightness and shortness of breath.   Cardiovascular:  Negative for chest pain, palpitations and leg swelling.  Gastrointestinal:  Negative for abdominal pain, diarrhea, nausea and vomiting.       Objective    BP 125/80 (BP Location: Left Arm, Patient Position: Sitting, Cuff Size: Normal)   Pulse (!) 48   Temp 98 F (36.7 C) (Oral)   Resp 16   Wt 120 lb 3.2 oz (54.5 kg)   BMI 20.96 kg/m  BP Readings from Last 3 Encounters:  10/01/22 125/80  09/14/22 (!) 138/90  08/16/22 130/80   Wt Readings from Last 3 Encounters:  10/01/22 120 lb 3.2 oz (54.5 kg)  09/14/22 118 lb 9.6 oz (53.8 kg)  08/16/22 117 lb 8 oz (53.3 kg)  Physical Exam Vitals reviewed.  Constitutional:      General: She is not in acute distress.    Appearance: Normal appearance. She is well-developed. She is not diaphoretic.  HENT:     Head: Normocephalic and atraumatic.  Eyes:     General: No scleral icterus.    Conjunctiva/sclera: Conjunctivae normal.  Neck:     Thyroid: No thyromegaly.  Cardiovascular:     Rate and Rhythm: Normal rate and regular rhythm.     Pulses: Normal pulses.     Heart sounds: Normal heart sounds. No murmur heard. Pulmonary:     Effort: Pulmonary effort  is normal. No respiratory distress.     Breath sounds: Normal breath sounds. No wheezing, rhonchi or rales.  Musculoskeletal:     Cervical back: Neck supple.     Right lower leg: No edema.     Left lower leg: No edema.  Lymphadenopathy:     Cervical: No cervical adenopathy.  Skin:    General: Skin is warm and dry.  Neurological:     Mental Status: She is alert and oriented to person, place, and time. Mental status is at baseline.  Psychiatric:        Mood and Affect: Mood normal.        Behavior: Behavior normal.       No results found for any visits on 10/01/22.  Assessment & Plan     Problem List Items Addressed This Visit       Cardiovascular and Mediastinum   Essential hypertension - Primary    Well controlled Continue current medications Reviewed recent metabolic panel      Paroxysmal atrial fibrillation (HCC)    conitnue low dose eliquis F/b cardiology No rapid rate today Continue metop at current dose      Senile purpura ([place])    Stable Continue to monitor        Return in about 6 months (around 04/02/2023) for AWV, CPE.      I, Lavon Paganini, MD, have reviewed all documentation for this visit. The documentation on 10/01/22 for the exam, diagnosis, procedures, and orders are all accurate and complete.   Bacigalupo, Dionne Bucy, MD, MPH [facility]

## 2022-10-01 ENCOUNTER — Ambulatory Visit (INDEPENDENT_AMBULATORY_CARE_PROVIDER_SITE_OTHER): Payer: PPO | Admitting: Family Medicine

## 2022-10-01 ENCOUNTER — Encounter: Payer: Self-pay | Admitting: Family Medicine

## 2022-10-01 VITALS — BP 125/80 | HR 48 | Temp 98.0°F | Resp 16 | Wt 120.2 lb

## 2022-10-01 DIAGNOSIS — I480 Paroxysmal atrial fibrillation: Secondary | ICD-10-CM | POA: Diagnosis not present

## 2022-10-01 DIAGNOSIS — I10 Essential (primary) hypertension: Secondary | ICD-10-CM

## 2022-10-01 DIAGNOSIS — D692 Other nonthrombocytopenic purpura: Secondary | ICD-10-CM | POA: Diagnosis not present

## 2022-10-01 NOTE — Assessment & Plan Note (Signed)
Well controlled Continue current medications Reviewed recent metabolic panel 

## 2022-10-01 NOTE — Assessment & Plan Note (Signed)
Stable.       - Continue to monitor

## 2022-10-01 NOTE — Assessment & Plan Note (Signed)
conitnue low dose eliquis F/b cardiology No rapid rate today Continue metop at current dose

## 2022-10-04 ENCOUNTER — Telehealth: Payer: Self-pay | Admitting: Cardiology

## 2022-10-04 NOTE — Telephone Encounter (Signed)
Pt c/o medication issue:  1. Name of Medication: Metoprolol 12.5 mg   2. How are you currently taking this medication (dosage and times per day)? 1 times a day  3. Are you having a reaction (difficulty breathing--STAT)? no  4. What is your medication issue? Lightheaded, drowsy, tired and no energy- sitting in her car, do not have the energy to go in the store

## 2022-10-04 NOTE — Telephone Encounter (Signed)
Spoke with patient of Dr. Kate Sable. She has felt unwell since starting meto succ a few weeks ago. She reports lightheaded, drowsy, fatigue. She thinks she took metoprolol in the past when she first had afib and did NOT do well on this. Per review of chart, saw Dr. Lurline Hare 9/28 and it was in the note she took dilt + meto at same time w/symptoms and dilt was stopped. She is asking if she can stop med. Advised will need for MD to review.  Vitals:  10/19 - while on the phone BP 147/102 HR 68  10/18 BP 147/93  HR 55  Date unknown: BP 125/86 HR 65

## 2022-10-05 NOTE — Telephone Encounter (Signed)
Patient informed and verbalized understanding of plan. 

## 2022-10-05 NOTE — Telephone Encounter (Signed)
Kate Sable, MD  Fidel Levy, RN Cc: Kavin Leech, RN Caller: Unspecified (Yesterday,  1:43 PM) Stop Toprol-XL due to symptoms of fatigue.  Monitor symptoms for any improvement.

## 2022-10-17 ENCOUNTER — Encounter: Payer: Self-pay | Admitting: Cardiology

## 2022-10-17 ENCOUNTER — Ambulatory Visit: Payer: PPO | Attending: Cardiology | Admitting: Cardiology

## 2022-10-17 VITALS — BP 138/86 | HR 61 | Ht 63.0 in | Wt 120.0 lb

## 2022-10-17 DIAGNOSIS — I10 Essential (primary) hypertension: Secondary | ICD-10-CM | POA: Diagnosis not present

## 2022-10-17 DIAGNOSIS — I480 Paroxysmal atrial fibrillation: Secondary | ICD-10-CM

## 2022-10-17 DIAGNOSIS — R55 Syncope and collapse: Secondary | ICD-10-CM

## 2022-10-17 NOTE — Progress Notes (Signed)
Electrophysiology Office Follow up Visit Note:    Date:  10/17/2022   ID:  Alexis Lyons, DOB 1941-11-23, MRN [id number]  PCP:  Virginia Crews, MD  [employer] Cardiologist:  Kate Sable, MD  [facility] Electrophysiologist:  Vickie Epley, MD    Interval History:    Alexis Lyons is a 81 y.o. female who presents for a follow up visit.  She had an A-fib ablation November 02, 2021.  During the ablation, the pulmonary veins were isolated. She saw Dr. Garen Lah in follow-up on September 14, 2022.  This was after a dizziness spell with syncope resulting in hospitalization in early September 2023.  Work-up in the hospital was only significant for paroxysmal SVTs and PACs.  Metoprolol succinate was started at the time of the appointment with Dr. Garen Lah she has not had a recurrence of syncope.  She was still taking Eliquis twice daily for stroke prophylaxis.  She wore a 2-week heart monitor in September 2023 that showed no evidence of atrial fibrillation but did show frequent atrial tachycardias, longest lasting 6 minutes with a rate of 133 bpm.  There were occasional supraventricular ectopics (2.1%).  Today she is doing okay.  She feels better after stopping hydrochlorothiazide and metoprolol.  She had presented several times emergency department for lightheadedness/syncope.  Again, the symptoms improved with stopping the metoprolol and hydrochlorothiazide.  When she was visiting [place] where she is from she struck her head and presented to the ER for a head CT given concerns of head bleed with the Eliquis.  She is recently returned from a river cruise.  She inquires about watchman today.     Past Medical History:  Diagnosis Date   Amnesia    Aphthae    Atrial fibrillation ([place])    Congestion-fibrosis syndrome    DD (diverticular disease)    GERD (gastroesophageal reflux disease)    Seizures ([place]) 12/2017   passed out due to dehydration    Past  Surgical History:  Procedure Laterality Date   ATRIAL FIBRILLATION ABLATION N/A 11/02/2021   Procedure: [place];  Surgeon: Vickie Epley, MD;  Location: [facility];  Service: Cardiovascular;  Laterality: N/A;   CHOLECYSTECTOMY     colonoscopy with polypectomy     8/11 4 mm polyp, 12 mm polyp   COLONOSCOPY WITH PROPOFOL N/A 02/24/2018   Procedure: COLONOSCOPY WITH PROPOFOL;  Surgeon: Manya Silvas, MD;  Location: [facility] ENDOSCOPY;  Service: Endoscopy;  Laterality: N/A;   HERNIA REPAIR Right 2004   inguinal    HERNIA REPAIR     x's 3    Current Medications: Current Meds  Medication Sig   apixaban (ELIQUIS) 2.5 MG TABS tablet Take 1 tablet (2.5 mg total) by mouth 2 (two) times daily.   Cyanocobalamin (B-12 PO) Take by mouth daily.   Ginseng 100 MG CAPS Take 100 mg by mouth 4 (four) times a week.     Allergies:   Codeine   Social History   Socioeconomic History   Marital status: Divorced    Spouse name: Not on file   Number of children: 2   Years of education: 15   Highest education level: Not on file  Occupational History   Occupation: [employer] in [place] Use   Smoking status: Former    Packs/day: 0.50    Years: 15.00    Total pack years: 7.50    Types: Cigarettes    Quit date: 12/16/1985  Years since quitting: 36.8   Smokeless tobacco: Never  Vaping Use   Vaping Use: Never used  Substance and Sexual Activity   Alcohol use: Yes    Alcohol/week: 7.0 standard drinks of alcohol    Types: 7 Glasses of wine per week    Comment: glass of wine with dinner   Drug use: No   Sexual activity: Not Currently  Other Topics Concern   Not on file  Social History Narrative   Not on file   Social Determinants of Health   Financial Resource Strain: Not on file  Food Insecurity: Not on file  Transportation Needs: Not on file  Physical Activity: Not on file  Stress: Not on file  Social Connections: Not on file     Family  History: The patient's family history includes Alcohol abuse in her father; Alzheimer's disease in her mother; Breast cancer in her sister; Cancer in her father and sister.  ROS:   Please see the history of present illness.    All other systems reviewed and are negative.  EKGs/Labs/Other Studies Reviewed:    The following studies were reviewed today:   EKG:  The ekg ordered today demonstrates sinus rhythm/sinus arrhythmia  Recent Labs: 08/15/2022: B Natriuretic Peptide 93.2 08/16/2022: ALT 48; Hemoglobin 12.9; Magnesium 1.9; Platelets 205; TSH 2.572 09/14/2022: BUN 15; Creatinine, Ser 0.69; Potassium 3.9; Sodium 142  Recent Lipid Panel    Component Value Date/Time   CHOL 171 08/15/2022 1328   TRIG 114 08/15/2022 1328   HDL 66 08/15/2022 1328   CHOLHDL 2.6 08/15/2022 1328   LDLCALC 85 08/15/2022 1328    Physical Exam:    VS:  BP 138/86   Pulse 61   Ht '5\' 3"'$  (1.6 m)   Wt 120 lb (54.4 kg)   BMI 21.26 kg/m     Wt Readings from Last 3 Encounters:  10/17/22 120 lb (54.4 kg)  10/01/22 120 lb 3.2 oz (54.5 kg)  09/14/22 118 lb 9.6 oz (53.8 kg)     GEN:  Well nourished, well developed in no acute distress.  Appears younger than stated age 30: Normal NECK: No JVD; No carotid bruits LYMPHATICS: No lymphadenopathy CARDIAC: RRR, no murmurs, rubs, gallops RESPIRATORY:  Clear to auscultation without rales, wheezing or rhonchi  ABDOMEN: Soft, non-tender, non-distended MUSCULOSKELETAL:  No edema; No deformity  SKIN: Warm and dry NEUROLOGIC:  Alert and oriented x 3 PSYCHIATRIC:  Normal affect        ASSESSMENT:    1. Paroxysmal atrial fibrillation (HCC)   2. Primary hypertension   3. Syncope and collapse    PLAN:    In order of problems listed above:   #Paroxysmal atrial fibrillation No recurrence after her 2022 ablation.  Does still have frequent supraventricular ectopy and salvos of atrial tachycardia.  For this she has previously taken metoprolol but this was  stopped after a syncopal episode and feelings of fatigue. Continue Eliquis 2.5 mg by mouth twice daily.  She does ask about the possibility of stopping her blood thinner.  Her CHA2DS2-VASc is at least 3 given age, gender, hypertension.  We discussed the associated stroke risk.  We discussed loop recorder monitoring as 1 strategy to avoid long-term exposure anticoagulation.  We also discussed watchman as a mechanism to avoid long-term exposure to anticoagulation.  She will think about her options including watchman and let us know if she would like to proceed.  I briefly reviewed her CT scan and her left atrial appendage anatomy appears  suitable for watchman implant.  Would have to more formally review this prior to scheduling procedure.  For now, she will continue Eliquis 2.5 mg by mouth twice daily.  #Hypertension At goal today now off medicines.  Recommend checking blood pressures 1-2 times per week at home and recording the values.  Recommend bringing these recordings to the primary care physician.  #Syncope I suspect this was related to medications including metoprolol and hydrochlorothiazide.  She will avoid these medicines.  She will let us know if she has any recurrent episode.  Does not appear to be arrhythmic.    Medication Adjustments/Labs and Tests Ordered: Current medicines are reviewed at length with the patient today.  Concerns regarding medicines are outlined above.  No orders of the defined types were placed in this encounter.  No orders of the defined types were placed in this encounter.    Signed, Lars Mage, MD, [facility], [facility] 10/17/2022 2:13 PM    Electrophysiology [facility]

## 2022-10-17 NOTE — Patient Instructions (Addendum)
Medication Instructions:  none *If you need a refill on your cardiac medications before your next appointment, please call your pharmacy*   Lab Work: none If you have labs (blood work) drawn today and your tests are completely normal, you will receive your results only by: [place] (if you have MyChart) OR A paper copy in the mail If you have any lab test that is abnormal or we need to change your treatment, we will call you to review the results.   Testing/Procedures: Your physician has requested that you have Left atrial appendage (LAA) closure device implantation is a procedure to put a small device in the LAA of the heart. The LAA is a small sac in the wall of the heart's left upper chamber. Blood clots can form in this area. The device, Watchman closes the LAA to help prevent a blood clot and stroke.    Follow-Up: At [facility], you and your health needs are our priority.  As part of our continuing mission to provide you with exceptional heart care, we have created designated Provider Care Teams.  These Care Teams include your primary Cardiologist (physician) and Advanced Practice Providers (APPs -  Physician Assistants and Nurse Practitioners) who all work together to provide you with the care you need, when you need it.  We recommend signing up for the patient portal called "MyChart".  Sign up information is provided on this After Visit Summary.  MyChart is used to connect with patients for Virtual Visits (Telemedicine).  Patients are able to view lab/test results, encounter notes, upcoming appointments, etc.  Non-urgent messages can be sent to your provider as well.   To learn more about what you can do with MyChart, go to [email or web address].    Your next appointment:   Call the office if you wish to move forward with the Watchman device or 1 year(s)  The format for your next appointment:   In Person  Provider:   You will see one of the following Advanced  Practice Providers on your designated Care Team:   Murray Hodgkins, NP Christell Faith, PA-C Cadence Kathlen Mody, PA-C Gerrie Nordmann, NP      Other Instructions none  Important Information About [place]

## 2022-11-21 IMAGING — CR DG KNEE COMPLETE 4+V*L*
1 series · 4 of 4 positions shown · non-contrast
Comparison: None.

CLINICAL DATA: Left medial knee swelling, anteromedial knee pain
since last night. No known injury.

EXAM:
LEFT KNEE - COMPLETE 4+ VIEW

[Series 1: dg knee complete 4 views left · 0.14mm/px · 4 of 4 slices shown]
[im 1/4]
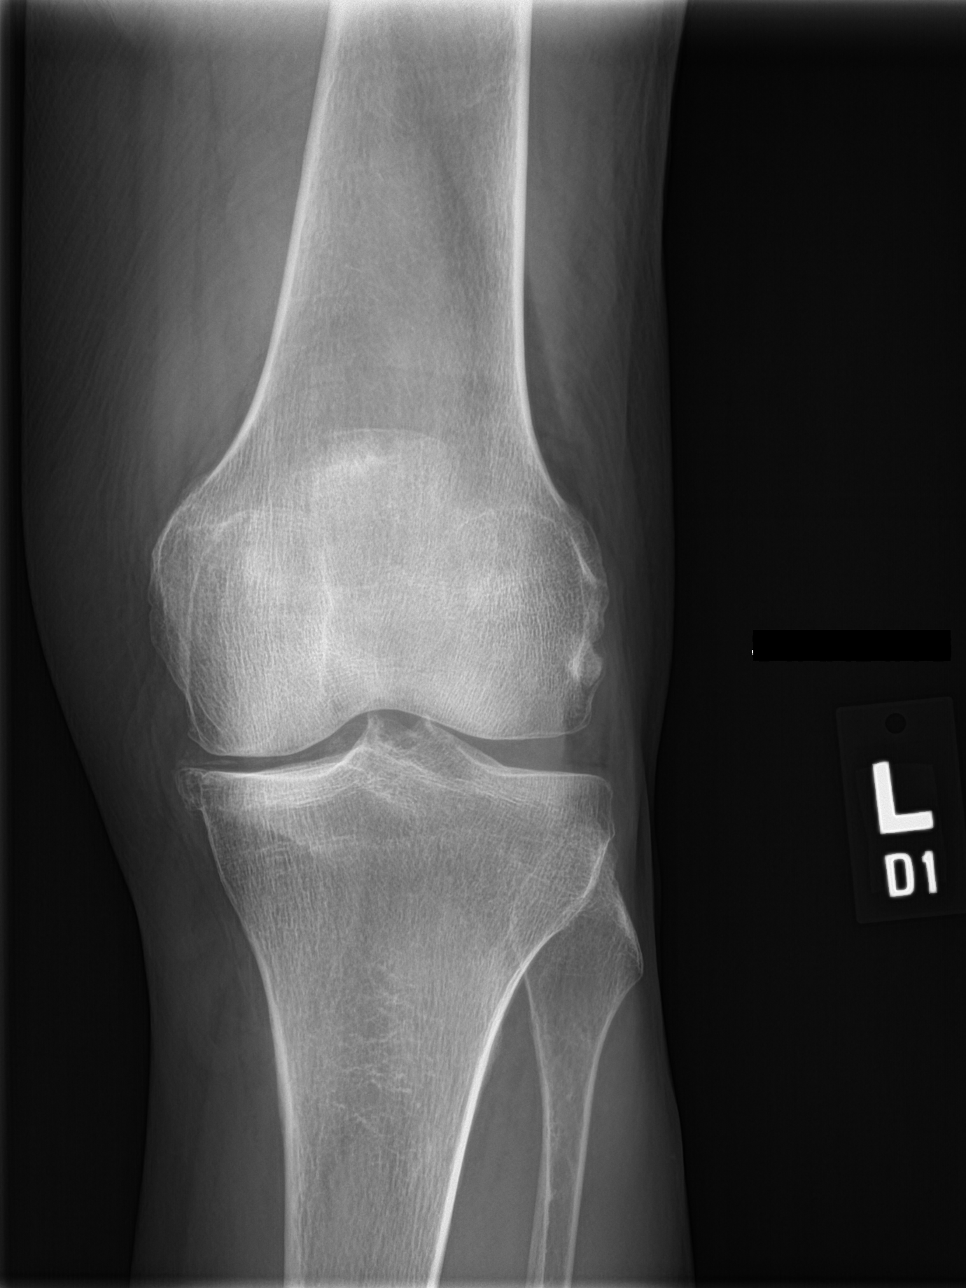
[im 2/4]
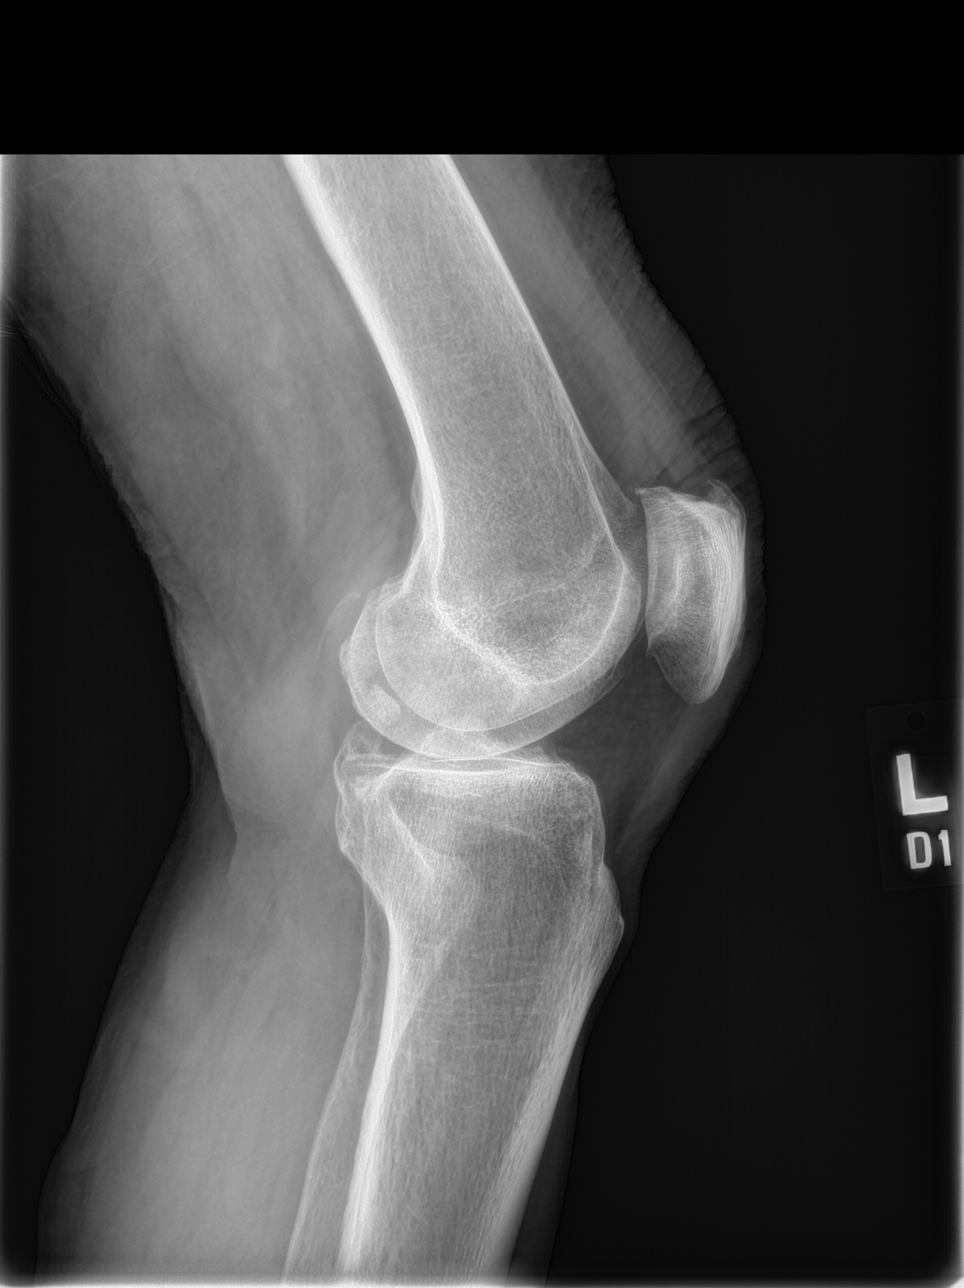
[im 3/4]
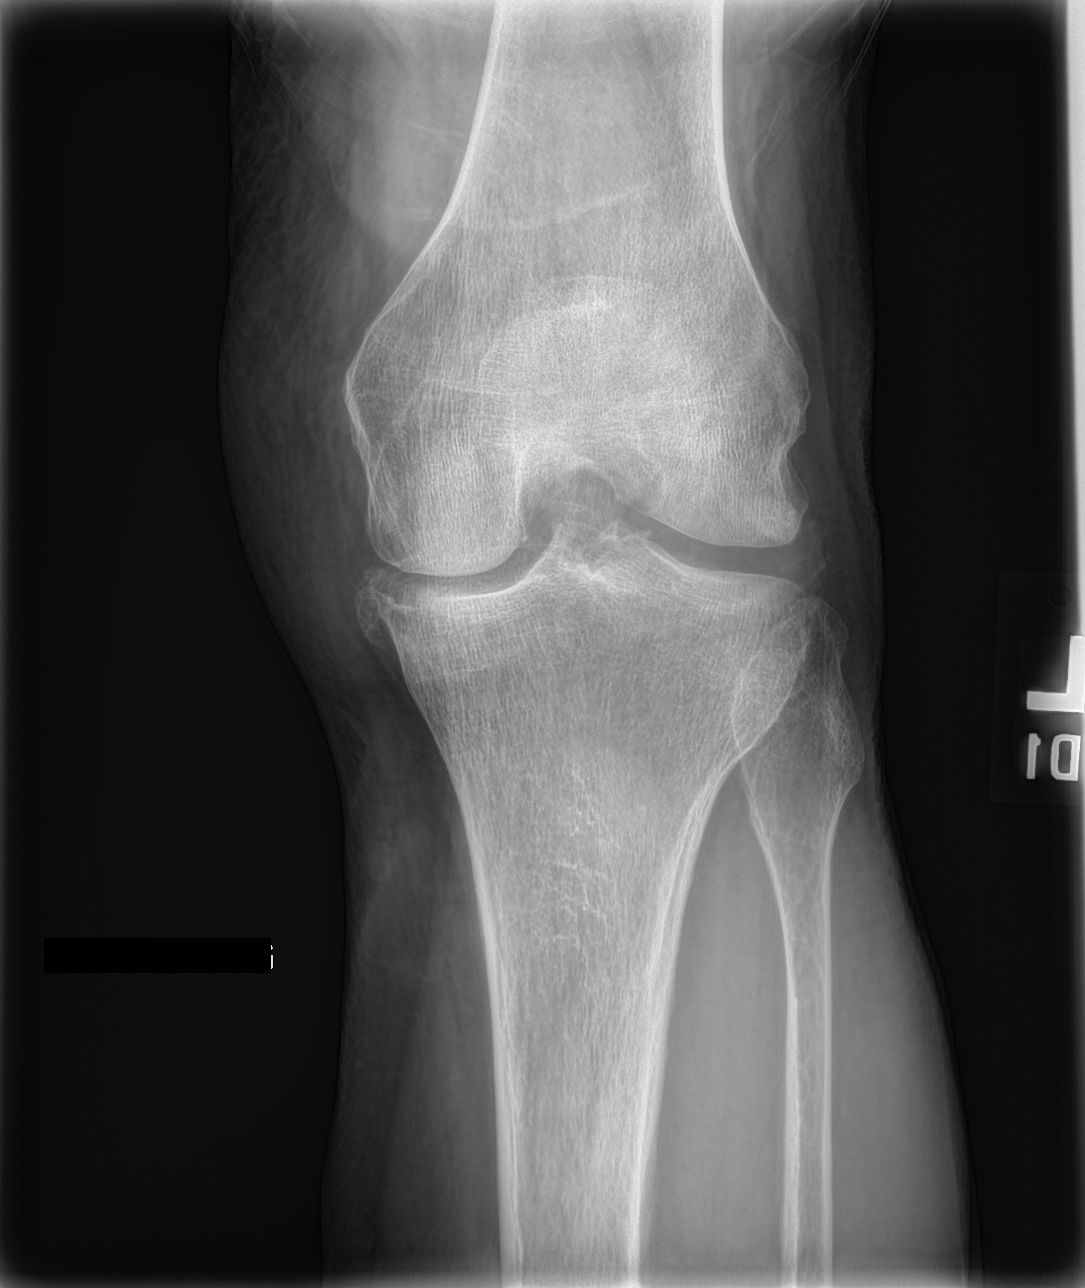
[im 4/4]
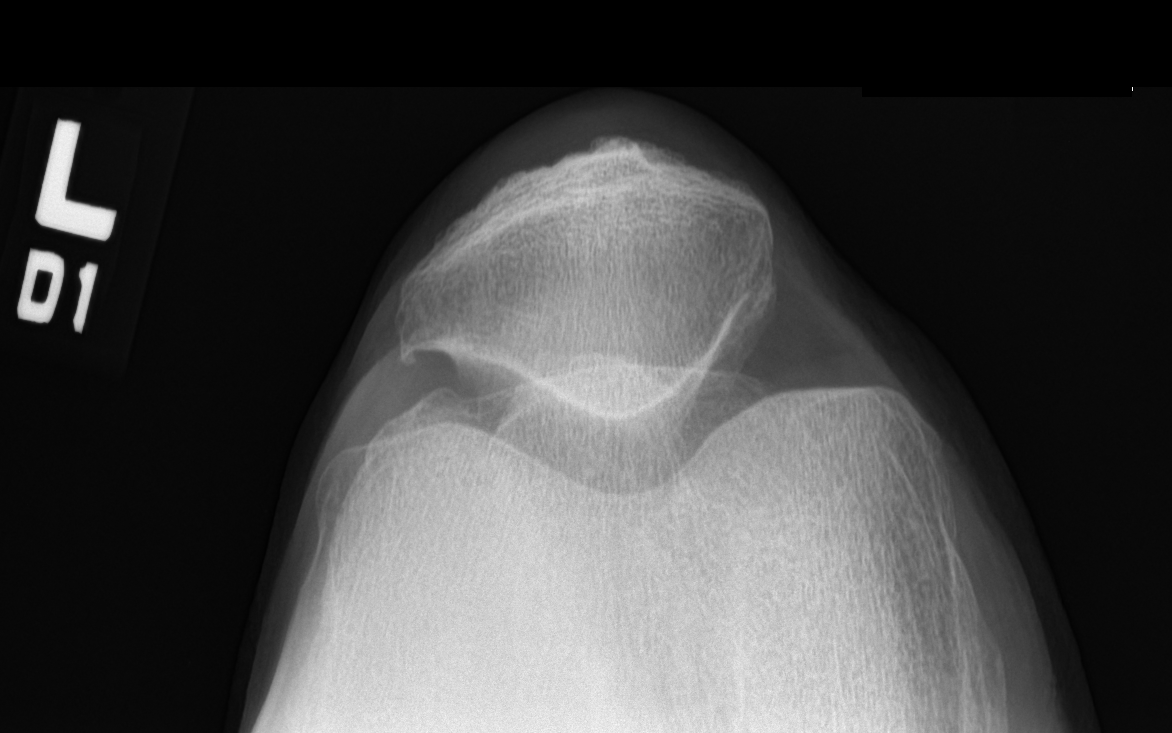

[4 of 4 positions shown; findings below may reference images not displayed]

FINDINGS: Moderate medial joint space narrowing and associated spur formation.
Medial meniscus calcification. Mild patellofemoral spur formation.
Moderate superior patellar enthesophyte formation. No effusion,
fracture or dislocation.
IMPRESSION: 1. Moderate medial joint space degenerative changes and
chondrocalcinosis.
2. Mild patellofemoral degenerative changes.

## 2022-12-11 ENCOUNTER — Ambulatory Visit: Payer: Self-pay

## 2022-12-11 NOTE — Telephone Encounter (Signed)
Pt is calling to report that she tested positive for COVID on Saturday. Sx include fatigue, irration of the throat, body aches,and cold sweats, cough. Head Congestion. Please advise   Chief Complaint: COVID positive, sinus congestion, cough, chills Symptoms: Above Frequency: 3 days ago Pertinent Negatives: Patient denies SOB Disposition: '[]'$ ED /'[]'$ Urgent Care (no appt availability in office) / '[x]'$ Appointment(In office/virtual)/ '[]'$  [facility]/ '[]'$ Home Care/ '[]'$ Refused Recommended Disposition /'[]'$ [facility]/ '[]'$  Follow-up with PCP Additional Notes: Go to ED for worsening of symptoms.  Reason for Disposition  [1] Fever > 101 F (38.3 C) AND [2] age > 60 years  Answer Assessment - Initial Assessment Questions 1. COVID-19 DIAGNOSIS: "How do you know that you have COVID?" (e.g., positive lab test or self-test, diagnosed by doctor or NP/PA, symptoms after exposure).     Home 2. COVID-19 EXPOSURE: "Was there any known exposure to COVID before the symptoms began?" CDC Definition of close contact: within 6 feet (2 meters) for a total of 15 minutes or more over a 24-hour period.      N/a 3. ONSET: "When did the COVID-19 symptoms start?"      Saturday 4. WORST SYMPTOM: "What is your worst symptom?" (e.g., cough, fever, shortness of breath, muscle aches)     No 5. COUGH: "Do you have a cough?" If Yes, ask: "How bad is the cough?"       Yes 6. FEVER: "Do you have a fever?" If Yes, ask: "What is your temperature, how was it measured, and when did it start?"     Chills 7. RESPIRATORY STATUS: "Describe your breathing?" (e.g., normal; shortness of breath, wheezing, unable to speak)      No 8. BETTER-SAME-WORSE: "Are you getting better, staying the same or getting worse compared to yesterday?"  If getting worse, ask, "In what way?"     Same 9. OTHER SYMPTOMS: "Do you have any other symptoms?"  (e.g., chills, fatigue, headache, loss of smell or taste, muscle pain, sore throat)      Sinus 10. HIGH RISK DISEASE: "Do you have any chronic medical problems?" (e.g., asthma, heart or lung disease, weak immune system, obesity, etc.)       Yes 11. VACCINE: "Have you had the COVID-19 vaccine?" If Yes, ask: "Which one, how many shots, when did you get it?"       N/a 12. PREGNANCY: "Is there any chance you are pregnant?" "When was your last menstrual period?"       No 13. O2 SATURATION MONITOR:  "Do you use an oxygen saturation monitor (pulse oximeter) at home?" If Yes, ask "What is your reading (oxygen level) today?" "What is your usual oxygen saturation reading?" (e.g., 95%)       No  Protocols used: Coronavirus (COVID-19) Diagnosed or Suspected-A-AH

## 2022-12-12 ENCOUNTER — Ambulatory Visit (INDEPENDENT_AMBULATORY_CARE_PROVIDER_SITE_OTHER): Payer: PPO | Admitting: Family Medicine

## 2022-12-12 ENCOUNTER — Encounter: Payer: Self-pay | Admitting: Family Medicine

## 2022-12-12 VITALS — BP 134/87 | HR 79 | Temp 98.3°F | Wt 120.6 lb

## 2022-12-12 DIAGNOSIS — F5102 Adjustment insomnia: Secondary | ICD-10-CM | POA: Diagnosis not present

## 2022-12-12 DIAGNOSIS — U071 COVID-19: Secondary | ICD-10-CM | POA: Diagnosis not present

## 2022-12-12 HISTORY — DX: COVID-19: U07.1

## 2022-12-12 NOTE — Progress Notes (Signed)
I,Connie R Striblin,acting as a Education administrator for Gwyneth Sprout, FNP.,have documented all relevant documentation on the behalf of Gwyneth Sprout, FNP,as directed by  Gwyneth Sprout, FNP while in the presence of Gwyneth Sprout, FNP.   Established patient visit   Patient: Alexis Lyons   DOB: Apr 10, 1941   81 y.o. Female  MRN: [id number] Visit Date: 12/12/2022  Today's healthcare provider: Gwyneth Sprout, FNP  Introduced to nurse practitioner role and practice setting.  All questions answered.  Discussed provider/patient relationship and expectations.   Chief Complaint  Patient presents with   Covid Positive   Subjective    HPI  Cough: Patient complains of cough. Symptoms began 4 days ago. Cough described as non-productive. Patient denies fever. Associated symptoms include chills, nasal congestion, sore throat, and headache .Current treatments have included  OTC cough medication , with good improvement.    Pt tested for Covid on Saturday. Pt would like to  be prescribed Ambien   Medications: Outpatient Medications Prior to Visit  Medication Sig   apixaban (ELIQUIS) 2.5 MG TABS tablet Take 1 tablet (2.5 mg total) by mouth 2 (two) times daily.   Cyanocobalamin (B-12 PO) Take by mouth daily.   Ginseng 100 MG CAPS Take 100 mg by mouth 4 (four) times a week.   No facility-administered medications prior to visit.    Review of Systems    Objective    BP 134/87 (BP Location: Right Arm, Patient Position: Sitting, Cuff Size: Normal)   Pulse 79   Temp 98.3 F (36.8 C) (Oral)   Wt 120 lb 9.6 oz (54.7 kg)   SpO2 98%   BMI 21.36 kg/m   Physical Exam Constitutional:      General: She is not in acute distress.    Appearance: Normal appearance. She is normal weight. She is not ill-appearing, toxic-appearing or diaphoretic.  HENT:     Head: Normocephalic and atraumatic.     Right Ear: External ear normal.     Left Ear: External ear normal.     Nose: Nose normal.     Mouth/Throat:      Mouth: Mucous membranes are moist.     Pharynx: Oropharynx is clear. No posterior oropharyngeal erythema.  Eyes:     Conjunctiva/sclera: Conjunctivae normal.     Pupils: Pupils are equal, round, and reactive to light.  Cardiovascular:     Rate and Rhythm: Normal rate.     Pulses: Normal pulses.     Heart sounds: Normal heart sounds.  Pulmonary:     Effort: Pulmonary effort is normal.     Breath sounds: Normal breath sounds.  Musculoskeletal:     Cervical back: Normal range of motion.  Skin:    General: Skin is warm and dry.     Capillary Refill: Capillary refill takes less than 2 seconds.  Neurological:     General: No focal deficit present.     Mental Status: She is alert and oriented to person, place, and time. Mental status is at baseline.  Psychiatric:        Mood and Affect: Mood normal.        Behavior: Behavior normal.        Thought Content: Thought content normal.        Judgment: Judgment normal.     No results found for any visits on 12/12/22.  Assessment & Plan     Problem List Items Addressed This Visit  Other   COVID - Primary    Home tested 5 days ago; continue supportive care. Advised of CDC additional masking requirements for 5 more days Patient is frustrated she will miss her NYE plans Recommend use of Mucinex, Delsym, and oral anti-histamine to assist Return to office if viral symptoms linger and there is concern for an infection      Insomnia    Chronic, stable Pt request for additional ambien to assist Pt is aware of daily use and use concerns in setting of age >53; patient wishes to use the medication nonetheless       Return if symptoms worsen or fail to improve.    Vonna Kotyk, FNP, have reviewed all documentation for this visit. The documentation on 12/12/22 for the exam, diagnosis, procedures, and orders are all accurate and complete.  Gwyneth Sprout, [place] [phone] (phone) [phone]  (fax)  [place]

## 2022-12-12 NOTE — Assessment & Plan Note (Signed)
Home tested 5 days ago; continue supportive care. Advised of CDC additional masking requirements for 5 more days Patient is frustrated she will miss her NYE plans Recommend use of Mucinex, Delsym, and oral anti-histamine to assist Return to office if viral symptoms linger and there is concern for an infection

## 2022-12-12 NOTE — Assessment & Plan Note (Signed)
Chronic, stable Pt request for additional ambien to assist Pt is aware of daily use and use concerns in setting of age >46; patient wishes to use the medication nonetheless

## 2022-12-17 ENCOUNTER — Telehealth: Payer: Self-pay | Admitting: Family Medicine

## 2022-12-17 NOTE — Telephone Encounter (Signed)
Pt requested a new RX for Ambien

## 2022-12-18 MED ORDER — ZOLPIDEM TARTRATE 5 MG PO TABS: 2.5000 mg | ORAL_TABLET | Freq: Every evening | ORAL | 3 refills | Status: DC | PRN

## 2022-12-19 ENCOUNTER — Other Ambulatory Visit: Payer: Self-pay | Admitting: Family Medicine

## 2022-12-20 ENCOUNTER — Ambulatory Visit (INDEPENDENT_AMBULATORY_CARE_PROVIDER_SITE_OTHER): Payer: PPO | Admitting: Family Medicine

## 2022-12-20 ENCOUNTER — Encounter: Payer: Self-pay | Admitting: Family Medicine

## 2022-12-20 VITALS — BP 150/83 | HR 66 | Temp 98.4°F | Resp 16 | Wt 119.6 lb

## 2022-12-20 DIAGNOSIS — G4440 Drug-induced headache, not elsewhere classified, not intractable: Secondary | ICD-10-CM | POA: Diagnosis not present

## 2022-12-20 DIAGNOSIS — J3089 Other allergic rhinitis: Secondary | ICD-10-CM | POA: Diagnosis not present

## 2022-12-20 DIAGNOSIS — I10 Essential (primary) hypertension: Secondary | ICD-10-CM | POA: Diagnosis not present

## 2022-12-20 NOTE — Progress Notes (Incomplete)
    I,Sulibeya S Dimas,acting as a Education administrator for Lavon Paganini, MD.,have documented all relevant documentation on the behalf of Lavon Paganini, MD,as directed by  Lavon Paganini, MD while in the presence of Lavon Paganini, MD.    Established patient visit   Patient: Alexis Lyons   DOB: 1941-07-22   82 y.o. Female  MRN: [id number] Visit Date: 12/20/2022  Today's healthcare provider: Lavon Paganini, MD   Chief Complaint  Patient presents with  . URI   Subjective    HPI  Upper respiratory symptoms She complains of headache described as migraine, post nasal drip, sinus pressure, and fatigue .with no fever, chills, night sweats or weight loss. Onset of symptoms was a few days ago and gradually improving.She is drinking plenty of fluids.  Past history is significant for no history of pneumonia or bronchitis. Patient is non-smoker patient tested positive for covid over a week ago.  Feeling fatigued  ---------------------------------------------------------------------------------------------------   Medications: Outpatient Medications Prior to Visit  Medication Sig  . apixaban (ELIQUIS) 2.5 MG TABS tablet Take 1 tablet (2.5 mg total) by mouth 2 (two) times daily.  . Cyanocobalamin (B-12 PO) Take by mouth daily.  . Ginseng 100 MG CAPS Take 100 mg by mouth 4 (four) times a week.  . zolpidem (AMBIEN) 5 MG tablet Take 0.5 tablets (2.5 mg total) by mouth at bedtime as needed for sleep.   No facility-administered medications prior to visit.    Review of Systems  Constitutional:  Positive for fatigue. Negative for chills and fever.  HENT:  Positive for postnasal drip and sinus pressure.   Respiratory:  Negative for cough, shortness of breath and wheezing.   Gastrointestinal:  Negative for abdominal pain, nausea and vomiting.  Neurological:  Positive for headaches.    {Labs  Heme  Chem  Endocrine  Serology  Results Review (optional):[id number]}   Objective    BP (!)  150/83 (BP Location: Left Arm, Patient Position: Sitting, Cuff Size: Normal)   Pulse 66   Temp 98.4 F (36.9 C) (Oral)   Resp 16   Wt 119 lb 9.6 oz (54.3 kg)   SpO2 97%   BMI 21.19 kg/m  BP Readings from Last 3 Encounters:  12/20/22 (!) 150/83  12/12/22 134/87  10/17/22 138/86   Wt Readings from Last 3 Encounters:  12/20/22 119 lb 9.6 oz (54.3 kg)  12/12/22 120 lb 9.6 oz (54.7 kg)  10/17/22 120 lb (54.4 kg)      Physical Exam  ***  No results found for any visits on 12/20/22.  Assessment & Plan     Problem List Items Addressed This Visit       Cardiovascular and Mediastinum   Essential hypertension     Respiratory   Non-seasonal allergic rhinitis - Primary     Other   Headache, rebound     No follow-ups on file.      I, Lavon Paganini, MD, have reviewed all documentation for this visit. The documentation on 12/20/22 for the exam, diagnosis, procedures, and orders are all accurate and complete.   Mubashir Mallek, Dionne Bucy, MD, MPH [facility]

## 2022-12-20 NOTE — Progress Notes (Signed)
I,Sulibeya S Dimas,acting as a Education administrator for Lavon Paganini, MD.,have documented all relevant documentation on the behalf of Lavon Paganini, MD,as directed by  Lavon Paganini, MD while in the presence of Lavon Paganini, MD.    Established patient visit   Patient: Alexis Lyons   DOB: 10-Mar-1941   82 y.o. Female  MRN: [id number] Visit Date: 12/20/2022  Today's healthcare provider: Lavon Paganini, MD   Chief Complaint  Patient presents with   URI   Subjective    HPI  Upper respiratory symptoms She complains of headache described as migraine, post nasal drip, sinus pressure, and fatigue .with no fever, chills, night sweats or weight loss. Onset of symptoms was a few days ago and gradually improving.She is drinking plenty of fluids.  Past history is significant for no history of pneumonia or bronchitis. Patient is non-smoker patient tested positive for covid over a week ago.   --------------------------------------------------------------------------------------------------- Sinus pressure, headaches, postnasal drip preceded COVID and have been ongoing for few months.  Medications: Outpatient Medications Prior to Visit  Medication Sig   apixaban (ELIQUIS) 2.5 MG TABS tablet Take 1 tablet (2.5 mg total) by mouth 2 (two) times daily.   Cyanocobalamin (B-12 PO) Take by mouth daily.   Ginseng 100 MG CAPS Take 100 mg by mouth 4 (four) times a week.   zolpidem (AMBIEN) 5 MG tablet Take 0.5 tablets (2.5 mg total) by mouth at bedtime as needed for sleep.   No facility-administered medications prior to visit.    Review of Systems  Constitutional:  Positive for fatigue. Negative for chills and fever.  HENT:  Positive for postnasal drip and sinus pressure.   Respiratory:  Negative for cough, shortness of breath and wheezing.   Gastrointestinal:  Negative for abdominal pain, nausea and vomiting.  Neurological:  Positive for headaches.       Objective    BP (!) 150/83  (BP Location: Left Arm, Patient Position: Sitting, Cuff Size: Normal)   Pulse 66   Temp 98.4 F (36.9 C) (Oral)   Resp 16   Wt 119 lb 9.6 oz (54.3 kg)   SpO2 97%   BMI 21.19 kg/m  BP Readings from Last 3 Encounters:  12/20/22 (!) 150/83  12/12/22 134/87  10/17/22 138/86   Wt Readings from Last 3 Encounters:  12/20/22 119 lb 9.6 oz (54.3 kg)  12/12/22 120 lb 9.6 oz (54.7 kg)  10/17/22 120 lb (54.4 kg)      Physical Exam Vitals reviewed.  Constitutional:      General: She is not in acute distress.    Appearance: Normal appearance. She is well-developed. She is not diaphoretic.  HENT:     Head: Normocephalic and atraumatic.     Right Ear: Tympanic membrane, ear canal and external ear normal.     Left Ear: Tympanic membrane, ear canal and external ear normal.     Nose: Nose normal.     Mouth/Throat:     Mouth: Mucous membranes are moist.     Pharynx: Oropharynx is clear. No oropharyngeal exudate.  Eyes:     General: No scleral icterus.    Conjunctiva/sclera: Conjunctivae normal.     Pupils: Pupils are equal, round, and reactive to light.  Neck:     Thyroid: No thyromegaly.  Cardiovascular:     Rate and Rhythm: Normal rate and regular rhythm.     Pulses: Normal pulses.     Heart sounds: Normal heart sounds. No murmur heard. Pulmonary:  Effort: Pulmonary effort is normal. No respiratory distress.     Breath sounds: Normal breath sounds. No wheezing, rhonchi or rales.  Musculoskeletal:     Cervical back: Neck supple.     Right lower leg: No edema.     Left lower leg: No edema.  Lymphadenopathy:     Cervical: No cervical adenopathy.  Skin:    General: Skin is warm and dry.     Findings: No rash.  Neurological:     Mental Status: She is alert and oriented to person, place, and time. Mental status is at baseline.  Psychiatric:        Mood and Affect: Mood normal.        Behavior: Behavior normal.       No results found for any visits on 12/20/22.  Assessment  & Plan     Problem List Items Addressed This Visit       Cardiovascular and Mediastinum   Essential hypertension    BP elevated today and elevated on home readings Patient was previously taken off her antihypertensives due to hypotension She reports she does not check her blood pressure very often anymore because it was worrying her and making it go up I offered for her to start on low-dose amlodipine, but she declines today She will discuss at her upcoming cardiology appointment as well and let me know if she changes her mind        Respiratory   Non-seasonal allergic rhinitis - Primary    Suspect her subacute/chronic sinus pressure and postnasal drip is related to allergic rhinitis rather than sinusitis Advised use of Zyrtec and Flonase        Other   Headache, rebound    Seems to have multifactorial headaches Some may be related to her untreated allergic rhinitis She also seems to have some caffeine rebound headaches She will work on decreasing her caffeine intake and see if they improve        No follow-ups on file.      I, Lavon Paganini, MD, have reviewed all documentation for this visit. The documentation on 12/21/22 for the exam, diagnosis, procedures, and orders are all accurate and complete.   Jolan Mealor, Dionne Bucy, MD, MPH [facility]

## 2022-12-21 NOTE — Assessment & Plan Note (Signed)
Seems to have multifactorial headaches Some may be related to her untreated allergic rhinitis She also seems to have some caffeine rebound headaches She will work on decreasing her caffeine intake and see if they improve

## 2022-12-21 NOTE — Assessment & Plan Note (Signed)
BP elevated today and elevated on home readings Patient was previously taken off her antihypertensives due to hypotension She reports she does not check her blood pressure very often anymore because it was worrying her and making it go up I offered for her to start on low-dose amlodipine, but she declines today She will discuss at her upcoming cardiology appointment as well and let me know if she changes her mind

## 2022-12-21 NOTE — Assessment & Plan Note (Signed)
Suspect her subacute/chronic sinus pressure and postnasal drip is related to allergic rhinitis rather than sinusitis Advised use of Zyrtec and Flonase

## 2023-01-16 ENCOUNTER — Encounter: Payer: Self-pay | Admitting: Emergency Medicine

## 2023-01-16 ENCOUNTER — Emergency Department: Payer: HMO

## 2023-01-16 DIAGNOSIS — Z8616 Personal history of COVID-19: Secondary | ICD-10-CM | POA: Diagnosis not present

## 2023-01-16 DIAGNOSIS — R519 Headache, unspecified: Secondary | ICD-10-CM | POA: Diagnosis not present

## 2023-01-16 DIAGNOSIS — Z79899 Other long term (current) drug therapy: Secondary | ICD-10-CM | POA: Insufficient documentation

## 2023-01-16 DIAGNOSIS — R202 Paresthesia of skin: Secondary | ICD-10-CM | POA: Diagnosis not present

## 2023-01-16 DIAGNOSIS — R4182 Altered mental status, unspecified: Secondary | ICD-10-CM | POA: Diagnosis not present

## 2023-01-16 DIAGNOSIS — I10 Essential (primary) hypertension: Secondary | ICD-10-CM | POA: Insufficient documentation

## 2023-01-16 DIAGNOSIS — R079 Chest pain, unspecified: Secondary | ICD-10-CM | POA: Diagnosis not present

## 2023-01-16 LAB — COMPREHENSIVE METABOLIC PANEL
ALT: 20 U/L (ref 0–44)
AST: 22 U/L (ref 15–41)
Albumin: 4.5 g/dL (ref 3.5–5.0)
Alkaline Phosphatase: 80 U/L (ref 38–126)
Anion gap: 11 (ref 5–15)
BUN: 20 mg/dL (ref 8–23)
CO2: 27 mmol/L (ref 22–32)
Calcium: 9.3 mg/dL (ref 8.9–10.3)
Chloride: 102 mmol/L (ref 98–111)
Creatinine, Ser: 0.78 mg/dL (ref 0.44–1.00)
GFR, Estimated: >60 mL/min (ref >60)
Glucose, Bld: 146 mg/dL — ABNORMAL HIGH (ref 70–99)
Potassium: 3.4 mmol/L — ABNORMAL LOW (ref 3.5–5.1)
Sodium: 140 mmol/L (ref 135–145)
Total Bilirubin: 0.9 mg/dL (ref 0.3–1.2)
Total Protein: 7 g/dL (ref 6.5–8.1)

## 2023-01-16 LAB — CBC WITH DIFFERENTIAL/PLATELET
Abs Immature Granulocytes: 0.02 10*3/uL (ref 0.00–0.07)
Basophils Absolute: 0.1 10*3/uL (ref 0.0–0.1)
Basophils Relative: 1 %
Eosinophils Absolute: 0.2 10*3/uL (ref 0.0–0.5)
Eosinophils Relative: 3 %
HCT: 44 % (ref 36.0–46.0)
Hemoglobin: 14.7 g/dL (ref 12.0–15.0)
Immature Granulocytes: 0 %
Lymphocytes Relative: 25 %
Lymphs Abs: 1.5 10*3/uL (ref 0.7–4.0)
MCH: 29.6 pg (ref 26.0–34.0)
MCHC: 33.4 g/dL (ref 30.0–36.0)
MCV: 88.5 fL (ref 80.0–100.0)
Monocytes Absolute: 0.5 10*3/uL (ref 0.1–1.0)
Monocytes Relative: 8 %
Neutro Abs: 3.9 10*3/uL (ref 1.7–7.7)
Neutrophils Relative %: 63 %
Platelets: 252 10*3/uL (ref 150–400)
RBC: 4.97 MIL/uL (ref 3.87–5.11)
RDW: 12.6 % (ref 11.5–15.5)
WBC: 6.1 10*3/uL (ref 4.0–10.5)
nRBC: 0 % (ref 0.0–0.2)

## 2023-01-16 LAB — CBG MONITORING, ED: Glucose-Capillary: 141 mg/dL — ABNORMAL HIGH (ref 70–99)

## 2023-01-16 LAB — TROPONIN I (HIGH SENSITIVITY): Troponin I (High Sensitivity): 5 ng/L (ref <18)

## 2023-01-16 NOTE — ED Triage Notes (Signed)
Pt presents via POV with complaints of HTN with associated R hand tingling that started this evening. Pt states that the tingling is localized to her hand. Of note, Denies blurred vision, numbness, weakness, CP or SOB.     CBG- 141

## 2023-01-17 ENCOUNTER — Ambulatory Visit (INDEPENDENT_AMBULATORY_CARE_PROVIDER_SITE_OTHER): Payer: HMO | Admitting: Family Medicine

## 2023-01-17 ENCOUNTER — Emergency Department
Admission: EM | Admit: 2023-01-17 | Discharge: 2023-01-17 | Disposition: A | Discharge status: Home / Self Care | Payer: HMO | Attending: Emergency Medicine | Admitting: Emergency Medicine

## 2023-01-17 ENCOUNTER — Encounter: Payer: Self-pay | Admitting: Family Medicine

## 2023-01-17 VITALS — BP 138/90 | HR 68 | Temp 98.6°F | Resp 16 | Ht 63.0 in | Wt 118.8 lb

## 2023-01-17 DIAGNOSIS — I10 Essential (primary) hypertension: Secondary | ICD-10-CM

## 2023-01-17 LAB — URINALYSIS, ROUTINE W REFLEX MICROSCOPIC
Bacteria, UA: NONE SEEN
Bilirubin Urine: NEGATIVE
Glucose, UA: NEGATIVE mg/dL
Hgb urine dipstick: NEGATIVE
Ketones, ur: NEGATIVE mg/dL
Nitrite: NEGATIVE
Protein, ur: NEGATIVE mg/dL
Specific Gravity, Urine: 1.014 (ref 1.005–1.030)
pH: 6 (ref 5.0–8.0)

## 2023-01-17 LAB — TROPONIN I (HIGH SENSITIVITY): Troponin I (High Sensitivity): 5 ng/L (ref <18)

## 2023-01-17 MED ORDER — AMLODIPINE BESYLATE 5 MG PO TABS
2.5000 mg | ORAL_TABLET | Freq: Once | ORAL | Status: AC
  Administered 2023-01-17: 2.5 mg via ORAL
  Filled 2023-01-17: qty 1

## 2023-01-17 MED ORDER — AMLODIPINE BESYLATE 2.5 MG PO TABS: 2.5000 mg | ORAL_TABLET | Freq: Every day | ORAL | 0 refills | Status: DC

## 2023-01-17 NOTE — Progress Notes (Signed)
I,Joseline E Rosas,acting as a scribe for [employer], MD.,have documented all relevant documentation on the behalf of Eulis Foster, MD,as directed by  Eulis Foster, MD while in the presence of Eulis Foster, MD.   Established patient visit   Patient: Alexis Lyons   DOB: 1941/04/13   82 y.o. Female  MRN: [id number] Visit Date: 01/17/2023  Today's healthcare provider: Eulis Foster, MD   Chief Complaint  Patient presents with   Hypertension   Subjective    HPI   Patient was in the ER this am for high blood pressure. 185/110 and 174/110 Left this morning with new medication, amlodipine. Was told to take 1/2 tablet daily. Took the medicine and BP was better.  Patient said last night left arm was numb.  She gets stressed out when she takes BP medication, she doesn't want to take it. Patient has been tired lately, and doesn't know why. Besides this morning she hasn't been taking BP meds.  She reports that she often avoids measuring her BP due to increased anxiety.   Patient has had prior episodes of low potassium  BP this am was 135/90  Medications: Outpatient Medications Prior to Visit  Medication Sig   amLODipine (NORVASC) 2.5 MG tablet Take 1 tablet (2.5 mg total) by mouth daily.   apixaban (ELIQUIS) 2.5 MG TABS tablet Take 1 tablet (2.5 mg total) by mouth 2 (two) times daily.   Cyanocobalamin (B-12 PO) Take by mouth daily.   Ginseng 100 MG CAPS Take 100 mg by mouth 4 (four) times a week.   zolpidem (AMBIEN) 5 MG tablet Take 0.5 tablets (2.5 mg total) by mouth at bedtime as needed for sleep.   No facility-administered medications prior to visit.    Review of Systems     Objective    BP (!) 138/90 (BP Location: Left Arm, Patient Position: Sitting, Cuff Size: Normal)   Pulse 68   Temp 98.6 F (37 C)   Resp 16   Ht '5\' 3"'$  (1.6 m)   Wt 118 lb 12.8 oz (53.9 kg)   BMI 21.04 kg/m    Physical Exam Vitals  reviewed.  Constitutional:      General: She is not in acute distress.    Appearance: Normal appearance. She is not ill-appearing, toxic-appearing or diaphoretic.  Eyes:     Conjunctiva/sclera: Conjunctivae normal.  Cardiovascular:     Rate and Rhythm: Normal rate and regular rhythm.     Pulses: Normal pulses.     Heart sounds: Normal heart sounds. No murmur heard.    No friction rub. No gallop.  Pulmonary:     Effort: Pulmonary effort is normal. No respiratory distress.     Breath sounds: Normal breath sounds. No stridor. No wheezing, rhonchi or rales.  Abdominal:     General: Bowel sounds are normal. There is no distension.     Palpations: Abdomen is soft.     Tenderness: There is no abdominal tenderness.  Musculoskeletal:     Right lower leg: No edema.     Left lower leg: No edema.  Skin:    Findings: No erythema or rash.  Neurological:     Mental Status: She is alert and oriented to person, place, and time.       Results for orders placed or performed during the hospital encounter of 01/17/23  CBC with Differential  Result Value Ref Range   WBC 6.1 4.0 - 10.5 K/uL   RBC 4.97 3.87 - 5.11  MIL/uL   Hemoglobin 14.7 12.0 - 15.0 g/dL   HCT 44.0 36.0 - 46.0 %   MCV 88.5 80.0 - 100.0 fL   MCH 29.6 26.0 - 34.0 pg   MCHC 33.4 30.0 - 36.0 g/dL   RDW 12.6 11.5 - 15.5 %   Platelets 252 150 - 400 K/uL   nRBC 0.0 0.0 - 0.2 %   Neutrophils Relative % 63 %   Neutro Abs 3.9 1.7 - 7.7 K/uL   Lymphocytes Relative 25 %   Lymphs Abs 1.5 0.7 - 4.0 K/uL   Monocytes Relative 8 %   Monocytes Absolute 0.5 0.1 - 1.0 K/uL   Eosinophils Relative 3 %   Eosinophils Absolute 0.2 0.0 - 0.5 K/uL   Basophils Relative 1 %   Basophils Absolute 0.1 0.0 - 0.1 K/uL   Immature Granulocytes 0 %   Abs Immature Granulocytes 0.02 0.00 - 0.07 K/uL  Comprehensive metabolic panel  Result Value Ref Range   Sodium 140 135 - 145 mmol/L   Potassium 3.4 (L) 3.5 - 5.1 mmol/L   Chloride 102 98 - 111 mmol/L    CO2 27 22 - 32 mmol/L   Glucose, Bld 146 (H) 70 - 99 mg/dL   BUN 20 8 - 23 mg/dL   Creatinine, Ser 0.78 0.44 - 1.00 mg/dL   Calcium 9.3 8.9 - 10.3 mg/dL   Total Protein 7.0 6.5 - 8.1 g/dL   Albumin 4.5 3.5 - 5.0 g/dL   AST 22 15 - 41 U/L   ALT 20 0 - 44 U/L   Alkaline Phosphatase 80 38 - 126 U/L   Total Bilirubin 0.9 0.3 - 1.2 mg/dL   GFR, Estimated >60 >60 mL/min   Anion gap 11 5 - 15  Urinalysis, Routine w reflex microscopic -Urine, Clean Catch  Result Value Ref Range   Color, Urine YELLOW (A) YELLOW   APPearance CLEAR (A) CLEAR   Specific Gravity, Urine 1.014 1.005 - 1.030   pH 6.0 5.0 - 8.0   Glucose, UA NEGATIVE NEGATIVE mg/dL   Hgb urine dipstick NEGATIVE NEGATIVE   Bilirubin Urine NEGATIVE NEGATIVE   Ketones, ur NEGATIVE NEGATIVE mg/dL   Protein, ur NEGATIVE NEGATIVE mg/dL   Nitrite NEGATIVE NEGATIVE   Leukocytes,Ua SMALL (A) NEGATIVE   RBC / HPF 0-5 0 - 5 RBC/hpf   WBC, UA 11-20 0 - 5 WBC/hpf   Bacteria, UA NONE SEEN NONE SEEN   Squamous Epithelial / HPF 0-5 0 - 5 /HPF   Mucus PRESENT   CBG monitoring, ED  Result Value Ref Range   Glucose-Capillary 141 (H) 70 - 99 mg/dL  Troponin I (High Sensitivity)  Result Value Ref Range   Troponin I (High Sensitivity) 5 <18 ng/L  Troponin I (High Sensitivity)  Result Value Ref Range   Troponin I (High Sensitivity) 5 <18 ng/L    Assessment & Plan     Problem List Items Addressed This Visit       Cardiovascular and Mediastinum   Essential hypertension - Primary    BP elevated in ED, improved in office today  Reviewed patient's ED records via chart and will have her take 1/2 of the 2.'5mg'$  tablet due to hx of hypotension with low dose of antihypertensive medications  Recommended she follow up with PCP as scheduled on 01/21/23 for blood pressure check and determine if she will need to take full 2.'5mg'$  dose of amlodipine  Recommended patient check BP 1-2 hours after medication  Reviewed RTC precautions F/u  as previously  scheduled          Return in about 10 weeks (around 03/28/2023).        The entirety of the information documented in the History of Present Illness, Review of Systems and Physical Exam were personally obtained by me. Portions of this information were initially documented by Lyndel Pleasure, CMA and reviewed by me for thoroughness and accuracy.Eulis Foster, MD     Eulis Foster, MD  [facility] [phone] (phone) [phone] (fax)  [place]

## 2023-01-17 NOTE — Discharge Instructions (Signed)
Start Amlodipine 2.5 mg daily.  Keep a log of your blood pressures morning, noon and night and take these to your doctor.  Return to the ER for worsening symptoms, persistent vomiting, difficulty breathing or other concerns.

## 2023-01-17 NOTE — ED Provider Notes (Signed)
[facility] Provider Note    Event Date/Time   First MD Initiated Contact with Patient 01/17/23 0222     (approximate)   History   Hypertension   HPI  Alexis Lyons is a 82 y.o. female who presents to the ED from home with a chief complaint of elevated blood pressure.  Patient with a history of hypertension who was on HCTZ 25 mg but taken off 6 months ago for hypotension.  Reports recent increase stress related to her sister.  Felt right hand tingling and posterior headache tonight.  States she occasionally checks her blood pressures and this week they have been high, in the 170s-180s range.  Denies vision changes, neck pain, slurred speech, facial droop, extremity weakness.  Denies recent fever/chills, cough, chest pain, shortness of breath, abdominal pain, nausea, vomiting or dizziness.  No complaints of headache or hand tingling currently.     Past Medical History   Past Medical History:  Diagnosis Date   Amnesia    Aphthae    Atrial fibrillation ([place])    Congestion-fibrosis syndrome    COVID 12/12/2022   DD (diverticular disease)    GERD (gastroesophageal reflux disease)    Seizures ([place]) 12/2017   passed out due to dehydration     Active Problem List   Patient Active Problem List   Diagnosis Date Noted   Non-seasonal allergic rhinitis 12/20/2022   Headache, rebound 12/20/2022   Hypokalemia 08/16/2022   Hypocalcemia 08/16/2022   Syncope and collapse 08/16/2022   Abnormal EKG 08/16/2022   Hypomagnesemia 08/16/2022   Toe tendonitis 08/15/2022   Elevated LDL cholesterol level 08/15/2022   Strain of right deltoid muscle 01/11/2022   Neuropathy 10/09/2021   Tingling sensation 09/15/2021   Hypotension due to drugs 08/28/2021   Paroxysmal atrial fibrillation ([place]) 06/15/2021   Senile purpura ([place]) 06/15/2021   Chronic headache 01/01/2019   Essential hypertension 07/09/2018   Osteopenia 11/06/2017   Aphthae 05/28/2015   DD  (diverticular disease) 05/28/2015   Fatigue 05/28/2015   Hernia, inguinal, right 05/28/2015   Amnesia 05/28/2015   Congestion-fibrosis syndrome 05/28/2015   Atrophy of vagina 05/28/2015   Vaginal lesion 09/21/2008   Insomnia 12/17/2004     Past Surgical History   Past Surgical History:  Procedure Laterality Date   ATRIAL FIBRILLATION ABLATION N/A 11/02/2021   Procedure: ATRIAL FIBRILLATION ABLATION;  Surgeon: Vickie Epley, MD;  Location: [facility] CV LAB;  Service: Cardiovascular;  Laterality: N/A;   CHOLECYSTECTOMY     colonoscopy with polypectomy     8/11 4 mm polyp, 12 mm polyp   COLONOSCOPY WITH PROPOFOL N/A 02/24/2018   Procedure: COLONOSCOPY WITH PROPOFOL;  Surgeon: Manya Silvas, MD;  Location: [facility] ENDOSCOPY;  Service: Endoscopy;  Laterality: N/A;   HERNIA REPAIR Right 2004   inguinal    HERNIA REPAIR     x's 3     Home Medications   Prior to Admission medications   Medication Sig Start Date End Date Taking? Authorizing Provider  amLODipine (NORVASC) 2.5 MG tablet Take 1 tablet (2.5 mg total) by mouth daily. 01/17/23 02/16/23 Yes Paulette Blanch, MD  apixaban (ELIQUIS) 2.5 MG TABS tablet Take 1 tablet (2.5 mg total) by mouth 2 (two) times daily. 09/14/22   Kate Sable, MD  Cyanocobalamin (B-12 PO) Take by mouth daily.    [provider]  Ginseng 100 MG CAPS Take 100 mg by mouth 4 (four) times a week. 06/08/09   [provider]  zolpidem (AMBIEN) 5 MG tablet Take 0.5 tablets (2.5 mg total) by mouth at bedtime as needed for sleep. 12/18/22   Bacigalupo, Dionne Bucy, MD     Allergies  Codeine   Family History   Family History  Problem Relation Age of Onset   Alzheimer's disease Mother    Alcohol abuse Father    Cancer Father        liver cancer   Cancer Sister        breast   Breast cancer Sister      Physical Exam  Triage Vital Signs: ED Triage Vitals  Enc Vitals Group     BP 01/16/23 2225 (!) 156/114     Pulse Rate 01/16/23  2225 70     Resp 01/16/23 2225 18     Temp 01/16/23 2225 98.3 F (36.8 C)     Temp Source 01/16/23 2225 Oral     SpO2 01/16/23 2225 96 %     Weight 01/16/23 2228 117 lb (53.1 kg)     Height 01/16/23 2228 '5\' 3"'$  (1.6 m)     Head Circumference --      Peak Flow --      Pain Score 01/16/23 2228 0     Pain Loc --      Pain Edu? --      Excl. in [place]? --     Updated Vital Signs: BP (!) 155/83   Pulse 61   Temp 97.6 F (36.4 C) (Oral)   Resp 19   Ht '5\' 3"'$  (1.6 m)   Wt 53.1 kg   SpO2 98%   BMI 20.73 kg/m    General: Awake, no distress.  CV:  RRR.  Good peripheral perfusion.  Resp:  Normal effort.  CTAB. Abd:  Nontender.  No distention.  Other:  PERRL.  EOMI.  No carotid bruits.  Supple neck without meningismus.  Alert and oriented x 3.  CN II toXII recently intact.  5/5 motor strength and sensation all extremities. MAEx4.   ED Results / Procedures / Treatments  Labs (all labs ordered are listed, but only abnormal results are displayed) Labs Reviewed  COMPREHENSIVE METABOLIC PANEL - Abnormal; Notable for the following components:      Result Value   Potassium 3.4 (*)    Glucose, Bld 146 (*)    All other components within normal limits  CBG MONITORING, ED - Abnormal; Notable for the following components:   Glucose-Capillary 141 (*)    All other components within normal limits  CBC WITH DIFFERENTIAL/PLATELET  URINALYSIS, ROUTINE W REFLEX MICROSCOPIC  TROPONIN I (HIGH SENSITIVITY)  TROPONIN I (HIGH SENSITIVITY)     EKG  ED ECG REPORT I, Keni Elison J, the attending physician, personally viewed and interpreted this ECG.   Date: 01/17/2023  EKG Time: 2236  Rate: 71  Rhythm: normal sinus rhythm  Axis: Normal  Intervals:none  ST&T Change: Nonspecific    RADIOLOGY I have independently visualized and interpreted patient's CT and x-ray as well as noted the radiology interpretation:  CT head: No ICH  Chest x-ray: No acute cardiopulmonary process  Official  radiology report(s): DG Chest Port 1 View  Result Date: 01/16/2023 CLINICAL DATA:  Chest pain EXAM: PORTABLE CHEST 1 VIEW COMPARISON:  08/15/2022 FINDINGS: Cardiac shadow is stable. Aortic calcifications are seen. The lungs are well aerated bilaterally. No focal infiltrate or effusion is seen. No bony abnormality is noted. IMPRESSION: No active disease. Electronically Signed   By: Linus Mako.D.  On: 01/16/2023 22:53   CT HEAD WO CONTRAST (5MM)  Result Date: 01/16/2023 CLINICAL DATA:  Altered mental status EXAM: CT HEAD WITHOUT CONTRAST TECHNIQUE: Contiguous axial images were obtained from the base of the skull through the vertex without intravenous contrast. RADIATION DOSE REDUCTION: This exam was performed according to the departmental dose-optimization program which includes automated exposure control, adjustment of the mA and/or kV according to patient size and/or use of iterative reconstruction technique. COMPARISON:  MRI brain dated 08/16/2022 FINDINGS: Brain: No evidence of acute infarction, hemorrhage, hydrocephalus, extra-axial collection or mass lesion/mass effect. Mild subcortical white matter and periventricular small vessel ischemic changes. Vascular: No hyperdense vessel or unexpected calcification. Skull: Normal. Negative for fracture or focal lesion. Sinuses/Orbits: The visualized paranasal sinuses are essentially clear. The mastoid air cells are unopacified. Other: None. IMPRESSION: No evidence of acute intracranial abnormality. Mild small vessel ischemic changes. Electronically Signed   By: Julian Hy M.D.   On: 01/16/2023 22:44     PROCEDURES:  Critical Care performed: No  .1-3 Lead EKG Interpretation  Performed by: Paulette Blanch, MD Authorized by: Paulette Blanch, MD     Interpretation: normal     ECG rate:  70   ECG rate assessment: normal     Rhythm: sinus rhythm     Ectopy: none     Conduction: normal   Comments:     Patient placed on cardiac monitor to evaluate  for arrhythmias    MEDICATIONS ORDERED IN ED: Medications  amLODipine (NORVASC) tablet 2.5 mg (has no administration in time range)     IMPRESSION / MDM / ASSESSMENT AND PLAN / ED COURSE  I reviewed the triage vital signs and the nursing notes.                             82 year old female presenting with elevated blood pressure, headache and right hand tingling.  Differential diagnosis includes but is not limited to [place], CVA, TIA, ACS, etc.  I have personally reviewed patient's records and note a PCP office visit for hypertension, headache and allergic rhinitis on 12/20/2022.  Pressure at that time 150/83.  Per notes, PCP offered to start patient on low-dose amlodipine but she declined at that time.  Patient's presentation is most consistent with acute presentation with potential threat to life or bodily function.  The patient is on the cardiac monitor to evaluate for evidence of arrhythmia and/or significant heart rate changes.  Laboratory results demonstrate normal WBC 6.1, normal electrolytes, 2 sets of negative troponins.  CT and chest x-ray unremarkable.  Patient has provided in specimen; will send and patient will follow-up results in [place].  She agrees now to start low-dose amlodipine.  Will send 1 home with patient as she has not decided whether or not she wants to take it now or when she wakes up in the morning.  She will follow-up closely with her PCP.  Strict return precautions given.  Patient verbalizes understanding and agrees with plan of care.      FINAL CLINICAL IMPRESSION(S) / ED DIAGNOSES   Final diagnoses:  Hypertension, unspecified type     Rx / DC Orders   ED Discharge Orders          Ordered    amLODipine (NORVASC) 2.5 MG tablet  Daily        01/17/23 0254             Note:  This document  was prepared using Systems analyst and may include unintentional dictation errors.   Paulette Blanch, MD 01/17/23 [phone]

## 2023-01-17 NOTE — Patient Instructions (Signed)
Please take 1/2 tablet of the amlodipine 2.'5mg'$  daily

## 2023-01-18 NOTE — Assessment & Plan Note (Signed)
BP elevated in ED, improved in office today  Reviewed patient's ED records via chart and will have her take 1/2 of the 2.'5mg'$  tablet due to hx of hypotension with low dose of antihypertensive medications  Recommended she follow up with PCP as scheduled on 01/21/23 for blood pressure check and determine if she will need to take full 2.'5mg'$  dose of amlodipine  Recommended patient check BP 1-2 hours after medication  Reviewed RTC precautions F/u as previously scheduled

## 2023-01-18 NOTE — Progress Notes (Unsigned)
I,Witt Plitt S Cori Justus,acting as a Education administrator for Lavon Paganini, MD.,have documented all relevant documentation on the behalf of Lavon Paganini, MD,as directed by  Lavon Paganini, MD while in the presence of Lavon Paganini, MD.    Established patient visit   Patient: Alexis Lyons   DOB: [birth year]   82 y.o. Female  MRN: [id number] Visit Date: 01/21/2023  Today's healthcare provider: Lavon Paganini, MD   Chief Complaint  Patient presents with   Hypertension   Subjective    HPI  Patient would like to go medication change amlodipine 2.5 mg. She reports taking medication once daily. Her concern is that in the hospital she was given a half a tablet. So she has been taking half of a tablet of the 2.5 mg.   Patient requesting refill on Ambien 5 mg. She reports she will be going away and has trouble sleeping.   Chronic postnasal drip - not using any nose sprays. Takes sudafed prn - discussed that this can raise BP.  Medications: Outpatient Medications Prior to Visit  Medication Sig   apixaban (ELIQUIS) 2.5 MG TABS tablet Take 1 tablet (2.5 mg total) by mouth 2 (two) times daily.   Cyanocobalamin (B-12 PO) Take by mouth daily.   Ginseng 100 MG CAPS Take 100 mg by mouth 4 (four) times a week.   [DISCONTINUED] amLODipine (NORVASC) 2.5 MG tablet Take 1 tablet (2.5 mg total) by mouth daily.   [DISCONTINUED] zolpidem (AMBIEN) 5 MG tablet Take 0.5 tablets (2.5 mg total) by mouth at bedtime as needed for sleep.   No facility-administered medications prior to visit.    Review of Systems per HPI     Objective    BP (!) 147/83   Pulse 71   Temp 98.6 F (37 C) (Temporal)   Resp 16   Wt 121 lb (54.9 kg)   SpO2 98%   BMI 21.43 kg/m    Physical Exam Vitals reviewed.  Constitutional:      General: She is not in acute distress.    Appearance: Normal appearance. She is well-developed. She is not diaphoretic.  HENT:     Head: Normocephalic and atraumatic.  Eyes:      General: No scleral icterus.    Conjunctiva/sclera: Conjunctivae normal.  Neck:     Thyroid: No thyromegaly.  Cardiovascular:     Rate and Rhythm: Normal rate and regular rhythm.     Pulses: Normal pulses.     Heart sounds: Normal heart sounds. No murmur heard. Pulmonary:     Effort: Pulmonary effort is normal. No respiratory distress.     Breath sounds: Normal breath sounds. No wheezing, rhonchi or rales.  Musculoskeletal:     Cervical back: Neck supple.     Right lower leg: No edema.     Left lower leg: No edema.  Lymphadenopathy:     Cervical: No cervical adenopathy.  Skin:    General: Skin is warm and dry.     Findings: No rash.  Neurological:     Mental Status: She is alert and oriented to person, place, and time. Mental status is at baseline.  Psychiatric:        Mood and Affect: Mood normal.        Behavior: Behavior normal.       No results found for any visits on 01/21/23.  Assessment & Plan     Problem List Items Addressed This Visit       Cardiovascular and Mediastinum   Essential hypertension -  Primary    Slightly elevated today Hesitant with medications due to episode of hypotension with syncope Taking 1/2 of a pill of amlodipine 2.'5mg'$  daily Advised to increase to 2.'5mg'$  daily Avoid sudafed      Relevant Medications   amLODipine (NORVASC) 2.5 MG tablet     Genitourinary   Vaginal lesion    Not examined today  Discussed wide differential diagnosis Patient would like to see GYN - referral placed today      Relevant Orders   Ambulatory referral to Gynecology     Other   Insomnia    Refilled ambien for upcoming trip Doing well on Ambien 2.'5mg'$  qhs prn when traveling or changing time zones Takes sparingly Discussed risks of hypersomnolence, falls, confusion in the elderly Will not plan to escalate dose or number of pills given in the future      Postnasal drip    Add flonase daily at bedtime Will buy OTC      Other Visit Diagnoses      Hair loss          - likely telogen effluvium after recent COVID illness - consider labs at next visit - can try rogaine if she wants   No follow-ups on file.      I, Lavon Paganini, MD, have reviewed all documentation for this visit. The documentation on 01/21/23 for the exam, diagnosis, procedures, and orders are all accurate and complete.   Bacigalupo, Dionne Bucy, MD, MPH [facility]

## 2023-01-21 ENCOUNTER — Encounter: Payer: Self-pay | Admitting: Family Medicine

## 2023-01-21 ENCOUNTER — Ambulatory Visit (INDEPENDENT_AMBULATORY_CARE_PROVIDER_SITE_OTHER): Payer: HMO | Admitting: Family Medicine

## 2023-01-21 VITALS — BP 147/83 | HR 71 | Temp 98.6°F | Resp 16 | Wt 121.0 lb

## 2023-01-21 DIAGNOSIS — N898 Other specified noninflammatory disorders of vagina: Secondary | ICD-10-CM

## 2023-01-21 DIAGNOSIS — F5102 Adjustment insomnia: Secondary | ICD-10-CM | POA: Diagnosis not present

## 2023-01-21 DIAGNOSIS — L659 Nonscarring hair loss, unspecified: Secondary | ICD-10-CM

## 2023-01-21 DIAGNOSIS — I10 Essential (primary) hypertension: Secondary | ICD-10-CM | POA: Diagnosis not present

## 2023-01-21 DIAGNOSIS — R0982 Postnasal drip: Secondary | ICD-10-CM | POA: Diagnosis not present

## 2023-01-21 MED ORDER — AMLODIPINE BESYLATE 2.5 MG PO TABS: 2.5000 mg | ORAL_TABLET | Freq: Every day | ORAL | 1 refills | Status: DC

## 2023-01-21 MED ORDER — ZOLPIDEM TARTRATE 5 MG PO TABS: 2.5000 mg | ORAL_TABLET | Freq: Every evening | ORAL | 3 refills | Status: DC | PRN

## 2023-01-21 NOTE — Assessment & Plan Note (Signed)
Not examined today  Discussed wide differential diagnosis Patient would like to see GYN - referral placed today

## 2023-01-21 NOTE — Assessment & Plan Note (Signed)
Refilled ambien for upcoming trip Doing well on Ambien 2.'5mg'$  qhs prn when traveling or changing time zones Takes sparingly Discussed risks of hypersomnolence, falls, confusion in the elderly Will not plan to escalate dose or number of pills given in the future

## 2023-01-21 NOTE — Assessment & Plan Note (Signed)
Add flonase daily at bedtime Will buy OTC

## 2023-01-21 NOTE — Assessment & Plan Note (Signed)
Slightly elevated today Hesitant with medications due to episode of hypotension with syncope Taking 1/2 of a pill of amlodipine 2.'5mg'$  daily Advised to increase to 2.'5mg'$  daily Avoid sudafed

## 2023-01-24 ENCOUNTER — Encounter: Payer: HMO | Admitting: Obstetrics

## 2023-01-24 ENCOUNTER — Ambulatory Visit: Payer: Self-pay

## 2023-01-24 ENCOUNTER — Ambulatory Visit (INDEPENDENT_AMBULATORY_CARE_PROVIDER_SITE_OTHER): Payer: HMO | Admitting: Physician Assistant

## 2023-01-24 ENCOUNTER — Encounter (HOSPITAL_COMMUNITY): Payer: Self-pay | Admitting: *Deleted

## 2023-01-24 VITALS — BP 136/91 | HR 74 | Wt 118.0 lb

## 2023-01-24 DIAGNOSIS — R42 Dizziness and giddiness: Secondary | ICD-10-CM | POA: Diagnosis not present

## 2023-01-24 DIAGNOSIS — I10 Essential (primary) hypertension: Secondary | ICD-10-CM | POA: Diagnosis not present

## 2023-01-24 DIAGNOSIS — R002 Palpitations: Secondary | ICD-10-CM

## 2023-01-24 DIAGNOSIS — I480 Paroxysmal atrial fibrillation: Secondary | ICD-10-CM

## 2023-01-24 NOTE — Progress Notes (Unsigned)
Established patient visit   Patient: Alexis Lyons   DOB: 08/05/1941   82 y.o. Female  MRN: [id number] Visit Date: 01/24/2023  Today's healthcare provider: Mardene Speak, PA-C   No chief complaint on file.  Subjective    HPI  Patient is an 82 year old female who presents for evaluation of elevated blood pressure.  She has history of hypertension and atrial fib.  She had been off of her blood pressure medications for about 6 months due to hypotension prior to her event of elevated blood pressure last week.   She was seen in the ER on 01/16/23 with BP of 156/114.  She was ultimately given Amlodipine and had follow up  with Dr Quentin Cornwall on 01/17/23 and then Dr B on 01/21/23.   Patient has history of reaction to Amlodipine.  She was doing ok until the dose was increased from 1/2 to 1 pill.   Since that time she has been having flushed feeling in her face, heart pounding, palpitations and ache in her left shoulder.    Medications: Outpatient Medications Prior to Visit  Medication Sig   amLODipine (NORVASC) 2.5 MG tablet Take 1 tablet (2.5 mg total) by mouth daily.   apixaban (ELIQUIS) 2.5 MG TABS tablet Take 1 tablet (2.5 mg total) by mouth 2 (two) times daily.   Cyanocobalamin (B-12 PO) Take by mouth daily.   Ginseng 100 MG CAPS Take 100 mg by mouth 4 (four) times a week.   zolpidem (AMBIEN) 5 MG tablet Take 0.5 tablets (2.5 mg total) by mouth at bedtime as needed for sleep.   No facility-administered medications prior to visit.    Review of Systems  Eyes:  Negative for visual disturbance.  Respiratory:  Negative for chest tightness, shortness of breath and wheezing.   Cardiovascular:  Positive for palpitations. Negative for chest pain and leg swelling.  Neurological:  Positive for dizziness. Negative for headaches.    {Labs  Heme  Chem  Endocrine  Serology  Results Review (optional):23779}   Objective    BP (!) 136/91 (BP Location: Left Arm, Patient Position: Sitting,  Cuff Size: Normal)   Pulse 74   Wt 118 lb (53.5 kg)   SpO2 100%   BMI 20.90 kg/m  {Show previous vital signs (optional):23777}  Physical Exam Vitals reviewed.  Constitutional:      General: She is not in acute distress.    Appearance: Normal appearance. She is well-developed. She is not diaphoretic.  HENT:     Head: Normocephalic and atraumatic.  Eyes:     General: No scleral icterus.    Conjunctiva/sclera: Conjunctivae normal.  Neck:     Thyroid: No thyromegaly.  Cardiovascular:     Rate and Rhythm: Normal rate. Rhythm irregular.     Pulses: Normal pulses.     Heart sounds: Normal heart sounds. No murmur heard. Pulmonary:     Effort: Pulmonary effort is normal. No respiratory distress.     Breath sounds: Normal breath sounds. No wheezing, rhonchi or rales.  Musculoskeletal:     Cervical back: Neck supple.     Right lower leg: No edema.     Left lower leg: No edema.  Lymphadenopathy:     Cervical: No cervical adenopathy.  Skin:    General: Skin is warm and dry.     Findings: No rash.  Neurological:     Mental Status: She is alert and oriented to person, place, and time. Mental status is at baseline.  Psychiatric:        Mood and Affect: Mood normal.        Behavior: Behavior normal.      No results found for any visits on 01/24/23.  Assessment & Plan     Essential hypertension Dizziness Palpitations Chronic and uncontrolled BP today was 136/91, not at goal She has been taking amlodipine 2.5 mg Could be possible reaction on taking almodipine vs stress/anxiety However, in the past/chart review of 06/2018 showed that she has been taking amlodipine and developed a reaction on it, the same symptoms she has been having today Med was d/c and pt has been taking lisinopril in the past , unclear why she transitioned to HCTZ. She stopped taking HCTZ after she developed a hypotensive reaction on HCTZ. She has been out of medications until 01/17/23 when her BP increased to  170-180s range. Advised to stop taking amlodipine and start to measure her BP at home If BP will be more than 140/90, advised to start taking HCTZ 12.5 mg /2 or 1/2 tablet daily    Relaxation/mindfulness techniques were recommended. Might consider SSRIs? Delayed treatment may result in poorer clinical outcomes compared with patients treated within 1 year of symptom onset. However, pt declined pharmacological managemnt of her stress.  Psychodynamic psychotherapy might be considered : patient discovering and verbalizing unconscious conflicts  - Ambulatory referral to Psychiatry - Ambulatory referral to Psychology. Will check with her PCP on suggestions. - escitalopram (LEXAPRO) 10 MG tablet; Take 1 tablet (10 mg total) by mouth daily.  Dispense: 45 tablet; Refill: 0  Benefits and risks were discussed. Patient agreed and expressed his understanding.   The patient was advised to call back or seek an in-person evaluation if the symptoms worsen or if the condition fails to improve as anticipated.  - EKG 12-Lead sinus rhythm with premature atrial complexes  In the setting of A fib Paroxysmal atrial fibrillation (HCC)   FU in 2 weeks with PCP  The patient was advised to call back or seek an in-person evaluation if the symptoms worsen or if the condition fails to improve as anticipated.  I discussed the assessment and treatment plan with the patient. The patient was provided an opportunity to ask questions and all were answered. The patient agreed with the plan and demonstrated an understanding of the instructions.  The entirety of the information documented in the History of Present Illness, Review of Systems and Physical Exam were personally obtained by me. Portions of this information were initially documented by the CMA , Althea Charon  and reviewed by me for thoroughness and accuracy.  Mardene Speak, [facility], [place] [phone] (phone) [phone] (fax)

## 2023-01-24 NOTE — Telephone Encounter (Signed)
  Chief Complaint: PT thinks this is from amlodipine - PT had a similar side effect when she took this medication in the past Palpitations Dizziness, Disorientation, HA, High BP readings. Symptoms: above Frequency: 3 days Pertinent Negatives: Patient denies Chest pain Disposition: '[]'$ ED /'[]'$ Urgent Care (no appt availability in office) / '[x]'$ Appointment(In office/virtual)/ '[]'$  [facility]/ '[]'$ Home Care/ '[]'$ Refused Recommended Disposition /'[]'$ [facility]/ '[x]'$  Follow-up with PCP Additional Notes: PT started amlodipine last week. She has taken this medication in the past and had similar side effects. PT believes that these s/s are from the amlodipine.Pt states she has dizziness, HA, a bit disoriented, and feels like her heart is pounding.   Reason for Disposition  [7] Systolic BP  >= 253 OR Diastolic >= 664 AND [4] having NO cardiac or neurologic symptoms  Answer Assessment - Initial Assessment Questions 1. DESCRIPTION: "Please describe your heart rate or heartbeat that you are having" (e.g., fast/slow, regular/irregular, skipped or extra beats, "palpitations")     Palpitations 2. ONSET: "When did it start?" (Minutes, hours or days)      3 days 3. DURATION: "How long does it last" (e.g., seconds, minutes, hours)      4. PATTERN "Does it come and go, or has it been constant since it started?"  "Does it get worse with exertion?"   "Are you feeling it now?"      5. TAP: "Using your hand, can you tap out what you are feeling on a chair or table in front of you, so that I can hear?" (Note: not all patients can do this)        6. HEART RATE: "Can you tell me your heart rate?" "How many beats in 15 seconds?"  (Note: not all patients can do this)        7. RECURRENT SYMPTOM: "Have you ever had this before?" If Yes, ask: "When was the last time?" and "What happened that time?"       8. CAUSE: "What do you think is causing the palpitations?"     Amlodipine 9. CARDIAC HISTORY: "Do you  have any history of heart disease?" (e.g., heart attack, angina, bypass surgery, angioplasty, arrhythmia)       10. OTHER SYMPTOMS: "Do you have any other symptoms?" (e.g., dizziness, chest pain, sweating, difficulty breathing)       Dizziness, High BP readings, HA, disoriented. 11. PREGNANCY: "Is there any chance you are pregnant?" "When was your last menstrual period?"       Na  Answer Assessment - Initial Assessment Questions 1. BLOOD PRESSURE: "What is the blood pressure?" "Did you take at least two measurements 5 minutes apart?"     159/90 2. ONSET: "When did you take your blood pressure?"     2 days ago 3. HOW: "How did you take your blood pressure?" (e.g., automatic home BP monitor, visiting nurse)     Home automatic 4. HISTORY: "Do you have a history of high blood pressure?"     yes 5. MEDICINES: "Are you taking any medicines for blood pressure?" "Have you missed any doses recently?"     Amlodipine 6. OTHER SYMPTOMS: "Do you have any symptoms?" (e.g., blurred vision, chest pain, difficulty breathing, headache, weakness)     Dizziness, High BP readings, HA, disoriented. 7. PREGNANCY: "Is there any chance you are pregnant?" "When was your last menstrual period?"  Protocols used: Heart Rate and Heartbeat Questions-A-AH, Blood Pressure - High-A-AH

## 2023-01-25 ENCOUNTER — Telehealth: Payer: Self-pay

## 2023-01-25 NOTE — Telephone Encounter (Signed)
     Patient  visit on 2/1  at [facility] you been able to follow up with your primary care physician? Yes   The patient was or was not able to obtain any needed medicine or equipment. Yes   Are there diet recommendations that you are having difficulty following? Na   Patient expresses understanding of discharge instructions and education provided has no other needs at this time.  Yes      [facility] [id number] [address], [place], [place] [address] Phone: [phone] Email: [email or web address]

## 2023-01-26 ENCOUNTER — Encounter: Payer: Self-pay | Admitting: Physician Assistant

## 2023-01-26 DIAGNOSIS — R42 Dizziness and giddiness: Secondary | ICD-10-CM | POA: Insufficient documentation

## 2023-01-26 DIAGNOSIS — R002 Palpitations: Secondary | ICD-10-CM | POA: Insufficient documentation

## 2023-01-28 ENCOUNTER — Encounter: Payer: Self-pay | Admitting: Family Medicine

## 2023-01-28 ENCOUNTER — Ambulatory Visit (INDEPENDENT_AMBULATORY_CARE_PROVIDER_SITE_OTHER): Payer: HMO | Admitting: Family Medicine

## 2023-01-28 VITALS — BP 148/74 | HR 101 | Temp 97.6°F | Resp 20 | Wt 118.0 lb

## 2023-01-28 DIAGNOSIS — I10 Essential (primary) hypertension: Secondary | ICD-10-CM

## 2023-01-28 DIAGNOSIS — N9089 Other specified noninflammatory disorders of vulva and perineum: Secondary | ICD-10-CM

## 2023-01-28 MED ORDER — HYDROCHLOROTHIAZIDE 12.5 MG PO TABS: 12.5000 mg | ORAL_TABLET | Freq: Every day | ORAL | 3 refills | Status: DC

## 2023-01-28 NOTE — Assessment & Plan Note (Signed)
Has not been examined before Unable to see GYN until next month No signs of herpetic infection Lesion appears to be a friction/pressure ulceration Recommend avoiding pressure in the area - sleep without underwear, never wear jeans without underwear, avoid pads Also recommend vaseline for skin protectant while healing

## 2023-01-28 NOTE — Assessment & Plan Note (Signed)
Chronic and uncontrolled Ok to take hctz 12.5 mg daily Need to avoid hypotension given h/o syncopal episode Does not tolerate amlodipine Consider lisinopril in the future if needed - was on this previously but it seems to have fallen off at some point - does not remember any side effects

## 2023-01-28 NOTE — Progress Notes (Signed)
I,Sulibeya S Dimas,acting as a Education administrator for Lavon Paganini, MD.,have documented all relevant documentation on the behalf of Lavon Paganini, MD,as directed by  Lavon Paganini, MD while in the presence of Lavon Paganini, MD.     Established patient visit   Patient: Alexis Lyons   DOB: 1941-05-14   82 y.o. Female  MRN: [id number] Visit Date: 01/28/2023  Today's healthcare provider: Lavon Paganini, MD   Chief Complaint  Patient presents with   Rash   Subjective    HPI  Patient C/O vaginal lesion x 1 week. Patient reports lesion is more irritated due to wearing jeans. She denies any discharge. She has been using neosporin, reports no change with neosporin.  Never had this before.    Medications: Outpatient Medications Prior to Visit  Medication Sig   apixaban (ELIQUIS) 2.5 MG TABS tablet Take 1 tablet (2.5 mg total) by mouth 2 (two) times daily.   Cyanocobalamin (B-12 PO) Take by mouth daily.   zolpidem (AMBIEN) 5 MG tablet Take 0.5 tablets (2.5 mg total) by mouth at bedtime as needed for sleep.   [DISCONTINUED] Ginseng 100 MG CAPS Take 100 mg by mouth 4 (four) times a week.   No facility-administered medications prior to visit.    Review of Systems  Constitutional:  Negative for chills and fever.  Genitourinary:  Positive for genital sores. Negative for vaginal discharge.  Skin:  Positive for rash.       Objective    BP (!) 148/74 (BP Location: Left Arm, Patient Position: Sitting, Cuff Size: Normal)   Pulse (!) 101   Temp 97.6 F (36.4 C) (Temporal)   Resp 20   Wt 118 lb (53.5 kg)   BMI 20.90 kg/m    Physical Exam Vitals reviewed.  Constitutional:      General: She is not in acute distress.    Appearance: She is well-developed.  HENT:     Head: Normocephalic and atraumatic.  Eyes:     General: No scleral icterus.    Conjunctiva/sclera: Conjunctivae normal.  Cardiovascular:     Rate and Rhythm: Normal rate and regular rhythm.  Pulmonary:      Effort: Pulmonary effort is normal. No respiratory distress.  Genitourinary:    Labia:        Left: Lesion present.      Comments: Lesion of inner L labia menora  Skin:    General: Skin is warm and dry.     Findings: No rash.  Neurological:     Mental Status: She is alert and oriented to person, place, and time.  Psychiatric:        Behavior: Behavior normal.       No results found for any visits on 01/28/23.  Assessment & Plan     Problem List Items Addressed This Visit       Cardiovascular and Mediastinum   Essential hypertension    Chronic and uncontrolled Ok to take hctz 12.5 mg daily Need to avoid hypotension given h/o syncopal episode Does not tolerate amlodipine Consider lisinopril in the future if needed - was on this previously but it seems to have fallen off at some point - does not remember any side effects      Relevant Medications   hydrochlorothiazide (HYDRODIURIL) 12.5 MG tablet     Genitourinary   Vulvar lesion - Primary    Has not been examined before Unable to see GYN until next month No signs of herpetic infection Lesion appears to be a  friction/pressure ulceration Recommend avoiding pressure in the area - sleep without underwear, never wear jeans without underwear, avoid pads Also recommend vaseline for skin protectant while healing        Return for as scheduled.      Total time spent on today's visit was greater than 25 minutes, including both face-to-face time and nonface-to-face time personally spent on review of chart (labs and imaging), discussing goals, treatment options, answering patient's questions, and coordinating care.   I, Lavon Paganini, MD, have reviewed all documentation for this visit. The documentation on 01/28/23 for the exam, diagnosis, procedures, and orders are all accurate and complete.   Aleysha Meckler, Dionne Bucy, MD, MPH [facility]

## 2023-02-05 ENCOUNTER — Encounter: Payer: Self-pay | Admitting: Cardiology

## 2023-02-05 ENCOUNTER — Other Ambulatory Visit
Admission: RE | Admit: 2023-02-05 | Discharge: 2023-02-05 | Disposition: A | Discharge status: Home / Self Care | Payer: HMO | Source: Ambulatory Visit | Attending: Cardiology | Admitting: Cardiology

## 2023-02-05 ENCOUNTER — Telehealth: Payer: Self-pay | Admitting: Cardiology

## 2023-02-05 ENCOUNTER — Ambulatory Visit: Payer: PPO | Attending: Cardiology | Admitting: Cardiology

## 2023-02-05 VITALS — BP 160/88 | HR 70 | Ht 63.0 in | Wt 119.0 lb

## 2023-02-05 DIAGNOSIS — R079 Chest pain, unspecified: Secondary | ICD-10-CM | POA: Insufficient documentation

## 2023-02-05 DIAGNOSIS — I10 Essential (primary) hypertension: Secondary | ICD-10-CM

## 2023-02-05 DIAGNOSIS — I480 Paroxysmal atrial fibrillation: Secondary | ICD-10-CM

## 2023-02-05 LAB — BASIC METABOLIC PANEL
Anion gap: 11 (ref 5–15)
BUN: 16 mg/dL (ref 8–23)
CO2: 28 mmol/L (ref 22–32)
Calcium: 9.5 mg/dL (ref 8.9–10.3)
Chloride: 101 mmol/L (ref 98–111)
Creatinine, Ser: 0.84 mg/dL (ref 0.44–1.00)
GFR, Estimated: >60 mL/min (ref >60)
Glucose, Bld: 101 mg/dL — ABNORMAL HIGH (ref 70–99)
Potassium: 3.6 mmol/L (ref 3.5–5.1)
Sodium: 140 mmol/L (ref 135–145)

## 2023-02-05 MED ORDER — METOPROLOL SUCCINATE ER 25 MG PO TB24: 12.5000 mg | ORAL_TABLET | Freq: Every day | ORAL | 0 refills | Status: DC

## 2023-02-05 MED ORDER — METOPROLOL TARTRATE 100 MG PO TABS: 100.0000 mg | ORAL_TABLET | Freq: Once | ORAL | 0 refills | Status: DC

## 2023-02-05 NOTE — Telephone Encounter (Signed)
Pt c/o of Chest Pain: STAT if CP now or developed within 24 hours  1. Are you having CP right now? Yes, soreness   2. Are you experiencing any other symptoms (ex. SOB, nausea, vomiting, sweating)? no  3. How long have you been experiencing CP? Sunday afternoon  4. Is your CP continuous or coming and going? Continuous   5. Have you taken Nitroglycerin? no ?

## 2023-02-05 NOTE — Telephone Encounter (Signed)
Patient reports that she has had a ache or pressure on her heart since Sunday afternoon. She reports that her shoulder hurts as well. Inquired about her symptoms and advised she should go to ED for further evaluation. She absolutely refused the ED and would prefer to be seen. She pleaded for appointment to be seen here in the office but I did advise she may still be sent there given her symptoms. Opening was available today with Dr. Garen Lah and she was appreciative.

## 2023-02-05 NOTE — Patient Instructions (Addendum)
Medication Instructions:   START Toprol XL - take 0.5 tablet (12.5 mg) by mouth daily.   *If you need a refill on your cardiac medications before your next appointment, please call your pharmacy*   Lab Work:  Your physician recommends you go to the medical mall for lab work.  If you have labs (blood work) drawn today and your tests are completely normal, you will receive your results only by: [facility] (if you have MyChart) OR A paper copy in the mail If you have any lab test that is abnormal or we need to change your treatment, we will call you to review the results.   Testing/Procedures:    Your cardiac CT is scheduled on 02/28/2023 @ 12:30pm  [facility] [address] [place], [place] [address] [phone]  If scheduled at [facility] or [facility], please arrive 15 mins early for check-in and test prep.   Please follow these instructions carefully (unless otherwise directed):   On the Night Before the Test: Be sure to Drink plenty of water. Do not consume any caffeinated/decaffeinated beverages or chocolate 12 hours prior to your test. Do not take any antihistamines 12 hours prior to your test.  On the Day of the Test: Drink plenty of water until 1 hour prior to the test. Do not eat any food 1 hour prior to test. You may take your regular medications prior to the test.  Take metoprolol (Lopressor) two hours prior to test. Hydrochlorothiazide please HOLD on the morning of the test. FEMALES- please wear underwire-free bra if available, avoid dresses & tight clothing      After the Test: Drink plenty of water. After receiving IV contrast, you may experience a mild flushed feeling. This is normal. On occasion, you may experience a mild rash up to 24 hours after the test. This is not dangerous. If this occurs, you can take Benadryl 25 mg and increase your fluid  intake. If you experience trouble breathing, this can be serious. If it is severe call 911 IMMEDIATELY. If it is mild, please call our office.  Follow-Up: At [facility]: [facility], you and your health needs are our priority.  As part of our continuing mission to provide you with exceptional heart care, we have created designated Provider Care Teams.  These Care Teams include your primary Cardiologist (physician) and Advanced Practice Providers (APPs -  Physician Assistants and Nurse Practitioners) who all work together to provide you with the care you need, when you need it.  We recommend signing up for the patient portal called "MyChart".  Sign up information is provided on this After Visit Summary.  MyChart is used to connect with patients for Virtual Visits (Telemedicine).  Patients are able to view lab/test results, encounter notes, upcoming appointments, etc.  Non-urgent messages can be sent to your provider as well.   To learn more about what you can do with MyChart, go to [email or web address].    Your next appointment:    After testing  Provider:   Kate Sable, MD ONLY

## 2023-02-05 NOTE — Progress Notes (Signed)
Cardiology Office Note:    Date:  02/05/2023   ID:  Alexis Lyons, DOB 1941/10/17, MRN [id number]  PCP:  Virginia Crews, MD   [place]  Cardiologist:  Kate Sable, MD  Advanced Practice Provider:  No care team member to display Electrophysiologist:  Vickie Epley, MD       Referring MD: Virginia Crews, MD   Chief Complaint  Patient presents with   Follow-up    Chest pain, 3x days, left shoulder pain, pains come and go    History of Present Illness:    Alexis Lyons is a 82 y.o. female with a hx of paroxysmal atrial fibrillation s/p RFA 10/2021, hypertension, who presents due to chest pain.  Complains of chest pressure radiating to her left shoulder ongoing over the past 3 days.  Symptoms are not related with exertion.  She denies trauma, symptoms not reproducible with palpation.  Blood pressures have been elevated of late, restarted on HCTZ 12.5 mg daily 2 weeks ago by PCP.  BP has improved but still elevated with systolics in the 0000000 to [id number].  Was previously on Toprol-XL, developed hypotension, and syncope, all BP meds were stopped.  Tolerating Eliquis for A-fib, no bleeding issues.  Denies palpitations.  Prior notes Echocardiogram 04/2021 EF 60 to 65%, impaired relaxation. Cardiac monitor 08/2022 paroxysmal SVT.  No A-fib.   Past Medical History:  Diagnosis Date   Amnesia    Aphthae    Atrial fibrillation ([place])    Congestion-fibrosis syndrome    COVID 12/12/2022   DD (diverticular disease)    GERD (gastroesophageal reflux disease)    Seizures ([place]) 12/2017   passed out due to dehydration    Past Surgical History:  Procedure Laterality Date   ATRIAL FIBRILLATION ABLATION N/A 11/02/2021   Procedure: ATRIAL FIBRILLATION ABLATION;  Surgeon: Vickie Epley, MD;  Location: [facility];  Service: Cardiovascular;  Laterality: N/A;   CHOLECYSTECTOMY     colonoscopy with polypectomy     8/11 4 mm polyp, 12 mm  polyp   COLONOSCOPY WITH PROPOFOL N/A 02/24/2018   Procedure: COLONOSCOPY WITH PROPOFOL;  Surgeon: Manya Silvas, MD;  Location: [facility] ENDOSCOPY;  Service: Endoscopy;  Laterality: N/A;   HERNIA REPAIR Right 2004   inguinal    HERNIA REPAIR     x's 3    Current Medications: Current Meds  Medication Sig   apixaban (ELIQUIS) 2.5 MG TABS tablet Take 1 tablet (2.5 mg total) by mouth 2 (two) times daily.   Cyanocobalamin (B-12 PO) Take by mouth daily.   hydrochlorothiazide (HYDRODIURIL) 12.5 MG tablet Take 1 tablet (12.5 mg total) by mouth daily.   metoprolol succinate (TOPROL XL) 25 MG 24 hr tablet Take 0.5 tablets (12.5 mg total) by mouth daily.   metoprolol tartrate (LOPRESSOR) 100 MG tablet Take 1 tablet (100 mg total) by mouth once for 1 dose. TWO HOURS PRIOR TO CARDIAC CTA   zolpidem (AMBIEN) 5 MG tablet Take 0.5 tablets (2.5 mg total) by mouth at bedtime as needed for sleep.     Allergies:   Codeine and Amlodipine   Social History   Socioeconomic History   Marital status: Divorced    Spouse name: Not on file   Number of children: 2   Years of education: 15   Highest education level: Not on file  Occupational History   Occupation: [employer] in [place] Use   Smoking status: Former    Packs/day:  0.50    Years: 15.00    Total pack years: 7.50    Types: Cigarettes    Quit date: 12/16/1985    Years since quitting: 37.1   Smokeless tobacco: Never  Vaping Use   Vaping Use: Never used  Substance and Sexual Activity   Alcohol use: Yes    Alcohol/week: 7.0 standard drinks of alcohol    Types: 7 Glasses of wine per week    Comment: glass of wine with dinner   Drug use: No   Sexual activity: Not Currently  Other Topics Concern   Not on file  Social History Narrative   Not on file   Social Determinants of Health   Financial Resource Strain: Not on file  Food Insecurity: Not on file  Transportation Needs: Not on file  Physical Activity: Not on file  Stress: Not  on file  Social Connections: Not on file     Family History: The patient's family history includes Alcohol abuse in her father; Alzheimer's disease in her mother; Breast cancer in her sister; Cancer in her father and sister.  ROS:   Please see the history of present illness.     All other systems reviewed and are negative.  EKGs/Labs/Other Studies Reviewed:    The following studies were reviewed today:   EKG:  EKG is ordered today.  EKG shows sinus bradycardia with sinus arrhythmia.  Recent Labs: 08/15/2022: B Natriuretic Peptide 93.2 08/16/2022: Magnesium 1.9; TSH 2.572 01/16/2023: ALT 20; BUN 20; Creatinine, Ser 0.78; Hemoglobin 14.7; Platelets 252; Potassium 3.4; Sodium 140  Recent Lipid Panel    Component Value Date/Time   CHOL 171 08/15/2022 1328   TRIG 114 08/15/2022 1328   HDL 66 08/15/2022 1328   CHOLHDL 2.6 08/15/2022 1328   LDLCALC 85 08/15/2022 1328     Risk Assessment/Calculations:      Physical Exam:    VS:  BP (!) 160/88 (BP Location: Left Arm, Patient Position: Sitting, Cuff Size: Normal)   Pulse 70   Ht 5' 3"$  (1.6 m)   Wt 119 lb (54 kg)   SpO2 98%   BMI 21.08 kg/m     Wt Readings from Last 3 Encounters:  02/05/23 119 lb (54 kg)  01/28/23 118 lb (53.5 kg)  01/24/23 118 lb (53.5 kg)     GEN:  Well nourished, well developed in no acute distress HEENT: Normal NECK: No JVD; No carotid bruits CARDIAC: RRR, no murmurs, rubs, gallops RESPIRATORY:  Clear to auscultation without rales, wheezing or rhonchi  ABDOMEN: Soft, non-tender, non-distended MUSCULOSKELETAL:  No edema; No deformity  SKIN: Warm and dry NEUROLOGIC:  Alert and oriented x 3 PSYCHIATRIC:  Normal affect   ASSESSMENT:    1. Chest pain of uncertain etiology   2. Paroxysmal atrial fibrillation (HCC)   3. Primary hypertension   4. Chest pain, unspecified type    PLAN:    In order of problems listed above:  Chest pain, echo with normal EF, get coronary CTA.  Start Toprol-XL 12.5  mg daily. paroxysmal atrial fibrillation, s/p ablation 10/2021.  CHA2DS2-VASc of 4 (age, htn, gender).  Continue Eliquis 2.5 mg twice daily.  Toprol-XL as above. Hypertension, BP elevated, chest pain as above.  Toprol-XL 12.5 mg daily, HCTZ 12.5 mg daily.   Follow-up after cardiac testing.   Medication Adjustments/Labs and Tests Ordered: Current medicines are reviewed at length with the patient today.  Concerns regarding medicines are outlined above.  Orders Placed This Encounter  Procedures   CT  CORONARY MORPH W/CTA COR W/SCORE W/CA W/CM &/OR WO/CM   Basic Metabolic Panel (BMET)   EKG 12-Lead    Meds ordered this encounter  Medications   metoprolol tartrate (LOPRESSOR) 100 MG tablet    Sig: Take 1 tablet (100 mg total) by mouth once for 1 dose. TWO HOURS PRIOR TO CARDIAC CTA    Dispense:  1 tablet    Refill:  0   metoprolol succinate (TOPROL XL) 25 MG 24 hr tablet    Sig: Take 0.5 tablets (12.5 mg total) by mouth daily.    Dispense:  45 tablet    Refill:  0     Patient Instructions  Medication Instructions:   START Toprol XL - take 0.5 tablet (12.5 mg) by mouth daily.   *If you need a refill on your cardiac medications before your next appointment, please call your pharmacy*   Lab Work:  Your physician recommends you go to the medical mall for lab work.  If you have labs (blood work) drawn today and your tests are completely normal, you will receive your results only by: [place] (if you have MyChart) OR A paper copy in the mail If you have any lab test that is abnormal or we need to change your treatment, we will call you to review the results.   Testing/Procedures:    Your cardiac CT is scheduled on 02/28/2023 @ 12:30pm  [facility] [address] [place], [place] [address] [phone]  If scheduled at [facility] or [facility], please arrive 15 mins early  for check-in and test prep.   Please follow these instructions carefully (unless otherwise directed):   On the Night Before the Test: Be sure to Drink plenty of water. Do not consume any caffeinated/decaffeinated beverages or chocolate 12 hours prior to your test. Do not take any antihistamines 12 hours prior to your test.  On the Day of the Test: Drink plenty of water until 1 hour prior to the test. Do not eat any food 1 hour prior to test. You may take your regular medications prior to the test.  Take metoprolol (Lopressor) two hours prior to test. Hydrochlorothiazide please HOLD on the morning of the test. FEMALES- please wear underwire-free bra if available, avoid dresses & tight clothing      After the Test: Drink plenty of water. After receiving IV contrast, you may experience a mild flushed feeling. This is normal. On occasion, you may experience a mild rash up to 24 hours after the test. This is not dangerous. If this occurs, you can take Benadryl 25 mg and increase your fluid intake. If you experience trouble breathing, this can be serious. If it is severe call 911 IMMEDIATELY. If it is mild, please call our office.  Follow-Up: At [facility], you and your health needs are our priority.  As part of our continuing mission to provide you with exceptional heart care, we have created designated Provider Care Teams.  These Care Teams include your primary Cardiologist (physician) and Advanced Practice Providers (APPs -  Physician Assistants and Nurse Practitioners) who all work together to provide you with the care you need, when you need it.  We recommend signing up for the patient portal called "MyChart".  Sign up information is provided on this After Visit Summary.  MyChart is used to connect with patients for Virtual Visits (Telemedicine).  Patients are able to view lab/test results, encounter notes, upcoming appointments,  etc.  Non-urgent messages can be sent to your  provider as well.   To learn more about what you can do with MyChart, go to [email or web address].    Your next appointment:    After testing  Provider:   Kate Sable, MD ONLY       Signed, Kate Sable, MD  02/05/2023 12:19 PM    [place]

## 2023-02-08 ENCOUNTER — Ambulatory Visit: Payer: HMO | Admitting: Family Medicine

## 2023-02-14 ENCOUNTER — Ambulatory Visit: Payer: PPO | Admitting: Cardiology

## 2023-02-14 ENCOUNTER — Telehealth: Payer: Self-pay | Admitting: Family Medicine

## 2023-02-14 DIAGNOSIS — L658 Other specified nonscarring hair loss: Secondary | ICD-10-CM | POA: Diagnosis not present

## 2023-02-14 DIAGNOSIS — L821 Other seborrheic keratosis: Secondary | ICD-10-CM | POA: Diagnosis not present

## 2023-02-14 DIAGNOSIS — L400 Psoriasis vulgaris: Secondary | ICD-10-CM | POA: Diagnosis not present

## 2023-02-14 DIAGNOSIS — D2371 Other benign neoplasm of skin of right lower limb, including hip: Secondary | ICD-10-CM | POA: Diagnosis not present

## 2023-02-14 NOTE — Telephone Encounter (Signed)
Informed Patient of completed Membership form by the provider & copy will be left up front for pick up.

## 2023-02-19 NOTE — Progress Notes (Signed)
GYNECOLOGY PROGRESS NOTE  Subjective:    Patient ID: Alexis Lyons, female    DOB: October 03, 1941, 82 y.o.   MRN: [id number]  HPI  Patient is a 82 y.o. G2P2 female who presents to establish care and has questions about a vaginal lesions used neosporin on it and the lesion has came back has healed but has had friction that's uncontrollable.     Past Medical History:  Diagnosis Date   Amnesia    Aphthae    Atrial fibrillation ([place])    Congestion-fibrosis syndrome    COVID 12/12/2022   DD (diverticular disease)    GERD (gastroesophageal reflux disease)    Seizures ([place]) 12/2017   passed out due to dehydration    Family History  Problem Relation Age of Onset   Alzheimer's disease Mother    Alcohol abuse Father    Cancer Father        liver cancer   Cancer Sister        breast   Breast cancer Sister     Past Surgical History:  Procedure Laterality Date   ATRIAL FIBRILLATION ABLATION N/A 11/02/2021   Procedure: ATRIAL FIBRILLATION ABLATION;  Surgeon: Vickie Epley, MD;  Location: [facility];  Service: Cardiovascular;  Laterality: N/A;   CHOLECYSTECTOMY     colonoscopy with polypectomy     8/11 4 mm polyp, 12 mm polyp   COLONOSCOPY WITH PROPOFOL N/A 02/24/2018   Procedure: COLONOSCOPY WITH PROPOFOL;  Surgeon: Manya Silvas, MD;  Location: [facility] ENDOSCOPY;  Service: Endoscopy;  Laterality: N/A;   HERNIA REPAIR Right 2004   inguinal    HERNIA REPAIR     x's 3    Social History   Socioeconomic History   Marital status: Divorced    Spouse name: Not on file   Number of children: 2   Years of education: 15   Highest education level: Not on file  Occupational History   Occupation: [employer] in [place] Use   Smoking status: Former    Packs/day: 0.50    Years: 15.00    Total pack years: 7.50    Types: Cigarettes    Quit date: 12/16/1985    Years since quitting: 37.2   Smokeless tobacco: Never  Vaping Use   Vaping Use: Never used   Substance and Sexual Activity   Alcohol use: Yes    Alcohol/week: 7.0 standard drinks of alcohol    Types: 7 Glasses of wine per week    Comment: glass of wine with dinner   Drug use: No   Sexual activity: Not Currently  Other Topics Concern   Not on file  Social History Narrative   Not on file   Social Determinants of Health   Financial Resource Strain: Not on file  Food Insecurity: Not on file  Transportation Needs: Not on file  Physical Activity: Not on file  Stress: Not on file  Social Connections: Not on file  Intimate Partner Violence: Not on file    Current Outpatient Medications on File Prior to Visit  Medication Sig Dispense Refill   apixaban (ELIQUIS) 2.5 MG TABS tablet Take 1 tablet (2.5 mg total) by mouth 2 (two) times daily. 60 tablet 5   Cyanocobalamin (B-12 PO) Take by mouth daily.     hydrochlorothiazide (HYDRODIURIL) 12.5 MG tablet Take 1 tablet (12.5 mg total) by mouth daily. 30 tablet 3   metoprolol succinate (TOPROL XL) 25 MG 24 hr tablet Take 0.5 tablets (12.5  mg total) by mouth daily. 45 tablet 0   zolpidem (AMBIEN) 5 MG tablet Take 0.5 tablets (2.5 mg total) by mouth at bedtime as needed for sleep. 15 tablet 3   metoprolol tartrate (LOPRESSOR) 100 MG tablet Take 1 tablet (100 mg total) by mouth once for 1 dose. TWO HOURS PRIOR TO CARDIAC CTA 1 tablet 0   No current facility-administered medications on file prior to visit.    Allergies  Allergen Reactions   Codeine Other (See Comments)    Messed up head really bad   Amlodipine Palpitations and Other (See Comments)    Developed flushed feeling in her face, dizziness     Review of Systems Pertinent items are noted in HPI.   Objective:   Blood pressure 137/78, pulse 67, height '5\' 3"'$  (1.6 m), weight 121 lb (54.9 kg). Body mass index is 21.43 kg/m. General appearance: alert, cooperative, and no distress Abdomen: {abdominal exam:16834} Pelvic: {pelvic exam:16852::"cervix normal in  appearance","external genitalia normal","no adnexal masses or tenderness","no cervical motion tenderness","rectovaginal septum normal","uterus normal size, shape, and consistency","vagina normal without discharge"} Extremities: {extremity exam:5109} Neurologic: {neuro exam:17854}   Assessment:   No diagnosis found.   Plan:   There are no diagnoses linked to this encounter.

## 2023-02-20 ENCOUNTER — Ambulatory Visit (INDEPENDENT_AMBULATORY_CARE_PROVIDER_SITE_OTHER): Payer: HMO | Admitting: Obstetrics and Gynecology

## 2023-02-20 ENCOUNTER — Encounter: Payer: Self-pay | Admitting: Obstetrics and Gynecology

## 2023-02-20 VITALS — BP 137/78 | HR 67 | Ht 63.0 in | Wt 121.0 lb

## 2023-02-20 DIAGNOSIS — S30814A Abrasion of vagina and vulva, initial encounter: Secondary | ICD-10-CM

## 2023-02-20 DIAGNOSIS — N952 Postmenopausal atrophic vaginitis: Secondary | ICD-10-CM | POA: Diagnosis not present

## 2023-02-20 MED ORDER — PREMARIN 0.625 MG/GM VA CREA: 1.0000 | TOPICAL_CREAM | VAGINAL | 2 refills | Status: AC

## 2023-02-20 NOTE — Progress Notes (Incomplete)
GYNECOLOGY PROGRESS NOTE  Subjective:    Patient ID: Alexis Lyons, female    DOB: [birth year], 82 y.o.   MRN: [id number]  HPI  Patient is a 82 y.o. G2P2 female who presents to establish care and has concerns about a vaginal lesion.  She reports that she experienced  a small bump in her vagina ~ 1.5 months ago.  Tried using some Neosporin which helped it resolve.  However after several weeks she thinks it returned. Has been noting some irritation in the vaginal area, especially when wearing jeans.  Has tried Neosporin again but this time it has not helped.     Past Medical History:  Diagnosis Date  . Amnesia   . Aphthae   . Atrial fibrillation ([place])   . Congestion-fibrosis syndrome   . COVID 12/12/2022  . DD (diverticular disease)   . GERD (gastroesophageal reflux disease)   . Seizures ([place]) 12/2017   passed out due to dehydration    Family History  Problem Relation Age of Onset  . Alzheimer's disease Mother   . Alcohol abuse Father   . Cancer Father        liver cancer  . Cancer Sister        breast  . Breast cancer Sister     Past Surgical History:  Procedure Laterality Date  . ATRIAL FIBRILLATION ABLATION N/A 11/02/2021   Procedure: ATRIAL FIBRILLATION ABLATION;  Surgeon: Vickie Epley, MD;  Location: [facility];  Service: Cardiovascular;  Laterality: N/A;  . CHOLECYSTECTOMY    . colonoscopy with polypectomy     8/11 4 mm polyp, 12 mm polyp  . COLONOSCOPY WITH PROPOFOL N/A 02/24/2018   Procedure: COLONOSCOPY WITH PROPOFOL;  Surgeon: Manya Silvas, MD;  Location: [facility] ENDOSCOPY;  Service: Endoscopy;  Laterality: N/A;  . HERNIA REPAIR Right 2004   inguinal   . HERNIA REPAIR     x's 3    Social History   Socioeconomic History  . Marital status: Divorced    Spouse name: Not on file  . Number of children: 2  . Years of education: 62  . Highest education level: Not on file  Occupational History  . Occupation: [employer] in [place]  Use  . Smoking status: Former    Packs/day: 0.50    Years: 15.00    Total pack years: 7.50    Types: Cigarettes    Quit date: 12/16/1985    Years since quitting: 37.2  . Smokeless tobacco: Never  Vaping Use  . Vaping Use: Never used  Substance and Sexual Activity  . Alcohol use: Yes    Alcohol/week: 7.0 standard drinks of alcohol    Types: 7 Glasses of wine per week    Comment: glass of wine with dinner  . Drug use: No  . Sexual activity: Not Currently  Other Topics Concern  . Not on file  Social History Narrative  . Not on file   Social Determinants of Health   Financial Resource Strain: Not on file  Food Insecurity: Not on file  Transportation Needs: Not on file  Physical Activity: Not on file  Stress: Not on file  Social Connections: Not on file  Intimate Partner Violence: Not on file    Current Outpatient Medications on File Prior to Visit  Medication Sig Dispense Refill  . apixaban (ELIQUIS) 2.5 MG TABS tablet Take 1 tablet (2.5 mg total) by mouth 2 (two) times daily. 60 tablet 5  . Cyanocobalamin (  B-12 PO) Take by mouth daily.    . hydrochlorothiazide (HYDRODIURIL) 12.5 MG tablet Take 1 tablet (12.5 mg total) by mouth daily. 30 tablet 3  . metoprolol succinate (TOPROL XL) 25 MG 24 hr tablet Take 0.5 tablets (12.5 mg total) by mouth daily. 45 tablet 0  . zolpidem (AMBIEN) 5 MG tablet Take 0.5 tablets (2.5 mg total) by mouth at bedtime as needed for sleep. 15 tablet 3  . metoprolol tartrate (LOPRESSOR) 100 MG tablet Take 1 tablet (100 mg total) by mouth once for 1 dose. TWO HOURS PRIOR TO CARDIAC CTA 1 tablet 0   No current facility-administered medications on file prior to visit.    Allergies  Allergen Reactions  . Codeine Other (See Comments)    Messed up head really bad  . Amlodipine Palpitations and Other (See Comments)    Developed flushed feeling in her face, dizziness     Review of Systems Pertinent items are noted in HPI.   Objective:   Blood  pressure 137/78, pulse 67, height '5\' 3"'$  (1.6 m), weight 121 lb (54.9 kg). Body mass index is 21.43 kg/m. General appearance: alert, cooperative, and no distress Abdomen: soft, non-tender; bowel sounds normal; no masses,  no organomegaly Pelvic: external genitalia normal, small abrasion orectovaginal septum normal.  Vagina without discharge.  Cervix normal appearing, no lesions and no motion tenderness.  Uterus mobile, nontender, normal shape and size.  Adnexae non-palpable, nontender bilaterally.  Extremities: extremities normal, atraumatic, no cyanosis or edema Neurologic: Grossly normal   Assessment:   No diagnosis found.   Plan:   There are no diagnoses linked to this encounter.

## 2023-02-21 ENCOUNTER — Encounter: Payer: Self-pay | Admitting: Obstetrics and Gynecology

## 2023-02-27 ENCOUNTER — Telehealth (HOSPITAL_COMMUNITY): Payer: Self-pay | Admitting: *Deleted

## 2023-02-27 NOTE — Telephone Encounter (Signed)
Attempted to call patient regarding upcoming cardiac CT appointment. °Left message on voicemail with name and callback number ° °Cella Cappello RN Navigator Cardiac Imaging °[facility] °[phone] Office °[phone] Cell ° °

## 2023-02-28 ENCOUNTER — Ambulatory Visit
Admission: RE | Admit: 2023-02-28 | Discharge: 2023-02-28 | Disposition: A | Discharge status: Home / Self Care | Payer: HMO | Source: Ambulatory Visit | Attending: Cardiology | Admitting: Cardiology

## 2023-02-28 DIAGNOSIS — I2510 Atherosclerotic heart disease of native coronary artery without angina pectoris: Secondary | ICD-10-CM | POA: Insufficient documentation

## 2023-02-28 DIAGNOSIS — R079 Chest pain, unspecified: Secondary | ICD-10-CM | POA: Insufficient documentation

## 2023-02-28 DIAGNOSIS — R072 Precordial pain: Secondary | ICD-10-CM | POA: Diagnosis not present

## 2023-02-28 MED ORDER — NITROGLYCERIN 0.4 MG SL SUBL
0.8000 mg | SUBLINGUAL_TABLET | Freq: Once | SUBLINGUAL | Status: AC
  Administered 2023-02-28: 0.8 mg via SUBLINGUAL

## 2023-02-28 MED ORDER — IOHEXOL 350 MG/ML SOLN
75.0000 mL | Freq: Once | INTRAVENOUS | Status: AC | PRN
  Administered 2023-02-28: 75 mL via INTRAVENOUS

## 2023-02-28 MED ORDER — SODIUM CHLORIDE 0.9 % IV BOLUS
250.0000 mL | Freq: Once | INTRAVENOUS | Status: AC
  Administered 2023-02-28: 250 mL via INTRAVENOUS

## 2023-02-28 NOTE — Progress Notes (Signed)
Pt tolerated procedure well. Pt ABC intact. Pt given water and coffee. Pt encouraged to drink plenty throughout the day. Pt ambulatory with steady gait.

## 2023-03-11 ENCOUNTER — Ambulatory Visit: Payer: HMO | Attending: Cardiology | Admitting: Cardiology

## 2023-03-11 ENCOUNTER — Encounter: Payer: Self-pay | Admitting: Cardiology

## 2023-03-11 VITALS — BP 142/82 | HR 59 | Ht 63.0 in | Wt 122.0 lb

## 2023-03-11 DIAGNOSIS — I2584 Coronary atherosclerosis due to calcified coronary lesion: Secondary | ICD-10-CM

## 2023-03-11 DIAGNOSIS — I2510 Atherosclerotic heart disease of native coronary artery without angina pectoris: Secondary | ICD-10-CM | POA: Diagnosis not present

## 2023-03-11 DIAGNOSIS — I480 Paroxysmal atrial fibrillation: Secondary | ICD-10-CM | POA: Diagnosis not present

## 2023-03-11 DIAGNOSIS — I10 Essential (primary) hypertension: Secondary | ICD-10-CM | POA: Diagnosis not present

## 2023-03-11 NOTE — Patient Instructions (Signed)
Medication Instructions:   1., Your physician recommends that you continue on your current medications as directed. Please refer to the Current Medication list given to you today. *If you need a refill on your cardiac medications before your next appointment, please call your pharmacy*   Lab Work:  None Ordered  If you have labs (blood work) drawn today and your tests are completely normal, you will receive your results only by: [place] (if you have MyChart) OR A paper copy in the mail If you have any lab test that is abnormal or we need to change your treatment, we will call you to review the results.   Testing/Procedures:  1., None ordered   Follow-Up: At [facility], you and your health needs are our priority.  As part of our continuing mission to provide you with exceptional heart care, we have created designated Provider Care Teams.  These Care Teams include your primary Cardiologist (physician) and Advanced Practice Providers (APPs -  Physician Assistants and Nurse Practitioners) who all work together to provide you with the care you need, when you need it.  We recommend signing up for the patient portal called "MyChart".  Sign up information is provided on this After Visit Summary.  MyChart is used to connect with patients for Virtual Visits (Telemedicine).  Patients are able to view lab/test results, encounter notes, upcoming appointments, etc.  Non-urgent messages can be sent to your provider as well.   To learn more about what you can do with MyChart, go to [email or web address].    Your next appointment:   12 month(s)  Provider:   You may see Kate Sable, MD or one of the following Advanced Practice Providers on your designated Care Team:   Murray Hodgkins, NP Christell Faith, PA-C Cadence Kathlen Mody, PA-C Gerrie Nordmann, NP

## 2023-03-11 NOTE — Progress Notes (Signed)
Cardiology Office Note:    Date:  03/11/2023   ID:  Alexis Lyons, DOB February 24, 1941, MRN [id number]  PCP:  Virginia Crews, MD   [place]  Cardiologist:  Kate Sable, MD  Advanced Practice Provider:  No care team member to display Electrophysiologist:  Vickie Epley, MD       Referring MD: Virginia Crews, MD   Chief Complaint  Patient presents with   Follow-up    5-6 month F/U, no new cardiac concerns     History of Present Illness:    Alexis Lyons is a 82 y.o. female with a hx of paroxysmal atrial fibrillation s/p RFA 10/2021, hypertension, who presents for follow-up.  Previously seen due to chest pain.  Coronary CT was obtained to evaluate presence of CAD.  Blood pressure was previously elevated.  Started on Toprol-XL for antianginal and BP benefit.  States BP at home is usually controlled in the low 100s.  Has not taken BP meds this morning yet.  Overall feels well, denies any new concerns at this time.  No bleeding issues with Eliquis.  Prior notes Echocardiogram 04/2021 EF 60 to 65%, impaired relaxation. Cardiac monitor 08/2022 paroxysmal SVT.  No A-fib.   Past Medical History:  Diagnosis Date   Amnesia    Aphthae    Atrial fibrillation ([place])    Congestion-fibrosis syndrome    COVID 12/12/2022   DD (diverticular disease)    GERD (gastroesophageal reflux disease)    Seizures ([place]) 12/2017   passed out due to dehydration    Past Surgical History:  Procedure Laterality Date   ATRIAL FIBRILLATION ABLATION N/A 11/02/2021   Procedure: ATRIAL FIBRILLATION ABLATION;  Surgeon: Vickie Epley, MD;  Location: [place] [facility];  Service: Cardiovascular;  Laterality: N/A;   CHOLECYSTECTOMY     colonoscopy with polypectomy     8/11 4 mm polyp, 12 mm polyp   COLONOSCOPY WITH PROPOFOL N/A 02/24/2018   Procedure: COLONOSCOPY WITH PROPOFOL;  Surgeon: Manya Silvas, MD;  Location: [facility] END[facility]COPY;  Service: Endoscopy;   Laterality: N/A;   HERNIA REPAIR Right 2004   inguinal    HERNIA REPAIR     x's 3    Current Medications: Current Meds  Medication Sig   apixaban (ELIQUIS) 2.5 MG TABS tablet Take 1 tablet (2.5 mg total) by mouth 2 (two) times daily.   conjugated estrogens (PREMARIN) vaginal cream Place 1 Applicatorful vaginally 2 (two) times a week.   Cyanocobalamin (B-12 PO) Take by mouth daily.   hydrochlorothiazide (HYDRODIURIL) 12.5 MG tablet Take 1 tablet (12.5 mg total) by mouth daily.   metoprolol succinate (TOPROL XL) 25 MG 24 hr tablet Take 0.5 tablets (12.5 mg total) by mouth daily.   zolpidem (AMBIEN) 5 MG tablet Take 0.5 tablets (2.5 mg total) by mouth at bedtime as needed for sleep.     Allergies:   Codeine and Amlodipine   Social History   Socioeconomic History   Marital status: Divorced    Spouse name: Not on file   Number of children: 2   Years of education: 15   Highest education level: Not on file  Occupational History   Occupation: [employer] in [place] Use   Smoking status: Former    Packs/day: 0.50    Years: 15.00    Additional pack years: 0.00    Total pack years: 7.50    Types: Cigarettes    Quit date: 12/16/1985    Years  since quitting: 37.2   Smokeless tobacco: Never  Vaping Use   Vaping Use: Never used  Substance and Sexual Activity   Alcohol use: Yes    Alcohol/week: 7.0 standard drinks of alcohol    Types: 7 Glasses of wine per week    Comment: glass of wine with dinner   Drug use: No   Sexual activity: Not Currently  Other Topics Concern   Not on file  Social History Narrative   Not on file   Social Determinants of Health   Financial Resource Strain: Not on file  Food Insecurity: Not on file  Transportation Needs: Not on file  Physical Activity: Not on file  Stress: Not on file  Social Connections: Not on file     Family History: The patient's family history includes Alcohol abuse in her father; Alzheimer's disease in her mother;  Breast cancer in her sister; Cancer in her father and sister.  ROS:   Please see the history of present illness.     All other systems reviewed and are negative.  EKGs/Labs/Other Studies Reviewed:    The following studies were reviewed today:   EKG:  EKG is ordered today.  EKG shows sinus bradycardia , heart rate 59  Recent Labs: 08/15/2022: B Natriuretic Peptide 93.2 08/16/2022: Magnesium 1.9; TSH 2.572 01/16/2023: ALT 20; Hemoglobin 14.7; Platelets 252 02/05/2023: BUN 16; Creatinine, Ser 0.84; Potassium 3.6; Sodium 140  Recent Lipid Panel    Component Value Date/Time   CHOL 171 08/15/2022 1328   TRIG 114 08/15/2022 1328   HDL 66 08/15/2022 1328   CHOLHDL 2.6 08/15/2022 1328   LDLCALC 85 08/15/2022 1328     Risk Assessment/Calculations:      Physical Exam:    VS:  BP (!) 142/82 (BP Location: Left Arm)   Pulse (!) 59   Ht 5\' 3"  (1.6 m)   Wt 122 lb (55.3 kg)   SpO2 97%   BMI 21.61 kg/m     Wt Readings from Last 3 Encounters:  03/11/23 122 lb (55.3 kg)  02/20/23 121 lb (54.9 kg)  02/05/23 119 lb (54 kg)     GEN:  Well nourished, well developed in no acute distress HEENT: Normal NECK: No JVD; No carotid bruits CARDIAC: RRR, no murmurs, rubs, gallops RESPIRATORY:  Clear to auscultation without rales, wheezing or rhonchi  ABDOMEN: Soft, non-tender, non-distended MUSCULOSKELETAL:  No edema; No deformity  SKIN: Warm and dry NEUROLOGIC:  Alert and oriented x 3 PSYCHIATRIC:  Normal affect   ASSESSMENT:    1. Coronary artery calcification   2. Paroxysmal atrial fibrillation (HCC)   3. Primary hypertension    PLAN:    In order of problems listed above:  Chest pain, echo with normal EF, coronary CTA shows minimal nonobstructive LAD calcification, calcium score 9.4. paroxysmal atrial fibrillation, s/p ablation 10/2021.  CHA2DS2-VASc of 4 (age, htn, gender).  Continue Eliquis 2.5 mg twice daily.  Toprol-XL  Hypertension, BP elevated, but reasonable for age.   Continue Toprol-XL 12.5 mg daily, HCTZ 12.5 mg daily.   Follow-up in 1 year.   Medication Adjustments/Labs and Tests Ordered: Current medicines are reviewed at length with the patient today.  Concerns regarding medicines are outlined above.  Orders Placed This Encounter  Procedures   EKG 12-Lead    No orders of the defined types were placed in this encounter.    Patient Instructions  Medication Instructions:   1., Your physician recommends that you continue on your current medications as directed. Please  refer to the Current Medication list given to you today. *If you need a refill on your cardiac medications before your next appointment, please call your pharmacy*   Lab Work:  None Ordered  If you have labs (blood work) drawn today and your tests are completely normal, you will receive your results only by: [place] (if you have MyChart) OR A paper copy in the mail If you have any lab test that is abnormal or we need to change your treatment, we will call you to review the results.   Testing/Procedures:  1., None ordered   Follow-Up: At [facility], you and your health needs are our priority.  As part of our continuing mission to provide you with exceptional heart care, we have created designated Provider Care Teams.  These Care Teams include your primary Cardiologist (physician) and Advanced Practice Providers (APPs -  Physician Assistants and Nurse Practitioners) who all work together to provide you with the care you need, when you need it.  We recommend signing up for the patient portal called "MyChart".  Sign up information is provided on this After Visit Summary.  MyChart is used to connect with patients for Virtual Visits (Telemedicine).  Patients are able to view lab/test results, encounter notes, upcoming appointments, etc.  Non-urgent messages can be sent to your provider as well.   To learn more about what you can do with MyChart, go to  [email or web address].    Your next appointment:   12 month(s)  Provider:   You may see Kate Sable, MD or one of the following Advanced Practice Providers on your designated Care Team:   Murray Hodgkins, NP Christell Faith, PA-C Cadence Kathlen Mody, PA-C Gerrie Nordmann, NP    Signed, Kate Sable, MD  03/11/2023 12:14 PM    [place]

## 2023-03-29 ENCOUNTER — Telehealth: Payer: Self-pay

## 2023-03-29 NOTE — Telephone Encounter (Signed)
Unable to close message.

## 2023-04-04 ENCOUNTER — Ambulatory Visit (INDEPENDENT_AMBULATORY_CARE_PROVIDER_SITE_OTHER): Payer: HMO | Admitting: Family Medicine

## 2023-04-04 VITALS — BP 122/84 | HR 65 | Ht 63.0 in | Wt 119.3 lb

## 2023-04-04 DIAGNOSIS — Z1231 Encounter for screening mammogram for malignant neoplasm of breast: Secondary | ICD-10-CM

## 2023-04-04 DIAGNOSIS — I10 Essential (primary) hypertension: Secondary | ICD-10-CM

## 2023-04-04 DIAGNOSIS — Z0000 Encounter for general adult medical examination without abnormal findings: Secondary | ICD-10-CM

## 2023-04-04 DIAGNOSIS — E7800 Pure hypercholesterolemia, unspecified: Secondary | ICD-10-CM

## 2023-04-04 NOTE — Progress Notes (Signed)
I,Sha'taria Tyson,acting as a Neurosurgeon for Shirlee Latch, MD.,have documented all relevant documentation on the behalf of Shirlee Latch, MD,as directed by  Shirlee Latch, MD while in the presence of Shirlee Latch, MD.   Annual Wellness Visit     Patient: Alexis Lyons, Female    DOB: Jun 30, 1941, 82 y.o.   MRN: [id number] Visit Date: 04/04/2023  Today's Provider: Shirlee Latch, MD   No chief complaint on file.  Subjective    Alexis Lyons is a 82 y.o. female who presents today for her Annual Wellness Visit. She reports consuming a low sodium diet.  The patient reports exercising daily walking for 45 minutes in the mornings and again at night and some days she will also do pilate's for 45 minutes.  She generally feels well. She reports sleeping well. She does not have additional problems to discuss today.   HPI  -Dexa Scan: declined Declines labs today - will do at next visit  Medications: Outpatient Medications Prior to Visit  Medication Sig   apixaban (ELIQUIS) 2.5 MG TABS tablet Take 1 tablet (2.5 mg total) by mouth 2 (two) times daily.   conjugated estrogens (PREMARIN) vaginal cream Place 1 Applicatorful vaginally 2 (two) times a week.   Cyanocobalamin (B-12 PO) Take by mouth daily.   hydrochlorothiazide (HYDRODIURIL) 12.5 MG tablet Take 1 tablet (12.5 mg total) by mouth daily.   metoprolol succinate (TOPROL XL) 25 MG 24 hr tablet Take 0.5 tablets (12.5 mg total) by mouth daily.   zolpidem (AMBIEN) 5 MG tablet Take 0.5 tablets (2.5 mg total) by mouth at bedtime as needed for sleep.   No facility-administered medications prior to visit.    Allergies  Allergen Reactions   Codeine Other (See Comments)    Messed up head really bad   Amlodipine Palpitations and Other (See Comments)    Developed flushed feeling in her face, dizziness    Patient Care Team: Erasmo Downer, MD as PCP - General (Family Medicine) Debbe Odea, MD as PCP -  Cardiology (Cardiology) Lanier Prude, MD as PCP - Electrophysiology (Cardiology)  Review of Systems  All other systems reviewed and are negative.       Objective    Vitals: BP 122/84 (BP Location: Left Arm, Patient Position: Sitting, Cuff Size: Normal)   Pulse 65   Ht  (1.6 m)   Wt 119 lb 4.8 oz (54.1 kg)   SpO2 100%   BMI 21.13 kg/m     Physical Exam Vitals reviewed.  Constitutional:      General: She is not in acute distress.    Appearance: Normal appearance. She is well-developed. She is not diaphoretic.  HENT:     Head: Normocephalic and atraumatic.     Right Ear: Tympanic membrane, ear canal and external ear normal.     Left Ear: Tympanic membrane, ear canal and external ear normal.     Nose: Nose normal.     Mouth/Throat:     Mouth: Mucous membranes are moist.     Pharynx: Oropharynx is clear. No oropharyngeal exudate.  Eyes:     General: No scleral icterus.    Conjunctiva/sclera: Conjunctivae normal.     Pupils: Pupils are equal, round, and reactive to light.  Neck:     Thyroid: No thyromegaly.  Cardiovascular:     Rate and Rhythm: Normal rate and regular rhythm.     Pulses: Normal pulses.     Heart sounds: Normal heart sounds. No murmur heard.  Pulmonary:     Effort: Pulmonary effort is normal. No respiratory distress.     Breath sounds: Normal breath sounds. No wheezing or rales.  Abdominal:     General: There is no distension.     Palpations: Abdomen is soft.     Tenderness: There is no abdominal tenderness.  Musculoskeletal:        General: No deformity.     Cervical back: Neck supple.     Right lower leg: No edema.     Left lower leg: No edema.  Lymphadenopathy:     Cervical: No cervical adenopathy.  Skin:    General: Skin is warm and dry.     Findings: No rash.  Neurological:     Mental Status: She is alert and oriented to person, place, and time. Mental status is at baseline.     Gait: Gait normal.  Psychiatric:        Mood and  Affect: Mood normal.        Behavior: Behavior normal.        Thought Content: Thought content normal.      Most recent functional status assessment:    04/04/2023   11:11 AM  In your present state of health, do you have any difficulty performing the following activities:  Hearing? 0  Vision? 0  Difficulty concentrating or making decisions? 0  Walking or climbing stairs? 0  Dressing or bathing? 0  Doing errands, shopping? 0   Most recent fall risk assessment:    04/04/2023   11:08 AM  Fall Risk   Falls in the past year? 0  Number falls in past yr: 0  Injury with Fall? 0  Risk for fall due to : No Fall Risks  Follow up Falls evaluation completed    Most recent depression screenings:    04/04/2023   11:10 AM 01/17/2023    2:20 PM  PHQ 2/9 Scores  PHQ - 2 Score 0 0  PHQ- 9 Score 0    Most recent cognitive screening:    01/11/2022   10:43 AM  6CIT Screen  What Year? 0 points  What month? 0 points  What time? 0 points  Count back from 20 0 points  Months in reverse 0 points  Repeat phrase 0 points  Total Score 0 points   Most recent Audit-C alcohol use screening    04/04/2023   11:08 AM  Alcohol Use Disorder Test (AUDIT)  1. How often do you have a drink containing alcohol? 1  2. How many drinks containing alcohol do you have on a typical day when you are drinking? 0  3. How often do you have six or more drinks on one occasion? 0  AUDIT-C Score 1   A score of 3 or more in women, and 4 or more in men indicates increased risk for alcohol abuse, EXCEPT if all of the points are from question 1   No results found for any visits on 04/04/23.  Assessment & Plan     Annual wellness visit done today including the all of the following: Reviewed patient's Family Medical History Reviewed and updated list of patient's medical providers Assessment of cognitive impairment was done Assessed patient's functional ability Established a written schedule for health screening  services Health Risk Assessent Completed and Reviewed  Exercise Activities and Dietary recommendations  Goals   None     Immunization History  Administered Date(s) Administered   Hepatitis A 07/26/2015   PFIZER(Purple Top)SARS-COV-2 Vaccination  01/06/2020, 01/25/2020, 12/28/2020   Pneumococcal Conjugate-13 07/29/2019   Pneumococcal Polysaccharide-23 01/01/2011   Td 06/23/2008   Tdap 11/30/2019   Typhoid Inactivated 07/26/2015   Zoster Recombinat (Shingrix) 06/25/2019, 10/06/2019    Health Maintenance  Topic Date Due   DEXA SCAN  07/02/2013   COVID-19 Vaccine (4 - 2023-24 season) 08/17/2022   INFLUENZA VACCINE  07/18/2023   Medicare Annual Wellness (AWV)  04/03/2024   DTaP/Tdap/Td (3 - Td or Tdap) 11/29/2029   Pneumonia Vaccine 47+ Years old  Completed   Zoster Vaccines- Shingrix  Completed   HPV VACCINES  Aged Out     Discussed health benefits of physical activity, and encouraged her to engage in regular exercise appropriate for her age and condition.    Problem List Items Addressed This Visit       Cardiovascular and Mediastinum   Essential hypertension    Well controlled Continue current medications Reviewed metabolic panel F/u in 6 months         Other   Elevated LDL cholesterol level   Other Visit Diagnoses     Encounter for annual wellness visit (AWV) in Medicare patient    -  Primary   Encounter for annual physical exam       Breast cancer screening by mammogram       Relevant Orders   MM 3D SCREENING MAMMOGRAM BILATERAL BREAST        Return in about 6 months (around 10/04/2023) for chronic disease f/u.     I, Shirlee Latch, MD, have reviewed all documentation for this visit. The documentation on 04/04/23 for the exam, diagnosis, procedures, and orders are all accurate and complete.   Madelline Eshbach, Marzella Schlein, MD, MPH [facility]

## 2023-04-04 NOTE — Assessment & Plan Note (Signed)
Well controlled Continue current medications Reviewed metabolic panel F/u in 6 months  

## 2023-04-08 ENCOUNTER — Telehealth: Payer: Self-pay

## 2023-04-08 NOTE — Telephone Encounter (Signed)
Alexis Lyons called triage stating that her Premarin with insurance will be 200$. Patient is coming by to pick up two samples. I asked her if she would like for Dr. Valentino Saxon to call in a different brand and the question was ignored.

## 2023-05-07 ENCOUNTER — Other Ambulatory Visit: Payer: Self-pay | Admitting: Family Medicine

## 2023-05-07 ENCOUNTER — Other Ambulatory Visit: Payer: Self-pay

## 2023-05-07 MED ORDER — METOPROLOL SUCCINATE ER 25 MG PO TB24: 12.5000 mg | ORAL_TABLET | Freq: Every day | ORAL | 3 refills | Status: DC

## 2023-05-10 ENCOUNTER — Ambulatory Visit
Admission: RE | Admit: 2023-05-10 | Discharge: 2023-05-10 | Disposition: A | Discharge status: Home / Self Care | Payer: HMO | Source: Ambulatory Visit | Attending: Family Medicine | Admitting: Family Medicine

## 2023-05-10 DIAGNOSIS — Z1231 Encounter for screening mammogram for malignant neoplasm of breast: Secondary | ICD-10-CM | POA: Diagnosis not present

## 2023-05-21 ENCOUNTER — Other Ambulatory Visit: Payer: Self-pay

## 2023-05-21 MED ORDER — APIXABAN 2.5 MG PO TABS: 2.5000 mg | ORAL_TABLET | Freq: Two times a day (BID) | ORAL | 5 refills | Status: DC

## 2023-05-21 NOTE — Telephone Encounter (Signed)
Prescription refill request for Eliquis received. Indication:afib Last office visit:3/24 Scr:0.84  2/24 Age: 82 Weight:54.1  kg  Prescription refilled

## 2023-07-01 ENCOUNTER — Telehealth: Payer: Self-pay

## 2023-07-01 NOTE — Telephone Encounter (Signed)
Left vm with the information about the counselors.

## 2023-07-01 NOTE — Telephone Encounter (Signed)
Copied from CRM [id number]. Topic: General - Other >> Jul 01, 2023  9:48 AM Lennox Pippins wrote: Patient called and stated that Dr. B had recommended her to two family counselors, this has been a while ago and patient lost this information, and would like to know who Dr. B recommended? Please advise.  Patients callback # [phone]

## 2023-07-01 NOTE — Telephone Encounter (Signed)
I told her about [facility] and [facility] - both in [place]

## 2023-07-11 ENCOUNTER — Encounter: Payer: Self-pay | Admitting: Family Medicine

## 2023-07-11 ENCOUNTER — Ambulatory Visit (INDEPENDENT_AMBULATORY_CARE_PROVIDER_SITE_OTHER): Payer: HMO | Admitting: Family Medicine

## 2023-07-11 VITALS — BP 120/64 | HR 56 | Temp 97.7°F | Ht 62.99 in | Wt 123.1 lb

## 2023-07-11 DIAGNOSIS — R140 Abdominal distension (gaseous): Secondary | ICD-10-CM | POA: Diagnosis not present

## 2023-07-11 DIAGNOSIS — K5900 Constipation, unspecified: Secondary | ICD-10-CM

## 2023-07-11 NOTE — Progress Notes (Signed)
Established Patient Office Visit  Subjective   Patient ID: Alexis Lyons, female    DOB: 1941-07-27  Age: 82 y.o. MRN: [id number]  Chief Complaint  Patient presents with   [facility]    Stomach hurts and is sore. Started about a month ago.     HPI  Discussed the use of AI scribe software for clinical note transcription with the patient, who gave verbal consent to proceed.  History of Present Illness   The patient presents with a month-long history of abdominal discomfort and altered bowel habits. They report an episode of acute gastroenteritis a month ago, characterized by vomiting and diarrhea, which occurred after eating out with friends. Since then, they have experienced persistent bloating, belching, and a sensation of being full of gas. They deny any specific points of tenderness in the abdomen, reflux, or pain.  The patient's bowel movements have not returned to normal, with alternating episodes of diarrhea and constipation. They describe the stools as sometimes being small and hard. They deny any visible blood in the stool or on toilet paper.  The patient reports feeling bloated and uncomfortable after eating, regardless of the type of food consumed. They have not tried any supplements or medications for their symptoms. They maintain an active lifestyle, including regular exercise and walking.  The patient also reports a change in their physical appearance due to the bloating, with their abdomen appearing rounded and distended, leading them to wear loose clothing to hide their abdomen.         ROS per HPI    Objective:     BP 120/64   Pulse (!) 56   Temp 97.7 F (36.5 C) (Oral)   Ht 5' 2.99" (1.6 m)   Wt 123 lb 1.6 oz (55.8 kg)   SpO2 98%   BMI 21.81 kg/m    Physical Exam Vitals reviewed.  Constitutional:      General: She is not in acute distress.    Appearance: Normal appearance. She is well-developed. She is not diaphoretic.  HENT:     Head: Normocephalic  and atraumatic.  Eyes:     General: No scleral icterus.    Conjunctiva/sclera: Conjunctivae normal.  Neck:     Thyroid: No thyromegaly.  Cardiovascular:     Rate and Rhythm: Normal rate and regular rhythm.     Heart sounds: Normal heart sounds. No murmur heard. Pulmonary:     Effort: Pulmonary effort is normal. No respiratory distress.     Breath sounds: Normal breath sounds. No wheezing, rhonchi or rales.  Abdominal:     General: Bowel sounds are normal. There is no distension.     Palpations: Abdomen is soft. There is no mass.     Tenderness: There is no abdominal tenderness. There is no guarding or rebound.  Musculoskeletal:     Cervical back: Neck supple.     Right lower leg: No edema.     Left lower leg: No edema.  Lymphadenopathy:     Cervical: No cervical adenopathy.  Skin:    General: Skin is warm and dry.     Findings: No rash.  Neurological:     Mental Status: She is alert and oriented to person, place, and time. Mental status is at baseline.  Psychiatric:        Mood and Affect: Mood normal.        Behavior: Behavior normal.      No results found for any visits on 07/11/23.  The ASCVD Risk score (Arnett DK, et al., 2019) failed to calculate for the following reasons:   The 2019 ASCVD risk score is only valid for ages 32 to 41    Assessment & Plan:   Problem List Items Addressed This Visit   None Visit Diagnoses     Abdominal distension    -  Primary   Bloating       Constipation, unspecified constipation type             Post-infectious Irritable Bowel Syndrome: History of acute gastroenteritis one month ago with persistent bloating, alternating constipation and diarrhea, and discomfort. No red flag symptoms such as localized pain, blood in stool, or fever. -Start a probiotic twice daily as per instructions on the product. -Start Miralax once daily in the morning, adjusting as needed based on bowel movements. -Adopt a bland diet temporarily to  reduce bloating and discomfort. -Report any red flag symptoms such as localized pain, blood in stool, or fever. -Regular follow-up scheduled for October 07, 2023.        No follow-ups on file.    Shirlee Latch, MD

## 2023-07-16 DIAGNOSIS — H01131 Eczematous dermatitis of right upper eyelid: Secondary | ICD-10-CM | POA: Diagnosis not present

## 2023-07-16 DIAGNOSIS — H01134 Eczematous dermatitis of left upper eyelid: Secondary | ICD-10-CM | POA: Diagnosis not present

## 2023-07-16 DIAGNOSIS — S5011XA Contusion of right forearm, initial encounter: Secondary | ICD-10-CM | POA: Diagnosis not present

## 2023-07-16 DIAGNOSIS — D692 Other nonthrombocytopenic purpura: Secondary | ICD-10-CM | POA: Diagnosis not present

## 2023-08-07 ENCOUNTER — Other Ambulatory Visit: Payer: Self-pay | Admitting: Family Medicine

## 2023-08-20 ENCOUNTER — Telehealth: Payer: Self-pay | Admitting: Family Medicine

## 2023-08-20 NOTE — Telephone Encounter (Signed)
Sending a form back for completion for patient to hike trails with [employer]. Please call patient when ready for pickup.

## 2023-08-22 NOTE — Telephone Encounter (Signed)
Left vm letting pt know that form is ready at the front desk.  Made copy for chart and placed form up front on 200 side for pt to p/u

## 2023-08-22 NOTE — Telephone Encounter (Signed)
Completed and ready for pick up. Given to [place] to call patient.

## 2023-09-11 ENCOUNTER — Other Ambulatory Visit: Payer: Self-pay | Admitting: Family Medicine

## 2023-09-12 NOTE — Telephone Encounter (Signed)
Requested medication (s) are due for refill today: yes  Requested medication (s) are on the active medication list: yes  Last refill:  01/21/23  Future visit scheduled: yes  Notes to clinic:  Unable to refill per protocol, cannot delegate.      Requested Prescriptions  Pending Prescriptions Disp Refills   zolpidem (AMBIEN) 5 MG tablet [Pharmacy Med Name: ZOLPIDEM TARTRATE 5 MG TABLET] 15 tablet     Sig: TAKE 1/2 TABLET (2.5 MG TOTAL) BY MOUTH AT BEDTIME AS NEEDED FOR SLEEP     Not Delegated - Psychiatry:  Anxiolytics/Hypnotics Failed - 09/11/2023  5:22 PM      Failed - This refill cannot be delegated      Failed - Urine Drug Screen completed in last 360 days      Passed - Valid encounter within last 6 months    Recent Outpatient Visits           2 months ago Abdominal distension   [facility] [place], Marzella Schlein, MD   5 months ago Encounter for annual wellness visit (AWV) in Medicare patient   [facility] [place], Marzella Schlein, MD   7 months ago Vulvar lesion   [facility] [place], Marzella Schlein, MD   7 months ago Essential hypertension   [place] [facility] [place], [place], PA-C   7 months ago Essential hypertension   [facility] [place], Marzella Schlein, MD       Future Appointments             In 3 weeks Bacigalupo, Marzella Schlein, MD [facility]

## 2023-09-16 ENCOUNTER — Telehealth: Payer: Self-pay

## 2023-09-16 NOTE — Telephone Encounter (Signed)
Copied from CRM [id number]. Topic: General - Inquiry >> Sep 16, 2023  9:16 AM Macon Large wrote: Reason for CRM: Pt stated that she was informed that she is in the doughnut hole and she would be responsible for paying $91 for the Eliquis. Pt would like to know if she could get samples. Cb# [phone]

## 2023-09-17 ENCOUNTER — Encounter: Payer: Self-pay | Admitting: Family Medicine

## 2023-09-17 ENCOUNTER — Telehealth: Payer: Self-pay | Admitting: Cardiology

## 2023-09-17 ENCOUNTER — Ambulatory Visit (INDEPENDENT_AMBULATORY_CARE_PROVIDER_SITE_OTHER): Payer: HMO | Admitting: Family Medicine

## 2023-09-17 VITALS — BP 138/80 | HR 63 | Temp 97.7°F | Resp 16 | Ht 63.0 in | Wt 120.0 lb

## 2023-09-17 DIAGNOSIS — M722 Plantar fascial fibromatosis: Secondary | ICD-10-CM | POA: Diagnosis not present

## 2023-09-17 MED ORDER — APIXABAN 2.5 MG PO TABS: 2.5000 mg | ORAL_TABLET | Freq: Two times a day (BID) | ORAL | Status: DC

## 2023-09-17 NOTE — Telephone Encounter (Signed)
Pt made aware samples are available for pick up. Nurse will also provide pt with Eliquis patient assistance application

## 2023-09-17 NOTE — Telephone Encounter (Signed)
Ok to give 1 month of samples if we have them. She is coming in for an appt today and could get them then.

## 2023-09-17 NOTE — Telephone Encounter (Signed)
Patient calling the office for samples of medication:   1.  What medication and dosage are you requesting samples for? apixaban (ELIQUIS) 2.5 MG TABS tablet   2.  Are you currently out of this medication? Yes, patient mentioned they are now in the donut hole with their insurance and will be going out of town soon.

## 2023-09-17 NOTE — Telephone Encounter (Signed)
Samples (3 boxes) of eliquis given to patient.

## 2023-09-17 NOTE — Progress Notes (Signed)
Acute Office Visit  Subjective:     Patient ID: Alexis Lyons, female    DOB: 03-20-1941, 82 y.o.   MRN: [id number]  Chief Complaint  Patient presents with   pain, right foot    This started a week ago, Sunday. No injury.     HPI Discussed the use of AI scribe software for clinical note transcription with the patient, who gave verbal consent to proceed.  History of Present Illness   The patient, with a history of hypertension, presents with foot pain that started a week and a half ago after a walk at [facility]. The pain is located on the bottom part of the right foot, specifically on the right side. They have been managing the pain with icing and Tylenol, but the pain has not improved. They have also experienced numbness in the foot after showering, which they believe is related to their blood pressure medication. They have tried massaging the foot with a spoon handle, which provided some relief. The pain has since spread to the heel, causing discomfort. The patient is concerned about the foot pain as they are planning a trip to [place] where they will be doing a lot of walking.       ROS per HPI      Objective:    BP 138/80 (BP Location: Left Arm, Patient Position: Sitting, Cuff Size: Normal)   Pulse 63   Temp 97.7 F (36.5 C) (Oral)   Resp 16   Ht 5\' 3"  (1.6 m)   Wt 120 lb (54.4 kg)   BMI 21.26 kg/m    Physical Exam Vitals reviewed.  Constitutional:      General: She is not in acute distress.    Appearance: She is well-developed.  HENT:     Head: Normocephalic and atraumatic.  Eyes:     General: No scleral icterus.    Conjunctiva/sclera: Conjunctivae normal.  Cardiovascular:     Rate and Rhythm: Normal rate and regular rhythm.  Pulmonary:     Effort: Pulmonary effort is normal. No respiratory distress.  Musculoskeletal:     Comments: TTP on plantar surface on R foot  Skin:    General: Skin is warm and dry.     Findings: No rash.  Neurological:      Mental Status: She is alert and oriented to person, place, and time.  Psychiatric:        Behavior: Behavior normal.      No results found for any visits on 09/17/23.      Assessment & Plan:   Problem List Items Addressed This Visit   None Visit Diagnoses     Plantar fasciitis    -  Primary           Plantar Fasciitis New onset of foot pain for 1.5 weeks, exacerbated by walking. Pain localized to the right side of the foot, extending from the big toe to the heel. No prior history of similar symptoms. -Continue self-care measures including icing and stretching. -Use a frozen water bottle with a small amount of rubbing alcohol for cold massage. -Continue Tylenol twice daily for pain management. -Consider purchasing supportive shoes, specifically Brooks or [employer] brands, for walking during upcoming trip. -Plan to rest and avoid overexertion during trip to prevent exacerbation of symptoms.  General Health Maintenance / Followup Plans -Printed information on plantar fasciitis for patient's reference. -Plan to follow up after trip to assess resolution of plantar fasciitis symptoms.  No orders of the defined types were placed in this encounter.   Return if symptoms worsen or fail to improve.  Shirlee Latch, MD

## 2023-10-07 ENCOUNTER — Ambulatory Visit: Payer: HMO | Admitting: Family Medicine

## 2023-10-07 NOTE — Progress Notes (Deleted)
   Established Patient Office Visit  Subjective   Patient ID: Alexis Lyons, female    DOB: November 22, 1941  Age: 82 y.o. MRN: [id number]  No chief complaint on file.   HPI  Discussed the use of AI scribe software for clinical note transcription with the patient, who gave verbal consent to proceed.  History of Present Illness           {History (Optional):23778}  ROS    Objective:     There were no vitals taken for this visit. {Vitals History (Optional):23777}  Physical Exam Vitals reviewed.  Constitutional:      General: She is not in acute distress.    Appearance: Normal appearance. She is well-developed. She is not diaphoretic.  HENT:     Head: Normocephalic and atraumatic.  Eyes:     General: No scleral icterus.    Conjunctiva/sclera: Conjunctivae normal.  Neck:     Thyroid: No thyromegaly.  Cardiovascular:     Rate and Rhythm: Normal rate and regular rhythm.     Heart sounds: Normal heart sounds. No murmur heard. Pulmonary:     Effort: Pulmonary effort is normal. No respiratory distress.     Breath sounds: Normal breath sounds. No wheezing, rhonchi or rales.  Musculoskeletal:     Cervical back: Neck supple.     Right lower leg: No edema.     Left lower leg: No edema.  Lymphadenopathy:     Cervical: No cervical adenopathy.  Skin:    General: Skin is warm and dry.     Findings: No rash.  Neurological:     Mental Status: She is alert and oriented to person, place, and time. Mental status is at baseline.  Psychiatric:        Mood and Affect: Mood normal.        Behavior: Behavior normal.      No results found for any visits on 10/07/23.  {Labs (Optional):23779}  The ASCVD Risk score (Arnett DK, et al., 2019) failed to calculate for the following reasons:   The 2019 ASCVD risk score is only valid for ages 59 to 17    Assessment & Plan:   Problem List Items Addressed This Visit   None                No follow-ups on file.    Shirlee Latch, MD

## 2023-10-15 ENCOUNTER — Ambulatory Visit (INDEPENDENT_AMBULATORY_CARE_PROVIDER_SITE_OTHER): Payer: HMO | Admitting: Physician Assistant

## 2023-10-15 ENCOUNTER — Encounter: Payer: Self-pay | Admitting: Physician Assistant

## 2023-10-15 VITALS — BP 151/87 | HR 86 | Temp 97.6°F | Ht 63.0 in | Wt 122.0 lb

## 2023-10-15 DIAGNOSIS — J3089 Other allergic rhinitis: Secondary | ICD-10-CM | POA: Diagnosis not present

## 2023-10-15 DIAGNOSIS — G4489 Other headache syndrome: Secondary | ICD-10-CM | POA: Diagnosis not present

## 2023-10-15 NOTE — Progress Notes (Signed)
Established patient visit  Patient: Alexis Lyons   DOB: [birth year]   82 y.o. Female  MRN: [id number] Visit Date: 10/15/2023  Today's healthcare provider: Debera Lat, PA-C   Chief Complaint  Patient presents with   headache    Headache, pressure, fatigue and denied. ---after coming from [place].Taken sudafed, Flonase, and Claritin.   Subjective     Discussed the use of AI scribe software for clinical note transcription with the patient, who gave verbal consent to proceed.  History of Present Illness   The patient, with a history of sinusitis and allergic rhinitis, presents with fatigue and headache. They describe the headache as being located in the sinus area, but deny any runny nose or watery eyes. The patient admits to self-medicating with Sudafed, despite knowing it may interact with their other medications. They also tried Flonase and Claritin, but did not continue with these treatments as they were unsure if they were helping. The patient denies any coughing, but feels congested and suspects a sinus infection. They report sleeping well at night and have no other symptoms. The patient also mentions a history of heart problems          09/17/2023    2:17 PM 07/11/2023    2:18 PM 04/04/2023   11:10 AM  Depression screen PHQ 2/9  Decreased Interest 0 0 0  Down, Depressed, Hopeless 0 0 0  PHQ - 2 Score 0 0 0  Altered sleeping  0 0  Tired, decreased energy  0 0  Change in appetite  0 0  Feeling bad or failure about yourself   0 0  Trouble concentrating  0 0  Moving slowly or fidgety/restless  0 0  Suicidal thoughts  0 0  PHQ-9 Score  0 0  Difficult doing work/chores  Not difficult at all Not difficult at all      09/17/2023    2:18 PM  GAD 7 : Generalized Anxiety Score  Nervous, Anxious, on Edge 0  Control/stop worrying 0  Worry too much - different things 0  Trouble relaxing 0  Restless 0  Easily annoyed or irritable 0  Afraid - awful might happen 0  Total GAD  7 Score 0  Anxiety Difficulty Not difficult at all    Medications: Outpatient Medications Prior to Visit  Medication Sig   apixaban (ELIQUIS) 2.5 MG TABS tablet Take 1 tablet (2.5 mg total) by mouth 2 (two) times daily.   conjugated estrogens (PREMARIN) vaginal cream Place 1 Applicatorful vaginally 2 (two) times a week.   Cyanocobalamin (B-12 PO) Take by mouth daily.   hydrochlorothiazide (HYDRODIURIL) 12.5 MG tablet TAKE 1 TABLET BY MOUTH EVERY DAY   metoprolol succinate (TOPROL XL) 25 MG 24 hr tablet Take 0.5 tablets (12.5 mg total) by mouth daily.   zolpidem (AMBIEN) 5 MG tablet TAKE 1/2 TABLET (2.5 MG TOTAL) BY MOUTH AT BEDTIME AS NEEDED FOR SLEEP   No facility-administered medications prior to visit.    Review of Systems  All other systems reviewed and are negative.  Except see HPI       Objective    BP (!) 151/87 (BP Location: Right Arm, Patient Position: Sitting, Cuff Size: Normal)   Pulse 86   Temp 97.6 F (36.4 C)   Ht 5\' 3"  (1.6 m)   Wt 122 lb (55.3 kg)   SpO2 97%   BMI 21.61 kg/m     Physical Exam Vitals reviewed.  Constitutional:      General: She is  not in acute distress.    Appearance: Normal appearance. She is well-developed. She is not diaphoretic.  HENT:     Head: Normocephalic and atraumatic.     Right Ear: Ear canal and external ear normal.     Left Ear: Ear canal and external ear normal.     Ears:     Comments: Fluids behind the tms     Nose: Congestion and rhinorrhea present.     Mouth/Throat:     Pharynx: No posterior oropharyngeal erythema.     Comments: Postnasal drainage Eyes:     General: No scleral icterus.       Right eye: No discharge.        Left eye: No discharge.     Extraocular Movements: Extraocular movements intact.     Conjunctiva/sclera: Conjunctivae normal.     Pupils: Pupils are equal, round, and reactive to light.  Neck:     Thyroid: No thyromegaly.  Cardiovascular:     Rate and Rhythm: Normal rate and regular  rhythm.     Pulses: Normal pulses.     Heart sounds: Normal heart sounds. No murmur heard. Pulmonary:     Effort: Pulmonary effort is normal. No respiratory distress.     Breath sounds: Normal breath sounds. No wheezing, rhonchi or rales.  Musculoskeletal:     Cervical back: Neck supple.     Right lower leg: No edema.     Left lower leg: No edema.  Lymphadenopathy:     Cervical: No cervical adenopathy.  Skin:    General: Skin is warm and dry.     Findings: No rash.  Neurological:     Mental Status: She is alert and oriented to person, place, and time. Mental status is at baseline.  Psychiatric:        Mood and Affect: Mood normal.        Behavior: Behavior normal.      No results found for any visits on 10/15/23.  Assessment & Plan    1. Non-seasonal allergic rhinitis due to other allergic trigger  2. Other headache syndrom     Sinusitis Worsening symptoms over the past week with fatigue and headache. No cough or fever. Nasal congestion noted on examination. Possible allergic component given history of allergic rhinitis. -Start Flonase two puffs once daily for one week, then one puff daily until symptoms resolve. -Start Zyrtec once daily until symptoms resolve. -Use nasal saline rinse before Flonase application. -If no improvement in one week, patient to contact office for possible antibiotic prescription.  Travel considerations Patient planning to fly. -Take Zyrtec one hour before flight. -Continue Flonase and nasal saline rinse as directed.     Similar episodes in 12/2022. No follow-ups on file.     The patient was advised to call back or seek an in-person evaluation if the symptoms worsen or if the condition fails to improve as anticipated.  I discussed the assessment and treatment plan with the patient. The patient was provided an opportunity to ask questions and all were answered. The patient agreed with the plan and demonstrated an understanding of the  instructions.  I, Debera Lat, PA-C have reviewed all documentation for this visit. The documentation on  10/15/23 for the exam, diagnosis, procedures, and orders are all accurate and complete.  Debera Lat, [facility], MMS [facility] [phone] (phone) [phone] (fax)  [facility]

## 2023-10-18 DIAGNOSIS — G459 Transient cerebral ischemic attack, unspecified: Secondary | ICD-10-CM

## 2023-10-18 HISTORY — DX: Transient cerebral ischemic attack, unspecified: G45.9

## 2023-10-22 ENCOUNTER — Ambulatory Visit: Payer: Self-pay | Admitting: *Deleted

## 2023-10-22 ENCOUNTER — Encounter: Payer: Self-pay | Admitting: Physician Assistant

## 2023-10-22 ENCOUNTER — Ambulatory Visit (INDEPENDENT_AMBULATORY_CARE_PROVIDER_SITE_OTHER): Payer: HMO | Admitting: Physician Assistant

## 2023-10-22 VITALS — BP 135/88 | HR 56 | Ht 63.0 in | Wt 127.0 lb

## 2023-10-22 DIAGNOSIS — J018 Other acute sinusitis: Secondary | ICD-10-CM

## 2023-10-22 NOTE — Telephone Encounter (Signed)
Summary: headaches/soreness in right eye/tired-ness   Patient called and states she saw Debera Lat last week for a sinus problem and she recommended her to take Flonase & Zyrtec. Patient states she has been taking both but she stopped taking Zyrtec because it makes her really tired. Patient has been experiencing headaches for over a week now along with a soreness in her right eye. She states it is only soreness in her right eye, it is not severe and it is not constant. She states she takes tylenol and it helps some but she does not want to keep taking the tylenol. Patient questions if she needs something else, something stronger?  Patient states she is still experiencing the tired-ness. No other symptoms.  Please contact patient back @ # [phone]          Chief Complaint: Headache Symptoms: 8/10 headache, comes and goes, 10/10 at times, right sided back of head to right eye. Right eye sore, "Not cheekbone, eyeball." Increased fatigue Frequency: Seen 10/15/23 for similar symptoms. Stopped Zyrtec due to fatigue, "Still fatigued." Pertinent Negatives: Patient denies fever Disposition: [] ED /[] Urgent Care (no appt availability in office) / [x] Appointment(In office/virtual)/ []  [facility]/ [] Home Care/ [] Refused Recommended Disposition /[]  Mobile Bus/ []  Follow-up with PCP Additional Notes: Appt secured for today. States was told to come back if symptoms remain, OV 10/15/23.  Reason for Disposition  [1] MODERATE headache (e.g., interferes with normal activities) AND [2] present > 24 hours AND [3] unexplained  (Exceptions: analgesics not tried, typical migraine, or headache part of viral illness)  Answer Assessment - Initial Assessment Questions 1. LOCATION: "Where does it hurt?"      Right back of head 2. ONSET: "When did the headache start?" (Minutes, hours or days)      Last week 3. PATTERN: "Does the pain come and go, or has it been constant since it  started?"     Comes and goes 4. SEVERITY: "How bad is the pain?" and "What does it keep you from doing?"  (e.g., Scale 1-10; mild, moderate, or severe)   - MILD (1-3): doesn't interfere with normal activities    - MODERATE (4-7): interferes with normal activities or awakens from sleep    - SEVERE (8-10): excruciating pain, unable to do any normal activities        8/10 5. RECURRENT SYMPTOM: "Have you ever had headaches before?" If Yes, ask: "When was the last time?" and "What happened that time?"      NA 6. CAUSE: "What do you think is causing the headache?"     Unsure 7. MIGRAINE: "Have you been diagnosed with migraine headaches?" If Yes, ask: "Is this headache similar?"      No 8. HEAD INJURY: "Has there been any recent injury to the head?"      No 9. OTHER SYMPTOMS: "Do you have any other symptoms?" (fever, stiff neck, eye pain, sore throat, cold symptoms)     Eye pain, stopped taking Zyrtec, fatigued. Still fatigued.  Protocols used: [facility]

## 2023-10-22 NOTE — Progress Notes (Unsigned)
Established patient visit  Patient: Alexis Lyons   DOB: 02-19-1941   82 y.o. Female  MRN: [id number] Visit Date: 10/22/2023  Today's healthcare provider: Debera Lat, PA-C   Chief Complaint  Patient presents with   Headache Location Descriptor: right sided back of head to right eye.  Characterized as pressure.  Pain was noted as 8/10.  This started 1 week.  Occurs daily.  Since onset it is gradually worsening.  Treatments Attempted: tylenol.  Response to treatment was mild improvement.  Associated symptoms include eye pain and fatigue.   Subjective     Discussed the use of AI scribe software for clinical note transcription with the patient, who gave verbal consent to proceed.  History of Present Illness   The patient, with a history of sinusitis, presents with a persistent headache, eye pain, and fatigue. The headache and eye pain have been present for approximately three days and are described as a pressure sensation, primarily on the right side. The patient also reports feeling nauseous for the first time that morning. The patient denies any problems with breathing, runny nose, or watery eyes.  Previously, the patient was prescribed Flonase and Zyrtec for sinusitis. However, they stopped taking these medications due to side effects of drowsiness and fatigue. The patient also used a nasal saline rinse for about a week but stopped because they felt it was making them tired. The patient reports that their symptoms worsened after they stopped taking the medications.  The patient recently traveled to [place] for a funeral and experienced a blocked left ear for two days after the flight, which has since resolved. The patient denies any sore throat, abdominal problems, or jaw pain. The patient also mentions a history of caffeine-related headaches, which are different from the current headache.           09/17/2023    2:17 PM 07/11/2023    2:18 PM 04/04/2023   11:10 AM  Depression  screen PHQ 2/9  Decreased Interest 0 0 0  Down, Depressed, Hopeless 0 0 0  PHQ - 2 Score 0 0 0  Altered sleeping  0 0  Tired, decreased energy  0 0  Change in appetite  0 0  Feeling bad or failure about yourself   0 0  Trouble concentrating  0 0  Moving slowly or fidgety/restless  0 0  Suicidal thoughts  0 0  PHQ-9 Score  0 0  Difficult doing work/chores  Not difficult at all Not difficult at all      09/17/2023    2:18 PM  GAD 7 : Generalized Anxiety Score  Nervous, Anxious, on Edge 0  Control/stop worrying 0  Worry too much - different things 0  Trouble relaxing 0  Restless 0  Easily annoyed or irritable 0  Afraid - awful might happen 0  Total GAD 7 Score 0  Anxiety Difficulty Not difficult at all    Medications: Outpatient Medications Prior to Visit  Medication Sig   apixaban (ELIQUIS) 2.5 MG TABS tablet Take 1 tablet (2.5 mg total) by mouth 2 (two) times daily.   conjugated estrogens (PREMARIN) vaginal cream Place 1 Applicatorful vaginally 2 (two) times a week.   Cyanocobalamin (B-12 PO) Take by mouth daily.   hydrochlorothiazide (HYDRODIURIL) 12.5 MG tablet TAKE 1 TABLET BY MOUTH EVERY DAY   metoprolol succinate (TOPROL XL) 25 MG 24 hr tablet Take 0.5 tablets (12.5 mg total) by mouth daily.   zolpidem (AMBIEN) 5 MG tablet TAKE 1/2  TABLET (2.5 MG TOTAL) BY MOUTH AT BEDTIME AS NEEDED FOR SLEEP   No facility-administered medications prior to visit.    Review of Systems  All other systems reviewed and are negative.  Except see HPI       Objective    BP 135/88 (BP Location: Left Arm, Patient Position: Sitting, Cuff Size: Normal)   Pulse (!) 56   Ht 5\' 3"  (1.6 m)   Wt 127 lb (57.6 kg)   SpO2 97%   BMI 22.50 kg/m     Physical Exam Vitals reviewed.  Constitutional:      General: She is not in acute distress.    Appearance: Normal appearance. She is well-developed. She is not diaphoretic.  HENT:     Head: Normocephalic and atraumatic.     Right Ear: Ear  canal and external ear normal. There is impacted cerumen (partially).     Left Ear: Ear canal and external ear normal.     Nose: Congestion (mild) and rhinorrhea (mild) present.  Eyes:     General: No scleral icterus.    Extraocular Movements: Extraocular movements intact.     Conjunctiva/sclera: Conjunctivae normal.     Pupils: Pupils are equal, round, and reactive to light.  Neck:     Thyroid: No thyromegaly.  Cardiovascular:     Rate and Rhythm: Normal rate and regular rhythm.     Pulses: Normal pulses.     Heart sounds: Normal heart sounds. No murmur heard. Pulmonary:     Effort: Pulmonary effort is normal. No respiratory distress.     Breath sounds: Normal breath sounds. No wheezing, rhonchi or rales.  Musculoskeletal:     Cervical back: Normal range of motion and neck supple.     Right lower leg: No edema.     Left lower leg: No edema.  Lymphadenopathy:     Cervical: No cervical adenopathy.  Skin:    General: Skin is warm and dry.     Findings: No rash.  Neurological:     Mental Status: She is alert and oriented to person, place, and time. Mental status is at baseline.  Psychiatric:        Mood and Affect: Mood normal.        Behavior: Behavior normal.      No results found for any visits on 10/22/23.  Assessment & Plan        Sinusitis Could be due to unresolved Persistent headache, eye pain, and fatigue despite a week of Flonase and Zyrtec. Headache and eye pain started after stopping using flonase and zyrtec. No respiratory symptoms. Nasal congestion noted on examination. Recent travel to [place]. -Continue nasal saline spray. -Reduce Flonase to one puff daily. -Take Zyrtec at night due to drowsiness. -Consider starting prednisone if symptoms persist. Continue to drink plenty of water. Denies vision loss, proptosis, ocular motility.  General Health Maintenance -Encouraged to increase water intake. -Advised to maintain a healthy diet, including  salads. -Consider blood work to evaluate for potential causes of fatigue, such as low hemoglobin or thyroid issues. However, pt declined at this moment     No follow-ups on file.    The patient was advised to call back or seek an in-person evaluation if the symptoms worsen or if the condition fails to improve as anticipated.  I discussed the assessment and treatment plan with the patient. The patient was provided an opportunity to ask questions and all were answered. The patient agreed with the plan and demonstrated  an understanding of the instructions.  I, Debera Lat, PA-C have reviewed all documentation for this visit. The documentation on 10/22/23 for the exam, diagnosis, procedures, and orders are all accurate and complete.  Debera Lat, [facility], MMS [facility] [phone] (phone) [phone] (fax)   [facility]

## 2023-10-25 ENCOUNTER — Ambulatory Visit: Payer: HMO | Attending: Cardiology | Admitting: Cardiology

## 2023-10-25 VITALS — BP 132/82 | HR 59 | Ht 63.0 in | Wt 122.0 lb

## 2023-10-25 DIAGNOSIS — I480 Paroxysmal atrial fibrillation: Secondary | ICD-10-CM | POA: Diagnosis not present

## 2023-10-25 DIAGNOSIS — I471 Supraventricular tachycardia, unspecified: Secondary | ICD-10-CM

## 2023-10-25 DIAGNOSIS — I10 Essential (primary) hypertension: Secondary | ICD-10-CM

## 2023-10-25 NOTE — Patient Instructions (Signed)
Medication Instructions:  The current medical regimen is effective;  continue present plan and medications.  *If you need a refill on your cardiac medications before your next appointment, please call your pharmacy*   Follow-Up: At [facility], you and your health needs are our priority.  As part of our continuing mission to provide you with exceptional heart care, we have created designated Provider Care Teams.  These Care Teams include your primary Cardiologist (physician) and Advanced Practice Providers (APPs -  Physician Assistants and Nurse Practitioners) who all work together to provide you with the care you need, when you need it.  We recommend signing up for the patient portal called "MyChart".  Sign up information is provided on this After Visit Summary.  MyChart is used to connect with patients for Virtual Visits (Telemedicine).  Patients are able to view lab/test results, encounter notes, upcoming appointments, etc.  Non-urgent messages can be sent to your provider as well.   To learn more about what you can do with MyChart, go to [email or web address].    Your next appointment:   12 month(s)  Provider:   Steffanie Dunn, MD or Sherie Don, NP

## 2023-10-25 NOTE — Progress Notes (Signed)
Cardiology Office Note Date:  10/25/2023  Patient ID:  Alexis Lyons, Alexis Lyons 01/13/1941, MRN [id number] PCP:  Erasmo Downer, MD  Cardiologist:  Debbe Odea, MD Electrophysiologist: Lanier Prude, MD   Chief Complaint: 1 year afib follow-up  History of Present Illness: Alexis Lyons is a 82 y.o. female with PMH notable for parox AFib, atrial tach, HTN, syncope; seen today for Lanier Prude, MD for routine electrophysiology followup.  She is s/p AF ablation w PVI 10/2021 She last saw Dr. Lalla Brothers 10/2022. Less LH and syncope since stopping hydrochlorothiazide and metoprolol. In follow-up, Dr. Azucena Cecil restarted low dose hydrochlorothiazide and toprol  On follow-up today, she is feeling very well. She has had no further episodes of lightheadedness or dizziness. She denies chest pain, chest palpitations or fluttering in chest. She does not think she has had any AFib episodes since last being seen. She continues to take eliquis BID, no bleeding concerns.     Past Medical History:  Diagnosis Date   Amnesia    Aphthae    Atrial fibrillation (HCC)    Congestion-fibrosis syndrome    COVID 12/12/2022   DD (diverticular disease)    GERD (gastroesophageal reflux disease)    Seizures (HCC) 12/2017   passed out due to dehydration    Past Surgical History:  Procedure Laterality Date   ATRIAL FIBRILLATION ABLATION N/A 11/02/2021   Procedure: ATRIAL FIBRILLATION ABLATION;  Surgeon: Lanier Prude, MD;  Location: [facility];  Service: Cardiovascular;  Laterality: N/A;   CHOLECYSTECTOMY     colonoscopy with polypectomy     8/11 4 mm polyp, 12 mm polyp   COLONOSCOPY WITH PROPOFOL N/A 02/24/2018   Procedure: COLONOSCOPY WITH PROPOFOL;  Surgeon: Scot Jun, MD;  Location: [facility] ENDOSCOPY;  Service: Endoscopy;  Laterality: N/A;   HERNIA REPAIR Right 2004   inguinal    HERNIA REPAIR     x's 3    Current Outpatient Medications  Medication  Instructions   apixaban (ELIQUIS) 2.5 mg, Oral, 2 times daily   conjugated estrogens (PREMARIN) vaginal cream 1 Applicatorful, Vaginal, 2 times weekly   Cyanocobalamin (B-12 PO) Oral, Daily   hydrochlorothiazide (HYDRODIURIL) 12.5 mg, Oral, Daily   metoprolol succinate (TOPROL XL) 12.5 mg, Oral, Daily   zolpidem (AMBIEN) 5 MG tablet TAKE 1/2 TABLET (2.5 MG TOTAL) BY MOUTH AT BEDTIME AS NEEDED FOR SLEEP    Social History:  The patient  reports that she quit smoking about 37 years ago. Her smoking use included cigarettes. She started smoking about 52 years ago. She has a 7.5 pack-year smoking history. She has never used smokeless tobacco. She reports current alcohol use of about 7.0 standard drinks of alcohol per week. She reports that she does not use drugs.   Family History:  The patient's family history includes Alcohol abuse in her father; Alzheimer's disease in her mother; Breast cancer in her sister; Cancer in her father and sister.  ROS:  Please see the history of present illness. All other systems are reviewed and otherwise negative.   PHYSICAL EXAM:  VS:  BP 132/82 (BP Location: Left Arm, Patient Position: Sitting, Cuff Size: Normal)   Pulse (!) 59   Ht 5\' 3"  (1.6 m)   Wt 122 lb (55.3 kg)   SpO2 98%   BMI 21.61 kg/m  BMI: Body mass index is 21.61 kg/m.  GEN- The patient is well appearing, alert and oriented x 3 today.   Lungs- Clear to ausculation bilaterally,  normal work of breathing.  Heart- Regular rate and rhythm, no murmurs, rubs or gallops Extremities- No peripheral edema, warm, dry   EKG is ordered. Personal review of EKG from today shows:    EKG Interpretation Date/Time:  Friday October 25 2023 14:54:35 EST Ventricular Rate:  59 PR Interval:  178 QRS Duration:  76 QT Interval:  434 QTC Calculation: 429 R Axis:   -12  Text Interpretation: Sinus bradycardia with sinus arrhythmia Minimal voltage criteria for LVH, may be normal variant ( R in aVL ) Confirmed by  Sherie Don [phone]) on 10/25/2023 3:34:14 PM    Recent Labs: 01/16/2023: ALT 20; Hemoglobin 14.7; Platelets 252 02/05/2023: BUN 16; Creatinine, Ser 0.84; Potassium 3.6; Sodium 140  No results found for requested labs within last 365 days.   CrCl cannot be calculated (Patient's most recent lab result is older than the maximum 21 days allowed.).   Wt Readings from Last 3 Encounters:  10/25/23 122 lb (55.3 kg)  10/22/23 127 lb (57.6 kg)  10/15/23 122 lb (55.3 kg)     Additional studies reviewed include: Previous EP, cardiology notes.   CT coronary, 03/03/2023 1. Coronary calcium score of 9.38. This was 18th percentile for age and sex matched control.  2. Normal coronary origin with right dominance.  3. Minimal proximal LAD calcification (<25%).  4. CAD-RADS 1. Minimal non-obstructive CAD (0-24%). Consider non-atherosclerotic causes of chest pain. Consider preventive therapy and risk factor modification.  Long term monitor, 09/07/2022 Patient had a min HR of 44 bpm, max HR of 171 bpm, and avg HR of 69 bpm. Predominant underlying rhythm was Sinus Rhythm. 9604 Supraventricular Tachycardia runs occurred, the run with the fastest interval lasting 5 beats with a max rate of 171 bpm, the  longest lasting 6 mins 18 secs with an avg rate of 133 bpm. Some episodes of Supraventricular Tachycardia may be possible Atrial Tachycardia with variable block. Isolated SVEs were occasional (2.1%, [id number]), SVE Couplets were occasional (1.1%, 6807), and  SVE Triplets were occasional (1.0%, 4324). Isolated VEs were rare (<1.0%), VE Couplets were rare (<1.0%), and no VE Triplets were present.   Conclusion Paroxysmal SVT, episodes of atrial tachycardia noted No atrial fibrillation or atrial flutter.  TTE bubble, 08/16/2022  1. Left ventricular ejection fraction, by estimation, is 55 to 60%. The left ventricle has normal function. The left ventricle has no regional wall motion abnormalities. Left ventricular  diastolic parameters are consistent with Grade I diastolic dysfunction (impaired relaxation).   2. Right ventricular systolic function is normal. The right ventricular size is normal. There is normal pulmonary artery systolic pressure. The estimated right ventricular systolic pressure is 27.2 mmHg.   3. The mitral valve is normal in structure. No evidence of mitral valve regurgitation. No evidence of mitral stenosis.   4. Tricuspid valve regurgitation is moderate.   5. The aortic valve is tricuspid. Aortic valve regurgitation is not visualized. No aortic stenosis is present.   6. There is borderline dilatation of the ascending aorta, measuring 36 mm.   7. The inferior vena cava is normal in size with greater than 50% respiratory variability, suggesting right atrial pressure of 3 mmHg.       ASSESSMENT AND PLAN:  #) parox AFib #) SVT No recent palpitation episodes Continue 12.5mg  toprol daily  #) Hypercoag d/t parox afib CHA2DS2-VASc Score = 3 [CHF History: 0, HTN History: 0, Diabetes History: 0, Stroke History: 0, Vascular Disease History: 0, Age Score: 2, Gender Score: 1].  Therefore, the  patient's annual risk of stroke is 3.2 %.    Stroke ppx - 2.5mg  eliquis, dose appropriately reduced for age, weight No bleeding concerns  #) HTN At goal today.  Recommend checking blood pressures 1-2 times per week at home and recording the values.  Recommend bringing these recordings to the primary care physician.        Current medicines are reviewed at length with the patient today.   The patient does not have concerns regarding her medicines.  The following changes were made today:  none  Labs/ tests ordered today include:  Orders Placed This Encounter  Procedures   EKG 12-Lead     Disposition: Follow up with Dr. Lalla Brothers or EP APP in 12 months   Signed, Sherie Don, NP  10/25/23  4:24 PM  Electrophysiology [facility]

## 2023-11-01 ENCOUNTER — Encounter: Payer: Self-pay | Admitting: Family Medicine

## 2023-11-01 ENCOUNTER — Ambulatory Visit (INDEPENDENT_AMBULATORY_CARE_PROVIDER_SITE_OTHER): Payer: HMO | Admitting: Family Medicine

## 2023-11-01 ENCOUNTER — Ambulatory Visit: Payer: Self-pay

## 2023-11-01 VITALS — BP 121/73 | HR 65 | Ht 63.0 in | Wt 121.0 lb

## 2023-11-01 DIAGNOSIS — J0111 Acute recurrent frontal sinusitis: Secondary | ICD-10-CM | POA: Diagnosis not present

## 2023-11-01 MED ORDER — AZITHROMYCIN 250 MG PO TABS: ORAL_TABLET | ORAL | 0 refills | Status: AC

## 2023-11-01 NOTE — Progress Notes (Signed)
      Established patient visit   Patient: Alexis Lyons   DOB: 1941/11/22   82 y.o. Female  MRN: [id number] Visit Date: 11/01/2023  Today's healthcare provider: Mila Merry, MD   Chief Complaint  Patient presents with   Follow-up   Subjective    Discussed the use of AI scribe software for clinical note transcription with the patient, who gave verbal consent to proceed.  History of Present Illness   The patient, with a recent history of international travel, presents with persistent unilateral (right-sided) head and eye discomfort. She describes the sensation as non-excruciating, intermittent, and at times, numbness. The discomfort is localized to the top and back of the head, and the right eye. She denies any associated fever, chills, or sweats.  The patient also reports a sensation of blocked ears, which persisted for two days. She was previously prescribed Flonase and Zyrtec by another provider, which she took mostly at night due to drowsiness. However, she discontinued these medications after not noticing any improvement.  The patient also reports a significant decrease in energy, necessitating daily naps, and a deviation from her usual active lifestyle of walking three miles daily. She denies any associated pain, numbness, or tingling in the hands or feet.  The patient has not reported any nasal discharge or congestion, but does report tenderness upon palpation of the sinuses. She has not reported any neck pain or discomfort.       Medications: Outpatient Medications Prior to Visit  Medication Sig   apixaban (ELIQUIS) 2.5 MG TABS tablet Take 1 tablet (2.5 mg total) by mouth 2 (two) times daily.   conjugated estrogens (PREMARIN) vaginal cream Place 1 Applicatorful vaginally 2 (two) times a week.   Cyanocobalamin (B-12 PO) Take by mouth daily.   hydrochlorothiazide (HYDRODIURIL) 12.5 MG tablet TAKE 1 TABLET BY MOUTH EVERY DAY   metoprolol succinate (TOPROL XL) 25 MG 24 hr  tablet Take 0.5 tablets (12.5 mg total) by mouth daily.   zolpidem (AMBIEN) 5 MG tablet TAKE 1/2 TABLET (2.5 MG TOTAL) BY MOUTH AT BEDTIME AS NEEDED FOR SLEEP   No facility-administered medications prior to visit.   Review of Systems     Objective    BP 121/73 (BP Location: Right Arm, Patient Position: Sitting, Cuff Size: Normal)   Pulse 65   Ht 5\' 3"  (1.6 m)   Wt 121 lb (54.9 kg)   SpO2 99%   BMI 21.43 kg/m     Physical Exam   HEENT: Right nasal passage more congested compared to left. Sinuses tender upon palpation, no variation in tenderness between sides. NEUROLOGICAL: No numbness or tingling in hands and feet.    No results found for any visits on 11/01/23.  Assessment & Plan        Sinusitis Persistent right-sided head and eye pressure, fatigue, and nasal congestion despite conservative treatment with Flonase and Zyrtec. No fever, chills, or sweats. Right nostril appears congested on examination. -Order general blood work to rule out other potential causes of symptoms. -Prescribe z-pack, to be picked up at [facility]. -Advise patient to start taking the antibiotic while waiting for lab results.  General Health Maintenance -Advise patient to return if symptoms do not improve with antibiotic treatment.    No follow-ups on file.      Mila Merry, MD  [facility] [phone] (phone) [phone] (fax)  [facility]

## 2023-11-01 NOTE — Telephone Encounter (Signed)
  Chief Complaint: Unresolved s/s from recent OV. Symptoms: right eye pain, pressure in head,  Frequency: over 1 week Pertinent Negatives: Patient denies  Disposition: [] ED /[] Urgent Care (no appt availability in office) / [x] Appointment(In office/virtual)/ []  [facility]/ [] Home Care/ [] Refused Recommended Disposition /[] [facility]/ []  Follow-up with PCP Additional Notes: Pt was seen in office 10/22/2023. Pt states that s/s have not improved. Pt also reports fatigue. She is usually walking 2 miles a day. Pt is too tired to do that. Pt would like to be seen again.    Reason for Disposition  [1] Follow-up call from patient regarding patient's clinical status AND [2] information NON-URGENT  Answer Assessment - Initial Assessment Questions 1. REASON FOR CALL or QUESTION: "What is your reason for calling today?" or "How can I best help you?" or "What question do you have that I can help answer?"    Pt not improved from last week's office visit. 2. CALLER: Document the source of call. (e.g., laboratory, patient).     Pt  Protocols used: PCP Call - No Triage-A-AH

## 2023-11-02 LAB — CBC WITH DIFFERENTIAL/PLATELET
Basophils Absolute: 0.1 10*3/uL (ref 0.0–0.2)
Basos: 1 %
EOS (ABSOLUTE): 0.1 10*3/uL (ref 0.0–0.4)
Eos: 2 %
Hematocrit: 43.2 % (ref 34.0–46.6)
Hemoglobin: 14.2 g/dL (ref 11.1–15.9)
Immature Grans (Abs): 0 10*3/uL (ref 0.0–0.1)
Immature Granulocytes: 0 %
Lymphocytes Absolute: 1.6 10*3/uL (ref 0.7–3.1)
Lymphs: 28 %
MCH: 29.9 pg (ref 26.6–33.0)
MCHC: 32.9 g/dL (ref 31.5–35.7)
MCV: 91 fL (ref 79–97)
Monocytes Absolute: 0.4 10*3/uL (ref 0.1–0.9)
Monocytes: 8 %
Neutrophils Absolute: 3.6 10*3/uL (ref 1.4–7.0)
Neutrophils: 61 %
Platelets: 249 10*3/uL (ref 150–450)
RBC: 4.75 x10E6/uL (ref 3.77–5.28)
RDW: 12.1 % (ref 11.7–15.4)
WBC: 5.8 10*3/uL (ref 3.4–10.8)

## 2023-11-02 LAB — COMPREHENSIVE METABOLIC PANEL
ALT: 29 [IU]/L (ref 0–32)
AST: 23 [IU]/L (ref 0–40)
Albumin: 4.7 g/dL (ref 3.7–4.7)
Alkaline Phosphatase: 73 [IU]/L (ref 44–121)
BUN/Creatinine Ratio: 18 (ref 12–28)
BUN: 15 mg/dL (ref 8–27)
Bilirubin Total: 0.7 mg/dL (ref 0.0–1.2)
CO2: 25 mmol/L (ref 20–29)
Calcium: 9.6 mg/dL (ref 8.7–10.3)
Chloride: 100 mmol/L (ref 96–106)
Creatinine, Ser: 0.83 mg/dL (ref 0.57–1.00)
Globulin, Total: 2.2 g/dL (ref 1.5–4.5)
Glucose: 96 mg/dL (ref 70–99)
Potassium: 3.7 mmol/L (ref 3.5–5.2)
Sodium: 140 mmol/L (ref 134–144)
Total Protein: 6.9 g/dL (ref 6.0–8.5)
eGFR: 71 mL/min/{1.73_m2} (ref >59)

## 2023-11-02 LAB — SEDIMENTATION RATE: Sed Rate: 2 mm/h (ref 0–40)

## 2023-11-02 LAB — TSH: TSH: 1.26 u[IU]/mL (ref 0.450–4.500)

## 2023-11-08 ENCOUNTER — Observation Stay: Payer: HMO

## 2023-11-08 ENCOUNTER — Observation Stay
Admission: EM | Admit: 2023-11-08 | Discharge: 2023-11-09 | Disposition: A | Discharge status: Home / Self Care | Payer: HMO | Attending: Internal Medicine | Admitting: Internal Medicine

## 2023-11-08 ENCOUNTER — Encounter: Payer: Self-pay | Admitting: Emergency Medicine

## 2023-11-08 ENCOUNTER — Other Ambulatory Visit: Payer: Self-pay

## 2023-11-08 ENCOUNTER — Ambulatory Visit: Payer: Self-pay | Admitting: *Deleted

## 2023-11-08 ENCOUNTER — Ambulatory Visit
Admission: EM | Admit: 2023-11-08 | Discharge: 2023-11-08 | Disposition: A | Discharge status: Home / Self Care | Payer: HMO

## 2023-11-08 ENCOUNTER — Emergency Department: Payer: HMO

## 2023-11-08 DIAGNOSIS — F1090 Alcohol use, unspecified, uncomplicated: Secondary | ICD-10-CM | POA: Diagnosis not present

## 2023-11-08 DIAGNOSIS — R4701 Aphasia: Secondary | ICD-10-CM | POA: Diagnosis not present

## 2023-11-08 DIAGNOSIS — J0110 Acute frontal sinusitis, unspecified: Secondary | ICD-10-CM | POA: Diagnosis not present

## 2023-11-08 DIAGNOSIS — I480 Paroxysmal atrial fibrillation: Secondary | ICD-10-CM | POA: Diagnosis present

## 2023-11-08 DIAGNOSIS — R410 Disorientation, unspecified: Secondary | ICD-10-CM

## 2023-11-08 DIAGNOSIS — H538 Other visual disturbances: Secondary | ICD-10-CM | POA: Diagnosis not present

## 2023-11-08 DIAGNOSIS — J3489 Other specified disorders of nose and nasal sinuses: Principal | ICD-10-CM

## 2023-11-08 DIAGNOSIS — R001 Bradycardia, unspecified: Secondary | ICD-10-CM | POA: Diagnosis not present

## 2023-11-08 DIAGNOSIS — Z87891 Personal history of nicotine dependence: Secondary | ICD-10-CM | POA: Insufficient documentation

## 2023-11-08 DIAGNOSIS — R519 Headache, unspecified: Secondary | ICD-10-CM | POA: Diagnosis not present

## 2023-11-08 DIAGNOSIS — Z79899 Other long term (current) drug therapy: Secondary | ICD-10-CM | POA: Insufficient documentation

## 2023-11-08 DIAGNOSIS — I10 Essential (primary) hypertension: Secondary | ICD-10-CM | POA: Diagnosis not present

## 2023-11-08 DIAGNOSIS — Z8673 Personal history of transient ischemic attack (TIA), and cerebral infarction without residual deficits: Secondary | ICD-10-CM | POA: Diagnosis present

## 2023-11-08 DIAGNOSIS — G459 Transient cerebral ischemic attack, unspecified: Principal | ICD-10-CM | POA: Insufficient documentation

## 2023-11-08 DIAGNOSIS — H539 Unspecified visual disturbance: Secondary | ICD-10-CM

## 2023-11-08 LAB — COMPREHENSIVE METABOLIC PANEL
ALT: 18 U/L (ref 0–44)
AST: 24 U/L (ref 15–41)
Albumin: 4.3 g/dL (ref 3.5–5.0)
Alkaline Phosphatase: 64 U/L (ref 38–126)
Anion gap: 9 (ref 5–15)
BUN: 15 mg/dL (ref 8–23)
CO2: 27 mmol/L (ref 22–32)
Calcium: 9.1 mg/dL (ref 8.9–10.3)
Chloride: 103 mmol/L (ref 98–111)
Creatinine, Ser: 0.76 mg/dL (ref 0.44–1.00)
GFR, Estimated: >60 mL/min (ref >60)
Glucose, Bld: 125 mg/dL — ABNORMAL HIGH (ref 70–99)
Potassium: 3.5 mmol/L (ref 3.5–5.1)
Sodium: 139 mmol/L (ref 135–145)
Total Bilirubin: 0.7 mg/dL (ref <1.2)
Total Protein: 7 g/dL (ref 6.5–8.1)

## 2023-11-08 LAB — CBC WITH DIFFERENTIAL/PLATELET
Abs Immature Granulocytes: 0.02 10*3/uL (ref 0.00–0.07)
Basophils Absolute: 0.1 10*3/uL (ref 0.0–0.1)
Basophils Relative: 1 %
Eosinophils Absolute: 0.1 10*3/uL (ref 0.0–0.5)
Eosinophils Relative: 2 %
HCT: 42.7 % (ref 36.0–46.0)
Hemoglobin: 14.7 g/dL (ref 12.0–15.0)
Immature Granulocytes: 0 %
Lymphocytes Relative: 24 %
Lymphs Abs: 1.4 10*3/uL (ref 0.7–4.0)
MCH: 30.6 pg (ref 26.0–34.0)
MCHC: 34.4 g/dL (ref 30.0–36.0)
MCV: 89 fL (ref 80.0–100.0)
Monocytes Absolute: 0.3 10*3/uL (ref 0.1–1.0)
Monocytes Relative: 6 %
Neutro Abs: 3.9 10*3/uL (ref 1.7–7.7)
Neutrophils Relative %: 67 %
Platelets: 236 10*3/uL (ref 150–400)
RBC: 4.8 MIL/uL (ref 3.87–5.11)
RDW: 12.2 % (ref 11.5–15.5)
WBC: 5.8 10*3/uL (ref 4.0–10.5)
nRBC: 0 % (ref 0.0–0.2)

## 2023-11-08 LAB — HEMOGLOBIN A1C
Hgb A1c MFr Bld: 5.6 % (ref 4.8–5.6)
Mean Plasma Glucose: 114.02 mg/dL

## 2023-11-08 MED ORDER — SENNOSIDES-DOCUSATE SODIUM 8.6-50 MG PO TABS: 1.0000 | ORAL_TABLET | Freq: Every evening | ORAL | Status: DC | PRN

## 2023-11-08 MED ORDER — IOHEXOL 350 MG/ML SOLN
75.0000 mL | Freq: Once | INTRAVENOUS | Status: AC | PRN
  Administered 2023-11-08: 75 mL via INTRAVENOUS

## 2023-11-08 MED ORDER — SODIUM CHLORIDE 0.9 % IV BOLUS
500.0000 mL | Freq: Once | INTRAVENOUS | Status: AC
  Administered 2023-11-08: 500 mL via INTRAVENOUS

## 2023-11-08 MED ORDER — GADOBUTROL 1 MMOL/ML IV SOLN
5.0000 mL | Freq: Once | INTRAVENOUS | Status: AC | PRN
  Administered 2023-11-08: 5 mL via INTRAVENOUS

## 2023-11-08 MED ORDER — PROCHLORPERAZINE EDISYLATE 10 MG/2ML IJ SOLN
10.0000 mg | Freq: Once | INTRAMUSCULAR | Status: AC
  Administered 2023-11-08: 10 mg via INTRAVENOUS
  Filled 2023-11-08: qty 2

## 2023-11-08 MED ORDER — ACETAMINOPHEN 160 MG/5ML PO SOLN: 650.0000 mg | ORAL | Status: DC | PRN

## 2023-11-08 MED ORDER — MAGNESIUM SULFATE 2 GM/50ML IV SOLN
2.0000 g | Freq: Once | INTRAVENOUS | Status: AC
  Administered 2023-11-08: 2 g via INTRAVENOUS
  Filled 2023-11-08: qty 50

## 2023-11-08 MED ORDER — ACETAMINOPHEN 500 MG PO TABS
1000.0000 mg | ORAL_TABLET | Freq: Once | ORAL | Status: AC
  Administered 2023-11-08: 1000 mg via ORAL
  Filled 2023-11-08: qty 2

## 2023-11-08 MED ORDER — STROKE: EARLY STAGES OF RECOVERY BOOK
Freq: Once | Status: AC
Start: 2023-11-09 — End: 2023-11-09

## 2023-11-08 MED ORDER — ACETAMINOPHEN 650 MG RE SUPP: 650.0000 mg | RECTAL | Status: DC | PRN

## 2023-11-08 MED ORDER — AMOXICILLIN-POT CLAVULANATE 875-125 MG PO TABS
1.0000 | ORAL_TABLET | Freq: Two times a day (BID) | ORAL | Status: DC
  Administered 2023-11-08 – 2023-11-09 (×2): 1 via ORAL
  Filled 2023-11-08 (×2): qty 1

## 2023-11-08 MED ORDER — ZOLPIDEM TARTRATE 5 MG PO TABS: 2.5000 mg | ORAL_TABLET | Freq: Every evening | ORAL | Status: DC | PRN

## 2023-11-08 MED ORDER — ACETAMINOPHEN 325 MG PO TABS: 650.0000 mg | ORAL_TABLET | ORAL | Status: DC | PRN

## 2023-11-08 MED ORDER — APIXABAN 2.5 MG PO TABS
2.5000 mg | ORAL_TABLET | Freq: Two times a day (BID) | ORAL | Status: DC
  Administered 2023-11-08 – 2023-11-09 (×2): 2.5 mg via ORAL
  Filled 2023-11-08 (×3): qty 1

## 2023-11-08 NOTE — H&P (Signed)
History and Physical    Patient: Alexis Lyons CZY:[id number] DOB: [birth year] DOA: 11/08/2023 DOS: the patient was seen and examined on 11/08/2023 PCP: Erasmo Downer, MD  Patient coming from: Home  Chief Complaint:  Chief Complaint  Patient presents with   Headache   HPI: Alexis Lyons is a 82 y.o. female with medical history significant of atrial fibrillation on Eliquis, hypertension, who presents to the ED due to confusion.  Ms. Blaker states that for the last several weeks, she has been experiencing a sinus infection on the right with associated pressure behind her eye and right-sided headache.  She was recently started on azithromycin and did not help at all so she stopped taking it.  Then earlier today, she noticed that she developed sudden right-sided blurry vision.  She called her daughter but began to feel confused and could not express what she wanted to say to her daughter.  She notes that she cannot think of the words and could not get any words out.  The episode lasted approximately 45 minutes and then resolved.  She has not had any recurrent symptoms.  She denies any focal weakness, gait slurring of speech, facial droop.  ED course: On arrival to the ED, patient was hypertensive at 146/83 with heart rate of 65.  She was saturating at 95% on room air.  She was afebrile 98.1.Initial workup notable for normal CBC, and CMP with the exception of a glucose of 125.  CT venogram without acute abnormalities.  [facility] contacted for admission for further evaluation for TIA.  Review of Systems: As mentioned in the history of present illness. All other systems reviewed and are negative.  Past Medical History:  Diagnosis Date   Amnesia    Aphthae    Atrial fibrillation (HCC)    Congestion-fibrosis syndrome    COVID 12/12/2022   DD (diverticular disease)    GERD (gastroesophageal reflux disease)    Seizures (HCC) 12/2017   passed out due to dehydration   Past Surgical  History:  Procedure Laterality Date   ATRIAL FIBRILLATION ABLATION N/A 11/02/2021   Procedure: ATRIAL FIBRILLATION ABLATION;  Surgeon: Lanier Prude, MD;  Location: [facility];  Service: Cardiovascular;  Laterality: N/A;   CHOLECYSTECTOMY     colonoscopy with polypectomy     8/11 4 mm polyp, 12 mm polyp   COLONOSCOPY WITH PROPOFOL N/A 02/24/2018   Procedure: COLONOSCOPY WITH PROPOFOL;  Surgeon: Scot Jun, MD;  Location: [facility] END[facility];  Service: Endoscopy;  Laterality: N/A;   HERNIA REPAIR Right 2004   inguinal    HERNIA REPAIR     x's 3   Social History:  reports that she quit smoking about 37 years ago. Her smoking use included cigarettes. She started smoking about 52 years ago. She has a 7.5 pack-year smoking history. She has never used smokeless tobacco. She reports current alcohol use of about 7.0 standard drinks of alcohol per week. She reports that she does not use drugs.  Allergies  Allergen Reactions   Codeine Other (See Comments)    Messed up head really bad   Amlodipine Palpitations and Other (See Comments)    Developed flushed feeling in her face, dizziness    Family History  Problem Relation Age of Onset   Alzheimer's disease Mother    Alcohol abuse Father    Cancer Father        liver cancer   Cancer Sister        breast  Breast cancer Sister     Prior to Admission medications   Medication Sig Start Date End Date Taking? Authorizing Provider  apixaban (ELIQUIS) 2.5 MG TABS tablet Take 1 tablet (2.5 mg total) by mouth 2 (two) times daily. 09/17/23  Yes Debbe Odea, MD  conjugated estrogens (PREMARIN) vaginal cream Place 1 Applicatorful vaginally 2 (two) times a week. 02/21/23  Yes Hildred Laser, MD  hydrochlorothiazide (HYDRODIURIL) 12.5 MG tablet TAKE 1 TABLET BY MOUTH EVERY DAY 08/08/23  Yes Bacigalupo, Marzella Schlein, MD  metoprolol succinate (TOPROL XL) 25 MG 24 hr tablet Take 0.5 tablets (12.5 mg total) by mouth daily. 05/07/23  Yes  Agbor-Etang, Arlys John, MD  zolpidem (AMBIEN) 5 MG tablet TAKE 1/2 TABLET (2.5 MG TOTAL) BY MOUTH AT BEDTIME AS NEEDED FOR SLEEP 09/12/23  Yes Ostwalt, Janna, PA-C  Cyanocobalamin (B-12 PO) Take by mouth daily.    [provider]    Physical Exam: Vitals:   11/08/23 1800 11/08/23 1813 11/08/23 1830 11/08/23 1900  BP: (!) 148/90  (!) 142/87 (!) 164/85  Pulse:   (!) 59   Resp: 18  19 (!) 23  Temp:  97.8 F (36.6 C)    TempSrc:  Oral    SpO2:      Weight:      Height:       Physical Exam Vitals and nursing note reviewed.  Constitutional:      General: She is not in acute distress.    Appearance: She is normal weight.  HENT:     Head: Normocephalic and atraumatic.     Mouth/Throat:     Mouth: Mucous membranes are moist.     Pharynx: Oropharynx is clear.  Eyes:     General: No scleral icterus.    Extraocular Movements: Extraocular movements intact.     Conjunctiva/sclera: Conjunctivae normal.     Pupils: Pupils are equal, round, and reactive to light.  Cardiovascular:     Rate and Rhythm: Normal rate and regular rhythm.     Heart sounds: No murmur heard. Pulmonary:     Effort: Pulmonary effort is normal. No respiratory distress.     Breath sounds: Normal breath sounds.  Abdominal:     General: Bowel sounds are normal. There is no distension.     Palpations: Abdomen is soft.     Tenderness: There is no abdominal tenderness. There is no guarding.  Musculoskeletal:     Cervical back: Neck supple.     Right lower leg: No edema.     Left lower leg: No edema.  Skin:    General: Skin is warm and dry.  Neurological:     Mental Status: She is alert.     Comments:  Patient is alert and oriented x 4. No dysarthria or facial asymmetry Sensation grossly intact throughout 5 out of 5 strength throughout Finger-to-nose normal bilaterally Gait intact  Psychiatric:        Mood and Affect: Mood normal.        Behavior: Behavior normal.    Data Reviewed: CBC with WBC of  5.8, hemoglobin of 14.7, platelets of 236 CMP with sodium of 139, potassium 3.5, bicarb 27, glucose 125, BUN 15, creatinine 0.76, AST 24, ALT 18 and GFR above 60  EKG personally reviewed.  Sinus rhythm with rate of 49.  No ST or T wave changes concerning for acute ischemia.   CT VENOGRAM HEAD  Result Date: 11/08/2023 CLINICAL DATA:  Dural venous sinus thrombosis suspected sinus infection now vision changes  HA, r/o CVT EXAM: CT VENOGRAM HEAD TECHNIQUE: Venographic phase images of the brain were obtained following the administration of intravenous contrast. Multiplanar reformats and maximum intensity projections were generated. RADIATION DOSE REDUCTION: This exam was performed according to the departmental dose-optimization program which includes automated exposure control, adjustment of the mA and/or kV according to patient size and/or use of iterative reconstruction technique. CONTRAST:  75mL OMNIPAQUE IOHEXOL 350 MG/ML SOLN COMPARISON:  CT head 01/16/2023. FINDINGS: Brain: No evidence of acute large vascular territory infarct, acute hemorrhage, mass lesion, midline shift or hydrocephalus. Dural venous sinuses: Patent. No evidence of thrombus. Specifically, the superior sagittal, straight, sigmoid, and transverse sinuses are patent. No significant stenosis. Visualized deep cerebral veins are patent. Symmetric cavernous sinus opacification. Skull: No acute fracture. Sinuses/orbits: Mostly clear sinuses.  No acute orbital findings. IMPRESSION: No evidence of acute intracranial abnormality or dural venous sinus thrombosis. Electronically Signed   By: Feliberto Harts M.D.   On: 11/08/2023 14:58    Results are pending, will review when available.  Assessment and Plan:  * TIA (transient ischemic attack) Patient presenting with 45-minute episode of confusion, aphasia, and right blurry vision.  Differential includes TIA versus atypical migraine in the setting of sinusitis.  History of atrial fibrillation,  compliant with Eliquis.  - MRI brain pending - MRA head/neck pending - Pending results, can consult neurology - Allow for permissive HTN (systolic < 220 and diastolic < 120) pending MRI results - Echocardiogram  - A1C and Lipid panel   Paroxysmal atrial fibrillation (HCC) - Continue home Eliquis - Hold home metoprolol given slow ventricular rate  Acute frontal sinusitis Patient reports no improvement with azithromycin, which is not surprising given high resistance rates.   - Start Augmentin twice daily with recommendation to complete 7-10-day course  Essential hypertension - Hold home HCTZ pending MRI results  Advance Care Planning:   Code Status: Full Code   Consults: None  Family Communication: No family at bedside  Severity of Illness: The appropriate patient status for this patient is OBSERVATION. Observation status is judged to be reasonable and necessary in order to provide the required intensity of service to ensure the patient's safety. The patient's presenting symptoms, physical exam findings, and initial radiographic and laboratory data in the context of their medical condition is felt to place them at decreased risk for further clinical deterioration. Furthermore, it is anticipated that the patient will be medically stable for discharge from the hospital within 2 midnights of admission.   Author: Verdene Lennert, MD 11/08/2023 7:16 PM  For on call review [email or web address].

## 2023-11-08 NOTE — Assessment & Plan Note (Signed)
Patient presenting with 45-minute episode of confusion, aphasia, and right blurry vision.  Differential includes TIA versus atypical migraine in the setting of sinusitis.  History of atrial fibrillation, compliant with Eliquis.  - MRI brain pending - MRA head/neck pending - Pending results, can consult neurology - Allow for permissive HTN (systolic < 220 and diastolic < 120) pending MRI results - Echocardiogram  - A1C and Lipid panel

## 2023-11-08 NOTE — Assessment & Plan Note (Signed)
-   Continue home Eliquis - Hold home metoprolol given slow ventricular rate

## 2023-11-08 NOTE — Telephone Encounter (Signed)
Patient at ED

## 2023-11-08 NOTE — Assessment & Plan Note (Signed)
Patient reports no improvement with azithromycin, which is not surprising given high resistance rates.   - Start Augmentin twice daily with recommendation to complete 7-10-day course

## 2023-11-08 NOTE — ED Notes (Signed)
Patient ambulated to hallway bathroom with a steady gait. No neuro deficits noted.

## 2023-11-08 NOTE — ED Notes (Signed)
Patient states she had a period of approximately 45 minutes that she had trouble with speech and became very confused while speaking to her daughter on the phone which was at 830 this morning. Patient also c/o blurry vision in right eye this morning that also lasted 45 minutes. Dr. Modesto Charon aware. Symptoms have resolved.

## 2023-11-08 NOTE — ED Provider Notes (Signed)
[facility] Provider Note    Event Date/Time   First MD Initiated Contact with Patient 11/08/23 1313     (approximate)   History   Headache   HPI  Alexis Lyons is a 82 y.o. female   Past medical history of atrial fibrillation on Eliquis, here with several weeks of sinus congestion, right frontal headache, and had just finished a course of antibiotics for sinus infection earlier this week but continues to have right-sided headache.  However this morning she developed new neurologic symptom around 8:30 AM, awoke feeling at baseline but then had blurry vision on the right side of her eye and difficulty forming words.  She called her daughter and on the phone the daughter noted that the patient appeared confused, difficulty forming sentences.  This lasted about half an hour and then completely resolved.  She feels well now except for ongoing right sided headache and sinus congestion.  She denies fevers or chills.  She had no motor or sensory changes during this episode.  She has never had a stroke or TIA.  She has been compliant with her Eliquis and last dose was this morning.  Independent Historian contributed to assessment above: Her daughter is at bedside to corroborate information past medical history as above   Physical Exam   Triage Vital Signs: ED Triage Vitals  Encounter Vitals Group     BP 11/08/23 1225 (!) 172/97     Systolic BP Percentile --      Diastolic BP Percentile --      Pulse Rate 11/08/23 1225 65     Resp 11/08/23 1225 17     Temp 11/08/23 1225 98.1 F (36.7 C)     Temp Source 11/08/23 1225 Oral     SpO2 11/08/23 1225 95 %     Weight 11/08/23 1226 120 lb (54.4 kg)     Height 11/08/23 1226 5\' 3"  (1.6 m)     Head Circumference --      Peak Flow --      Pain Score 11/08/23 1225 7     Pain Loc --      Pain Education --      Exclude from Growth Chart --     Most recent vital signs: Vitals:   11/08/23 1225 11/08/23 1314  BP:  (!) 172/97 (!) 164/64  Pulse: 65 60  Resp: 17 16  Temp: 98.1 F (36.7 C)   SpO2: 95% 98%    General: Awake, no distress.  CV:  Good peripheral perfusion.  Resp:  Normal effort.  Abd:  No distention.  Other:  Appearing slightly hypertensive normal vital signs afebrile.  No temporal tenderness.  Visual fields intact, as are extra ocular movements.  No motor or sensory deficits finger-to-nose is normal.  Neck is supple with full range of motion.   ED Results / Procedures / Treatments   Labs (all labs ordered are listed, but only abnormal results are displayed) Labs Reviewed  COMPREHENSIVE METABOLIC PANEL - Abnormal; Notable for the following components:      Result Value   Glucose, Bld 125 (*)    All other components within normal limits  CBC WITH DIFFERENTIAL/PLATELET     I ordered and reviewed the above labs they are notable for cell counts electrolytes unremarkable.  ED ECG REPORT I, Pilar Jarvis, the attending physician, personally viewed and interpreted this ECG.   Date: 11/08/2023  EKG Time: 113  Rate: 49  Rhythm: sinus bradycardia  Axis: nl  Intervals:none  ST&T Change: no stemi    RADIOLOGY I independently reviewed and interpreted CTV of the head I do not see any bleeding or midline shift I also reviewed radiologist's formal read.   PROCEDURES:  Critical Care performed: No  Procedures   MEDICATIONS ORDERED IN ED: Medications  prochlorperazine (COMPAZINE) injection 10 mg (has no administration in time range)  acetaminophen (TYLENOL) tablet 1,000 mg (has no administration in time range)  sodium chloride 0.9 % bolus 500 mL (has no administration in time range)  magnesium sulfate IVPB 2 g 50 mL (has no administration in time range)  iohexol (OMNIPAQUE) 350 MG/ML injection 75 mL (75 mLs Intravenous Contrast Given 11/08/23 1440)    External physician / consultants:  I spoke with hospitalist for admission and regarding care plan for this patient.    IMPRESSION / MDM / ASSESSMENT AND PLAN / ED COURSE  I reviewed the triage vital signs and the nursing notes.                                Patient's presentation is most consistent with acute presentation with potential threat to life or bodily function.  Differential diagnosis includes, but is not limited to, CVT, head bleed, sinusitis, TIA or stroke, temporal arteritis, complex migraine   The patient is on the cardiac monitor to evaluate for evidence of arrhythmia and/or significant heart rate changes.  MDM:    Is a patient with sinus infection frontal headache for the last several weeks with transient neurologic symptoms including blurry vision and expressive aphasia that have completely resolved.  She is on blood thinners, not thrombolytic clinic candidate given completely resolved symptoms and on blood thinner.  Concern for TIA.  Also concern for CVT given sinus infection and neurologic complaints worsening headache.  Start with CT venogram.  If normal, plan will be for migraine cocktail & admission for TIA workup.         FINAL CLINICAL IMPRESSION(S) / ED DIAGNOSES   Final diagnoses:  Sinus pressure  Sinus headache  Transient vision disturbance  Aphasia     Rx / DC Orders   ED Discharge Orders     None        Note:  This document was prepared using Dragon voice recognition software and may include unintentional dictation errors.    Pilar Jarvis, MD 11/08/23 1520

## 2023-11-08 NOTE — ED Notes (Signed)
Report given to Samantha Crimes RN

## 2023-11-08 NOTE — ED Triage Notes (Signed)
Pt states she is having sinus pressure and pain with headache and pain behind her right eye x 3 weeks. Pt has tried antibiotics, Flonase and multiple medications that have not helped so far.   Pt states this morning at 9 am she was reading and her vision went blurry in her right eye with confusion for 45 minuets then went back to normal.

## 2023-11-08 NOTE — ED Notes (Signed)
Patient ambulated with steady gait to hallway bathroom

## 2023-11-08 NOTE — ED Triage Notes (Addendum)
Pt sts that she has been trying to get over a sinus headache. Pt sts that she has been on abx for a couple of weeks now and she is still having right eye pain and right sided head aches. Pt sts that her right eye is very sore and is having mild blurred vision in the eye. Pt denies any weakness. Pt is A/Ox4, NAD, speech is clear.

## 2023-11-08 NOTE — Telephone Encounter (Signed)
Chief Complaint: headache and worsening right eye pain Symptoms: headache comes and goes. Right eye pain worse since yesterday soreness. Completed z pack Tuesday for sinus issues. Issues ongoing and has seen multiple different providers in office without relief Frequency: 1 month eye pain yesterday  Pertinent Negatives: Patient denies blurred vision no double vision, no N/T or weakness on either side of body Disposition: [] ED /[] Urgent Care (no appt availability in office) / [] Appointment(In office/virtual)/ []  [facility]/ [] Home Care/ [] Refused Recommended Disposition /[] [facility]/ [x]  Follow-up with PCP Additional Notes:   Attempted to scheduled appt with E. Suzie Portela  this am and would not allow.  Patient requesting if appt can be scheduled with PCP due to seeing multiple other providers in office and symptoms continue and are worsening in right eye. Recommended if pain continues after care advise go to UC. Please advise.   Summary: Poor vision, headache   Pt called reporting that she is having poor vision in her right eye, has a headache on that side as well  Best contact: [phone]          Reason for Disposition  [1] MILD-MODERATE headache AND [2] present > 72 hours  Eye pain present > 24 hours  Answer Assessment - Initial Assessment Questions 1. LOCATION: "Where does it hurt?"      Right side of head like pressure headache  2. ONSET: "When did the headache start?" (Minutes, hours or days)      1 month  3. PATTERN: "Does the pain come and go, or has it been constant since it started?"     Comes and goes  4. SEVERITY: "How bad is the pain?" and "What does it keep you from doing?"  (e.g., Scale 1-10; mild, moderate, or severe)   - MILD (1-3): doesn't interfere with normal activities    - MODERATE (4-7): interferes with normal activities or awakens from sleep    - SEVERE (8-10): excruciating pain, unable to do any normal activities        Pain level 5   5. RECURRENT SYMPTOM: "Have you ever had headaches before?" If Yes, ask: "When was the last time?" and "What happened that time?"      Yes treated with z pack but not better 6. CAUSE: "What do you think is causing the headache?"     Not sure  7. MIGRAINE: "Have you been diagnosed with migraine headaches?" If Yes, ask: "Is this headache similar?"      na 8. HEAD INJURY: "Has there been any recent injury to the head?"      na 9. OTHER SYMPTOMS: "Do you have any other symptoms?" (fever, stiff neck, eye pain, sore throat, cold symptoms)     Right eye pain worsening , headache right side persists  10. PREGNANCY: "Is there any chance you are pregnant?" "When was your last menstrual period?"       na  Answer Assessment - Initial Assessment Questions 1. ONSET: "When did the pain start?" (e.g., minutes, hours, days)     Worsening yesterday  2. TIMING: "Does the pain come and go, or has it been constant since it started?" (e.g., constant, intermittent, fleeting)     Comes and goes  3. SEVERITY: "How bad is the pain?"   (Scale 1-10; mild, moderate or severe)   - MILD (1-3): doesn't interfere with normal activities    - MODERATE (4-7): interferes with normal activities or awakens from sleep    - SEVERE (8-10): excruciating  pain and patient unable to do normal activities     5 4. LOCATION: "Where does it hurt?"  (e.g., eyelid, eye, cheekbone)     Right eye 5. CAUSE: "What do you think is causing the pain?"     Not sure s/p sinus issues 6. VISION: "Do you have blurred vision or changes in your vision?"      No blurred vision only soreness in right eye  7. EYE DISCHARGE: "Is there any discharge (pus) from the eye(s)?"  If Yes, ask: "What color is it?"      Na  8. FEVER: "Do you have a fever?" If Yes, ask: "What is it, how was it measured, and when did it start?"      Na  9. OTHER SYMPTOMS: "Do you have any other symptoms?" (e.g., headache, nasal discharge, facial rash)     Headache right side   10. PREGNANCY: "Is there any chance you are pregnant?" "When was your last menstrual period?"       Na  Protocols used: Headache-A-AH, Eye Pain and Other Symptoms-A-AH

## 2023-11-08 NOTE — ED Notes (Signed)
This RN transported pt to room.  Receiving RN in pt room upon arrival.  Receiving RN had no questions.

## 2023-11-08 NOTE — ED Provider Notes (Signed)
Patient presents for evaluation due to persistent right eye pain and right-sided headache occurring for 3 weeks.  Had an episode of bilateral blurred vision, confusion and inability to form words that occurred today lasting for approximately 45 minutes before spontaneous resolution.  All symptoms have resolved.  Has been evaluated 3 times over the past few weeks for sinusitis, treated with azithromycin, last dose 3 days ago, Flonase and Zyrtec with no improvement.  Tylenol does help to minimize pain but has not been taking consistently.  Due to episode today of blurred vision, confusion and speech changes which have not occurred before she is being sent to the nearest emergency department for evaluation, to rule out CVA or brain involvement.  Vitals are stable at this time and therefore she may escort herself.   Valinda Hoar, NP 11/08/23 1157

## 2023-11-08 NOTE — ED Notes (Signed)
Patient is being discharged from the Urgent Care and sent to the Emergency Department via [facility] . Per provider A.White, patient is in need of higher level of care due to stroke like symptoms this morning. Patient is aware and verbalizes understanding of plan of care.  Vitals:   11/08/23 1141  BP: (!) 146/83  Pulse: 65  Resp: 18  Temp: 97.7 F (36.5 C)  SpO2: 94%

## 2023-11-08 NOTE — Assessment & Plan Note (Signed)
-   Hold home HCTZ pending MRI results

## 2023-11-09 ENCOUNTER — Observation Stay (HOSPITAL_BASED_OUTPATIENT_CLINIC_OR_DEPARTMENT_OTHER)
Admit: 2023-11-09 | Discharge: 2023-11-09 | Disposition: A | Discharge status: Home / Self Care | Payer: HMO | Attending: Internal Medicine | Admitting: Internal Medicine

## 2023-11-09 DIAGNOSIS — G459 Transient cerebral ischemic attack, unspecified: Secondary | ICD-10-CM

## 2023-11-09 LAB — BASIC METABOLIC PANEL
Anion gap: 6 (ref 5–15)
BUN: 13 mg/dL (ref 8–23)
CO2: 28 mmol/L (ref 22–32)
Calcium: 8.7 mg/dL — ABNORMAL LOW (ref 8.9–10.3)
Chloride: 104 mmol/L (ref 98–111)
Creatinine, Ser: 0.73 mg/dL (ref 0.44–1.00)
GFR, Estimated: >60 mL/min (ref >60)
Glucose, Bld: 110 mg/dL — ABNORMAL HIGH (ref 70–99)
Potassium: 3.8 mmol/L (ref 3.5–5.1)
Sodium: 138 mmol/L (ref 135–145)

## 2023-11-09 LAB — ECHOCARDIOGRAM LIMITED BUBBLE STUDY
AR max vel: 2.49 cm2
AV Area VTI: 3 cm2
AV Area mean vel: 2.42 cm2
AV Mean grad: 3 mm[Hg]
AV Peak grad: 5.3 mm[Hg]
Ao pk vel: 1.15 m/s
Area-P 1/2: 3.58 cm2
MV VTI: 1.99 cm2
S' Lateral: 2.7 cm

## 2023-11-09 LAB — LIPID PANEL
Cholesterol: 175 mg/dL (ref 0–200)
HDL: 59 mg/dL (ref >40)
LDL Cholesterol: 103 mg/dL — ABNORMAL HIGH (ref 0–99)
Total CHOL/HDL Ratio: 3 {ratio}
Triglycerides: 67 mg/dL (ref <150)
VLDL: 13 mg/dL (ref 0–40)

## 2023-11-09 MED ORDER — ATORVASTATIN CALCIUM 20 MG PO TABS
40.0000 mg | ORAL_TABLET | Freq: Every day | ORAL | Status: DC
  Administered 2023-11-09: 40 mg via ORAL
  Filled 2023-11-09: qty 2

## 2023-11-09 MED ORDER — ATORVASTATIN CALCIUM 40 MG PO TABS: 40.0000 mg | ORAL_TABLET | Freq: Every day | ORAL | 0 refills | Status: DC

## 2023-11-09 NOTE — Progress Notes (Signed)
  Echocardiogram 2D Echocardiogram has been performed.  Alexis Lyons 11/09/2023, 10:17 AM

## 2023-11-09 NOTE — Plan of Care (Signed)
  Problem: Education: Goal: Knowledge of disease or condition will improve 11/09/2023 0425 by Lannette Donath, RN Outcome: Progressing 11/08/2023 2252 by Lannette Donath, RN Outcome: Progressing Goal: Knowledge of secondary prevention will improve (MUST DOCUMENT ALL) 11/09/2023 0425 by Lannette Donath, RN Outcome: Progressing 11/08/2023 2252 by Lannette Donath, RN Outcome: Progressing Goal: Knowledge of patient specific risk factors will improve Alexis Lyons N/A or DELETE if not current risk factor) 11/09/2023 0425 by Lannette Donath, RN Outcome: Progressing 11/08/2023 2252 by Lannette Donath, RN Outcome: Progressing   Problem: Ischemic Stroke/TIA Tissue Perfusion: Goal: Complications of ischemic stroke/TIA will be minimized 11/09/2023 0425 by Lannette Donath, RN Outcome: Progressing 11/08/2023 2252 by Lannette Donath, RN Outcome: Progressing   Problem: Health Behavior/Discharge Planning: Goal: Ability to manage health-related needs will improve 11/09/2023 0425 by Lannette Donath, RN Outcome: Progressing 11/08/2023 2252 by Lannette Donath, RN Outcome: Progressing Goal: Goals will be collaboratively established with patient/family 11/09/2023 0425 by Lannette Donath, RN Outcome: Progressing 11/08/2023 2252 by Lannette Donath, RN Outcome: Progressing   Problem: Self-Care: Goal: Ability to participate in self-care as condition permits will improve 11/09/2023 0425 by Lannette Donath, RN Outcome: Progressing 11/08/2023 2252 by Lannette Donath, RN Outcome: Progressing Goal: Verbalization of feelings and concerns over difficulty with self-care will improve 11/09/2023 0425 by Lannette Donath, RN Outcome: Progressing 11/08/2023 2252 by Lannette Donath, RN Outcome: Progressing Goal: Ability to communicate needs accurately will improve 11/09/2023 0425 by Lannette Donath, RN Outcome: Progressing 11/08/2023 2252 by Lannette Donath, RN Outcome: Progressing   Problem: Nutrition: Goal: Risk of aspiration  will decrease 11/09/2023 0425 by Lannette Donath, RN Outcome: Progressing 11/08/2023 2252 by Lannette Donath, RN Outcome: Progressing Goal: Dietary intake will improve 11/09/2023 0425 by Lannette Donath, RN Outcome: Progressing 11/08/2023 2252 by Lannette Donath, RN Outcome: Progressing   Problem: Education: Goal: Knowledge of General Education information will improve Description: Including pain rating scale, medication(s)/side effects and non-pharmacologic comfort measures 11/09/2023 0425 by Lannette Donath, RN Outcome: Progressing 11/08/2023 2252 by Lannette Donath, RN Outcome: Progressing   Problem: Health Behavior/Discharge Planning: Goal: Ability to manage health-related needs will improve 11/09/2023 0425 by Lannette Donath, RN Outcome: Progressing 11/08/2023 2252 by Lannette Donath, RN Outcome: Progressing   Problem: Clinical Measurements: Goal: Ability to maintain clinical measurements within normal limits will improve 11/09/2023 0425 by Lannette Donath, RN Outcome: Progressing 11/08/2023 2252 by Lannette Donath, RN Outcome: Progressing Goal: Will remain free from infection 11/09/2023 0425 by Lannette Donath, RN Outcome: Progressing 11/08/2023 2252 by Lannette Donath, RN Outcome: Progressing Goal: Diagnostic test results will improve 11/09/2023 0425 by Lannette Donath, RN Outcome: Progressing 11/08/2023 2252 by Lannette Donath, RN Outcome: Progressing Goal: Respiratory complications will improve 11/09/2023 0425 by Lannette Donath, RN Outcome: Progressing 11/08/2023 2252 by Lannette Donath, RN Outcome: Progressing Goal: Cardiovascular complication will be avoided 11/09/2023 0425 by Lannette Donath, RN Outcome: Progressing 11/08/2023 2252 by Lannette Donath, RN Outcome: Progressing   Problem: Activity: Goal: Risk for activity intolerance will decrease 11/09/2023 0425 by Lannette Donath, RN Outcome: Progressing 11/08/2023 2252 by Lannette Donath, RN Outcome: Progressing    Problem: Pain Management: Goal: General experience of comfort will improve 11/09/2023 0425 by Lannette Donath, RN Outcome: Progressing 11/08/2023 2252 by Lannette Donath, RN Outcome: Progressing

## 2023-11-09 NOTE — Progress Notes (Signed)
Patient received discharge instructions, IV removed.  VS stable. Patient picked up by her son-in-law Arline Asp.

## 2023-11-09 NOTE — Discharge Summary (Signed)
Physician Discharge Summary   Patient: Alexis Lyons MRN: [id number] DOB: [birth year]  Admit date:     11/08/2023  Discharge date: 11/09/23  Discharge Physician: Lurene Shadow   PCP: Erasmo Downer, MD   Recommendations at discharge:   Follow-up with PCP in 1 week Follow-up with [facility] neurology in 1 week.  Discharge Diagnoses: Principal Problem:   TIA (transient ischemic attack) Active Problems:   Paroxysmal atrial fibrillation (HCC)   Acute frontal sinusitis   Essential hypertension  Resolved Problems:   * No resolved hospital problems. *  Hospital Course:  Alexis Lyons is a 82 y.o. female with medical history significant for atrial fibrillation on Eliquis, hypertension, GERD, recent acute sinusitis, was treated with Flonase, Zyrtec and Z-Pak.  She presented to the hospital because of sudden onset of pain in the right eye, blurred vision, confusion and expressive aphasia.  She was reading a book when she developed right eye pain and blurry vision.  She picked up her phone to call her daughter about her symptoms and she noticed that she was confused and she could not get her words out.  Symptoms lasted about 45 minutes.  All her symptoms are resolved by the time she got to the emergency department.   Assessment and Plan:   TIA: MRI brain did not show any evidence of acute stroke.  MRA head and neck did not show any evidence of large vessel occlusion. 2D echo did not show any evidence of intracardiac thrombus.  EF estimated at 60 to 65%. Continue Eliquis.   Case was discussed with Dr. Selina Cooley, neurologist on-call.  No need for additional antiplatelet therapy at this time. Patient advised to follow-up with neurologist as an outpatient.   Please note atrial fibrillation: Continue Eliquis.  EKG showed sinus bradycardia. History of ablation about 2 years ago.   Acute frontal sinusitis: Recently completed Flonase, Zyrtec and Z-Pak. No fever, colored nasal secretions or  facial tenderness. She will not be discharged on antibiotics.  Continue symptomatic treatment with Flonase and analgesics.  Outpatient follow-up with PCP or ENT specialist if symptoms do not improve      Consultants: None Procedures performed: None Disposition: Home Diet recommendation:  Cardiac diet DISCHARGE MEDICATION: Allergies as of 11/09/2023       Reactions   Codeine Other (See Comments)   Messed up head really bad   Amlodipine Palpitations, Other (See Comments)   Developed flushed feeling in her face, dizziness        Medication List     TAKE these medications    apixaban 2.5 MG Tabs tablet Commonly known as: ELIQUIS Take 1 tablet (2.5 mg total) by mouth 2 (two) times daily.   atorvastatin 40 MG tablet Commonly known as: LIPITOR Take 1 tablet (40 mg total) by mouth daily. Start taking on: November 10, 2023   B-12 PO Take by mouth daily.   hydrochlorothiazide 12.5 MG tablet Commonly known as: HYDRODIURIL TAKE 1 TABLET BY MOUTH EVERY DAY   metoprolol succinate 25 MG 24 hr tablet Commonly known as: Toprol XL Take 0.5 tablets (12.5 mg total) by mouth daily.   Premarin vaginal cream Generic drug: conjugated estrogens Place 1 Applicatorful vaginally 2 (two) times a week.   zolpidem 5 MG tablet Commonly known as: AMBIEN TAKE 1/2 TABLET (2.5 MG TOTAL) BY MOUTH AT BEDTIME AS NEEDED FOR SLEEP        Follow-up Information     [facility]. Schedule an appointment as soon  as possible for a visit in 1 week(s).   Contact information: [address] [place] [address] [phone]               Discharge Exam: Ceasar Mons Weights   11/08/23 1226 11/08/23 2000  Weight: 54.4 kg 55.7 kg   GEN: NAD SKIN: Warm and dry EYES: No pallor or icterus, EOMI, PERRLA, no nystagmus ENT: MMM, no facial tenderness CV: RRR PULM: CTA B ABD: soft, ND, NT, +BS CNS: AAO x 3, non focal EXT: No edema or  tenderness   Condition at discharge: good  The results of significant diagnostics from this hospitalization (including imaging, microbiology, ancillary and laboratory) are listed below for reference.   Imaging Studies: ECHOCARDIOGRAM LIMITED BUBBLE STUDY  Result Date: 11/09/2023    ECHOCARDIOGRAM LIMITED REPORT   Patient Name:   Alexis Lyons Date of Exam: 11/09/2023 Medical Rec #:  [id number]        Height:       63.0 in Accession #:    [id number]       Weight:       122.8 lb Date of Birth:  07-10-1941        BSA:          1.572 m Patient Age:    82 years         BP:           132/82 mmHg Patient Gender: F                HR:           71 bpm. Exam Location:  [facility] Procedure: 2D Echo, Color Doppler, Cardiac Doppler and Saline Contrast Bubble            Study Indications:     Stroke 434.91 / I63.9  History:         Patient has prior history of Echocardiogram examinations.                  Arrythmias:Atrial Fibrillation.  Sonographer:     Neysa Bonito Roar Referring Phys:  [id number] Verdene Lennert Diagnosing Phys: Armanda Magic MD IMPRESSIONS  1. Left ventricular ejection fraction, by estimation, is 60 to 65%. The left ventricle has normal function. The left ventricle has no regional wall motion abnormalities. Left ventricular diastolic parameters are indeterminate.  2. Right ventricular systolic function is normal. The right ventricular size is normal. There is normal pulmonary artery systolic pressure. The estimated right ventricular systolic pressure is 33.9 mmHg.  3. The mitral valve is normal in structure. Trivial mitral valve regurgitation. No evidence of mitral stenosis.  4. The aortic valve is normal in structure. Aortic valve regurgitation is not visualized. No aortic stenosis is present.  5. The inferior vena cava is normal in size with greater than 50% respiratory variability, suggesting right atrial pressure of 3 mmHg. Conclusion(s)/Recommendation(s): No intracardiac source of embolism detected on this  transthoracic study. Consider a transesophageal echocardiogram to exclude cardiac source of embolism if clinically indicated. FINDINGS  Left Ventricle: Left ventricular ejection fraction, by estimation, is 60 to 65%. The left ventricle has normal function. The left ventricle has no regional wall motion abnormalities. The left ventricular internal cavity size was normal in size. There is  no left ventricular hypertrophy. Left ventricular diastolic parameters are indeterminate. Normal left ventricular filling pressure. Right Ventricle: The right ventricular size is normal. No increase in right ventricular wall thickness. Right ventricular systolic function is normal. There is normal pulmonary  artery systolic pressure. The tricuspid regurgitant velocity is 2.78 m/s, and  with an assumed right atrial pressure of 3 mmHg, the estimated right ventricular systolic pressure is 33.9 mmHg. Left Atrium: Left atrial size was normal in size. Right Atrium: Right atrial size was normal in size. Pericardium: Trivial pericardial effusion is present. The pericardial effusion is circumferential. Mitral Valve: The mitral valve is normal in structure. Trivial mitral valve regurgitation. No evidence of mitral valve stenosis. MV peak gradient, 3.6 mmHg. The mean mitral valve gradient is 2.0 mmHg. Tricuspid Valve: The tricuspid valve is normal in structure. Tricuspid valve regurgitation is mild . No evidence of tricuspid stenosis. Aortic Valve: The aortic valve is normal in structure. Aortic valve regurgitation is not visualized. No aortic stenosis is present. Aortic valve mean gradient measures 3.0 mmHg. Aortic valve peak gradient measures 5.3 mmHg. Aortic valve area, by VTI measures 3.00 cm. Pulmonic Valve: The pulmonic valve was normal in structure. Pulmonic valve regurgitation is trivial. No evidence of pulmonic stenosis. Aorta: The aortic root is normal in size and structure. Venous: The inferior vena cava is normal in size with  greater than 50% respiratory variability, suggesting right atrial pressure of 3 mmHg. IAS/Shunts: No atrial level shunt detected by color flow Doppler. Agitated saline contrast was given intravenously to evaluate for intracardiac shunting. Additional Comments: Spectral Doppler performed. Color Doppler performed.  LEFT VENTRICLE PLAX 2D LVIDd:         3.90 cm   Diastology LVIDs:         2.70 cm   LV e' medial:    7.40 cm/s LV PW:         1.00 cm   LV E/e' medial:  12.3 LV IVS:        1.00 cm   LV e' lateral:   7.18 cm/s LVOT diam:     1.90 cm   LV E/e' lateral: 12.6 LV SV:         62 LV SV Index:   39 LVOT Area:     2.84 cm  RIGHT VENTRICLE RV Basal diam:  2.80 cm RV Mid diam:    2.30 cm RV S prime:     11.20 cm/s TAPSE (M-mode): 2.0 cm LEFT ATRIUM             Index        RIGHT ATRIUM           Index LA diam:        3.60 cm 2.29 cm/m   RA Area:     15.10 cm LA Vol (A2C):   40.4 ml 25.71 ml/m  RA Volume:   32.90 ml  20.93 ml/m LA Vol (A4C):   43.3 ml 27.55 ml/m LA Biplane Vol: 41.9 ml 26.66 ml/m  AORTIC VALVE                    PULMONIC VALVE AV Area (Vmax):    2.49 cm     PV Vmax:          0.80 m/s AV Area (Vmean):   2.42 cm     PV Peak grad:     2.6 mmHg AV Area (VTI):     3.00 cm     PR End Diast Vel: 4.93 msec AV Vmax:           115.00 cm/s  RVOT Peak grad:   1 mmHg AV Vmean:          75.100 cm/s AV VTI:  0.206 m AV Peak Grad:      5.3 mmHg AV Mean Grad:      3.0 mmHg LVOT Vmax:         101.00 cm/s LVOT Vmean:        64.000 cm/s LVOT VTI:          0.218 m LVOT/AV VTI ratio: 1.06  AORTA Ao Root diam: 2.50 cm Ao Asc diam:  3.70 cm MITRAL VALVE               TRICUSPID VALVE MV Area (PHT): 3.58 cm    TR Peak grad:   30.9 mmHg MV Area VTI:   1.99 cm    TR Vmax:        278.00 cm/s MV Peak grad:  3.6 mmHg MV Mean grad:  2.0 mmHg    SHUNTS MV Vmax:       0.95 m/s    Systemic VTI:  0.22 m MV Vmean:      64.7 cm/s   Systemic Diam: 1.90 cm MV Decel Time: 212 msec MV E velocity: 90.70 cm/s MV A  velocity: 84.90 cm/s MV E/A ratio:  1.07 MV A Prime:    12.5 cm/s Armanda Magic MD Electronically signed by Armanda Magic MD Signature Date/Time: 11/09/2023/4:08:46 PM    Final (Updated)    MR BRAIN WO CONTRAST  Result Date: 11/08/2023 CLINICAL DATA:  Right eye soreness, blurred vision EXAM: MRI HEAD WITHOUT CONTRAST MRA HEAD WITHOUT CONTRAST MRA NECK WITHOUT AND WITH CONTRAST TECHNIQUE: Multiplanar, multi-echo pulse sequences of the brain and surrounding structures were acquired without intravenous contrast. Angiographic images of the Circle of Willis were acquired using MRA technique without intravenous contrast. Angiographic images of the neck were acquired using MRA technique without and with intravenous contrast. Carotid stenosis measurements (when applicable) are obtained utilizing NASCET criteria, using the distal internal carotid diameter as the denominator. CONTRAST:  5mL GADAVIST GADOBUTROL 1 MMOL/ML IV SOLN COMPARISON:  08/16/2022 MRI head, 06/05/2008 MRA head, no prior MRA neck FINDINGS: MRI HEAD FINDINGS Brain: No restricted diffusion to suggest acute or subacute infarct. Diffuse dural thickening versus a trace circumferential subdural collection overlying both cerebral and cerebellar hemispheres (series 12, image 13 and 30); evaluation is somewhat limited by the absence of intravenous contrast. This was not present on the prior exam. No acute hemorrhage, mass, mass effect, or midline shift. No hydrocephalus. Pituitary and craniocervical junction within normal limits. No hemosiderin deposition to suggest remote hemorrhage. Vascular: Normal arterial flow voids. Skull and upper cervical spine: Normal marrow signal. Sinuses/Orbits: Mucous retention cysts in the maxillary sinuses. Status post bilateral lens replacements. Other: The mastoid air cells are well aerated. MRA HEAD FINDINGS Anterior circulation: Both internal carotid arteries are patent to the termini, without significant stenosis. A1 segments  patent. Normal anterior communicating artery. Anterior cerebral arteries are patent to their distal aspects without significant stenosis. No M1 stenosis or occlusion. Distal MCA branches perfused to their distal aspects without significant stenosis. Posterior circulation: Vertebral arteries patent to the vertebrobasilar junction without stenosis. Posterior inferior cerebral arteries patent bilaterally. Basilar patent to its distal aspect. Superior cerebellar arteries patent proximally. Patent P1 segments. PCAs perfused to their distal aspects without significant stenosis. The bilateral posterior communicating arteries are not visualized. Anatomic variants: None significant MRA NECK FINDINGS Aortic arch: Standard aortic branching. No evidence of dissection or aneurysm in the imaged aorta. Right carotid system: No evidence of dissection, occlusion, or hemodynamically significant stenosis (greater than 50%). Left carotid system: No evidence  of dissection, occlusion, or hemodynamically significant stenosis (greater than 50%). Vertebral arteries: No evidence of dissection, occlusion, or hemodynamically significant stenosis (greater than 50%). Other: None IMPRESSION: 1. No acute intracranial process. No evidence of acute or subacute infarct. 2. Suspect diffuse dural thickening, with a trace circumferential subdural collection overlying both cerebral and cerebellar hemispheres felt to be less likely, although evaluation is limited by the absence of intravenous contrast; this was not present on the prior exam. Consider MRI with contrast for further evaluation. 3. No intracranial large vessel occlusion or significant stenosis. 4. No hemodynamically significant stenosis in the neck. Electronically Signed   By: Wiliam Ke M.D.   On: 11/08/2023 23:31   MR ANGIO HEAD WO CONTRAST  Result Date: 11/08/2023 CLINICAL DATA:  Right eye soreness, blurred vision EXAM: MRI HEAD WITHOUT CONTRAST MRA HEAD WITHOUT CONTRAST MRA NECK  WITHOUT AND WITH CONTRAST TECHNIQUE: Multiplanar, multi-echo pulse sequences of the brain and surrounding structures were acquired without intravenous contrast. Angiographic images of the Circle of Willis were acquired using MRA technique without intravenous contrast. Angiographic images of the neck were acquired using MRA technique without and with intravenous contrast. Carotid stenosis measurements (when applicable) are obtained utilizing NASCET criteria, using the distal internal carotid diameter as the denominator. CONTRAST:  5mL GADAVIST GADOBUTROL 1 MMOL/ML IV SOLN COMPARISON:  08/16/2022 MRI head, 06/05/2008 MRA head, no prior MRA neck FINDINGS: MRI HEAD FINDINGS Brain: No restricted diffusion to suggest acute or subacute infarct. Diffuse dural thickening versus a trace circumferential subdural collection overlying both cerebral and cerebellar hemispheres (series 12, image 13 and 30); evaluation is somewhat limited by the absence of intravenous contrast. This was not present on the prior exam. No acute hemorrhage, mass, mass effect, or midline shift. No hydrocephalus. Pituitary and craniocervical junction within normal limits. No hemosiderin deposition to suggest remote hemorrhage. Vascular: Normal arterial flow voids. Skull and upper cervical spine: Normal marrow signal. Sinuses/Orbits: Mucous retention cysts in the maxillary sinuses. Status post bilateral lens replacements. Other: The mastoid air cells are well aerated. MRA HEAD FINDINGS Anterior circulation: Both internal carotid arteries are patent to the termini, without significant stenosis. A1 segments patent. Normal anterior communicating artery. Anterior cerebral arteries are patent to their distal aspects without significant stenosis. No M1 stenosis or occlusion. Distal MCA branches perfused to their distal aspects without significant stenosis. Posterior circulation: Vertebral arteries patent to the vertebrobasilar junction without stenosis.  Posterior inferior cerebral arteries patent bilaterally. Basilar patent to its distal aspect. Superior cerebellar arteries patent proximally. Patent P1 segments. PCAs perfused to their distal aspects without significant stenosis. The bilateral posterior communicating arteries are not visualized. Anatomic variants: None significant MRA NECK FINDINGS Aortic arch: Standard aortic branching. No evidence of dissection or aneurysm in the imaged aorta. Right carotid system: No evidence of dissection, occlusion, or hemodynamically significant stenosis (greater than 50%). Left carotid system: No evidence of dissection, occlusion, or hemodynamically significant stenosis (greater than 50%). Vertebral arteries: No evidence of dissection, occlusion, or hemodynamically significant stenosis (greater than 50%). Other: None IMPRESSION: 1. No acute intracranial process. No evidence of acute or subacute infarct. 2. Suspect diffuse dural thickening, with a trace circumferential subdural collection overlying both cerebral and cerebellar hemispheres felt to be less likely, although evaluation is limited by the absence of intravenous contrast; this was not present on the prior exam. Consider MRI with contrast for further evaluation. 3. No intracranial large vessel occlusion or significant stenosis. 4. No hemodynamically significant stenosis in the neck. Electronically Signed  By: Wiliam Ke M.D.   On: 11/08/2023 23:31   MR ANGIO NECK W WO CONTRAST  Result Date: 11/08/2023 CLINICAL DATA:  Right eye soreness, blurred vision EXAM: MRI HEAD WITHOUT CONTRAST MRA HEAD WITHOUT CONTRAST MRA NECK WITHOUT AND WITH CONTRAST TECHNIQUE: Multiplanar, multi-echo pulse sequences of the brain and surrounding structures were acquired without intravenous contrast. Angiographic images of the Circle of Willis were acquired using MRA technique without intravenous contrast. Angiographic images of the neck were acquired using MRA technique without and  with intravenous contrast. Carotid stenosis measurements (when applicable) are obtained utilizing NASCET criteria, using the distal internal carotid diameter as the denominator. CONTRAST:  5mL GADAVIST GADOBUTROL 1 MMOL/ML IV SOLN COMPARISON:  08/16/2022 MRI head, 06/05/2008 MRA head, no prior MRA neck FINDINGS: MRI HEAD FINDINGS Brain: No restricted diffusion to suggest acute or subacute infarct. Diffuse dural thickening versus a trace circumferential subdural collection overlying both cerebral and cerebellar hemispheres (series 12, image 13 and 30); evaluation is somewhat limited by the absence of intravenous contrast. This was not present on the prior exam. No acute hemorrhage, mass, mass effect, or midline shift. No hydrocephalus. Pituitary and craniocervical junction within normal limits. No hemosiderin deposition to suggest remote hemorrhage. Vascular: Normal arterial flow voids. Skull and upper cervical spine: Normal marrow signal. Sinuses/Orbits: Mucous retention cysts in the maxillary sinuses. Status post bilateral lens replacements. Other: The mastoid air cells are well aerated. MRA HEAD FINDINGS Anterior circulation: Both internal carotid arteries are patent to the termini, without significant stenosis. A1 segments patent. Normal anterior communicating artery. Anterior cerebral arteries are patent to their distal aspects without significant stenosis. No M1 stenosis or occlusion. Distal MCA branches perfused to their distal aspects without significant stenosis. Posterior circulation: Vertebral arteries patent to the vertebrobasilar junction without stenosis. Posterior inferior cerebral arteries patent bilaterally. Basilar patent to its distal aspect. Superior cerebellar arteries patent proximally. Patent P1 segments. PCAs perfused to their distal aspects without significant stenosis. The bilateral posterior communicating arteries are not visualized. Anatomic variants: None significant MRA NECK FINDINGS  Aortic arch: Standard aortic branching. No evidence of dissection or aneurysm in the imaged aorta. Right carotid system: No evidence of dissection, occlusion, or hemodynamically significant stenosis (greater than 50%). Left carotid system: No evidence of dissection, occlusion, or hemodynamically significant stenosis (greater than 50%). Vertebral arteries: No evidence of dissection, occlusion, or hemodynamically significant stenosis (greater than 50%). Other: None IMPRESSION: 1. No acute intracranial process. No evidence of acute or subacute infarct. 2. Suspect diffuse dural thickening, with a trace circumferential subdural collection overlying both cerebral and cerebellar hemispheres felt to be less likely, although evaluation is limited by the absence of intravenous contrast; this was not present on the prior exam. Consider MRI with contrast for further evaluation. 3. No intracranial large vessel occlusion or significant stenosis. 4. No hemodynamically significant stenosis in the neck. Electronically Signed   By: Wiliam Ke M.D.   On: 11/08/2023 23:31   CT VENOGRAM HEAD  Result Date: 11/08/2023 CLINICAL DATA:  Dural venous sinus thrombosis suspected sinus infection now vision changes HA, r/o CVT EXAM: CT VENOGRAM HEAD TECHNIQUE: Venographic phase images of the brain were obtained following the administration of intravenous contrast. Multiplanar reformats and maximum intensity projections were generated. RADIATION DOSE REDUCTION: This exam was performed according to the departmental dose-optimization program which includes automated exposure control, adjustment of the mA and/or kV according to patient size and/or use of iterative reconstruction technique. CONTRAST:  75mL OMNIPAQUE IOHEXOL 350 MG/ML SOLN COMPARISON:  CT head 01/16/2023. FINDINGS: Brain: No evidence of acute large vascular territory infarct, acute hemorrhage, mass lesion, midline shift or hydrocephalus. Dural venous sinuses: Patent. No  evidence of thrombus. Specifically, the superior sagittal, straight, sigmoid, and transverse sinuses are patent. No significant stenosis. Visualized deep cerebral veins are patent. Symmetric cavernous sinus opacification. Skull: No acute fracture. Sinuses/orbits: Mostly clear sinuses.  No acute orbital findings. IMPRESSION: No evidence of acute intracranial abnormality or dural venous sinus thrombosis. Electronically Signed   By: Feliberto Harts M.D.   On: 11/08/2023 14:58    Microbiology: Results for orders placed or performed during the hospital encounter of 08/15/22  SARS Coronavirus 2 by RT PCR (hospital order, performed in [facility] hospital lab) *cepheid single result test* Anterior Nasal Swab     Status: None   Collection Time: 08/16/22 12:20 AM   Specimen: Anterior Nasal Swab  Result Value Ref Range Status   SARS Coronavirus 2 by RT PCR NEGATIVE NEGATIVE Final    Comment: (NOTE) SARS-CoV-2 target nucleic acids are NOT DETECTED.  The SARS-CoV-2 RNA is generally detectable in upper and lower respiratory specimens during the acute phase of infection. The lowest concentration of SARS-CoV-2 viral copies this assay can detect is 250 copies / mL. A negative result does not preclude SARS-CoV-2 infection and should not be used as the sole basis for treatment or other patient management decisions.  A negative result may occur with improper specimen collection / handling, submission of specimen other than nasopharyngeal swab, presence of viral mutation(s) within the areas targeted by this assay, and inadequate number of viral copies (<250 copies / mL). A negative result must be combined with clinical observations, patient history, and epidemiological information.  Fact Sheet for Patients:   RoadLapTop.co.za  Fact Sheet for Healthcare Providers: http://kim-miller.com/  This test is not yet approved or  cleared by the [place] FDA and has  been authorized for detection and/or diagnosis of SARS-CoV-2 by FDA under an Emergency Use Authorization (EUA).  This EUA will remain in effect (meaning this test can be used) for the duration of the COVID-19 declaration under Section 564(b)(1) of the Act, 21 U.S.C. section 360bbb-3(b)(1), unless the authorization is terminated or revoked sooner.  Performed at [facility], [address] [place], [place] [address]     Labs: CBC: Recent Labs  Lab 11/08/23 1336  WBC 5.8  NEUTROABS 3.9  HGB 14.7  HCT 42.7  MCV 89.0  PLT 236   Basic Metabolic Panel: Recent Labs  Lab 11/08/23 1336 11/09/23 0354  NA 139 138  K 3.5 3.8  CL 103 104  CO2 27 28  GLUCOSE 125* 110*  BUN 15 13  CREATININE 0.76 0.73  CALCIUM 9.1 8.7*   Liver Function Tests: Recent Labs  Lab 11/08/23 1336  AST 24  ALT 18  ALKPHOS 64  BILITOT 0.7  PROT 7.0  ALBUMIN 4.3   CBG: No results for input(s): "GLUCAP" in the last 168 hours.  Discharge time spent: greater than 30 minutes.  Signed: Lurene Shadow, MD [facility] 11/09/2023

## 2023-11-11 ENCOUNTER — Telehealth: Payer: Self-pay | Admitting: Family Medicine

## 2023-11-11 NOTE — Telephone Encounter (Signed)
Patient called to see if someone could call her with her cholesterol. Please f/u with patient

## 2023-11-12 NOTE — Telephone Encounter (Signed)
Please notify patient that her LDL (bad cholesterol)  cholesterol was above goal of less than 70 at 103, other cholesterol components were all within normal ranges.   Total cholesterol was 175, HDL was 59 and triglycerides were 67   Recommend that she continue the atorvastatin 40mg  daily

## 2023-11-12 NOTE — Telephone Encounter (Signed)
Lab results and medication recommendation shared with patient. She verbalized understanding.

## 2023-11-15 ENCOUNTER — Emergency Department
Admission: EM | Admit: 2023-11-15 | Discharge: 2023-11-15 | Disposition: A | Discharge status: Home / Self Care | Payer: HMO | Attending: Emergency Medicine | Admitting: Emergency Medicine

## 2023-11-15 ENCOUNTER — Other Ambulatory Visit: Payer: Self-pay

## 2023-11-15 ENCOUNTER — Emergency Department: Payer: HMO

## 2023-11-15 DIAGNOSIS — S299XXA Unspecified injury of thorax, initial encounter: Secondary | ICD-10-CM | POA: Diagnosis not present

## 2023-11-15 DIAGNOSIS — R0789 Other chest pain: Secondary | ICD-10-CM | POA: Insufficient documentation

## 2023-11-15 DIAGNOSIS — I700 Atherosclerosis of aorta: Secondary | ICD-10-CM | POA: Diagnosis not present

## 2023-11-15 LAB — CBC
HCT: 42.1 % (ref 36.0–46.0)
Hemoglobin: 14.1 g/dL (ref 12.0–15.0)
MCH: 30.1 pg (ref 26.0–34.0)
MCHC: 33.5 g/dL (ref 30.0–36.0)
MCV: 89.8 fL (ref 80.0–100.0)
Platelets: 228 10*3/uL (ref 150–400)
RBC: 4.69 MIL/uL (ref 3.87–5.11)
RDW: 12.2 % (ref 11.5–15.5)
WBC: 5.1 10*3/uL (ref 4.0–10.5)
nRBC: 0 % (ref 0.0–0.2)

## 2023-11-15 LAB — BASIC METABOLIC PANEL
Anion gap: 9 (ref 5–15)
BUN: 14 mg/dL (ref 8–23)
CO2: 27 mmol/L (ref 22–32)
Calcium: 9.2 mg/dL (ref 8.9–10.3)
Chloride: 104 mmol/L (ref 98–111)
Creatinine, Ser: 0.79 mg/dL (ref 0.44–1.00)
GFR, Estimated: >60 mL/min (ref >60)
Glucose, Bld: 94 mg/dL (ref 70–99)
Potassium: 3.7 mmol/L (ref 3.5–5.1)
Sodium: 140 mmol/L (ref 135–145)

## 2023-11-15 LAB — TROPONIN I (HIGH SENSITIVITY): Troponin I (High Sensitivity): 4 ng/L (ref <18)

## 2023-11-15 NOTE — ED Provider Notes (Signed)
[facility] Provider Note    Event Date/Time   First MD Initiated Contact with Patient 11/15/23 1144     (approximate)   History   Chest Pain   HPI  Alexis Lyons is a 82 y.o. female with a history of GERD, paroxysmal atrial fibrillation who presents with complaints of left-sided chest discomfort.  She attributes this to taking her flowerpots and to prevent frost.  She reports symptoms been ongoing for the last several days.  She reports her children became concerned and wanted her to come to the emergency department which she thinks is a little bit silly.  She denies shortness of breath, no pleurisy, no rash     Physical Exam   Triage Vital Signs: ED Triage Vitals  Encounter Vitals Group     BP 11/15/23 1121 (!) 167/80     Systolic BP Percentile --      Diastolic BP Percentile --      Pulse Rate 11/15/23 1121 62     Resp 11/15/23 1121 20     Temp 11/15/23 1121 97.7 F (36.5 C)     Temp Source 11/15/23 1121 Oral     SpO2 11/15/23 1121 98 %     Weight 11/15/23 1123 54.4 kg (120 lb)     Height 11/15/23 1123 1.6 m (5\' 3" )     Head Circumference --      Peak Flow --      Pain Score 11/15/23 1122 2     Pain Loc --      Pain Education --      Exclude from Growth Chart --     Most recent vital signs: Vitals:   11/15/23 1121 11/15/23 1316  BP: (!) 167/80 (!) 160/79  Pulse: 62 60  Resp: 20 15  Temp: 97.7 F (36.5 C) 97.7 F (36.5 C)  SpO2: 98% 100%     General: Awake, no distress.  CV:  Good peripheral perfusion.  No chest wall rash, normal range of motion of the left upper arm Resp:  Normal effort.  Abd:  No distention.  Soft, nontender Other:     ED Results / Procedures / Treatments   Labs (all labs ordered are listed, but only abnormal results are displayed) Labs Reviewed  BASIC METABOLIC PANEL  CBC  TROPONIN I (HIGH SENSITIVITY)     EKG  ED ECG REPORT I, Jene Every, the attending physician, personally viewed and  interpreted this ECG.  Date: 11/15/2023  Rhythm: normal sinus rhythm QRS Axis: normal Intervals: normal ST/T Wave abnormalities: normal Narrative Interpretation: no evidence of acute ischemia    RADIOLOGY Chest x-ray viewed interpret by me, no acute abnormality   PROCEDURES:  Critical Care performed:   Procedures   MEDICATIONS ORDERED IN ED: Medications - No data to display   IMPRESSION / MDM / ASSESSMENT AND PLAN / ED COURSE  I reviewed the triage vital signs and the nursing notes. Patient's presentation is most consistent with acute presentation with potential threat to life or bodily function.  Patient presents with chest pain as detailed above.  Differential includes ACS, angina, chest wall pain, pneumonia, muscle strain  Overall well-appearing and in no acute distress, high sensitive troponin, EKG are quite reassuring.  Review of record demonstrates the patient had CT coronary calcium scoring on March 14 24th with a calcium score of 9.38  Chest x-ray without evidence of pneumonia, pneumothorax edema  Not consistent with PE, discussed with patient at length and  considered admission however given reassuring workup feel patient is appropriate for discharge with cardiology follow-up, she agrees with this plan.  Strict return precautions        FINAL CLINICAL IMPRESSION(S) / ED DIAGNOSES   Final diagnoses:  Atypical chest pain     Rx / DC Orders   ED Discharge Orders     None        Note:  This document was prepared using Dragon voice recognition software and may include unintentional dictation errors.   Jene Every, MD 11/15/23 [phone]

## 2023-11-15 NOTE — ED Triage Notes (Signed)
Pt c/o L chest soreness that started after lifting heavy plants. Pt states the pain is worse with certain movements such as lifting the heavy [place] yesterday. Pt Denies associated ShOB, N/V, or diaphoresis. Pt with recent hospitalization for TIA.

## 2023-11-15 NOTE — ED Notes (Signed)
Blue top with "save tube label" sent to the labe. Pt confirms on blood thinners.

## 2023-11-21 ENCOUNTER — Encounter: Payer: Self-pay | Admitting: Cardiology

## 2023-11-21 ENCOUNTER — Ambulatory Visit: Payer: HMO | Attending: Cardiology | Admitting: Cardiology

## 2023-11-21 ENCOUNTER — Encounter: Payer: Self-pay | Admitting: Physician Assistant

## 2023-11-21 ENCOUNTER — Ambulatory Visit (INDEPENDENT_AMBULATORY_CARE_PROVIDER_SITE_OTHER): Payer: HMO | Admitting: Physician Assistant

## 2023-11-21 VITALS — BP 131/83 | HR 68 | Ht 63.0 in | Wt 120.0 lb

## 2023-11-21 VITALS — BP 142/84 | HR 62 | Ht 63.0 in | Wt 121.2 lb

## 2023-11-21 DIAGNOSIS — J329 Chronic sinusitis, unspecified: Secondary | ICD-10-CM

## 2023-11-21 DIAGNOSIS — R079 Chest pain, unspecified: Secondary | ICD-10-CM

## 2023-11-21 DIAGNOSIS — I480 Paroxysmal atrial fibrillation: Secondary | ICD-10-CM

## 2023-11-21 DIAGNOSIS — I10 Essential (primary) hypertension: Secondary | ICD-10-CM | POA: Diagnosis not present

## 2023-11-21 DIAGNOSIS — I2510 Atherosclerotic heart disease of native coronary artery without angina pectoris: Secondary | ICD-10-CM | POA: Diagnosis not present

## 2023-11-21 DIAGNOSIS — R0789 Other chest pain: Secondary | ICD-10-CM | POA: Diagnosis not present

## 2023-11-21 DIAGNOSIS — R5383 Other fatigue: Secondary | ICD-10-CM

## 2023-11-21 NOTE — Progress Notes (Signed)
Cardiology Office Note:    Date:  11/21/2023   ID:  Alexis Lyons, DOB Mar 28, 1941, MRN [id number]  PCP:  Alexis Downer, MD   [facility]  Cardiologist:  Debbe Odea, MD  Advanced Practice Provider:  No care team member to display Electrophysiologist:  Alexis Prude, MD       Referring MD: Alexis Downer, MD   Chief Complaint  Patient presents with   acute concerns    Patient reports heart feels sore/achy that radiates to left shoulder that was evaluated at ED on 11/15/23.  Also had ED visit on 11/08/23 with diagnosis of TIA.  While driving to visit today she started having tingling/numbness in left hand with slight tingling on left side of face.      History of Present Illness:    Alexis Lyons is a 82 y.o. female with a hx of paroxysmal atrial fibrillation s/p RFA 10/2021, hypertension, who presents due to left-sided chest pain.  She states standing some close couple of months ago and noticed the left side and muscle soreness after.  Around.  Of Thanksgiving while taking [place] from her refrigerator, she also noticed left-sided chest soreness radiating to her left shoulder.  Has felt some fatigue during this time.  Was seen in the ED, workup with EKG and troponins were unrevealing.   Prior notes Echocardiogram 04/2021 EF 60 to 65%, impaired relaxation. Cardiac monitor 08/2022 paroxysmal SVT.  No A-fib.   Past Medical History:  Diagnosis Date   Amnesia    Aphthae    Atrial fibrillation (HCC)    Congestion-fibrosis syndrome    COVID 12/12/2022   DD (diverticular disease)    GERD (gastroesophageal reflux disease)    Seizures (HCC) 12/2017   passed out due to dehydration   TIA (transient ischemic attack) 10/2023    Past Surgical History:  Procedure Laterality Date   ATRIAL FIBRILLATION ABLATION N/A 11/02/2021   Procedure: ATRIAL FIBRILLATION ABLATION;  Surgeon: Alexis Prude, MD;  Location: [facility] INVASIVE CV LAB;   Service: Cardiovascular;  Laterality: N/A;   CHOLECYSTECTOMY     colonoscopy with polypectomy     8/11 4 mm polyp, 12 mm polyp   COLONOSCOPY WITH PROPOFOL N/A 02/24/2018   Procedure: COLONOSCOPY WITH PROPOFOL;  Surgeon: Scot Jun, MD;  Location: [facility] ENDOSCOPY;  Service: Endoscopy;  Laterality: N/A;   HERNIA REPAIR Right 2004   inguinal    HERNIA REPAIR     x's 3    Current Medications: Current Meds  Medication Sig   apixaban (ELIQUIS) 2.5 MG TABS tablet Take 1 tablet (2.5 mg total) by mouth 2 (two) times daily.   atorvastatin (LIPITOR) 40 MG tablet Take 1 tablet (40 mg total) by mouth daily.   conjugated estrogens (PREMARIN) vaginal cream Place 1 Applicatorful vaginally 2 (two) times a week.   Cyanocobalamin (B-12 PO) Take by mouth daily.   hydrochlorothiazide (HYDRODIURIL) 12.5 MG tablet TAKE 1 TABLET BY MOUTH EVERY DAY   metoprolol succinate (TOPROL XL) 25 MG 24 hr tablet Take 0.5 tablets (12.5 mg total) by mouth daily.   zolpidem (AMBIEN) 5 MG tablet TAKE 1/2 TABLET (2.5 MG TOTAL) BY MOUTH AT BEDTIME AS NEEDED FOR SLEEP     Allergies:   Codeine and Amlodipine   Social History   Socioeconomic History   Marital status: Divorced    Spouse name: Not on file   Number of children: 2   Years of education: 15   Highest  education level: Not on file  Occupational History   Occupation: [name]  Tobacco Use   Smoking status: Former    Current packs/day: 0.00    Average packs/day: 0.5 packs/day for 15.0 years (7.5 ttl pk-yrs)    Types: Cigarettes    Start date: 12/16/1970    Quit date: 12/16/1985    Years since quitting: 37.9   Smokeless tobacco: Never  Vaping Use   Vaping status: Never Used  Substance and Sexual Activity   Alcohol use: Yes    Alcohol/week: 7.0 standard drinks of alcohol    Types: 7 Glasses of wine per week    Comment: glass of wine with dinner   Drug use: No   Sexual activity: Not Currently  Other Topics Concern   Not on file  Social  History Narrative   Not on file   Social Determinants of Health   Financial Resource Strain: Not on file  Food Insecurity: No Food Insecurity (11/08/2023)   Hunger Vital Sign    Worried About Running Out of Food in the Last Year: Never true    Ran Out of Food in the Last Year: Never true  Transportation Needs: No Transportation Needs (11/08/2023)   PRAPARE - Administrator, Civil Service (Medical): No    Lack of Transportation (Non-Medical): No  Physical Activity: Not on file  Stress: Not on file  Social Connections: Not on file     Family History: The patient's family history includes Alcohol abuse in her father; Alzheimer's disease in her mother; Breast cancer in her sister; Cancer in her father and sister.  ROS:   Please see the history of present illness.     All other systems reviewed and are negative.  EKGs/Labs/Other Studies Reviewed:    The following studies were reviewed today:   EKG:  EKG is ordered today.  EKG shows sinus bradycardia , heart rate 59  Recent Labs: 11/01/2023: TSH 1.260 11/08/2023: ALT 18 11/15/2023: BUN 14; Creatinine, Ser 0.79; Hemoglobin 14.1; Platelets 228; Potassium 3.7; Sodium 140  Recent Lipid Panel    Component Value Date/Time   CHOL 175 11/09/2023 0354   CHOL 171 08/15/2022 1328   TRIG 67 11/09/2023 0354   HDL 59 11/09/2023 0354   HDL 66 08/15/2022 1328   CHOLHDL 3.0 11/09/2023 0354   VLDL 13 11/09/2023 0354   LDLCALC 103 (H) 11/09/2023 0354   LDLCALC 85 08/15/2022 1328     Risk Assessment/Calculations:      Physical Exam:    VS:  BP (!) 142/84 (BP Location: Left Arm, Patient Position: Sitting, Cuff Size: Normal)   Pulse 62   Ht 5\' 3"  (1.6 m)   Wt 121 lb 3.2 oz (55 kg)   SpO2 100%   BMI 21.47 kg/m     Wt Readings from Last 3 Encounters:  11/21/23 121 lb 3.2 oz (55 kg)  11/15/23 120 lb (54.4 kg)  11/08/23 122 lb 12.7 oz (55.7 kg)     GEN:  Well nourished, well developed in no acute distress HEENT:  Normal NECK: No JVD; No carotid bruits CARDIAC: RRR, no murmurs, rubs, gallops RESPIRATORY:  Clear to auscultation without rales, wheezing or rhonchi  ABDOMEN: Soft, non-tender, non-distended MUSCULOSKELETAL:  No edema; No deformity  SKIN: Warm and dry NEUROLOGIC:  Alert and oriented x 3 PSYCHIATRIC:  Normal affect   ASSESSMENT:    1. Chest pain, unspecified type   2. Paroxysmal atrial fibrillation (HCC)   3. Primary hypertension  4. Coronary artery calcification    PLAN:    In order of problems listed above:  Chest pain, likely musculoskeletal in origin from lifting objects.  Recent coronary CTA showed minimal calcification with no significant obstruction.  Echocardiogram 10 days ago showed normal systolic function EF 60 to 65%.  Follow-up with PCP regarding treatment of musculoskeletal pain and fatigue. paroxysmal atrial fibrillation, s/p ablation 10/2021.  CHA2DS2-VASc of 4 (age, htn, gender).  Continue Eliquis 2.5 mg twice daily.  Toprol-XL  Hypertension, BP elevated, but reasonable for age.  Continue Toprol-XL 12.5 mg daily, HCTZ 12.5 mg daily.  Minimal proximal LAD calcification less than 25%, calcium score 9.38 on CCTA 3/24.  Continue Eliquis, Lipitor 40 mg daily.  Follow-up in 1 year.   Medication Adjustments/Labs and Tests Ordered: Current medicines are reviewed at length with the patient today.  Concerns regarding medicines are outlined above.  Orders Placed This Encounter  Procedures   EKG 12-Lead    No orders of the defined types were placed in this encounter.    Patient Instructions  Medication Instructions:  Your physician recommends that you continue on your current medications as directed. Please refer to the Current Medication list given to you today.  *If you need a refill on your cardiac medications before your next appointment, please call your pharmacy*   Lab Work:  NONE  If you have labs (blood work) drawn today and your tests are completely  normal, you will receive your results only by: MyChart Message (if you have MyChart) OR A paper copy in the mail If you have any lab test that is abnormal or we need to change your treatment, we will call you to review the results.   Testing/Procedures:  NONE   Follow-Up: At [facility], you and your health needs are our priority.  As part of our continuing mission to provide you with exceptional heart care, we have created designated Provider Care Teams.  These Care Teams include your primary Cardiologist (physician) and Advanced Practice Providers (APPs -  Physician Assistants and Nurse Practitioners) who all work together to provide you with the care you need, when you need it.  We recommend signing up for the patient portal called "MyChart".  Sign up information is provided on this After Visit Summary.  MyChart is used to connect with patients for Virtual Visits (Telemedicine).  Patients are able to view lab/test results, encounter notes, upcoming appointments, etc.  Non-urgent messages can be sent to your provider as well.   To learn more about what you can do with MyChart, go to [email or web address].    Your next appointment:   12 month(s)  Provider:   You may see Debbe Odea, MD  or one of the following Advanced Practice Providers on your designated Care Team:   Nicolasa Ducking, NP Eula Listen, PA-C Cadence Fransico Michael, PA-C Charlsie Quest, NP Carlos Levering, NP   Other Instructions  Musculoskeletal Pain    Signed, Debbe Odea, MD  11/21/2023 12:20 PM    [facility]

## 2023-11-21 NOTE — Patient Instructions (Addendum)
Medication Instructions:  Your physician recommends that you continue on your current medications as directed. Please refer to the Current Medication list given to you today.  *If you need a refill on your cardiac medications before your next appointment, please call your pharmacy*   Lab Work:  NONE  If you have labs (blood work) drawn today and your tests are completely normal, you will receive your results only by: MyChart Message (if you have MyChart) OR A paper copy in the mail If you have any lab test that is abnormal or we need to change your treatment, we will call you to review the results.   Testing/Procedures:  NONE   Follow-Up: At [facility], you and your health needs are our priority.  As part of our continuing mission to provide you with exceptional heart care, we have created designated Provider Care Teams.  These Care Teams include your primary Cardiologist (physician) and Advanced Practice Providers (APPs -  Physician Assistants and Nurse Practitioners) who all work together to provide you with the care you need, when you need it.  We recommend signing up for the patient portal called "MyChart".  Sign up information is provided on this After Visit Summary.  MyChart is used to connect with patients for Virtual Visits (Telemedicine).  Patients are able to view lab/test results, encounter notes, upcoming appointments, etc.  Non-urgent messages can be sent to your provider as well.   To learn more about what you can do with MyChart, go to [email or web address].    Your next appointment:   12 month(s)  Provider:   You may see Debbe Odea, MD  or one of the following Advanced Practice Providers on your designated Care Team:   Nicolasa Ducking, NP Eula Listen, PA-C Cadence Fransico Michael, PA-C Charlsie Quest, NP Carlos Levering, NP   Other Instructions  Musculoskeletal Pain

## 2023-11-21 NOTE — Progress Notes (Signed)
Established patient visit  Patient: Alexis Lyons   DOB: 12/07/1941   82 y.o. Female  MRN: [id number] Visit Date: 11/21/2023  Today's healthcare provider: Debera Lat, PA-C   Chief Complaint  Patient presents with   Follow-up    Pt stated--went to ER left chest pain--still having sharp pain, tingling on the hand since  before Thanksgiving. Referral ENT   Subjective     Discussed the use of AI scribe software for clinical note transcription with the patient, who gave verbal consent to proceed.  History of Present Illness   The patient, with a history of TIA and high cholesterol, presents with multiple complaints. The chief complaint is chest pain, which the patient describes as a soreness in the chest area that sometimes radiates to the shoulder. The pain is not constant and seems to move around. The patient also reports a tingling sensation in the hand. The patient believes the pain might be due to a heavy lifting incident involving a [place] and some plants.  In addition to the chest pain, the patient reports feeling fatigued and tired all the time, a condition that has persisted since their return from a trip to [place]. The patient used to be active, walking three miles a day, but has been unable to continue this routine due to the fatigue.  The patient also complains of sinus issues, which have been ongoing for the past two months. Despite using saline and Flonase, the patient has not seen any improvement. The patient has seen multiple doctors for this issue but has not received a definitive diagnosis or effective treatment.           11/21/2023    1:40 PM 09/17/2023    2:17 PM 07/11/2023    2:18 PM  Depression screen PHQ 2/9  Decreased Interest 0 0 0  Down, Depressed, Hopeless 0 0 0  PHQ - 2 Score 0 0 0  Altered sleeping 0  0  Tired, decreased energy 0  0  Change in appetite 0  0  Feeling bad or failure about yourself  0  0  Trouble concentrating 0  0  Moving slowly  or fidgety/restless 0  0  Suicidal thoughts 0  0  PHQ-9 Score 0  0  Difficult doing work/chores Not difficult at all  Not difficult at all      11/21/2023    1:41 PM 09/17/2023    2:18 PM  GAD 7 : Generalized Anxiety Score  Nervous, Anxious, on Edge 0 0  Control/stop worrying 0 0  Worry too much - different things 0 0  Trouble relaxing 2 0  Restless 2 0  Easily annoyed or irritable 0 0  Afraid - awful might happen 0 0  Total GAD 7 Score 4 0  Anxiety Difficulty Not difficult at all Not difficult at all    Medications: Outpatient Medications Prior to Visit  Medication Sig   apixaban (ELIQUIS) 2.5 MG TABS tablet Take 1 tablet (2.5 mg total) by mouth 2 (two) times daily.   atorvastatin (LIPITOR) 40 MG tablet Take 1 tablet (40 mg total) by mouth daily.   conjugated estrogens (PREMARIN) vaginal cream Place 1 Applicatorful vaginally 2 (two) times a week.   Cyanocobalamin (B-12 PO) Take by mouth daily.   hydrochlorothiazide (HYDRODIURIL) 12.5 MG tablet TAKE 1 TABLET BY MOUTH EVERY DAY   metoprolol succinate (TOPROL XL) 25 MG 24 hr tablet Take 0.5 tablets (12.5 mg total) by mouth daily.   zolpidem (AMBIEN) 5 MG  tablet TAKE 1/2 TABLET (2.5 MG TOTAL) BY MOUTH AT BEDTIME AS NEEDED FOR SLEEP   No facility-administered medications prior to visit.    Review of Systems  All other systems reviewed and are negative.  Except see HPI       Objective    BP 131/83 (BP Location: Right Arm, Patient Position: Sitting, Cuff Size: Normal)   Pulse 68   Ht 5\' 3"  (1.6 m)   Wt 120 lb (54.4 kg)   SpO2 97%   BMI 21.26 kg/m     Physical Exam Vitals reviewed.  Constitutional:      General: She is not in acute distress.    Appearance: Normal appearance. She is well-developed. She is not diaphoretic.  HENT:     Head: Normocephalic and atraumatic.  Eyes:     General: No scleral icterus.    Conjunctiva/sclera: Conjunctivae normal.  Neck:     Thyroid: No thyromegaly.  Cardiovascular:      Rate and Rhythm: Normal rate and regular rhythm.     Pulses: Normal pulses.     Heart sounds: Normal heart sounds. No murmur heard. Pulmonary:     Effort: Pulmonary effort is normal. No respiratory distress.     Breath sounds: Normal breath sounds. No wheezing, rhonchi or rales.  Musculoskeletal:     Cervical back: Neck supple.     Right lower leg: No edema.     Left lower leg: No edema.  Lymphadenopathy:     Cervical: No cervical adenopathy.  Skin:    General: Skin is warm and dry.     Findings: No rash.  Neurological:     Mental Status: She is alert and oriented to person, place, and time. Mental status is at baseline.  Psychiatric:        Mood and Affect: Mood normal.        Behavior: Behavior normal.      No results found for any visits on 11/21/23.  Assessment & Plan       Recurrent sinusitis Ongoing sinus issues despite use of saline and Flonase and recent course of antibiotic. -Consider adding an antihistamines.  Patient was provided with a sample -Referral to ENT specialist for evaluation  Other fatigue Could be due to sinusitis, chest pain, possible episode of transient ischemic attack/see ED visit on 11/15/23 and on 11/08/23 All labs were reassuring/troponin, cbc, bmp, lp, A1c, sed rate, tsh Normal vitals and neuro exam CT and MR brain showed no evidence of acute intracranial abnormality or dural venous sinus thrombosis Possible Transient Ischemic Attack (TIA) Recent episode of transient vision loss and speech difficulty. No acute findings on recent MRI. -Continue current management. Reports of feeling tired and lacking energy. Sleep pattern is normal. -Consider checking vitamin levels (B12 and D). -Encourage adequate hydration and balanced diet. Patient reluctant to do more blood work during this visit Consider referral to neurology? Continue eliquis 2.5mg  bid  Other chest pain Most likely musculoskeletal  Advised by Dr.Agbor-Etang on 11/21/23 Pain in the  chest and shoulder region, possibly due to overexertion or incorrect movement. Pain is intermittent and associated with tingling sensation. No cardiac cause identified in recent ER visit and during her recent visit with his cardiologist -Rest and avoid heavy lifting. -Apply ice for 10-15 minutes as needed. -Use Voltaren gel topically, tylenol -Consider physical therapy if no improvement in 1-2 weeks. The patient was advised to call back or seek an in-person evaluation if the symptoms worsen or if the condition fails to  improve as anticipated.  Hyperlipidemia Chronic Elevated bad cholesterol identified during recent hospitalization. -Continue Lipitor.  Will follow-up  Hypertension Chronic Bp was 131/84 Continue toprol 25mg  , hydrochlorothiazide 12.5 mg Will follow-up  Bone Health Due for bone density scan (dexascan). -Schedule dexascan. -Encourage intake of calcium and vitamin D through diet or supplements.  No follow-ups on file.     I discussed the assessment and treatment plan with the patient. The patient was provided an opportunity to ask questions and all were answered. The patient agreed with the plan and demonstrated an understanding of the instructions.  I, Debera Lat, PA-C have reviewed all documentation for this visit. The documentation on  11/21/23  for the exam, diagnosis, procedures, and orders are all accurate and complete.  Debera Lat, [facility], MMS [facility] [phone] (phone) [phone] (fax)  [facility]

## 2023-11-27 DIAGNOSIS — G4453 Primary thunderclap headache: Secondary | ICD-10-CM | POA: Diagnosis not present

## 2023-12-06 ENCOUNTER — Other Ambulatory Visit: Payer: Self-pay | Admitting: Family Medicine

## 2023-12-06 MED ORDER — ATORVASTATIN CALCIUM 40 MG PO TABS: 40.0000 mg | ORAL_TABLET | Freq: Every day | ORAL | 0 refills | Status: DC

## 2023-12-06 NOTE — Telephone Encounter (Addendum)
Medication Refill -  Most Recent Primary Care Visit:  Provider: Debera Lat  Department: [facility]  Visit Type: OFFICE VISIT  Date: 11/21/2023  Medication:  atorvastatin (LIPITOR) 40 MG tablet  Has the patient contacted their pharmacy? Yes (Agent: If no, request that the patient contact the pharmacy for the refill. If patient does not wish to contact the pharmacy document the reason why and proceed with request.) (Agent: If yes, when and what did the pharmacy advise?)  Is this the correct pharmacy for this prescription? Yes If no, delete pharmacy and type the correct one.  This is the patient's preferred pharmacy:  [id number] - [address] [place] [address] Phone: [phone] Fax: [phone]  Has the prescription been filled recently? Yes  Is the patient out of the medication? Yes  Has the patient been seen for an appointment in the last year OR does the patient have an upcoming appointment? Yes  Can we respond through MyChart? Yes  Pt has one pill left and going out of town for the holidays.  Can you refill for 30 days ntil she can get to appt?

## 2023-12-06 NOTE — Telephone Encounter (Signed)
Requested medication (s) are due for refill today: Yes  Requested medication (s) are on the active medication list: Yes  Last refill:  11/09/23  Future visit scheduled: Yes  Notes to clinic:  Unable to refill per protocol, last refill by another provider.      Requested Prescriptions  Pending Prescriptions Disp Refills   atorvastatin (LIPITOR) 40 MG tablet 30 tablet 0    Sig: Take 1 tablet (40 mg total) by mouth daily.     Cardiovascular:  Antilipid - Statins Failed - 12/06/2023 11:40 AM      Failed - Lipid Panel in normal range within the last 12 months    Cholesterol, Total  Date Value Ref Range Status  08/15/2022 171 100 - 199 mg/dL Final   Cholesterol  Date Value Ref Range Status  11/09/2023 175 0 - 200 mg/dL Final   LDL Chol Calc (NIH)  Date Value Ref Range Status  08/15/2022 85 0 - 99 mg/dL Final   LDL Cholesterol  Date Value Ref Range Status  11/09/2023 103 (H) 0 - 99 mg/dL Final    Comment:           Total Cholesterol/HDL:CHD Risk Coronary Heart Disease Risk Table                     Men   Women  1/2 Average Risk   3.4   3.3  Average Risk       5.0   4.4  2 X Average Risk   9.6   7.1  3 X Average Risk  23.4   11.0        Use the calculated Patient Ratio above and the CHD Risk Table to determine the patient's CHD Risk.        ATP III CLASSIFICATION (LDL):  <100     mg/dL   Optimal  528-413  mg/dL   Near or Above                    Optimal  130-159  mg/dL   Borderline  244-010  mg/dL   High  >272     mg/dL   Very High Performed at [facility], [address] [place], [place] [address]    HDL  Date Value Ref Range Status  11/09/2023 59 >40 mg/dL Final  40/34/7425 66 >95 mg/dL Final   Triglycerides  Date Value Ref Range Status  11/09/2023 67 <150 mg/dL Final         Passed - Patient is not pregnant      Passed - Valid encounter within last 12 months    Recent Outpatient Visits           2 weeks ago Recurrent sinusitis    [place] [facility] [place], [place], PA-C   1 month ago Acute recurrent frontal sinusitis   [facility] Malva Limes, MD   1 month ago Other subacute sinusitis   [place] [facility] [place], [place], PA-C   1 month ago Non-seasonal allergic rhinitis due to other allergic trigger   [place] [facility] [place], [place], PA-C   2 months ago Plantar fasciitis   [facility] [place], Marzella Schlein, MD       Future Appointments             In 2 weeks Bacigalupo, Marzella Schlein, MD [facility], [facility]

## 2023-12-19 ENCOUNTER — Ambulatory Visit: Payer: HMO | Admitting: Family Medicine

## 2023-12-20 ENCOUNTER — Other Ambulatory Visit: Payer: Self-pay | Admitting: Physician Assistant

## 2023-12-23 DIAGNOSIS — H0288A Meibomian gland dysfunction right eye, upper and lower eyelids: Secondary | ICD-10-CM | POA: Diagnosis not present

## 2023-12-23 DIAGNOSIS — H0288B Meibomian gland dysfunction left eye, upper and lower eyelids: Secondary | ICD-10-CM | POA: Diagnosis not present

## 2023-12-23 DIAGNOSIS — H35363 Drusen (degenerative) of macula, bilateral: Secondary | ICD-10-CM | POA: Diagnosis not present

## 2023-12-23 DIAGNOSIS — H01114 Allergic dermatitis of left upper eyelid: Secondary | ICD-10-CM | POA: Diagnosis not present

## 2023-12-23 DIAGNOSIS — H16143 Punctate keratitis, bilateral: Secondary | ICD-10-CM | POA: Diagnosis not present

## 2023-12-23 DIAGNOSIS — H04123 Dry eye syndrome of bilateral lacrimal glands: Secondary | ICD-10-CM | POA: Diagnosis not present

## 2023-12-23 DIAGNOSIS — H01111 Allergic dermatitis of right upper eyelid: Secondary | ICD-10-CM | POA: Diagnosis not present

## 2023-12-23 NOTE — Telephone Encounter (Signed)
 Requested medication (s) are due for refill today - yes  Requested medication (s) are on the active medication list -yes  Future visit scheduled -yes  Last refill: 09/12/23 #15  Notes to clinic: non delegated Rx  Requested Prescriptions  Pending Prescriptions Disp Refills   zolpidem  (AMBIEN ) 5 MG tablet [Pharmacy Med Name: ZOLPIDEM  TARTRATE 5 MG TABLET] 15 tablet 0    Sig: TAKE 1/2 TABLET (2.5 MG TOTAL) BY MOUTH AT BEDTIME AS NEEDED FOR SLEEP     Not Delegated - Psychiatry:  Anxiolytics/Hypnotics Failed - 12/23/2023 12:40 PM      Failed - This refill cannot be delegated      Failed - Urine Drug Screen completed in last 360 days      Passed - Valid encounter within last 6 months    Recent Outpatient Visits           1 month ago Recurrent sinusitis   [place] [facility] [place], [place], PA-C   1 month ago Acute recurrent frontal sinusitis   [facility] [place] Nancyann BRAVO, MD   2 months ago Other subacute sinusitis   [place] [facility] [place], [place], PA-C   2 months ago Non-seasonal allergic rhinitis due to other allergic trigger   [place] [facility] [place], [place], PA-C   3 months ago Plantar fasciitis   [place] [facility] [place], Jon HERO, MD       Future Appointments             Tomorrow Bacigalupo, Jon HERO, MD [place] [facility]               Requested Prescriptions  Pending Prescriptions Disp Refills   zolpidem  (AMBIEN ) 5 MG tablet [Pharmacy Med Name: ZOLPIDEM  TARTRATE 5 MG TABLET] 15 tablet 0    Sig: TAKE 1/2 TABLET (2.5 MG TOTAL) BY MOUTH AT BEDTIME AS NEEDED FOR SLEEP     Not Delegated - Psychiatry:  Anxiolytics/Hypnotics Failed - 12/23/2023 12:40 PM      Failed - This refill cannot be delegated      Failed - Urine Drug Screen completed in last 360 days      Passed - Valid encounter within last 6 months    Recent  Outpatient Visits           1 month ago Recurrent sinusitis   [place] [facility] [place], [place], PA-C   1 month ago Acute recurrent frontal sinusitis   [place] [facility] Gas[place] Nancyann BRAVO, MD   2 months ago Other subacute sinusitis   [place] [facility] [place], [place], PA-C   2 months ago Non-seasonal allergic rhinitis due to other allergic trigger   [place] [facility] [place], [place], PA-C   3 months ago Plantar fasciitis   [place] [facility] Bacigalupo, Jon HERO, MD       Future Appointments             Tomorrow Bacigalupo, Jon HERO, MD [facility] [place], [facility]

## 2023-12-24 ENCOUNTER — Ambulatory Visit (INDEPENDENT_AMBULATORY_CARE_PROVIDER_SITE_OTHER): Payer: HMO | Admitting: Family Medicine

## 2023-12-24 ENCOUNTER — Encounter: Payer: Self-pay | Admitting: Family Medicine

## 2023-12-24 VITALS — BP 133/87 | HR 68 | Ht 63.0 in | Wt 125.4 lb

## 2023-12-24 DIAGNOSIS — H6992 Unspecified Eustachian tube disorder, left ear: Secondary | ICD-10-CM

## 2023-12-24 DIAGNOSIS — Z8673 Personal history of transient ischemic attack (TIA), and cerebral infarction without residual deficits: Secondary | ICD-10-CM

## 2023-12-24 DIAGNOSIS — R0789 Other chest pain: Secondary | ICD-10-CM | POA: Diagnosis not present

## 2023-12-24 DIAGNOSIS — E7800 Pure hypercholesterolemia, unspecified: Secondary | ICD-10-CM | POA: Diagnosis not present

## 2023-12-24 NOTE — Progress Notes (Signed)
 Established patient visit   Patient: Alexis Lyons   DOB: 1941-07-30   83 y.o. Female  MRN: [id number] Visit Date: 12/24/2023  Today's healthcare provider: Jon Eva, MD   Chief Complaint  Patient presents with   Hyperlipidemia    Patient would like to see if she can stop statin   Subjective    HPI HPI     Hyperlipidemia   This is a new problem.  Current therapy includes statins.  Compliance with treatment is excellent.  Chest pain: Absent.  Chest pressure/discomfort: Absent.  Dyspnea: Absent.  Focal weakness: Absent.  Lower extremity edema: Absent.  Numbness or tingling of extremity: Absent.  Orthopnea: Absent.  Palpitations: Absent.  Paroxysmal nocturnal dyspnea: Absent.  Speech difficulty: Absent.  Syncope: Absent.  The patient exercises daily. Additional comments: Patient would like to see if she can stop statin        Comments   Patient present for cholesterol concerns      Last edited by Lilian Fitzpatrick, CMA on 12/24/2023  1:20 PM.       Discussed the use of AI scribe software for clinical note transcription with the patient, who gave verbal consent to proceed.  History of Present Illness   The patient, with a history of transient ischemic attack (TIA) and mild hyperlipidemia, presents with concerns about their cholesterol medication, Lipitor. The patient also mentions an incident of chest pain after physical exertion, which resolved with icing. They have been taking the Lipitor since November and are considering discontinuing it due to these concerns.  In addition to the concerns about Lipitor, the patient reports a recurring issue of ear blockage, particularly in the morning. They have tried over-the-counter ear drops, but the issue persists. The patient also mentions a previous sinus issue, which was resolved with a Z-Pak. They are due to fly soon and are concerned about the ear blockage issue during the flight.         Medications: Outpatient  Medications Prior to Visit  Medication Sig   apixaban  (ELIQUIS ) 2.5 MG TABS tablet Take 1 tablet (2.5 mg total) by mouth 2 (two) times daily.   atorvastatin  (LIPITOR) 40 MG tablet Take 1 tablet (40 mg total) by mouth daily.   conjugated estrogens  (PREMARIN ) vaginal cream Place 1 Applicatorful vaginally 2 (two) times a week.   Cyanocobalamin  (B-12 PO) Take by mouth daily.   hydrochlorothiazide  (HYDRODIURIL ) 12.5 MG tablet TAKE 1 TABLET BY MOUTH EVERY DAY   metoprolol  succinate (TOPROL  XL) 25 MG 24 hr tablet Take 0.5 tablets (12.5 mg total) by mouth daily.   zolpidem  (AMBIEN ) 5 MG tablet TAKE 1/2 TABLET (2.5 MG TOTAL) BY MOUTH AT BEDTIME AS NEEDED FOR SLEEP   No facility-administered medications prior to visit.    Review of Systems     Objective    BP 133/87 (BP Location: Left Arm, Patient Position: Sitting, Cuff Size: Normal)   Pulse 68   Ht 5' 3 (1.6 m)   Wt 125 lb 6.4 oz (56.9 kg)   SpO2 99%   BMI 22.21 kg/m    Physical Exam Vitals reviewed.  Constitutional:      General: She is not in acute distress.    Appearance: Normal appearance. She is well-developed.  HENT:     Head: Normocephalic and atraumatic.     Right Ear: Tympanic membrane, ear canal and external ear normal.     Left Ear: Tympanic membrane, ear canal and external ear normal.  Eyes:  General: No scleral icterus.    Conjunctiva/sclera: Conjunctivae normal.  Cardiovascular:     Rate and Rhythm: Normal rate and regular rhythm.     Heart sounds: Normal heart sounds.  Pulmonary:     Effort: Pulmonary effort is normal. No respiratory distress.  Skin:    General: Skin is warm and dry.     Findings: No rash.  Neurological:     Mental Status: She is alert and oriented to person, place, and time.  Psychiatric:        Behavior: Behavior normal.      No results found for any visits on 12/24/23.  Assessment & Plan     Problem List Items Addressed This Visit       Other   Elevated LDL cholesterol level  - Primary   History of transient ischemic attack (TIA)   Other Visit Diagnoses       Chest wall pain         Dysfunction of left eustachian tube               Transient Ischemic Attack (TIA) TIA in November 2024 with vision loss and speech difficulty. No stroke on CT scan/MRI. Secondary prevention measures are necessary due to increased stroke risk. Statin therapy is for secondary prevention, not solely based on cholesterol levels. Emphasized importance of preventing future strokes due to high risk of disability. - Continue atorvastatin  40 mg daily - Recheck cholesterol levels at physical in April - Monitor for unprovoked muscle aches  Hyperlipidemia Elevated LDL managed with atorvastatin . Good diet and exercise habits. Statin therapy is for secondary prevention due to TIA, not solely based on cholesterol levels. High potency statins have the best prevention data for secondary prevention. - Continue atorvastatin  40 mg daily - Recheck cholesterol levels at physical in April  Musculoskeletal Chest Pain Chest and shoulder pain likely due to musculoskeletal strain from physical activity. Symptoms include soreness radiating to shoulder and arm, relieved by icing. Chest pain workup negative recently.  Muscle aches from statins are usually unprovoked and more common in legs and low back. - Apply heat to affected area - Take Tylenol  as needed for pain - Avoid heavy lifting and strenuous activities  Eustachian Tube Dysfunction Blocked ear sensation without pain, likely due to eustachian tube dysfunction. No earwax or infection present. Symptoms exacerbated by recent travel. Eustachian tube dysfunction causes pressure and hearing issues, often relieved by nasal sprays and maneuvers to equalize pressure. - Use Flonase nasal spray, 2 sprays each nostril once daily for 2-4 weeks - Use saline nasal spray as needed - Perform Valsalva maneuver during flights to relieve pressure  Follow-up -  Schedule physical exam for April 04, 2024 or later - Recheck cholesterol levels at physical in April.          Return in about 3 months (around 03/23/2024) for CPE.      Total time spent on today's visit was greater than 30 minutes, including both face-to-face time and nonface-to-face time personally spent on review of chart (labs and imaging), discussing labs and goals, discussing further work-up, treatment options, answering patient's questions, and coordinating care.   Jon Eva, MD  [facility] [phone] (phone) [phone] (fax)  [facility]

## 2023-12-29 ENCOUNTER — Other Ambulatory Visit: Payer: Self-pay | Admitting: Family Medicine

## 2024-01-20 ENCOUNTER — Telehealth: Payer: Self-pay | Admitting: Family Medicine

## 2024-01-20 ENCOUNTER — Other Ambulatory Visit: Payer: Self-pay

## 2024-01-20 ENCOUNTER — Emergency Department
Admission: EM | Admit: 2024-01-20 | Discharge: 2024-01-20 | Disposition: A | Discharge status: Home / Self Care | Payer: HMO | Attending: Emergency Medicine | Admitting: Emergency Medicine

## 2024-01-20 ENCOUNTER — Emergency Department: Payer: HMO

## 2024-01-20 DIAGNOSIS — I10 Essential (primary) hypertension: Secondary | ICD-10-CM | POA: Diagnosis not present

## 2024-01-20 DIAGNOSIS — R0789 Other chest pain: Secondary | ICD-10-CM | POA: Insufficient documentation

## 2024-01-20 DIAGNOSIS — E876 Hypokalemia: Secondary | ICD-10-CM | POA: Insufficient documentation

## 2024-01-20 DIAGNOSIS — R079 Chest pain, unspecified: Secondary | ICD-10-CM | POA: Diagnosis not present

## 2024-01-20 LAB — BASIC METABOLIC PANEL
Anion gap: 10 (ref 5–15)
BUN: 16 mg/dL (ref 8–23)
CO2: 28 mmol/L (ref 22–32)
Calcium: 9.3 mg/dL (ref 8.9–10.3)
Chloride: 104 mmol/L (ref 98–111)
Creatinine, Ser: 0.76 mg/dL (ref 0.44–1.00)
GFR, Estimated: >60 mL/min (ref >60)
Glucose, Bld: 111 mg/dL — ABNORMAL HIGH (ref 70–99)
Potassium: 2.9 mmol/L — ABNORMAL LOW (ref 3.5–5.1)
Sodium: 142 mmol/L (ref 135–145)

## 2024-01-20 LAB — CBC
HCT: 43.2 % (ref 36.0–46.0)
Hemoglobin: 14.7 g/dL (ref 12.0–15.0)
MCH: 30.5 pg (ref 26.0–34.0)
MCHC: 34 g/dL (ref 30.0–36.0)
MCV: 89.6 fL (ref 80.0–100.0)
Platelets: 217 10*3/uL (ref 150–400)
RBC: 4.82 MIL/uL (ref 3.87–5.11)
RDW: 12.1 % (ref 11.5–15.5)
WBC: 4.8 10*3/uL (ref 4.0–10.5)
nRBC: 0 % (ref 0.0–0.2)

## 2024-01-20 LAB — TROPONIN I (HIGH SENSITIVITY): Troponin I (High Sensitivity): 5 ng/L (ref <18)

## 2024-01-20 MED ORDER — POTASSIUM CHLORIDE CRYS ER 20 MEQ PO TBCR
40.0000 meq | EXTENDED_RELEASE_TABLET | Freq: Once | ORAL | Status: AC
  Administered 2024-01-20: 40 meq via ORAL
  Filled 2024-01-20: qty 2

## 2024-01-20 NOTE — ED Provider Notes (Signed)
[facility] Provider Note    Event Date/Time   First MD Initiated Contact with Patient 01/20/24 [phone]     (approximate)   History   Chest Pain   HPI Alexis Lyons is a 83 y.o. female with history of paroxysmal A-fib, HTN, HLD presenting today for chest wall pain.  Patient had onset of symptoms 2 months ago with intermittent present since then.  She has been seen in the ED, by cardiology, and by her PCP with largely reassuring cardiac workup and has been treated for musculoskeletal pain symptoms.  She states they have continued to be ongoing.  Intermittent pain in her left arm as well.  Otherwise denies shortness of breath, nausea, vomiting, diaphoresis.  Chart review: Reviewed cardiology notes including recent CT coronary within the past year which was reassuring.     Physical Exam   Triage Vital Signs: ED Triage Vitals  Encounter Vitals Group     BP 01/20/24 0610 (!) 179/101     Systolic BP Percentile --      Diastolic BP Percentile --      Pulse Rate 01/20/24 0610 78     Resp 01/20/24 0610 18     Temp 01/20/24 0608 97.8 F (36.6 C)     Temp Source 01/20/24 0608 Oral     SpO2 01/20/24 0610 98 %     Weight --      Height --      Head Circumference --      Peak Flow --      Pain Score 01/20/24 0608 4     Pain Loc --      Pain Education --      Exclude from Growth Chart --     Most recent vital signs: Vitals:   01/20/24 0610 01/20/24 0739  BP: (!) 179/101 136/85  Pulse: 78 66  Resp: 18 17  Temp:  98.8 F (37.1 C)  SpO2: 98% 99%   I have reviewed the vital signs. General:  Awake, alert, no acute distress. Head:  Normocephalic, Atraumatic. EENT:  PERRL, EOMI, Oral mucosa pink and moist, Neck is supple. Cardiovascular: Regular rate, 2+ distal pulses. Respiratory:  Normal respiratory effort, symmetrical expansion, no distress.   Extremities:  Moving all four extremities through full ROM without pain.  Mildly positive Neer's test of the  left shoulder.  Slight tenderness palpation over left chest wall. Neuro:  Alert and oriented.  Interacting appropriately.   Skin:  Warm, dry, no rash.   Psych: Appropriate affect.    ED Results / Procedures / Treatments   Labs (all labs ordered are listed, but only abnormal results are displayed) Labs Reviewed  BASIC METABOLIC PANEL - Abnormal; Notable for the following components:      Result Value   Potassium 2.9 (*)    Glucose, Bld 111 (*)    All other components within normal limits  CBC  TROPONIN I (HIGH SENSITIVITY)  TROPONIN I (HIGH SENSITIVITY)     EKG My EKG interpretation: Rate of 71, normal sinus rhythm, normal axis, normal intervals.  No acute ST elevations or depressions   RADIOLOGY Independently interpreted chest x-ray with no acute pathology   PROCEDURES:  Critical Care performed: No  Procedures   MEDICATIONS ORDERED IN ED: Medications  potassium chloride SA (KLOR-CON M) CR tablet 40 mEq (40 mEq Oral Given 01/20/24 0741)     IMPRESSION / MDM / ASSESSMENT AND PLAN / ED COURSE  I reviewed the triage vital  signs and the nursing notes.                              Differential diagnosis includes, but is not limited to, chest wall pain, low suspicion ACS, pneumonia, pneumothorax, MSK, anxiety  Patient's presentation is most consistent with acute complicated illness / injury requiring diagnostic workup.  Patient is an 83 year old female presenting today for 2 months of ongoing intermittent left-sided chest wall pain.  EKG unremarkable and troponin negative here today.  Chest x-ray unremarkable.  Laboratory workup otherwise only notable for hypokalemia at 2.9.  She was given oral repletion.  Patient has been seen in the ED, cardiology clinic, and by her PCP with largely negative and reassuring cardiology evaluation.  This does seem much more consistent with musculoskeletal pain which she has been trying to treat by herself at home.  I feel at this point she  may benefit from physical therapy and recommend her contact her PCP to get this established.  She was also given information for potassium supplementation in her diet given her hypokalemia today.  No other concerning findings.  Given strict return precautions for any worsening symptoms.     FINAL CLINICAL IMPRESSION(S) / ED DIAGNOSES   Final diagnoses:  Chest wall pain  Hypokalemia     Rx / DC Orders   ED Discharge Orders     None        Note:  This document was prepared using Dragon voice recognition software and may include unintentional dictation errors.   [name], MD 01/20/24 [phone]

## 2024-01-20 NOTE — ED Triage Notes (Signed)
Reports intermittent chest wall soreness since Thanksgiving.

## 2024-01-20 NOTE — Telephone Encounter (Signed)
Patient called to inquire about a referral to physical therapy. Patient was seen today in the ED for chest pain. Please advise patient [phone].

## 2024-01-20 NOTE — Discharge Instructions (Signed)
Please reach out to your primary care provider to discuss follow-up with physical therapy and get a referral.  You can pick up over-the-counter potassium supplementation as needed.  The rest of your workup today was reassuring.

## 2024-01-20 NOTE — ED Triage Notes (Signed)
Pt presents via POV c/o left "chest wall pain" and left arm soreness.   Reports seen the day after Thanksgiving for similar symptoms.   Denies injury to chest or arm. A&O x4.

## 2024-01-20 NOTE — Telephone Encounter (Signed)
Ok to send PT referral (see ER note) for chest wall pain

## 2024-01-21 NOTE — Addendum Note (Signed)
Addended by: Marjie Skiff on: 01/21/2024 01:56 PM   Modules accepted: Orders

## 2024-01-21 NOTE — Telephone Encounter (Signed)
 Referral placed.

## 2024-01-29 ENCOUNTER — Ambulatory Visit: Payer: HMO | Attending: Family Medicine | Admitting: Physical Therapy

## 2024-01-29 DIAGNOSIS — M6281 Muscle weakness (generalized): Secondary | ICD-10-CM | POA: Diagnosis not present

## 2024-01-29 DIAGNOSIS — R2681 Unsteadiness on feet: Secondary | ICD-10-CM | POA: Insufficient documentation

## 2024-01-29 DIAGNOSIS — M79602 Pain in left arm: Secondary | ICD-10-CM | POA: Insufficient documentation

## 2024-01-29 DIAGNOSIS — R262 Difficulty in walking, not elsewhere classified: Secondary | ICD-10-CM | POA: Diagnosis not present

## 2024-01-29 DIAGNOSIS — M25512 Pain in left shoulder: Secondary | ICD-10-CM | POA: Insufficient documentation

## 2024-01-29 DIAGNOSIS — R269 Unspecified abnormalities of gait and mobility: Secondary | ICD-10-CM | POA: Diagnosis not present

## 2024-01-29 DIAGNOSIS — R0789 Other chest pain: Secondary | ICD-10-CM | POA: Diagnosis not present

## 2024-01-29 DIAGNOSIS — R2689 Other abnormalities of gait and mobility: Secondary | ICD-10-CM | POA: Diagnosis not present

## 2024-01-29 NOTE — Therapy (Signed)
OUTPATIENT PHYSICAL THERAPY THORACOLUMBAR EVALUATION   Patient Name: Alexis Lyons MRN: [id number] DOB:01-31-1941, 83 y.o., female Today's Date: 01/29/2024  END OF SESSION:  PT End of Session - 01/29/24 1648     Visit Number 1    Number of Visits 24    Date for PT Re-Evaluation 04/22/24    Progress Note Due on Visit 10    PT Start Time 1403    PT Stop Time 1445    PT Time Calculation (min) 42 min    Activity Tolerance Patient tolerated treatment well;Patient limited by pain    Behavior During Therapy [facility] for tasks assessed/performed             Past Medical History:  Diagnosis Date   Amnesia    Aphthae    Atrial fibrillation (HCC)    Congestion-fibrosis syndrome    COVID 12/12/2022   DD (diverticular disease)    GERD (gastroesophageal reflux disease)    Seizures (HCC) 12/2017   passed out due to dehydration   TIA (transient ischemic attack) 10/2023   Past Surgical History:  Procedure Laterality Date   ATRIAL FIBRILLATION ABLATION N/A 11/02/2021   Procedure: ATRIAL FIBRILLATION ABLATION;  Surgeon: Lanier Prude, MD;  Location: [facility] INVASIVE CV LAB;  Service: Cardiovascular;  Laterality: N/A;   CHOLECYSTECTOMY     colonoscopy with polypectomy     8/11 4 mm polyp, 12 mm polyp   COLONOSCOPY WITH PROPOFOL N/A 02/24/2018   Procedure: COLONOSCOPY WITH PROPOFOL;  Surgeon: Scot Jun, MD;  Location: [facility] ENDOSCOPY;  Service: Endoscopy;  Laterality: N/A;   HERNIA REPAIR Right 2004   inguinal    HERNIA REPAIR     x's 3   Patient Active Problem List   Diagnosis Date Noted   History of transient ischemic attack (TIA) 11/08/2023   Acute frontal sinusitis 11/08/2023   Non-seasonal allergic rhinitis 12/20/2022   Headache, rebound 12/20/2022   Hypokalemia 08/16/2022   Hypocalcemia 08/16/2022   Syncope and collapse 08/16/2022   Abnormal EKG 08/16/2022   Hypomagnesemia 08/16/2022   Toe tendonitis 08/15/2022   Elevated LDL cholesterol level 08/15/2022    Strain of right deltoid muscle 01/11/2022   Neuropathy 10/09/2021   Tingling sensation 09/15/2021   Paroxysmal atrial fibrillation (HCC) 06/15/2021   Senile purpura (HCC) 06/15/2021   Chronic headache 01/01/2019   Essential hypertension 07/09/2018   Osteopenia 11/06/2017   Aphthae 05/28/2015   DD (diverticular disease) 05/28/2015   Fatigue 05/28/2015   Hernia, inguinal, right 05/28/2015   Amnesia 05/28/2015   Congestion-fibrosis syndrome 05/28/2015   Atrophy of vagina 05/28/2015   Vulvar lesion 09/21/2008   Insomnia 12/17/2004    PCP:    Erasmo Downer, MD    REFERRING PROVIDER:    Erasmo Downer, MD    REFERRING DIAG: R07.89 (ICD-10-CM) - Chest wall pain   Rationale for Evaluation and Treatment: Rehabilitation  THERAPY DIAG:  Abnormality of gait and mobility  Difficulty in walking, not elsewhere classified  Muscle weakness (generalized)  Other abnormalities of gait and mobility  Unsteadiness on feet  ONSET DATE: 11/08/23  SUBJECTIVE:  SUBJECTIVE STATEMENT: Pt first went to hospital the 22 of October for loss of vision in her right eye and was unable to speak clearly. Pt went to the ER and they found it was TIA at that time and she was prescribed statins for her cholesterol. Fast forward Pt was picking up large hanging plants and the next day her chest and shoulder were in significant pain. Pt went back to ED and they thought it was musculoskeletal in nature. Pt instructed to use ice and heat and try PT. Pt has been trying not to lift anything heavy since.  Last week pt woke up with achiness and soreness in her L arm and went back to MD for this. Pt did not get an ultrasound or anything but did get an EKG and x ray ruling out heart problems. Pt last had chest pain when    PERTINENT HISTORY:  AFIB, GERD, TIA  PAIN:  Are you having pain? Yes: NPRS scale: 7 Pain location: L arm Pain description: Ache  Aggravating factors: UE functional use  Relieving factors: rest   PRECAUTIONS: None  RED FLAGS: None   WEIGHT BEARING RESTRICTIONS: No  FALLS:  Has patient fallen in last 6 months? No  LIVING ENVIRONMENT: Lives with: lives alone Lives in: House/apartment Stairs: Yes: External: 1 steps; none Has following equipment at home: None  OCCUPATION: Retired  PLOF: Independent  PATIENT GOALS: Improve shoulder pain, get back to pilates, improve chest pain after exercise, get back to gardening  NEXT MD VISIT: None scheduled  OBJECTIVE:  Note: Objective measures were completed at Evaluation unless otherwise noted.  DIAGNOSTIC FINDINGS:  N/a  PATIENT SURVEYS:  Quick Dash 29.5%  COGNITION: Overall cognitive status: Within functional limits for tasks assessed     SENSATION: Light touch: WFL  POSTURE: rounded shoulders  PALPATION: Tenderness to palpation of left supraspinatus, infraspinatus, and levator scapula muscle.  Cause some discomfort following palpation as well.   CERVICAL ROM:   Active ROM A/PROM (deg) eval  Flexion WNL  Extension WNL  Right lateral flexion Limited  Left lateral flexion Limited  Right rotation   Left rotation    (Blank rows = not tested)  UPPER EXTREMITY ROM:  WNL with exception of left internal rotation that caused pain in earlier palpated areas  UPPER EXTREMITY MMT:  MMT Right eval Left eval  Shoulder flexion 4 4  Shoulder extension    Shoulder abduction 4 4  Shoulder adduction    Shoulder extension    Shoulder internal rotation 4 4  Shoulder external rotation 4 4  Middle trapezius    Lower trapezius    Elbow flexion    Elbow extension    Wrist flexion    Wrist extension    Wrist ulnar deviation    Wrist radial deviation    Wrist pronation    Wrist supination    Grip strength      (Blank rows = not tested)    TREATMENT DATE: 01/29/24    Self Care  Patient instructed in plan of care, findings for evaluation, and ways of physical therapy may improve her function and quality of life.    PATIENT EDUCATION:  Education details: POC Person educated: Patient Education method: Explanation Education comprehension: verbalized understanding   HOME EXERCISE PROGRAM: Establish visit 2    ASSESSMENT:  CLINICAL IMPRESSION: Patient is a 83 year old female who presents to physical therapy with complaints of left anterior chest pain, left shoulder pain, left arm tingling and discomfort, and left hand altered sensation and tingling.  Patient demonstrates some weakness in her rotator cuff muscles as well as tenderness to palpation in these areas.  Will need to further evaluate neurological symptoms and hand with potential for neural tension testing next session as well as further evaluation of chest wall discomfort.  It is important to note that patient has been thoroughly examined for cardiac related chest wall potential diagnoses.  Patient will benefit from skilled physical therapy to improve her neck range of motion, left upper extremity strength and function.  OBJECTIVE IMPAIRMENTS: decreased activity tolerance, decreased strength, and pain.   ACTIVITY LIMITATIONS: carrying, lifting, and reach over head  PARTICIPATION LIMITATIONS: shopping and yard work  PERSONAL FACTORS: 1-2 comorbidities: AFIB, GERD, TIA  are also affecting patient's functional outcome.   REHAB POTENTIAL: Good  CLINICAL DECISION MAKING: Evolving/moderate complexity  EVALUATION COMPLEXITY: Moderate   GOALS: Goals reviewed with patient? Yes  SHORT TERM GOALS: Target date: 02/26/2024      Patient will be independent in home exercise program to improve strength/mobility for  better functional independence with ADLs. Baseline: No HEP currently  Goal status: INITIAL   LONG TERM GOALS: Target date: 02/12/2024    1. Patient will increase QuickDASH score by 10% or greater to indicate improved subjective level of function of the left upper extremity  baseline: 29.5% Goal status: INITIAL  2. Patient will improve ER and IR strength on the LUE without reports of pain to indicate improved functional use of the L UE without pain . Baseline:  See eval  Goal status: INITIAL  3.  Patient will report mild difficulty or less for recreational activities where she put some force or impact through her arm such as her Pilates activities. Baseline: Unable to at this time Goal status: INITIAL   PLAN:  PT FREQUENCY: 2x/week  PT DURATION: 12 weeks  PLANNED INTERVENTIONS: 97164- PT Re-evaluation, 97110-Therapeutic exercises, 97530- Therapeutic activity, 97112- Neuromuscular re-education, 97535- Self Care, 16109- Manual therapy, Dry Needling, Joint mobilization, and Spinal mobilization.  PLAN FOR NEXT SESSION: clear C spine, UL neural tension testing, Hep for isometric strength and cervical ROM.    Norman Herrlich, PT 01/29/2024, 4:52 PM

## 2024-02-03 ENCOUNTER — Ambulatory Visit: Payer: HMO | Admitting: Physical Therapy

## 2024-02-06 ENCOUNTER — Ambulatory Visit: Payer: HMO | Admitting: Physical Therapy

## 2024-02-10 ENCOUNTER — Ambulatory Visit: Payer: HMO | Admitting: Physical Therapy

## 2024-02-12 ENCOUNTER — Ambulatory Visit: Payer: HMO | Admitting: Physical Therapy

## 2024-02-12 DIAGNOSIS — M6281 Muscle weakness (generalized): Secondary | ICD-10-CM

## 2024-02-12 DIAGNOSIS — M25512 Pain in left shoulder: Secondary | ICD-10-CM

## 2024-02-12 DIAGNOSIS — M79602 Pain in left arm: Secondary | ICD-10-CM

## 2024-02-12 NOTE — Therapy (Addendum)
 OUTPATIENT PHYSICAL THERAPY THORACOLUMBAR TREATMENT   Patient Name: Alexis Lyons MRN: [id number] DOB:05/21/1941, 83 y.o., female Today's Date: 02/12/2024  END OF SESSION:  PT End of Session - 02/12/24 1232     Visit Number 2    Number of Visits 24    Date for PT Re-Evaluation 04/22/24    Progress Note Due on Visit 10    PT Start Time 1105    PT Stop Time 1143    PT Time Calculation (min) 38 min    Activity Tolerance Patient tolerated treatment well;Patient limited by pain    Behavior During Therapy [facility] for tasks assessed/performed              Past Medical History:  Diagnosis Date   Amnesia    Aphthae    Atrial fibrillation (HCC)    Congestion-fibrosis syndrome    COVID 12/12/2022   DD (diverticular disease)    GERD (gastroesophageal reflux disease)    Seizures (HCC) 12/2017   passed out due to dehydration   TIA (transient ischemic attack) 10/2023   Past Surgical History:  Procedure Laterality Date   ATRIAL FIBRILLATION ABLATION N/A 11/02/2021   Procedure: ATRIAL FIBRILLATION ABLATION;  Surgeon: Lanier Prude, MD;  Location: [facility] INVASIVE CV LAB;  Service: Cardiovascular;  Laterality: N/A;   CHOLECYSTECTOMY     colonoscopy with polypectomy     8/11 4 mm polyp, 12 mm polyp   COLONOSCOPY WITH PROPOFOL N/A 02/24/2018   Procedure: COLONOSCOPY WITH PROPOFOL;  Surgeon: Scot Jun, MD;  Location: [facility] ENDOSCOPY;  Service: Endoscopy;  Laterality: N/A;   HERNIA REPAIR Right 2004   inguinal    HERNIA REPAIR     x's 3   Patient Active Problem List   Diagnosis Date Noted   History of transient ischemic attack (TIA) 11/08/2023   Acute frontal sinusitis 11/08/2023   Non-seasonal allergic rhinitis 12/20/2022   Headache, rebound 12/20/2022   Hypokalemia 08/16/2022   Hypocalcemia 08/16/2022   Syncope and collapse 08/16/2022   Abnormal EKG 08/16/2022   Hypomagnesemia 08/16/2022   Toe tendonitis 08/15/2022   Elevated LDL cholesterol level 08/15/2022    Strain of right deltoid muscle 01/11/2022   Neuropathy 10/09/2021   Tingling sensation 09/15/2021   Paroxysmal atrial fibrillation (HCC) 06/15/2021   Senile purpura (HCC) 06/15/2021   Chronic headache 01/01/2019   Essential hypertension 07/09/2018   Osteopenia 11/06/2017   Aphthae 05/28/2015   DD (diverticular disease) 05/28/2015   Fatigue 05/28/2015   Hernia, inguinal, right 05/28/2015   Amnesia 05/28/2015   Congestion-fibrosis syndrome 05/28/2015   Atrophy of vagina 05/28/2015   Vulvar lesion 09/21/2008   Insomnia 12/17/2004    PCP:    Erasmo Downer, MD    REFERRING PROVIDER:    Erasmo Downer, MD    REFERRING DIAG: R07.89 (ICD-10-CM) - Chest wall pain   Rationale for Evaluation and Treatment: Rehabilitation  THERAPY DIAG:  Muscle weakness (generalized)  Acute pain of left shoulder  Pain in left arm  ONSET DATE: 11/08/23  SUBJECTIVE:  SUBJECTIVE STATEMENT: Pt first went to hospital the 22 of October for loss of vision in her right eye and was unable to speak clearly. Pt went to the ER and they found it was TIA at that time and she was prescribed statins for her cholesterol. Fast forward Pt was picking up large hanging plants and the next day her chest and shoulder were in significant pain. Pt went back to ED and they thought it was musculoskeletal in nature. Pt instructed to use ice and heat and try PT. Pt has been trying not to lift anything heavy since.  Last week pt woke up with achiness and soreness in her L arm and went back to MD for this. Pt did not get an ultrasound or anything but did get an EKG and x ray ruling out heart problems.  PERTINENT HISTORY:  AFIB, GERD, TIA  PAIN:  Are you having pain? Yes: NPRS scale: 7 Pain location: L arm Pain description: Ache   Aggravating factors: UE functional use  Relieving factors: rest   PRECAUTIONS: None  RED FLAGS: None   WEIGHT BEARING RESTRICTIONS: No  FALLS:  Has patient fallen in last 6 months? No  LIVING ENVIRONMENT: Lives with: lives alone Lives in: House/apartment Stairs: Yes: External: 1 steps; none Has following equipment at home: None  OCCUPATION: Retired  PLOF: Independent  PATIENT GOALS: Improve shoulder pain, get back to pilates, improve chest pain after exercise, get back to gardening  NEXT MD VISIT: None scheduled  OBJECTIVE:  Note: Objective measures were completed at Evaluation unless otherwise noted.  DIAGNOSTIC FINDINGS:  N/a  PATIENT SURVEYS:  Quick Dash 29.5%  COGNITION: Overall cognitive status: Within functional limits for tasks assessed     SENSATION: Light touch: WFL  POSTURE: rounded shoulders  PALPATION: Tenderness to palpation of left supraspinatus, infraspinatus, and levator scapula muscle.  Cause some discomfort following palpation as well.   CERVICAL ROM:   Active ROM A/PROM (deg) eval  Flexion WNL  Extension WNL  Right lateral flexion Limited  Left lateral flexion Limited  Right rotation   Left rotation    (Blank rows = not tested)  UPPER EXTREMITY ROM:  WNL with exception of left internal rotation that caused pain in earlier palpated areas  UPPER EXTREMITY MMT:  MMT Right eval Left eval  Shoulder flexion 4 4  Shoulder extension    Shoulder abduction 4 4  Shoulder adduction    Shoulder extension    Shoulder internal rotation 4 4  Shoulder external rotation 4 4  Middle trapezius    Lower trapezius    Elbow flexion    Elbow extension    Wrist flexion    Wrist extension    Wrist ulnar deviation    Wrist radial deviation    Wrist pronation    Wrist supination    Grip strength     (Blank rows = not tested)    TREATMENT DATE: 02/12/24 TE- To improve strength, endurance, mobility, and function of specific targeted  muscle groups or improve joint range of motion or improve muscle flexibility Negative median nerve neural tension Positive radial nerve neural tension   X 4 min radial nerve glides using wrist and elbow movements, pt reports some increased tingling but not too bad  Moist Heat to upper traps while pt performs supine scapulr retractions x 10  2 x R and L uUT stretch x 45 sec ea  2 x R and L cervical rotation stretch x 45 sec ea  2  x 10 L isometric IR and then ER  2 x 10 shoulder rolls, cues for decreased UT recruitment and focus on other portions of movement.  HEP education and handout provided  PATIENT EDUCATION:  Education details: POC Person educated: Patient Education method: Explanation Education comprehension: verbalized understanding   HOME EXERCISE PROGRAM: Access Code: [email or web address] URL: [email or web address] Date: 02/12/2024 Prepared by: Thresa Ross  Exercises - Supine Scapular Retraction  - 1 x daily - 7 x weekly - 2 sets - 10 reps - Standing Isometric Shoulder Internal Rotation at Doorway  - 1 x daily - 7 x weekly - 2 sets - 10 reps - Isometric Shoulder External Rotation at Wall  - 1 x daily - 7 x weekly - 2 sets - 10 reps - Seated Shoulder Rolls  - 1 x daily - 7 x weekly - 2 sets - 10 reps   ASSESSMENT:  CLINICAL IMPRESSION: Patient presents with good motivation for completion of physical therapy activities.  Patient reports significant improvement in pain and symptoms in the day following physical therapy evaluation.  Patient reports no pain in her shoulders and has not had any pain in her arm since that day.  Patient introduced to initial home exercise program focusing on very gentle scapular muscle activation as well as postural muscle activation.  Patient also trialed with gentle upper trap and neck stretches to improve her cervical mobility and function.  Patient also provided with radial nerve glides as she did respond positively to radial nerve neural  tension testing causing increased tingling in her fingers.  Will continue to assess this in future sessions and if patient tolerates well may add this to a home exercises if appropriate.Pt will continue to benefit from skilled physical therapy intervention to address impairments, improve QOL, and attain therapy goals.    OBJECTIVE IMPAIRMENTS: decreased activity tolerance, decreased strength, and pain.   ACTIVITY LIMITATIONS: carrying, lifting, and reach over head  PARTICIPATION LIMITATIONS: shopping and yard work  PERSONAL FACTORS: 1-2 comorbidities: AFIB, GERD, TIA  are also affecting patient's functional outcome.   REHAB POTENTIAL: Good  CLINICAL DECISION MAKING: Evolving/moderate complexity  EVALUATION COMPLEXITY: Moderate   GOALS: Goals reviewed with patient? Yes  SHORT TERM GOALS: Target date: 02/26/2024      Patient will be independent in home exercise program to improve strength/mobility for better functional independence with ADLs. Baseline: No HEP currently  Goal status: INITIAL   LONG TERM GOALS: Target date: 02/12/2024    1. Patient will increase QuickDASH score by 10% or greater to indicate improved subjective level of function of the left upper extremity  baseline: 29.5% Goal status: INITIAL  2. Patient will improve ER and IR strength on the LUE without reports of pain to indicate improved functional use of the L UE without pain . Baseline:  See eval  Goal status: INITIAL  3.  Patient will report mild difficulty or less for recreational activities where she put some force or impact through her arm such as her Pilates activities. Baseline: Unable to at this time Goal status: INITIAL   PLAN:  PT FREQUENCY: 2x/week  PT DURATION: 12 weeks  PLANNED INTERVENTIONS: 97164- PT Re-evaluation, 97110-Therapeutic exercises, 97530- Therapeutic activity, 97112- Neuromuscular re-education, 97535- Self Care, 16109- Manual therapy, Dry Needling, Joint mobilization, and  Spinal mobilization.  PLAN FOR NEXT SESSION: clear C spine, UL neural tension testing, Hep for isometric strength and cervical ROM.    Norman Herrlich, PT 02/12/2024, 12:32 PM

## 2024-02-17 ENCOUNTER — Ambulatory Visit: Payer: HMO | Attending: Family Medicine | Admitting: Physical Therapy

## 2024-02-17 DIAGNOSIS — R262 Difficulty in walking, not elsewhere classified: Secondary | ICD-10-CM | POA: Insufficient documentation

## 2024-02-17 DIAGNOSIS — M6281 Muscle weakness (generalized): Secondary | ICD-10-CM | POA: Diagnosis not present

## 2024-02-17 DIAGNOSIS — R2689 Other abnormalities of gait and mobility: Secondary | ICD-10-CM | POA: Insufficient documentation

## 2024-02-17 DIAGNOSIS — M25512 Pain in left shoulder: Secondary | ICD-10-CM | POA: Diagnosis not present

## 2024-02-17 DIAGNOSIS — R2681 Unsteadiness on feet: Secondary | ICD-10-CM | POA: Insufficient documentation

## 2024-02-17 DIAGNOSIS — M79602 Pain in left arm: Secondary | ICD-10-CM | POA: Diagnosis not present

## 2024-02-17 DIAGNOSIS — R269 Unspecified abnormalities of gait and mobility: Secondary | ICD-10-CM | POA: Diagnosis not present

## 2024-02-17 NOTE — Therapy (Unsigned)
 OUTPATIENT PHYSICAL THERAPY THORACOLUMBAR TREATMENT   Patient Name: Alexis Lyons MRN: [id number] DOB:June 11, 1941, 83 y.o., female Today's Date: 02/18/2024  END OF SESSION:  PT End of Session - 02/18/24 4098     Visit Number 3    Number of Visits 24    Date for PT Re-Evaluation 04/22/24    Progress Note Due on Visit 10    PT Start Time 0425    PT Stop Time 0459    PT Time Calculation (min) 34 min    Activity Tolerance Patient tolerated treatment well;Patient limited by pain    Behavior During Therapy [facility] for tasks assessed/performed               Past Medical History:  Diagnosis Date   Amnesia    Aphthae    Atrial fibrillation (HCC)    Congestion-fibrosis syndrome    COVID 12/12/2022   DD (diverticular disease)    GERD (gastroesophageal reflux disease)    Seizures (HCC) 12/2017   passed out due to dehydration   TIA (transient ischemic attack) 10/2023   Past Surgical History:  Procedure Laterality Date   ATRIAL FIBRILLATION ABLATION N/A 11/02/2021   Procedure: ATRIAL FIBRILLATION ABLATION;  Surgeon: Lanier Prude, MD;  Location: [facility] INVASIVE CV LAB;  Service: Cardiovascular;  Laterality: N/A;   CHOLECYSTECTOMY     colonoscopy with polypectomy     8/11 4 mm polyp, 12 mm polyp   COLONOSCOPY WITH PROPOFOL N/A 02/24/2018   Procedure: COLONOSCOPY WITH PROPOFOL;  Surgeon: Scot Jun, MD;  Location: [facility] ENDOSCOPY;  Service: Endoscopy;  Laterality: N/A;   HERNIA REPAIR Right 2004   inguinal    HERNIA REPAIR     x's 3   Patient Active Problem List   Diagnosis Date Noted   History of transient ischemic attack (TIA) 11/08/2023   Acute frontal sinusitis 11/08/2023   Non-seasonal allergic rhinitis 12/20/2022   Headache, rebound 12/20/2022   Hypokalemia 08/16/2022   Hypocalcemia 08/16/2022   Syncope and collapse 08/16/2022   Abnormal EKG 08/16/2022   Hypomagnesemia 08/16/2022   Toe tendonitis 08/15/2022   Elevated LDL cholesterol level 08/15/2022    Strain of right deltoid muscle 01/11/2022   Neuropathy 10/09/2021   Tingling sensation 09/15/2021   Paroxysmal atrial fibrillation (HCC) 06/15/2021   Senile purpura (HCC) 06/15/2021   Chronic headache 01/01/2019   Essential hypertension 07/09/2018   Osteopenia 11/06/2017   Aphthae 05/28/2015   DD (diverticular disease) 05/28/2015   Fatigue 05/28/2015   Hernia, inguinal, right 05/28/2015   Amnesia 05/28/2015   Congestion-fibrosis syndrome 05/28/2015   Atrophy of vagina 05/28/2015   Vulvar lesion 09/21/2008   Insomnia 12/17/2004    PCP:    Erasmo Downer, MD    REFERRING PROVIDER:    Erasmo Downer, MD    REFERRING DIAG: R07.89 (ICD-10-CM) - Chest wall pain   Rationale for Evaluation and Treatment: Rehabilitation  THERAPY DIAG:  Muscle weakness (generalized)  Acute pain of left shoulder  Pain in left arm  ONSET DATE: 11/08/23  SUBJECTIVE:  SUBJECTIVE STATEMENT: Patient reports improvement in pain following initial evaluation with therapist palpated some of her scapular and parascapular musculature.  Following her last session patient completed exercises at home 3 times and is experiencing significant pain in her bilateral shoulder and upper back region as well as some diffuse pain in her left lower back region.  From eval: Pt first went to hospital the 22 of October for loss of vision in her right eye and was unable to speak clearly. Pt went to the ER and they found it was TIA at that time and she was prescribed statins for her cholesterol. Fast forward Pt was picking up large hanging plants and the next day her chest and shoulder were in significant pain. Pt went back to ED and they thought it was musculoskeletal in nature. Pt instructed to use ice and heat and try PT. Pt has  been trying not to lift anything heavy since.  Last week pt woke up with achiness and soreness in her L arm and went back to MD for this. Pt did not get an ultrasound or anything but did get an EKG and x ray ruling out heart problems.  PERTINENT HISTORY:  AFIB, GERD, TIA  PAIN:  Are you having pain? Yes: NPRS scale: 7 Pain location: L arm Pain description: Ache  Aggravating factors: UE functional use  Relieving factors: rest   PRECAUTIONS: None  RED FLAGS: None   WEIGHT BEARING RESTRICTIONS: No  FALLS:  Has patient fallen in last 6 months? No  LIVING ENVIRONMENT: Lives with: lives alone Lives in: House/apartment Stairs: Yes: External: 1 steps; none Has following equipment at home: None  OCCUPATION: Retired  PLOF: Independent  PATIENT GOALS: Improve shoulder pain, get back to pilates, improve chest pain after exercise, get back to gardening  NEXT MD VISIT: None scheduled  OBJECTIVE:  Note: Objective measures were completed at Evaluation unless otherwise noted.  DIAGNOSTIC FINDINGS:  N/a  PATIENT SURVEYS:  Quick Dash 29.5%  COGNITION: Overall cognitive status: Within functional limits for tasks assessed     SENSATION: Light touch: WFL  POSTURE: rounded shoulders  PALPATION: Tenderness to palpation of left supraspinatus, infraspinatus, and levator scapula muscle.  Cause some discomfort following palpation as well.   CERVICAL ROM:   Active ROM A/PROM (deg) eval  Flexion WNL  Extension WNL  Right lateral flexion Limited  Left lateral flexion Limited  Right rotation   Left rotation    (Blank rows = not tested)  UPPER EXTREMITY ROM:  WNL with exception of left internal rotation that caused pain in earlier palpated areas  UPPER EXTREMITY MMT:  MMT Right eval Left eval  Shoulder flexion 4 4  Shoulder extension    Shoulder abduction 4 4  Shoulder adduction    Shoulder extension    Shoulder internal rotation 4 4  Shoulder external rotation 4 4   Middle trapezius    Lower trapezius    Elbow flexion    Elbow extension    Wrist flexion    Wrist extension    Wrist ulnar deviation    Wrist radial deviation    Wrist pronation    Wrist supination    Grip strength     (Blank rows = not tested)    TREATMENT DATE: 02/18/24 TE- To improve strength, endurance, mobility, and function of specific targeted muscle groups or improve joint range of motion or improve muscle flexibility Initial speaking to patient educating regarding not completing exercises on days if she is having  increased pain in target musculature.  Manual therapy Cervical suboccipital release 3 x 45 sec Cervical TP release on L side x 2-3 min  Prone: periscapular and scapular ischemic TP release x 10 min, large prominend Tps on L scapula ( infraspinatus) causing referred pain into hand  -Instructed patient to apply heat and to rest following these interventions today as she did following initial evaluation  TE- To improve strength, endurance, mobility, and function of specific targeted muscle groups or improve joint range of motion or improve muscle flexibility  2 x R and L UT stretch x 45 sec ea ( to L performed levator stretch due to UT stretch being too painful) 2 x R and L cervical rotation stretch x 45 sec ea    PATIENT EDUCATION:  Education details: Informed patient to not perform exercises on days where she is having increased pain, particularly if it is a soreness that may be a result of the exercises. Person educated: Patient Education method: Explanation Education comprehension: verbalized understanding   HOME EXERCISE PROGRAM: Access Code: [email or web address] URL: [email or web address] Date: 02/12/2024 Prepared by: Thresa Ross  Exercises - Supine Scapular Retraction  - 1 x daily - 3 x weekly - 2 sets - 10 reps - Standing Isometric Shoulder Internal Rotation at Doorway  - 1 x daily - 3 x weekly - 2 sets - 10 reps - Isometric Shoulder  External Rotation at Wall  - 1 x daily - 3 x weekly - 2 sets - 10 reps - Seated Shoulder Rolls  - 1 x daily - 3 x weekly - 2 sets - 10 reps   ASSESSMENT:  CLINICAL IMPRESSION: Patient presents with good motivation for completion of physical therapy activities.  Patient responded poorly to initial set of interventions and home exercises since exercises were brought back to 3 times a week with 1 to 2 days rest between exercise bouts.  This is likely due to patient's large trigger point still present in her scapular and periscapular muscles which were addressed with manual therapy this date.  Will continue to address this in future sessions as indicated and will continue to be slow with addition of exercises and activities to prevent exacerbation of pain again.Pt will continue to benefit from skilled physical therapy intervention to address impairments, improve QOL, and attain therapy goals.    OBJECTIVE IMPAIRMENTS: decreased activity tolerance, decreased strength, and pain.   ACTIVITY LIMITATIONS: carrying, lifting, and reach over head  PARTICIPATION LIMITATIONS: shopping and yard work  PERSONAL FACTORS: 1-2 comorbidities: AFIB, GERD, TIA  are also affecting patient's functional outcome.   REHAB POTENTIAL: Good  CLINICAL DECISION MAKING: Evolving/moderate complexity  EVALUATION COMPLEXITY: Moderate   GOALS: Goals reviewed with patient? Yes  SHORT TERM GOALS: Target date: 02/26/2024      Patient will be independent in home exercise program to improve strength/mobility for better functional independence with ADLs. Baseline: No HEP currently  Goal status: INITIAL   LONG TERM GOALS: Target date: 02/12/2024    1. Patient will increase QuickDASH score by 10% or greater to indicate improved subjective level of function of the left upper extremity  baseline: 29.5% Goal status: INITIAL  2. Patient will improve ER and IR strength on the LUE without reports of pain to indicate  improved functional use of the L UE without pain . Baseline:  See eval  Goal status: INITIAL  3.  Patient will report mild difficulty or less for recreational activities where she put some force or impact through her arm  such as her Pilates activities. Baseline: Unable to at this time Goal status: INITIAL   PLAN:  PT FREQUENCY: 2x/week  PT DURATION: 12 weeks  PLANNED INTERVENTIONS: 97164- PT Re-evaluation, 97110-Therapeutic exercises, 97530- Therapeutic activity, 97112- Neuromuscular re-education, 97535- Self Care, 64403- Manual therapy, Dry Needling, Joint mobilization, and Spinal mobilization.  PLAN FOR NEXT SESSION: Clear C spine, UL neural tension testing, Hep for isometric strength and cervical ROM.    Norman Herrlich, PT 02/18/2024, 8:22 AM

## 2024-02-19 DIAGNOSIS — K120 Recurrent oral aphthae: Secondary | ICD-10-CM | POA: Diagnosis not present

## 2024-02-19 DIAGNOSIS — J018 Other acute sinusitis: Secondary | ICD-10-CM | POA: Diagnosis not present

## 2024-02-20 DIAGNOSIS — L658 Other specified nonscarring hair loss: Secondary | ICD-10-CM | POA: Diagnosis not present

## 2024-02-20 DIAGNOSIS — Z85828 Personal history of other malignant neoplasm of skin: Secondary | ICD-10-CM | POA: Diagnosis not present

## 2024-02-20 DIAGNOSIS — D1801 Hemangioma of skin and subcutaneous tissue: Secondary | ICD-10-CM | POA: Diagnosis not present

## 2024-02-20 DIAGNOSIS — D485 Neoplasm of uncertain behavior of skin: Secondary | ICD-10-CM | POA: Diagnosis not present

## 2024-02-20 DIAGNOSIS — D2372 Other benign neoplasm of skin of left lower limb, including hip: Secondary | ICD-10-CM | POA: Diagnosis not present

## 2024-02-20 DIAGNOSIS — L821 Other seborrheic keratosis: Secondary | ICD-10-CM | POA: Diagnosis not present

## 2024-02-24 ENCOUNTER — Ambulatory Visit: Payer: HMO

## 2024-02-24 DIAGNOSIS — M79602 Pain in left arm: Secondary | ICD-10-CM

## 2024-02-24 DIAGNOSIS — R269 Unspecified abnormalities of gait and mobility: Secondary | ICD-10-CM

## 2024-02-24 DIAGNOSIS — R262 Difficulty in walking, not elsewhere classified: Secondary | ICD-10-CM

## 2024-02-24 DIAGNOSIS — M6281 Muscle weakness (generalized): Secondary | ICD-10-CM | POA: Diagnosis not present

## 2024-02-24 DIAGNOSIS — R2689 Other abnormalities of gait and mobility: Secondary | ICD-10-CM

## 2024-02-24 DIAGNOSIS — R2681 Unsteadiness on feet: Secondary | ICD-10-CM

## 2024-02-24 DIAGNOSIS — M25512 Pain in left shoulder: Secondary | ICD-10-CM

## 2024-02-24 NOTE — Therapy (Signed)
 OUTPATIENT PHYSICAL THERAPY THORACOLUMBAR TREATMENT   Patient Name: Alexis Lyons MRN: [id number] DOB:1941-10-10, 83 y.o., female Today's Date: 02/24/2024  END OF SESSION:  PT End of Session - 02/24/24 1750     Visit Number 4    Number of Visits 24    Date for PT Re-Evaluation 04/22/24    Progress Note Due on Visit 10    PT Start Time 1619    PT Stop Time 1700    PT Time Calculation (min) 41 min    Activity Tolerance Patient tolerated treatment well;Patient limited by pain    Behavior During Therapy [facility] for tasks assessed/performed              Past Medical History:  Diagnosis Date   Amnesia    Aphthae    Atrial fibrillation (HCC)    Congestion-fibrosis syndrome    COVID 12/12/2022   DD (diverticular disease)    GERD (gastroesophageal reflux disease)    Seizures (HCC) 12/2017   passed out due to dehydration   TIA (transient ischemic attack) 10/2023   Past Surgical History:  Procedure Laterality Date   ATRIAL FIBRILLATION ABLATION N/A 11/02/2021   Procedure: ATRIAL FIBRILLATION ABLATION;  Surgeon: Lanier Prude, MD;  Location: [facility] INVASIVE CV LAB;  Service: Cardiovascular;  Laterality: N/A;   CHOLECYSTECTOMY     colonoscopy with polypectomy     8/11 4 mm polyp, 12 mm polyp   COLONOSCOPY WITH PROPOFOL N/A 02/24/2018   Procedure: COLONOSCOPY WITH PROPOFOL;  Surgeon: Scot Jun, MD;  Location: [facility] ENDOSCOPY;  Service: Endoscopy;  Laterality: N/A;   HERNIA REPAIR Right 2004   inguinal    HERNIA REPAIR     x's 3   Patient Active Problem List   Diagnosis Date Noted   History of transient ischemic attack (TIA) 11/08/2023   Acute frontal sinusitis 11/08/2023   Non-seasonal allergic rhinitis 12/20/2022   Headache, rebound 12/20/2022   Hypokalemia 08/16/2022   Hypocalcemia 08/16/2022   Syncope and collapse 08/16/2022   Abnormal EKG 08/16/2022   Hypomagnesemia 08/16/2022   Toe tendonitis 08/15/2022   Elevated LDL cholesterol level 08/15/2022    Strain of right deltoid muscle 01/11/2022   Neuropathy 10/09/2021   Tingling sensation 09/15/2021   Paroxysmal atrial fibrillation (HCC) 06/15/2021   Senile purpura (HCC) 06/15/2021   Chronic headache 01/01/2019   Essential hypertension 07/09/2018   Osteopenia 11/06/2017   Aphthae 05/28/2015   DD (diverticular disease) 05/28/2015   Fatigue 05/28/2015   Hernia, inguinal, right 05/28/2015   Amnesia 05/28/2015   Congestion-fibrosis syndrome 05/28/2015   Atrophy of vagina 05/28/2015   Vulvar lesion 09/21/2008   Insomnia 12/17/2004    PCP:    Erasmo Downer, MD    REFERRING PROVIDER:    Erasmo Downer, MD    REFERRING DIAG: R07.89 (ICD-10-CM) - Chest wall pain   Rationale for Evaluation and Treatment: Rehabilitation  THERAPY DIAG:  Muscle weakness (generalized)  Acute pain of left shoulder  Pain in left arm  Abnormality of gait and mobility  Difficulty in walking, not elsewhere classified  Other abnormalities of gait and mobility  Unsteadiness on feet  ONSET DATE: 11/08/23  SUBJECTIVE:  SUBJECTIVE STATEMENT:  Pt reporting 3/10 pain upon arrival.  Pt notes that she didn't do her exercises over the weekend because she felt so good after the last session.    From eval: Pt first went to hospital the 22 of October for loss of vision in her right eye and was unable to speak clearly. Pt went to the ER and they found it was TIA at that time and she was prescribed statins for her cholesterol. Fast forward Pt was picking up large hanging plants and the next day her chest and shoulder were in significant pain. Pt went back to ED and they thought it was musculoskeletal in nature. Pt instructed to use ice and heat and try PT. Pt has been trying not to lift anything heavy since.  Last  week pt woke up with achiness and soreness in her L arm and went back to MD for this. Pt did not get an ultrasound or anything but did get an EKG and x ray ruling out heart problems.  PERTINENT HISTORY:  AFIB, GERD, TIA  PAIN:  Are you having pain? Yes: NPRS scale: 3 Pain location: L arm Pain description: Ache  Aggravating factors: UE functional use  Relieving factors: rest   PRECAUTIONS: None  RED FLAGS: None   WEIGHT BEARING RESTRICTIONS: No  FALLS:  Has patient fallen in last 6 months? No  LIVING ENVIRONMENT: Lives with: lives alone Lives in: House/apartment Stairs: Yes: External: 1 steps; none Has following equipment at home: None  OCCUPATION: Retired  PLOF: Independent  PATIENT GOALS: Improve shoulder pain, get back to pilates, improve chest pain after exercise, get back to gardening  NEXT MD VISIT: None scheduled  OBJECTIVE:  Note: Objective measures were completed at Evaluation unless otherwise noted.  DIAGNOSTIC FINDINGS:  N/a  PATIENT SURVEYS:  Quick Dash 29.5%  COGNITION: Overall cognitive status: Within functional limits for tasks assessed     SENSATION: Light touch: WFL  POSTURE: rounded shoulders  PALPATION: Tenderness to palpation of left supraspinatus, infraspinatus, and levator scapula muscle.  Cause some discomfort following palpation as well.   CERVICAL ROM:   Active ROM A/PROM (deg) eval  Flexion WNL  Extension WNL  Right lateral flexion Limited  Left lateral flexion Limited  Right rotation   Left rotation    (Blank rows = not tested)  UPPER EXTREMITY ROM:  WNL with exception of left internal rotation that caused pain in earlier palpated areas  UPPER EXTREMITY MMT:  MMT Right eval Left eval  Shoulder flexion 4 4  Shoulder extension    Shoulder abduction 4 4  Shoulder adduction    Shoulder extension    Shoulder internal rotation 4 4  Shoulder external rotation 4 4  Middle trapezius    Lower trapezius    Elbow  flexion    Elbow extension    Wrist flexion    Wrist extension    Wrist ulnar deviation    Wrist radial deviation    Wrist pronation    Wrist supination    Grip strength     (Blank rows = not tested)    TREATMENT DATE: 02/24/24    Manual:  Supine STM to cervical region to increase extensibility of the paraspinals Supine suboccipital release technique to decrease cervicalgia Supine manual traction performed in order to increase joint space in cervical region for pain relief Supine cervical upglides/downglides, 30 sec bouts Supine UT/Levator stretch, 30 sec bouts to increase tissue extensibility of the cervical region Prone STM with TP  release technique applied to the infraspinatus, supraspinatus, rhomboids, and subscapularis  Pt with significant TP in the rhomboid of the L side and noted to have numbness in the L hand when performing release to the region  Instructed patient to apply heat and to rest following these interventions today as she did following initial evaluation     PATIENT EDUCATION:  Education details: Informed patient to not perform exercises on days where she is having increased pain, particularly if it is a soreness that may be a result of the exercises. Person educated: Patient Education method: Explanation Education comprehension: verbalized understanding   HOME EXERCISE PROGRAM: Access Code: [email or web address] URL: [email or web address] Date: 02/12/2024 Prepared by: Thresa Ross  Exercises - Supine Scapular Retraction  - 1 x daily - 3 x weekly - 2 sets - 10 reps - Standing Isometric Shoulder Internal Rotation at Doorway  - 1 x daily - 3 x weekly - 2 sets - 10 reps - Isometric Shoulder External Rotation at Wall  - 1 x daily - 3 x weekly - 2 sets - 10 reps - Seated Shoulder Rolls  - 1 x daily - 3 x weekly - 2 sets - 10 reps   ASSESSMENT:  CLINICAL IMPRESSION:  Pt responded favorably to the manual therapy, noting a reduction in overall  pain, however noted to be sore already from the TP release.  Pt does have considerable TP noted to be present in the L rhomboid region still, and extensive time was spent in order to improve this area.  Pt encouraged to drink plenty of water to reduce the residual soreness likely to occur from manual therapy approach.   Pt will continue to benefit from skilled therapy to address remaining deficits in order to improve overall QoL and return to PLOF.      OBJECTIVE IMPAIRMENTS: decreased activity tolerance, decreased strength, and pain.   ACTIVITY LIMITATIONS: carrying, lifting, and reach over head  PARTICIPATION LIMITATIONS: shopping and yard work  PERSONAL FACTORS: 1-2 comorbidities: AFIB, GERD, TIA  are also affecting patient's functional outcome.   REHAB POTENTIAL: Good  CLINICAL DECISION MAKING: Evolving/moderate complexity  EVALUATION COMPLEXITY: Moderate   GOALS: Goals reviewed with patient? Yes  SHORT TERM GOALS: Target date: 02/26/2024      Patient will be independent in home exercise program to improve strength/mobility for better functional independence with ADLs. Baseline: No HEP currently  Goal status: INITIAL   LONG TERM GOALS: Target date: 02/12/2024    1. Patient will increase QuickDASH score by 10% or greater to indicate improved subjective level of function of the left upper extremity  baseline: 29.5% Goal status: INITIAL  2. Patient will improve ER and IR strength on the LUE without reports of pain to indicate improved functional use of the L UE without pain . Baseline:  See eval  Goal status: INITIAL  3.  Patient will report mild difficulty or less for recreational activities where she put some force or impact through her arm such as her Pilates activities. Baseline: Unable to at this time Goal status: INITIAL   PLAN:  PT FREQUENCY: 2x/week  PT DURATION: 12 weeks  PLANNED INTERVENTIONS: 97164- PT Re-evaluation, 97110-Therapeutic exercises, 97530-  Therapeutic activity, 97112- Neuromuscular re-education, 97535- Self Care, 91478- Manual therapy, Dry Needling, Joint mobilization, and Spinal mobilization.  PLAN FOR NEXT SESSION: Clear C spine, UL neural tension testing, Hep for isometric strength and cervical ROM.     Nolon Bussing, PT, DPT Physical Therapist - [place]  [facility]  Center  02/24/24, 5:55 PM

## 2024-03-02 ENCOUNTER — Ambulatory Visit: Payer: HMO | Admitting: Physical Therapy

## 2024-03-02 DIAGNOSIS — M6281 Muscle weakness (generalized): Secondary | ICD-10-CM | POA: Diagnosis not present

## 2024-03-02 DIAGNOSIS — M79602 Pain in left arm: Secondary | ICD-10-CM

## 2024-03-02 DIAGNOSIS — M25512 Pain in left shoulder: Secondary | ICD-10-CM

## 2024-03-02 NOTE — Therapy (Signed)
 OUTPATIENT PHYSICAL THERAPY THORACOLUMBAR TREATMENT   Patient Name: Alexis Lyons MRN: [id number] DOB:04-07-1941, 83 y.o., female Today's Date: 03/02/2024  END OF SESSION:  PT End of Session - 03/02/24 1611     Visit Number 5    Number of Visits 24    Date for PT Re-Evaluation 04/22/24    Progress Note Due on Visit 10    PT Start Time 1615    PT Stop Time 1650    PT Time Calculation (min) 35 min    Activity Tolerance Patient tolerated treatment well;Patient limited by pain    Behavior During Therapy [facility] for tasks assessed/performed               Past Medical History:  Diagnosis Date   Amnesia    Aphthae    Atrial fibrillation (HCC)    Congestion-fibrosis syndrome    COVID 12/12/2022   DD (diverticular disease)    GERD (gastroesophageal reflux disease)    Seizures (HCC) 12/2017   passed out due to dehydration   TIA (transient ischemic attack) 10/2023   Past Surgical History:  Procedure Laterality Date   ATRIAL FIBRILLATION ABLATION N/A 11/02/2021   Procedure: ATRIAL FIBRILLATION ABLATION;  Surgeon: Lanier Prude, MD;  Location: [facility] INVASIVE CV LAB;  Service: Cardiovascular;  Laterality: N/A;   CHOLECYSTECTOMY     colonoscopy with polypectomy     8/11 4 mm polyp, 12 mm polyp   COLONOSCOPY WITH PROPOFOL N/A 02/24/2018   Procedure: COLONOSCOPY WITH PROPOFOL;  Surgeon: Scot Jun, MD;  Location: [facility] ENDOSCOPY;  Service: Endoscopy;  Laterality: N/A;   HERNIA REPAIR Right 2004   inguinal    HERNIA REPAIR     x's 3   Patient Active Problem List   Diagnosis Date Noted   History of transient ischemic attack (TIA) 11/08/2023   Acute frontal sinusitis 11/08/2023   Non-seasonal allergic rhinitis 12/20/2022   Headache, rebound 12/20/2022   Hypokalemia 08/16/2022   Hypocalcemia 08/16/2022   Syncope and collapse 08/16/2022   Abnormal EKG 08/16/2022   Hypomagnesemia 08/16/2022   Toe tendonitis 08/15/2022   Elevated LDL cholesterol level 08/15/2022    Strain of right deltoid muscle 01/11/2022   Neuropathy 10/09/2021   Tingling sensation 09/15/2021   Paroxysmal atrial fibrillation (HCC) 06/15/2021   Senile purpura (HCC) 06/15/2021   Chronic headache 01/01/2019   Essential hypertension 07/09/2018   Osteopenia 11/06/2017   Aphthae 05/28/2015   DD (diverticular disease) 05/28/2015   Fatigue 05/28/2015   Hernia, inguinal, right 05/28/2015   Amnesia 05/28/2015   Congestion-fibrosis syndrome 05/28/2015   Atrophy of vagina 05/28/2015   Vulvar lesion 09/21/2008   Insomnia 12/17/2004    PCP:    Erasmo Downer, MD    REFERRING PROVIDER:    Erasmo Downer, MD    REFERRING DIAG: R07.89 (ICD-10-CM) - Chest wall pain   Rationale for Evaluation and Treatment: Rehabilitation  THERAPY DIAG:  Muscle weakness (generalized)  Acute pain of left shoulder  Pain in left arm  ONSET DATE: 11/08/23  SUBJECTIVE:  SUBJECTIVE STATEMENT:  Pt reporting no pain upon arrival.  Reports feeling better since last physical therapy session was instructed not to complete exercises until further notice.  From eval: Pt first went to hospital the 22 of October for loss of vision in her right eye and was unable to speak clearly. Pt went to the ER and they found it was TIA at that time and she was prescribed statins for her cholesterol. Fast forward Pt was picking up large hanging plants and the next day her chest and shoulder were in significant pain. Pt went back to ED and they thought it was musculoskeletal in nature. Pt instructed to use ice and heat and try PT. Pt has been trying not to lift anything heavy since.  Last week pt woke up with achiness and soreness in her L arm and went back to MD for this. Pt did not get an ultrasound or anything but did get an EKG  and x ray ruling out heart problems.  PERTINENT HISTORY:  AFIB, GERD, TIA  PAIN:  Are you having pain? Yes: NPRS scale: 3 Pain location: L arm Pain description: Ache  Aggravating factors: UE functional use  Relieving factors: rest   PRECAUTIONS: None  RED FLAGS: None   WEIGHT BEARING RESTRICTIONS: No  FALLS:  Has patient fallen in last 6 months? No  LIVING ENVIRONMENT: Lives with: lives alone Lives in: House/apartment Stairs: Yes: External: 1 steps; none Has following equipment at home: None  OCCUPATION: Retired  PLOF: Independent  PATIENT GOALS: Improve shoulder pain, get back to pilates, improve chest pain after exercise, get back to gardening  NEXT MD VISIT: None scheduled  OBJECTIVE:  Note: Objective measures were completed at Evaluation unless otherwise noted.  DIAGNOSTIC FINDINGS:  N/a  PATIENT SURVEYS:  Quick Dash 29.5%  COGNITION: Overall cognitive status: Within functional limits for tasks assessed     SENSATION: Light touch: WFL  POSTURE: rounded shoulders  PALPATION: Tenderness to palpation of left supraspinatus, infraspinatus, and levator scapula muscle.  Cause some discomfort following palpation as well.   CERVICAL ROM:   Active ROM A/PROM (deg) eval  Flexion WNL  Extension WNL  Right lateral flexion Limited  Left lateral flexion Limited  Right rotation   Left rotation    (Blank rows = not tested)  UPPER EXTREMITY ROM:  WNL with exception of left internal rotation that caused pain in earlier palpated areas  UPPER EXTREMITY MMT:  MMT Right eval Left eval  Shoulder flexion 4 4  Shoulder extension    Shoulder abduction 4 4  Shoulder adduction    Shoulder extension    Shoulder internal rotation 4 4  Shoulder external rotation 4 4  Middle trapezius    Lower trapezius    Elbow flexion    Elbow extension    Wrist flexion    Wrist extension    Wrist ulnar deviation    Wrist radial deviation    Wrist pronation    Wrist  supination    Grip strength     (Blank rows = not tested)    TREATMENT DATE: 03/02/24    Manual:  Supine STM to cervical region to increase extensibility of the paraspinals Supine suboccipital release technique to decrease cervicalgia Supine manual traction performed in order to increase joint space in cervical region for pain relief  Prone STM with TP release technique applied to the infraspinatus, supraspinatus, rhomboids  Pt with significant TP in the rhomboid and infraspinatus of the L side and noted  to have numbness in the L hand when performing release to the region, trigger points in the significant in size and intensity of the session  TE- To improve strength, endurance, mobility, and function of specific targeted muscle groups or improve joint range of motion or improve muscle flexibility  2 x R and L UT stretch x 45 sec ea ( to L performed levator stretch due to UT stretch being too painful) 2 x R and L cervical rotation stretch x 45 sec ea  Instructed patient to apply heat and to rest following these interventions today as she did following initial evaluation  Supine isometric IR, ER and extension ( PT provided resistance) x 10 ea, some discomfort in neck following ER but it ceased with time      PATIENT EDUCATION:  Education details: Informed patient to not perform exercises on days where she is having increased pain, particularly if it is a soreness that may be a result of the exercises. Person educated: Patient Education method: Explanation Education comprehension: verbalized understanding   HOME EXERCISE PROGRAM: Access Code: [email or web address] URL: [email or web address] Date: 02/12/2024 Prepared by: Thresa Ross  Exercises - Supine Scapular Retraction  - 1 x daily - 3 x weekly - 2 sets - 10 reps - Standing Isometric Shoulder Internal Rotation at Doorway  - 1 x daily - 3 x weekly - 2 sets - 10 reps - Isometric Shoulder External Rotation at Wall  -  1 x daily - 3 x weekly - 2 sets - 10 reps - Seated Shoulder Rolls  - 1 x daily - 3 x weekly - 2 sets - 10 reps   ASSESSMENT:  CLINICAL IMPRESSION:  Pt responded favorably to the manual therapy, noting a reduction in overall pain with some soreness from TP release as in past few session.  TP size and sensitivity was improved copmared to last 2 visits showing good indications for progress. Pt encouraged to drink plenty of water to reduce the residual soreness likely to occur from manual therapy approach.  Started with gentle muscle activation exercises but told pt to hold off on completion at home until positive response after in session completion.  Pt will continue to benefit from skilled therapy to address remaining deficits in order to improve overall QoL and return to PLOF.      OBJECTIVE IMPAIRMENTS: decreased activity tolerance, decreased strength, and pain.   ACTIVITY LIMITATIONS: carrying, lifting, and reach over head  PARTICIPATION LIMITATIONS: shopping and yard work  PERSONAL FACTORS: 1-2 comorbidities: AFIB, GERD, TIA  are also affecting patient's functional outcome.   REHAB POTENTIAL: Good  CLINICAL DECISION MAKING: Evolving/moderate complexity  EVALUATION COMPLEXITY: Moderate   GOALS: Goals reviewed with patient? Yes  SHORT TERM GOALS: Target date: 02/26/2024      Patient will be independent in home exercise program to improve strength/mobility for better functional independence with ADLs. Baseline: No HEP currently  Goal status: INITIAL   LONG TERM GOALS: Target date: 02/12/2024    1. Patient will increase QuickDASH score by 10% or greater to indicate improved subjective level of function of the left upper extremity  baseline: 29.5% Goal status: INITIAL  2. Patient will improve ER and IR strength on the LUE without reports of pain to indicate improved functional use of the L UE without pain . Baseline:  See eval  Goal status: INITIAL  3.  Patient will  report mild difficulty or less for recreational activities where she put some force or impact through her arm such  as her Pilates activities. Baseline: Unable to at this time Goal status: INITIAL   PLAN:  PT FREQUENCY: 2x/week  PT DURATION: 12 weeks  PLANNED INTERVENTIONS: 97164- PT Re-evaluation, 97110-Therapeutic exercises, 97530- Therapeutic activity, 97112- Neuromuscular re-education, 97535- Self Care, 44034- Manual therapy, Dry Needling, Joint mobilization, and Spinal mobilization.  PLAN FOR NEXT SESSION: Clear C spine, UL neural tension testing, Hep for isometric strength and cervical ROM.     Norman Herrlich PT ,DPT Physical Therapist- [facility]  03/02/24, 5:01 PM

## 2024-03-04 ENCOUNTER — Other Ambulatory Visit: Payer: Self-pay

## 2024-03-04 DIAGNOSIS — I480 Paroxysmal atrial fibrillation: Secondary | ICD-10-CM

## 2024-03-04 MED ORDER — APIXABAN 2.5 MG PO TABS
2.5000 mg | ORAL_TABLET | Freq: Two times a day (BID) | ORAL | 1 refills | Status: DC
Start: 2024-03-04 — End: 2024-08-27

## 2024-03-04 NOTE — Telephone Encounter (Signed)
 Prescription refill request for Eliquis received. Indication: Afib  Last office visit: 11/21/23 ([place])  Scr: 0.76 (01/20/24)  Age: 83 Weight: 56.9kg  Appropriate dose. Refill sent.

## 2024-03-09 ENCOUNTER — Other Ambulatory Visit: Payer: Self-pay | Admitting: Family Medicine

## 2024-03-09 ENCOUNTER — Ambulatory Visit: Payer: HMO | Admitting: Physical Therapy

## 2024-03-09 DIAGNOSIS — R2681 Unsteadiness on feet: Secondary | ICD-10-CM

## 2024-03-09 DIAGNOSIS — M6281 Muscle weakness (generalized): Secondary | ICD-10-CM

## 2024-03-09 DIAGNOSIS — R2689 Other abnormalities of gait and mobility: Secondary | ICD-10-CM

## 2024-03-09 DIAGNOSIS — R262 Difficulty in walking, not elsewhere classified: Secondary | ICD-10-CM

## 2024-03-09 DIAGNOSIS — M79602 Pain in left arm: Secondary | ICD-10-CM

## 2024-03-09 DIAGNOSIS — R269 Unspecified abnormalities of gait and mobility: Secondary | ICD-10-CM

## 2024-03-09 DIAGNOSIS — M25512 Pain in left shoulder: Secondary | ICD-10-CM

## 2024-03-09 NOTE — Therapy (Unsigned)
 OUTPATIENT PHYSICAL THERAPY THORACOLUMBAR TREATMENT   Patient Name: Alexis Lyons MRN: [id number] DOB:03-15-1941, 83 y.o., female Today's Date: 03/09/2024  END OF SESSION:  PT End of Session - 03/09/24 1617     Visit Number 6    Number of Visits 24    Date for PT Re-Evaluation 04/22/24    Progress Note Due on Visit 10    PT Start Time 1617    PT Stop Time 1700    PT Time Calculation (min) 43 min    Activity Tolerance Patient tolerated treatment well;Patient limited by pain    Behavior During Therapy [facility] for tasks assessed/performed               Past Medical History:  Diagnosis Date   Amnesia    Aphthae    Atrial fibrillation (HCC)    Congestion-fibrosis syndrome    COVID 12/12/2022   DD (diverticular disease)    GERD (gastroesophageal reflux disease)    Seizures (HCC) 12/2017   passed out due to dehydration   TIA (transient ischemic attack) 10/2023   Past Surgical History:  Procedure Laterality Date   ATRIAL FIBRILLATION ABLATION N/A 11/02/2021   Procedure: ATRIAL FIBRILLATION ABLATION;  Surgeon: Lanier Prude, MD;  Location: [facility] INVASIVE CV LAB;  Service: Cardiovascular;  Laterality: N/A;   CHOLECYSTECTOMY     colonoscopy with polypectomy     8/11 4 mm polyp, 12 mm polyp   COLONOSCOPY WITH PROPOFOL N/A 02/24/2018   Procedure: COLONOSCOPY WITH PROPOFOL;  Surgeon: Scot Jun, MD;  Location: [facility] ENDOSCOPY;  Service: Endoscopy;  Laterality: N/A;   HERNIA REPAIR Right 2004   inguinal    HERNIA REPAIR     x's 3   Patient Active Problem List   Diagnosis Date Noted   History of transient ischemic attack (TIA) 11/08/2023   Acute frontal sinusitis 11/08/2023   Non-seasonal allergic rhinitis 12/20/2022   Headache, rebound 12/20/2022   Hypokalemia 08/16/2022   Hypocalcemia 08/16/2022   Syncope and collapse 08/16/2022   Abnormal EKG 08/16/2022   Hypomagnesemia 08/16/2022   Toe tendonitis 08/15/2022   Elevated LDL cholesterol level 08/15/2022    Strain of right deltoid muscle 01/11/2022   Neuropathy 10/09/2021   Tingling sensation 09/15/2021   Paroxysmal atrial fibrillation (HCC) 06/15/2021   Senile purpura (HCC) 06/15/2021   Chronic headache 01/01/2019   Essential hypertension 07/09/2018   Osteopenia 11/06/2017   Aphthae 05/28/2015   DD (diverticular disease) 05/28/2015   Fatigue 05/28/2015   Hernia, inguinal, right 05/28/2015   Amnesia 05/28/2015   Congestion-fibrosis syndrome 05/28/2015   Atrophy of vagina 05/28/2015   Vulvar lesion 09/21/2008   Insomnia 12/17/2004    PCP:    Erasmo Downer, MD    REFERRING PROVIDER:    Erasmo Downer, MD    REFERRING DIAG: R07.89 (ICD-10-CM) - Chest wall pain   Rationale for Evaluation and Treatment: Rehabilitation  THERAPY DIAG:  Muscle weakness (generalized)  Acute pain of left shoulder  Pain in left arm  Abnormality of gait and mobility  Difficulty in walking, not elsewhere classified  Other abnormalities of gait and mobility  Unsteadiness on feet  ONSET DATE: 11/08/23  SUBJECTIVE:  SUBJECTIVE STATEMENT:  Pt reporting no pain upon arrival.  Mild pain in the L shoulder blade when trying to open window with the RUE. Was able to ice and rest and the pain ceased after 2 days.  Reports feeling better since last physical therapy session was instructed not to complete exercises until further notice.  From eval: Pt first went to hospital the 22 of October for loss of vision in her right eye and was unable to speak clearly. Pt went to the ER and they found it was TIA at that time and she was prescribed statins for her cholesterol. Fast forward Pt was picking up large hanging plants and the next day her chest and shoulder were in significant pain. Pt went back to ED and they  thought it was musculoskeletal in nature. Pt instructed to use ice and heat and try PT. Pt has been trying not to lift anything heavy since.  Last week pt woke up with achiness and soreness in her L arm and went back to MD for this. Pt did not get an ultrasound or anything but did get an EKG and x ray ruling out heart problems.  PERTINENT HISTORY:  AFIB, GERD, TIA  PAIN:  Are you having pain? Yes: NPRS scale: 3 Pain location: L arm Pain description: Ache  Aggravating factors: UE functional use  Relieving factors: rest   PRECAUTIONS: None  RED FLAGS: None   WEIGHT BEARING RESTRICTIONS: No  FALLS:  Has patient fallen in last 6 months? No  LIVING ENVIRONMENT: Lives with: lives alone Lives in: House/apartment Stairs: Yes: External: 1 steps; none Has following equipment at home: None  OCCUPATION: Retired  PLOF: Independent  PATIENT GOALS: Improve shoulder pain, get back to pilates, improve chest pain after exercise, get back to gardening  NEXT MD VISIT: None scheduled  OBJECTIVE:  Note: Objective measures were completed at Evaluation unless otherwise noted.  DIAGNOSTIC FINDINGS:  N/a  PATIENT SURVEYS:  Quick Dash 29.5%  COGNITION: Overall cognitive status: Within functional limits for tasks assessed     SENSATION: Light touch: WFL  POSTURE: rounded shoulders  PALPATION: Tenderness to palpation of left supraspinatus, infraspinatus, and levator scapula muscle.  Cause some discomfort following palpation as well.   CERVICAL ROM:   Active ROM A/PROM (deg) eval  Flexion WNL  Extension WNL  Right lateral flexion Limited  Left lateral flexion Limited  Right rotation   Left rotation    (Blank rows = not tested)  UPPER EXTREMITY ROM:  WNL with exception of left internal rotation that caused pain in earlier palpated areas  UPPER EXTREMITY MMT:  MMT Right eval Left eval  Shoulder flexion 4 4  Shoulder extension    Shoulder abduction 4 4  Shoulder  adduction    Shoulder extension    Shoulder internal rotation 4 4  Shoulder external rotation 4 4  Middle trapezius    Lower trapezius    Elbow flexion    Elbow extension    Wrist flexion    Wrist extension    Wrist ulnar deviation    Wrist radial deviation    Wrist pronation    Wrist supination    Grip strength     (Blank rows = not tested)    TREATMENT DATE: 03/09/24   Manual:  Supine STM to cervical region to increase extensibility of the paraspinals Supine suboccipital release technique to decrease cervicalgia Supine manual traction performed in order to increase joint space in cervical region for pain relief  Prone STM with TP release technique applied to the infraspinatus, supraspinatus, rhomboids  Pt with significant TP in the rhomboid and infraspinatus of the L side and noted to have numbness in the L hand when performing release to the region, trigger points in the significant in size and intensity of the session  TE- To improve strength, endurance, mobility, and function of specific targeted muscle groups or improve joint range of motion or improve muscle flexibility  2 x R and L UT stretch x 45 sec ea ( to L performed levator stretch due to UT stretch being too painful) 2 x R and L cervical rotation stretch x 45 sec ea  Instructed patient to apply heat and to rest following these interventions today as she did following initial evaluation  Supine isometric IR, ER and extension ( PT provided resistance) x 10 ea, some discomfort in neck following ER but it ceased with time      PATIENT EDUCATION:  Education details: Informed patient to not perform exercises on days where she is having increased pain, particularly if it is a soreness that may be a result of the exercises. Person educated: Patient Education method: Explanation Education comprehension: verbalized understanding   HOME EXERCISE PROGRAM: Access Code: [email or web address] URL:  [email or web address] Date: 02/12/2024 Prepared by: Thresa Ross  Exercises - Supine Scapular Retraction  - 1 x daily - 3 x weekly - 2 sets - 10 reps - Standing Isometric Shoulder Internal Rotation at Doorway  - 1 x daily - 3 x weekly - 2 sets - 10 reps - Isometric Shoulder External Rotation at Wall  - 1 x daily - 3 x weekly - 2 sets - 10 reps - Seated Shoulder Rolls  - 1 x daily - 3 x weekly - 2 sets - 10 reps   ASSESSMENT:  CLINICAL IMPRESSION:  Pt responded favorably to the manual therapy, noting a reduction in overall pain with some soreness from TP release as in past few session.  TP size and sensitivity was improved copmared to last 2 visits showing good indications for progress. Pt encouraged to drink plenty of water to reduce the residual soreness likely to occur from manual therapy approach.  Started with gentle muscle activation exercises but told pt to hold off on completion at home until positive response after in session completion.  Pt will continue to benefit from skilled therapy to address remaining deficits in order to improve overall QoL and return to PLOF.      OBJECTIVE IMPAIRMENTS: decreased activity tolerance, decreased strength, and pain.   ACTIVITY LIMITATIONS: carrying, lifting, and reach over head  PARTICIPATION LIMITATIONS: shopping and yard work  PERSONAL FACTORS: 1-2 comorbidities: AFIB, GERD, TIA  are also affecting patient's functional outcome.   REHAB POTENTIAL: Good  CLINICAL DECISION MAKING: Evolving/moderate complexity  EVALUATION COMPLEXITY: Moderate   GOALS: Goals reviewed with patient? Yes  SHORT TERM GOALS: Target date: 02/26/2024      Patient will be independent in home exercise program to improve strength/mobility for better functional independence with ADLs. Baseline: No HEP currently  Goal status: INITIAL   LONG TERM GOALS: Target date: 02/12/2024    1. Patient will increase QuickDASH score by 10% or greater  to indicate improved subjective level of function of the left upper extremity  baseline: 29.5% Goal status: INITIAL  2. Patient will improve ER and IR strength on the LUE without reports of pain to indicate improved functional use of the L UE without pain . Baseline:  See eval  Goal status: INITIAL  3.  Patient will report mild difficulty or less for recreational activities where she put some force or impact through her arm such as her Pilates activities. Baseline: Unable to at this time Goal status: INITIAL   PLAN:  PT FREQUENCY: 2x/week  PT DURATION: 12 weeks  PLANNED INTERVENTIONS: 97164- PT Re-evaluation, 97110-Therapeutic exercises, 97530- Therapeutic activity, 97112- Neuromuscular re-education, 97535- Self Care, 65784- Manual therapy, Dry Needling, Joint mobilization, and Spinal mobilization.  PLAN FOR NEXT SESSION: Clear C spine, UL neural tension testing, Hep for isometric strength and cervical ROM.     Golden Pop PT ,DPT Physical Therapist- [facility]  03/09/24, 4:18 PM

## 2024-03-09 NOTE — Telephone Encounter (Signed)
 Copied from CRM [id number]. Topic: Clinical - Medication Refill >> Mar 09, 2024 11:04 AM Higinio Roger wrote: Most Recent Primary Care Visit:  Provider: Erasmo Downer  Department: [facility] PRACT[facility]  Visit Type: OFFICE VISIT  Date: 12/24/2023  Medication: zolpidem (AMBIEN) 5 MG tablet   Has the patient contacted their pharmacy? Yes (Agent: If no, request that the patient contact the pharmacy for the refill. If patient does not wish to contact the pharmacy document the reason why and proceed with request.) (Agent: If yes, when and what did the pharmacy advise?)  Is this the correct pharmacy for this prescription? Yes If no, delete pharmacy and type the correct one.  This is the patient's preferred pharmacy:   [id number]oles Rough [facility] - [address] [place] [address] Phone: [phone] Fax: [phone]  Has the prescription been filled recently? Yes  Is the patient out of the medication? Yes  Has the patient been seen for an appointment in the last year OR does the patient have an upcoming appointment? Yes  Can we respond through MyChart? Yes  Agent: Please be advised that Rx refills may take up to 3 business days. We ask that you follow-up with your pharmacy.

## 2024-03-10 MED ORDER — ZOLPIDEM TARTRATE 5 MG PO TABS: 2.5000 mg | ORAL_TABLET | Freq: Every evening | ORAL | 0 refills | Status: DC | PRN

## 2024-03-10 NOTE — Telephone Encounter (Signed)
 Requested medication (s) are due for refill today: Yes  Requested medication (s) are on the active medication list: Yes  Last refill:  12/23/23 #15, 0 refills   Future visit scheduled: Yes  Notes to clinic:  Unable to refill per protocol, cannot delegate.      Requested Prescriptions  Pending Prescriptions Disp Refills   zolpidem (AMBIEN) 5 MG tablet 15 tablet 0     Not Delegated - Psychiatry:  Anxiolytics/Hypnotics Failed - 03/10/2024  4:07 PM      Failed - This refill cannot be delegated      Failed - Urine Drug Screen completed in last 360 days      Passed - Valid encounter within last 6 months    Recent Outpatient Visits           2 months ago Elevated LDL cholesterol level   [place] [facility] [place], Marzella Schlein, MD   3 months ago Recurrent sinusitis   [place] [facility] [place], [place], PA-C   4 months ago Acute recurrent frontal sinusitis   [place] [facility] Malva Limes, MD   4 months ago Other subacute sinusitis   [place] [facility] [place], [place], PA-C   4 months ago Non-seasonal allergic rhinitis due to other allergic trigger   [place] [facility] [place], [place], PA-C       Future Appointments             In 1 month [place], Marzella Schlein, MD [facility], [facility]

## 2024-03-11 ENCOUNTER — Ambulatory Visit: Payer: HMO | Admitting: Physical Therapy

## 2024-03-16 ENCOUNTER — Ambulatory Visit: Payer: HMO | Admitting: Physical Therapy

## 2024-03-16 DIAGNOSIS — R262 Difficulty in walking, not elsewhere classified: Secondary | ICD-10-CM

## 2024-03-16 DIAGNOSIS — M6281 Muscle weakness (generalized): Secondary | ICD-10-CM

## 2024-03-16 DIAGNOSIS — R269 Unspecified abnormalities of gait and mobility: Secondary | ICD-10-CM

## 2024-03-16 DIAGNOSIS — M79602 Pain in left arm: Secondary | ICD-10-CM

## 2024-03-16 DIAGNOSIS — M25512 Pain in left shoulder: Secondary | ICD-10-CM

## 2024-03-16 DIAGNOSIS — R2689 Other abnormalities of gait and mobility: Secondary | ICD-10-CM

## 2024-03-16 DIAGNOSIS — R2681 Unsteadiness on feet: Secondary | ICD-10-CM

## 2024-03-16 NOTE — Therapy (Signed)
 OUTPATIENT PHYSICAL THERAPY THORACOLUMBAR TREATMENT   Patient Name: Alexis Lyons MRN: [id number] DOB:[birth year], 83 y.o., female Today's Date: 03/16/2024  END OF SESSION:  PT End of Session - 03/16/24 1617     Visit Number 7    Number of Visits 24    Date for PT Re-Evaluation 04/22/24    Progress Note Due on Visit 10    PT Start Time 1618    PT Stop Time 1700    PT Time Calculation (min) 42 min    Activity Tolerance Patient tolerated treatment well;Patient limited by pain    Behavior During Therapy [facility] for tasks assessed/performed               Past Medical History:  Diagnosis Date   Amnesia    Aphthae    Atrial fibrillation (HCC)    Congestion-fibrosis syndrome    COVID 12/12/2022   DD (diverticular disease)    GERD (gastroesophageal reflux disease)    Seizures (HCC) 12/2017   passed out due to dehydration   TIA (transient ischemic attack) 10/2023   Past Surgical History:  Procedure Laterality Date   ATRIAL FIBRILLATION ABLATION N/A 11/02/2021   Procedure: ATRIAL FIBRILLATION ABLATION;  Surgeon: Lanier Prude, MD;  Location: [facility] INVASIVE CV LAB;  Service: Cardiovascular;  Laterality: N/A;   CHOLECYSTECTOMY     colonoscopy with polypectomy     8/11 4 mm polyp, 12 mm polyp   COLONOSCOPY WITH PROPOFOL N/A 02/24/2018   Procedure: COLONOSCOPY WITH PROPOFOL;  Surgeon: Scot Jun, MD;  Location: [facility] ENDOSCOPY;  Service: Endoscopy;  Laterality: N/A;   HERNIA REPAIR Right 2004   inguinal    HERNIA REPAIR     x's 3   Patient Active Problem List   Diagnosis Date Noted   History of transient ischemic attack (TIA) 11/08/2023   Acute frontal sinusitis 11/08/2023   Non-seasonal allergic rhinitis 12/20/2022   Headache, rebound 12/20/2022   Hypokalemia 08/16/2022   Hypocalcemia 08/16/2022   Syncope and collapse 08/16/2022   Abnormal EKG 08/16/2022   Hypomagnesemia 08/16/2022   Toe tendonitis 08/15/2022   Elevated LDL cholesterol level 08/15/2022    Strain of right deltoid muscle 01/11/2022   Neuropathy 10/09/2021   Tingling sensation 09/15/2021   Paroxysmal atrial fibrillation (HCC) 06/15/2021   Senile purpura (HCC) 06/15/2021   Chronic headache 01/01/2019   Essential hypertension 07/09/2018   Osteopenia 11/06/2017   Aphthae 05/28/2015   DD (diverticular disease) 05/28/2015   Fatigue 05/28/2015   Hernia, inguinal, right 05/28/2015   Amnesia 05/28/2015   Congestion-fibrosis syndrome 05/28/2015   Atrophy of vagina 05/28/2015   Vulvar lesion 09/21/2008   Insomnia 12/17/2004    PCP:    Erasmo Downer, MD    REFERRING PROVIDER:    Erasmo Downer, MD    REFERRING DIAG: R07.89 (ICD-10-CM) - Chest wall pain   Rationale for Evaluation and Treatment: Rehabilitation  THERAPY DIAG:  Muscle weakness (generalized)  Acute pain of left shoulder  Other abnormalities of gait and mobility  Pain in left arm  Abnormality of gait and mobility  Difficulty in walking, not elsewhere classified  Unsteadiness on feet  ONSET DATE: 11/08/23  SUBJECTIVE:  SUBJECTIVE STATEMENT:  Pt reports that he shoulder is bothering her a little more on this day. States that she went to pick up a gallon of water then had soreness in the muscle. No sharp shooting pain in the shoulder since pain started. No pain at start of PT treatment.   Pt also states that she had an episode of severe dizziness/lightheadedness over the weekend. EMS was called. BP was assessed slightly elevated and then had EKG with normal results per pt. No other issues since then.    From eval: Pt first went to hospital the 22 of October for loss of vision in her right eye and was unable to speak clearly. Pt went to the ER and they found it was TIA at that time and she was prescribed  statins for her cholesterol. Fast forward Pt was picking up large hanging plants and the next day her chest and shoulder were in significant pain. Pt went back to ED and they thought it was musculoskeletal in nature. Pt instructed to use ice and heat and try PT. Pt has been trying not to lift anything heavy since.  Last week pt woke up with achiness and soreness in her L arm and went back to MD for this. Pt did not get an ultrasound or anything but did get an EKG and x ray ruling out heart problems.  PERTINENT HISTORY:  AFIB, GERD, TIA  PAIN:  Are you having pain? Yes: NPRS scale: 3 Pain location: L arm Pain description: Ache  Aggravating factors: UE functional use  Relieving factors: rest   PRECAUTIONS: None  RED FLAGS: None   WEIGHT BEARING RESTRICTIONS: No  FALLS:  Has patient fallen in last 6 months? No  LIVING ENVIRONMENT: Lives with: lives alone Lives in: House/apartment Stairs: Yes: External: 1 steps; none Has following equipment at home: None  OCCUPATION: Retired  PLOF: Independent  PATIENT GOALS: Improve shoulder pain, get back to pilates, improve chest pain after exercise, get back to gardening  NEXT MD VISIT: None scheduled  OBJECTIVE:  Note: Objective measures were completed at Evaluation unless otherwise noted.  DIAGNOSTIC FINDINGS:  N/a  PATIENT SURVEYS:  Quick Dash 29.5%  COGNITION: Overall cognitive status: Within functional limits for tasks assessed     SENSATION: Light touch: WFL  POSTURE: rounded shoulders  PALPATION: Tenderness to palpation of left supraspinatus, infraspinatus, and levator scapula muscle.  Cause some discomfort following palpation as well.   CERVICAL ROM:   Active ROM A/PROM (deg) eval  Flexion WNL  Extension WNL  Right lateral flexion Limited  Left lateral flexion Limited  Right rotation   Left rotation    (Blank rows = not tested)  UPPER EXTREMITY ROM:  WNL with exception of left internal rotation that  caused pain in earlier palpated areas  UPPER EXTREMITY MMT:  MMT Right eval Left eval  Shoulder flexion 4 4  Shoulder extension    Shoulder abduction 4 4  Shoulder adduction    Shoulder extension    Shoulder internal rotation 4 4  Shoulder external rotation 4 4  Middle trapezius    Lower trapezius    Elbow flexion    Elbow extension    Wrist flexion    Wrist extension    Wrist ulnar deviation    Wrist radial deviation    Wrist pronation    Wrist supination    Grip strength     (Blank rows = not tested)    TREATMENT DATE: 03/16/24  Supine: 141/70 HR  61 Sitting: 137/78 HR 50  Standing 0 min: 169/82 HR 55.  Standing 1 min: 149/81 HR 58  No S/S.     Manual:  Supine STM to cervical region to increase extensibility of the paraspinals. Supine suboccipital release technique to decrease cervicalgia  UT stretch with over pressure at [facility] joint 2 x 45 sec bil  Cervical rotation R and L with overpressure x 30sec bil  Seated STM to Levator and Rhomboids x 3 min with TP release.   Supine therex;  TE- To improve strength, endurance, mobility, and function of specific targeted muscle groups or improve joint range of motion or improve muscle flexibility Supine isometric shoulder extension and abduction ,  ( PT provided resistance) 2x 10 ea. No pain on this day ER with YTB x 10  ER activation with shoulder flexion to 90 deg x 8  Shoulder flexion in pain free range to ~ 160 deg  x 10 mild tightness in UT.  Seated low row x 10 with YTB  AROM shoulder retraction x 10.   Pt reports no increased in pain throughout session, but mild soreness at sight of TP release.   PATIENT EDUCATION:  Education details: Pt educated throughout session about proper posture and technique with exercises. Improved exercise technique, movement at target joints, use of target muscles after min to mod verbal, visual, tactile cues.  Person educated: Patient Education method: Explanation Education  comprehension: verbalized understanding   HOME EXERCISE PROGRAM: Access Code: [email or web address] URL: [email or web address] Date: 02/12/2024 Prepared by: Thresa Ross  Exercises - Supine Scapular Retraction  - 1 x daily - 3 x weekly - 2 sets - 10 reps - Standing Isometric Shoulder Internal Rotation at Doorway  - 1 x daily - 3 x weekly - 2 sets - 10 reps - Isometric Shoulder External Rotation at Wall  - 1 x daily - 3 x weekly - 2 sets - 10 reps - Seated Shoulder Rolls  - 1 x daily - 3 x weekly - 2 sets - 10 reps   ASSESSMENT:  CLINICAL IMPRESSION:  Pt responded favorably to the manual therapy, noting a reduction in overall pain with some soreness from TP release as in past few session. Increased active ROM and resistance added to program on this day without  increased pain reported by Pt. Will continue to monitor and increase as appropriate. Pt will continue to benefit from skilled therapy to address remaining deficits in order to improve overall QoL and return to PLOF.      OBJECTIVE IMPAIRMENTS: decreased activity tolerance, decreased strength, and pain.   ACTIVITY LIMITATIONS: carrying, lifting, and reach over head  PARTICIPATION LIMITATIONS: shopping and yard work  PERSONAL FACTORS: 1-2 comorbidities: AFIB, GERD, TIA  are also affecting patient's functional outcome.   REHAB POTENTIAL: Good  CLINICAL DECISION MAKING: Evolving/moderate complexity  EVALUATION COMPLEXITY: Moderate   GOALS: Goals reviewed with patient? Yes  SHORT TERM GOALS: Target date: 02/26/2024      Patient will be independent in home exercise program to improve strength/mobility for better functional independence with ADLs. Baseline: No HEP currently  Goal status: INITIAL   LONG TERM GOALS: Target date: 02/12/2024    1. Patient will increase QuickDASH score by 10% or greater to indicate improved subjective level of function of the left upper extremity  baseline: 29.5% Goal status:  INITIAL  2. Patient will improve ER and IR strength on the LUE without reports of pain to indicate improved functional use of the L UE without pain .  Baseline:  See eval  Goal status: INITIAL  3.  Patient will report mild difficulty or less for recreational activities where she put some force or impact through her arm such as her Pilates activities. Baseline: Unable to at this time Goal status: INITIAL   PLAN:  PT FREQUENCY: 2x/week  PT DURATION: 12 weeks  PLANNED INTERVENTIONS: 97164- PT Re-evaluation, 97110-Therapeutic exercises, 97530- Therapeutic activity, 97112- Neuromuscular re-education, 97535- Self Care, [id number]- Manual therapy, Dry Needling, Joint mobilization, and Spinal mobilization.  PLAN FOR NEXT SESSION:  Increase Hep for isometric strength and cervical ROM.  Manual for pain management.    Golden Pop PT ,DPT Physical Therapist- [facility]  03/16/24, 4:17 PM

## 2024-03-18 ENCOUNTER — Ambulatory Visit: Payer: HMO | Admitting: Physical Therapy

## 2024-03-18 DIAGNOSIS — K120 Recurrent oral aphthae: Secondary | ICD-10-CM | POA: Diagnosis not present

## 2024-03-18 DIAGNOSIS — J018 Other acute sinusitis: Secondary | ICD-10-CM | POA: Diagnosis not present

## 2024-03-23 ENCOUNTER — Ambulatory Visit: Payer: HMO | Attending: Family Medicine | Admitting: Physical Therapy

## 2024-03-23 DIAGNOSIS — M79602 Pain in left arm: Secondary | ICD-10-CM | POA: Insufficient documentation

## 2024-03-23 DIAGNOSIS — R262 Difficulty in walking, not elsewhere classified: Secondary | ICD-10-CM | POA: Insufficient documentation

## 2024-03-23 DIAGNOSIS — M25512 Pain in left shoulder: Secondary | ICD-10-CM | POA: Diagnosis not present

## 2024-03-23 DIAGNOSIS — R2689 Other abnormalities of gait and mobility: Secondary | ICD-10-CM | POA: Insufficient documentation

## 2024-03-23 DIAGNOSIS — R2681 Unsteadiness on feet: Secondary | ICD-10-CM | POA: Insufficient documentation

## 2024-03-23 DIAGNOSIS — R269 Unspecified abnormalities of gait and mobility: Secondary | ICD-10-CM | POA: Diagnosis not present

## 2024-03-23 DIAGNOSIS — M6281 Muscle weakness (generalized): Secondary | ICD-10-CM | POA: Diagnosis not present

## 2024-03-23 NOTE — Therapy (Unsigned)
 OUTPATIENT PHYSICAL THERAPY THORACOLUMBAR TREATMENT   Patient Name: Alexis Lyons MRN: [id number] DOB:January 12, 1941, 83 y.o., female Today's Date: 03/23/2024  END OF SESSION:  PT End of Session - 03/23/24 1620     Visit Number 8    Number of Visits 24    Date for PT Re-Evaluation 04/22/24    Progress Note Due on Visit 10    PT Start Time 1620    PT Stop Time 1700    PT Time Calculation (min) 40 min    Activity Tolerance Patient tolerated treatment well;Patient limited by pain    Behavior During Therapy [facility] for tasks assessed/performed               Past Medical History:  Diagnosis Date   Amnesia    Aphthae    Atrial fibrillation (HCC)    Congestion-fibrosis syndrome    COVID 12/12/2022   DD (diverticular disease)    GERD (gastroesophageal reflux disease)    Seizures (HCC) 12/2017   passed out due to dehydration   TIA (transient ischemic attack) 10/2023   Past Surgical History:  Procedure Laterality Date   ATRIAL FIBRILLATION ABLATION N/A 11/02/2021   Procedure: ATRIAL FIBRILLATION ABLATION;  Surgeon: Lanier Prude, MD;  Location: [facility] INVASIVE CV LAB;  Service: Cardiovascular;  Laterality: N/A;   CHOLECYSTECTOMY     colonoscopy with polypectomy     8/11 4 mm polyp, 12 mm polyp   COLONOSCOPY WITH PROPOFOL N/A 02/24/2018   Procedure: COLONOSCOPY WITH PROPOFOL;  Surgeon: Scot Jun, MD;  Location: [facility] ENDOSCOPY;  Service: Endoscopy;  Laterality: N/A;   HERNIA REPAIR Right 2004   inguinal    HERNIA REPAIR     x's 3   Patient Active Problem List   Diagnosis Date Noted   History of transient ischemic attack (TIA) 11/08/2023   Acute frontal sinusitis 11/08/2023   Non-seasonal allergic rhinitis 12/20/2022   Headache, rebound 12/20/2022   Hypokalemia 08/16/2022   Hypocalcemia 08/16/2022   Syncope and collapse 08/16/2022   Abnormal EKG 08/16/2022   Hypomagnesemia 08/16/2022   Toe tendonitis 08/15/2022   Elevated LDL cholesterol level 08/15/2022    Strain of right deltoid muscle 01/11/2022   Neuropathy 10/09/2021   Tingling sensation 09/15/2021   Paroxysmal atrial fibrillation (HCC) 06/15/2021   Senile purpura (HCC) 06/15/2021   Chronic headache 01/01/2019   Essential hypertension 07/09/2018   Osteopenia 11/06/2017   Aphthae 05/28/2015   DD (diverticular disease) 05/28/2015   Fatigue 05/28/2015   Hernia, inguinal, right 05/28/2015   Amnesia 05/28/2015   Congestion-fibrosis syndrome 05/28/2015   Atrophy of vagina 05/28/2015   Vulvar lesion 09/21/2008   Insomnia 12/17/2004    PCP:    Erasmo Downer, MD    REFERRING PROVIDER:    Erasmo Downer, MD    REFERRING DIAG: R07.89 (ICD-10-CM) - Chest wall pain   Rationale for Evaluation and Treatment: Rehabilitation  THERAPY DIAG:  Muscle weakness (generalized)  Acute pain of left shoulder  Pain in left arm  Abnormality of gait and mobility  Difficulty in walking, not elsewhere classified  Other abnormalities of gait and mobility  Unsteadiness on feet  ONSET DATE: 11/08/23  SUBJECTIVE:  SUBJECTIVE STATEMENT:  Pt reports that she went to birthday party and brunch over the weekend and was able to trim some bushes with electric hedge trimmer. No pain reported in shoulder, but a little soreness L glutes. Wants to restart pilates soon.     From eval: Pt first went to hospital the 22 of October for loss of vision in her right eye and was unable to speak clearly. Pt went to the ER and they found it was TIA at that time and she was prescribed statins for her cholesterol. Fast forward Pt was picking up large hanging plants and the next day her chest and shoulder were in significant pain. Pt went back to ED and they thought it was musculoskeletal in nature. Pt instructed to use  ice and heat and try PT. Pt has been trying not to lift anything heavy since.  Last week pt woke up with achiness and soreness in her L arm and went back to MD for this. Pt did not get an ultrasound or anything but did get an EKG and x ray ruling out heart problems.  PERTINENT HISTORY:  AFIB, GERD, TIA  PAIN:  Are you having pain? Yes: NPRS scale: 3 Pain location: L arm Pain description: Ache  Aggravating factors: UE functional use  Relieving factors: rest   PRECAUTIONS: None  RED FLAGS: None   WEIGHT BEARING RESTRICTIONS: No  FALLS:  Has patient fallen in last 6 months? No  LIVING ENVIRONMENT: Lives with: lives alone Lives in: House/apartment Stairs: Yes: External: 1 steps; none Has following equipment at home: None  OCCUPATION: Retired  PLOF: Independent  PATIENT GOALS: Improve shoulder pain, get back to pilates, improve chest pain after exercise, get back to gardening  NEXT MD VISIT: None scheduled  OBJECTIVE:  Note: Objective measures were completed at Evaluation unless otherwise noted.  DIAGNOSTIC FINDINGS:  N/a  PATIENT SURVEYS:  Quick Dash 29.5%  COGNITION: Overall cognitive status: Within functional limits for tasks assessed     SENSATION: Light touch: WFL  POSTURE: rounded shoulders  PALPATION: Tenderness to palpation of left supraspinatus, infraspinatus, and levator scapula muscle.  Cause some discomfort following palpation as well.   CERVICAL ROM:   Active ROM A/PROM (deg) eval  Flexion WNL  Extension WNL  Right lateral flexion Limited  Left lateral flexion Limited  Right rotation   Left rotation    (Blank rows = not tested)  UPPER EXTREMITY ROM:  WNL with exception of left internal rotation that caused pain in earlier palpated areas  UPPER EXTREMITY MMT:  MMT Right eval Left eval  Shoulder flexion 4 4  Shoulder extension    Shoulder abduction 4 4  Shoulder adduction    Shoulder extension    Shoulder internal rotation 4 4   Shoulder external rotation 4 4  Middle trapezius    Lower trapezius    Elbow flexion    Elbow extension    Wrist flexion    Wrist extension    Wrist ulnar deviation    Wrist radial deviation    Wrist pronation    Wrist supination    Grip strength     (Blank rows = not tested)    TREATMENT DATE: 03/23/24    Manual:  Supine STM to cervical region to increase extensibility of the paraspinals. Supine suboccipital release technique to decrease cervicalgia  UT stretch with over pressure at [facility] joint 2 x 45 sec bil  Seated STM to Levator and Rhomboids x 2 min with TP  release.  Prone STM to the L glutes with therastick x 2 min   Supine therex;  TE- To improve strength, endurance, mobility, and function of specific targeted muscle groups or improve joint range of motion or improve muscle flexibility Supine isometric shoulder extension and abduction ,  ( PT provided resistance) 2x 10 ea. No pain on this day ER with YTB x 10  Seated shoulder ER YTB x 10  Supine pec strsetch with PVC pipe x 10 with 5 sec hold.  Wall plank walk out x 5 with 3 sec hold.  Wall plank shoulder flexion 5x s bil. Short rest break between reps.  Cues for pelvic position in wall plank. Mild L shoulder pain noted in the Levator upon completion.     PATIENT EDUCATION:  Education details: Pt educated throughout session about proper posture and technique with exercises. Improved exercise technique, movement at target joints, use of target muscles after min to mod verbal, visual, tactile cues.  Person educated: Patient Education method: Explanation Education comprehension: verbalized understanding   HOME EXERCISE PROGRAM: Access Code: [email or web address] URL: [email or web address] Date: 02/12/2024 Prepared by: Thresa Ross  Exercises - Supine Scapular Retraction  - 1 x daily - 3 x weekly - 2 sets - 10 reps - Standing Isometric Shoulder Internal Rotation at Doorway  - 1 x daily - 3 x weekly - 2  sets - 10 reps - Isometric Shoulder External Rotation at Wall  - 1 x daily - 3 x weekly - 2 sets - 10 reps - Seated Shoulder Rolls  - 1 x daily - 3 x weekly - 2 sets - 10 reps   ASSESSMENT:  CLINICAL IMPRESSION:  Pt responded favorably to the manual therapy, noting a reduction in overall pain with some soreness from TP release as in past few session. Increased isometric and concentric demand with wall plank holds and use of resistance bands. Pt reports that she is performing more physical labor at home without pain and wanting to return to pilates soon. Pt will continue to benefit from skilled therapy to address remaining deficits in order to improve overall QoL and return to PLOF.      OBJECTIVE IMPAIRMENTS: decreased activity tolerance, decreased strength, and pain.   ACTIVITY LIMITATIONS: carrying, lifting, and reach over head  PARTICIPATION LIMITATIONS: shopping and yard work  PERSONAL FACTORS: 1-2 comorbidities: AFIB, GERD, TIA  are also affecting patient's functional outcome.   REHAB POTENTIAL: Good  CLINICAL DECISION MAKING: Evolving/moderate complexity  EVALUATION COMPLEXITY: Moderate   GOALS: Goals reviewed with patient? Yes  SHORT TERM GOALS: Target date: 02/26/2024      Patient will be independent in home exercise program to improve strength/mobility for better functional independence with ADLs. Baseline: No HEP currently  Goal status: INITIAL   LONG TERM GOALS: Target date: 02/12/2024    1. Patient will increase QuickDASH score by 10% or greater to indicate improved subjective level of function of the left upper extremity  baseline: 29.5% Goal status: INITIAL  2. Patient will improve ER and IR strength on the LUE without reports of pain to indicate improved functional use of the L UE without pain . Baseline:  See eval  Goal status: INITIAL  3.  Patient will report mild difficulty or less for recreational activities where she put some force or impact  through her arm such as her Pilates activities. Baseline: Unable to at this time Goal status: INITIAL   PLAN:  PT FREQUENCY: 2x/week  PT DURATION: 12 weeks  PLANNED  INTERVENTIONS: 97164- PT Re-evaluation, 97110-Therapeutic exercises, 97530- Therapeutic activity, [id number]- Neuromuscular re-education, 97535- Self Care, [id number]- Manual therapy, Dry Needling, Joint mobilization, and Spinal mobilization.  PLAN FOR NEXT SESSION:  Increase Hep for isometric strength and cervical ROM.  Manual for pain management.    Golden Pop PT ,DPT Physical Therapist- [facility]  03/23/24, 4:21 PM

## 2024-03-30 ENCOUNTER — Ambulatory Visit: Payer: HMO | Admitting: Physical Therapy

## 2024-03-30 DIAGNOSIS — M6281 Muscle weakness (generalized): Secondary | ICD-10-CM | POA: Diagnosis not present

## 2024-03-30 DIAGNOSIS — R262 Difficulty in walking, not elsewhere classified: Secondary | ICD-10-CM

## 2024-03-30 DIAGNOSIS — M25512 Pain in left shoulder: Secondary | ICD-10-CM

## 2024-03-30 DIAGNOSIS — M79602 Pain in left arm: Secondary | ICD-10-CM

## 2024-03-30 DIAGNOSIS — R269 Unspecified abnormalities of gait and mobility: Secondary | ICD-10-CM

## 2024-03-30 DIAGNOSIS — R2689 Other abnormalities of gait and mobility: Secondary | ICD-10-CM

## 2024-03-30 NOTE — Therapy (Unsigned)
 OUTPATIENT PHYSICAL THERAPY THORACOLUMBAR TREATMENT   Patient Name: Alexis Lyons MRN: [id number] DOB:[birth year], 83 y.o., female Today's Date: 03/30/2024  END OF SESSION:  PT End of Session - 03/30/24 1620     Visit Number 9    Number of Visits 24    Date for PT Re-Evaluation 04/22/24    Progress Note Due on Visit 10    PT Start Time 1621    PT Stop Time 1700    PT Time Calculation (min) 39 min    Activity Tolerance Patient tolerated treatment well;Patient limited by pain    Behavior During Therapy [facility] for tasks assessed/performed               Past Medical History:  Diagnosis Date   Amnesia    Aphthae    Atrial fibrillation (HCC)    Congestion-fibrosis syndrome    COVID 12/12/2022   DD (diverticular disease)    GERD (gastroesophageal reflux disease)    Seizures (HCC) 12/2017   passed out due to dehydration   TIA (transient ischemic attack) 10/2023   Past Surgical History:  Procedure Laterality Date   ATRIAL FIBRILLATION ABLATION N/A 11/02/2021   Procedure: ATRIAL FIBRILLATION ABLATION;  Surgeon: Boyce Byes, MD;  Location: [facility] INVASIVE CV LAB;  Service: Cardiovascular;  Laterality: N/A;   CHOLECYSTECTOMY     colonoscopy with polypectomy     8/11 4 mm polyp, 12 mm polyp   COLONOSCOPY WITH PROPOFOL N/A 02/24/2018   Procedure: COLONOSCOPY WITH PROPOFOL;  Surgeon: Cassie Click, MD;  Location: [facility] ENDOSCOPY;  Service: Endoscopy;  Laterality: N/A;   HERNIA REPAIR Right 2004   inguinal    HERNIA REPAIR     x's 3   Patient Active Problem List   Diagnosis Date Noted   History of transient ischemic attack (TIA) 11/08/2023   Acute frontal sinusitis 11/08/2023   Non-seasonal allergic rhinitis 12/20/2022   Headache, rebound 12/20/2022   Hypokalemia 08/16/2022   Hypocalcemia 08/16/2022   Syncope and collapse 08/16/2022   Abnormal EKG 08/16/2022   Hypomagnesemia 08/16/2022   Toe tendonitis 08/15/2022   Elevated LDL cholesterol level 08/15/2022    Strain of right deltoid muscle 01/11/2022   Neuropathy 10/09/2021   Tingling sensation 09/15/2021   Paroxysmal atrial fibrillation (HCC) 06/15/2021   Senile purpura (HCC) 06/15/2021   Chronic headache 01/01/2019   Essential hypertension 07/09/2018   Osteopenia 11/06/2017   Aphthae 05/28/2015   DD (diverticular disease) 05/28/2015   Fatigue 05/28/2015   Hernia, inguinal, right 05/28/2015   Amnesia 05/28/2015   Congestion-fibrosis syndrome 05/28/2015   Atrophy of vagina 05/28/2015   Vulvar lesion 09/21/2008   Insomnia 12/17/2004    PCP:    Mazie Speed, MD    REFERRING PROVIDER:    Mazie Speed, MD    REFERRING DIAG: R07.89 (ICD-10-CM) - Chest wall pain   Rationale for Evaluation and Treatment: Rehabilitation  THERAPY DIAG:  Muscle weakness (generalized)  Acute pain of left shoulder  Abnormality of gait and mobility  Pain in left arm  Difficulty in walking, not elsewhere classified  Other abnormalities of gait and mobility  ONSET DATE: 11/08/23  SUBJECTIVE:  SUBJECTIVE STATEMENT:  Pt reports that she has continued to trim bushes around the house, without increased pain, but will occasionally get a little tightness in the L shoulder. States that she put a little heat and ice on L UT, with good relief.     From eval: Pt first went to hospital the 22 of October for loss of vision in her right eye and was unable to speak clearly. Pt went to the ER and they found it was TIA at that time and she was prescribed statins for her cholesterol. Fast forward Pt was picking up large hanging plants and the next day her chest and shoulder were in significant pain. Pt went back to ED and they thought it was musculoskeletal in nature. Pt instructed to use ice and heat and try PT. Pt  has been trying not to lift anything heavy since.  Last week pt woke up with achiness and soreness in her L arm and went back to MD for this. Pt did not get an ultrasound or anything but did get an EKG and x ray ruling out heart problems.  PERTINENT HISTORY:  AFIB, GERD, TIA  PAIN:  Are you having pain? Yes: NPRS scale: 3 Pain location: L arm Pain description: Ache  Aggravating factors: UE functional use  Relieving factors: rest   PRECAUTIONS: None  RED FLAGS: None   WEIGHT BEARING RESTRICTIONS: No  FALLS:  Has patient fallen in last 6 months? No  LIVING ENVIRONMENT: Lives with: lives alone Lives in: House/apartment Stairs: Yes: External: 1 steps; none Has following equipment at home: None  OCCUPATION: Retired  PLOF: Independent  PATIENT GOALS: Improve shoulder pain, get back to pilates, improve chest pain after exercise, get back to gardening  NEXT MD VISIT: None scheduled  OBJECTIVE:  Note: Objective measures were completed at Evaluation unless otherwise noted.  DIAGNOSTIC FINDINGS:  N/a  PATIENT SURVEYS:  Quick Dash 29.5%  COGNITION: Overall cognitive status: Within functional limits for tasks assessed     SENSATION: Light touch: WFL  POSTURE: rounded shoulders  PALPATION: Tenderness to palpation of left supraspinatus, infraspinatus, and levator scapula muscle.  Cause some discomfort following palpation as well.   CERVICAL ROM:   Active ROM A/PROM (deg) eval  Flexion WNL  Extension WNL  Right lateral flexion Limited  Left lateral flexion Limited  Right rotation   Left rotation    (Blank rows = not tested)  UPPER EXTREMITY ROM:  WNL with exception of left internal rotation that caused pain in earlier palpated areas  UPPER EXTREMITY MMT:  MMT Right eval Left eval  Shoulder flexion 4 4  Shoulder extension    Shoulder abduction 4 4  Shoulder adduction    Shoulder extension    Shoulder internal rotation 4 4  Shoulder external rotation  4 4  Middle trapezius    Lower trapezius    Elbow flexion    Elbow extension    Wrist flexion    Wrist extension    Wrist ulnar deviation    Wrist radial deviation    Wrist pronation    Wrist supination    Grip strength     (Blank rows = not tested)    TREATMENT DATE: 03/30/24  Supine:  bridge x 8 with 5 sec hold  Sidelying clam shell x 12  Sidelying hip abduction x 12  Sidelying open book x 10 bil with 3 sec hold  Double limb lift. X 8  Reciprocal hip flexion  x 10 bli  Qped:  Cat cow x 8, 3 sec hold  Donkey kick x 6 bil 2 sec hold  Single UE flexion x 8 with 2 sec hold.   Shallow squat with RTB at knees x 8 to force glute med activation  Manual:  Hip extensor stretch x 40 sec  HS stretch x 40 sec Hip ER stretch in figure 4 2 x 45 sec (hard end feel) STM to L UT with TP release x 2 min  and L piriformis x 5 min    PATIENT EDUCATION:  Education details: Pt educated throughout session about proper posture and technique with exercises. Improved exercise technique, movement at target joints, use of target muscles after min to mod verbal, visual, tactile cues.  Person educated: Patient Education method: Explanation Education comprehension: verbalized understanding   HOME EXERCISE PROGRAM: Access Code: [email or web address] URL: [email or web address] Date: 03/30/2024 Prepared by: Aurora Lees  Exercises - Supine Scapular Retraction  - 1 x daily - 7 x weekly - 2 sets - 10 reps - Standing Isometric Shoulder Internal Rotation at Doorway  - 1 x daily - 7 x weekly - 2 sets - 10 reps - Isometric Shoulder External Rotation at Wall  - 1 x daily - 7 x weekly - 2 sets - 10 reps - Seated Shoulder Rolls  - 1 x daily - 7 x weekly - 2 sets - 10 reps - Shoulder External Rotation and Scapular Retraction with Resistance  - 1 x daily - 7 x weekly - 3 sets - 10 reps - Doorway Pec Stretch at 60 Elevation  - 1 x daily - 7 x weekly - 3 sets - 4 reps - 10 hold - Supine Bridge  - 1 x  daily - 7 x weekly - 3 sets - 10 reps - 3 hold - Cat Cow  - 1 x daily - 7 x weekly - 3 sets - 10 reps - 3 hold - Quadruped Alternating Leg Extensions  - 1 x daily - 7 x weekly - 3 sets - 10 reps - 3 hold - Sidelying Open Book Thoracic Lumbar Rotation and Extension  - 1 x daily - 7 x weekly - 3 sets - 10 reps - 5 hold  ASSESSMENT:  CLINICAL IMPRESSION:  Pt responded favorably to the manual therapy, noting a reduction in overall pain with some soreness from TP release as in past few session. Increased isometric and concentric demand Qped and AROM for lumbar/tspine as well as increased hip ROM. Will note occasional pain in the L hip and L shoulder due to trigger points, but significantly reduced severity.  Pt will continue to benefit from skilled therapy to address remaining deficits in order to improve overall QoL and return to PLOF.      OBJECTIVE IMPAIRMENTS: decreased activity tolerance, decreased strength, and pain.   ACTIVITY LIMITATIONS: carrying, lifting, and reach over head  PARTICIPATION LIMITATIONS: shopping and yard work  PERSONAL FACTORS: 1-2 comorbidities: AFIB, GERD, TIA  are also affecting patient's functional outcome.   REHAB POTENTIAL: Good  CLINICAL DECISION MAKING: Evolving/moderate complexity  EVALUATION COMPLEXITY: Moderate   GOALS: Goals reviewed with patient? Yes  SHORT TERM GOALS: Target date: 02/26/2024      Patient will be independent in home exercise program to improve strength/mobility for better functional independence with ADLs. Baseline: No HEP currently  Goal status: INITIAL   LONG TERM GOALS: Target date: 02/12/2024    1. Patient will increase QuickDASH score by 10% or greater to indicate improved subjective level  of function of the left upper extremity  baseline: 29.5% Goal status: INITIAL  2. Patient will improve ER and IR strength on the LUE without reports of pain to indicate improved functional use of the L UE without pain  . Baseline:  See eval  Goal status: INITIAL  3.  Patient will report mild difficulty or less for recreational activities where she put some force or impact through her arm such as her Pilates activities. Baseline: Unable to at this time Goal status: INITIAL   PLAN:  PT FREQUENCY: 2x/week  PT DURATION: 12 weeks  PLANNED INTERVENTIONS: 97164- PT Re-evaluation, 97110-Therapeutic exercises, 97530- Therapeutic activity, 97112- Neuromuscular re-education, 97535- Self Care, 82956- Manual therapy, Dry Needling, Joint mobilization, and Spinal mobilization.  PLAN FOR NEXT SESSION:  Assess HEP.  Progress note.    Barbara Book PT ,DPT Physical Therapist- [facility]  03/30/24, 4:23 PM

## 2024-04-02 ENCOUNTER — Ambulatory Visit: Payer: HMO | Admitting: Physical Therapy

## 2024-04-06 ENCOUNTER — Ambulatory Visit: Payer: HMO | Admitting: Physical Therapy

## 2024-04-08 ENCOUNTER — Ambulatory Visit: Payer: HMO | Admitting: Physical Therapy

## 2024-04-08 NOTE — Therapy (Deleted)
 OUTPATIENT PHYSICAL THERAPY THORACOLUMBAR TREATMENT   Patient Name: Alexis Lyons MRN: [id number] DOB:1941/06/06, 83 y.o., female Today's Date: 04/08/2024  END OF SESSION:      Past Medical History:  Diagnosis Date   Amnesia    Aphthae    Atrial fibrillation (HCC)    Congestion-fibrosis syndrome    COVID 12/12/2022   DD (diverticular disease)    GERD (gastroesophageal reflux disease)    Seizures (HCC) 12/2017   passed out due to dehydration   TIA (transient ischemic attack) 10/2023   Past Surgical History:  Procedure Laterality Date   ATRIAL FIBRILLATION ABLATION N/A 11/02/2021   Procedure: ATRIAL FIBRILLATION ABLATION;  Surgeon: Boyce Byes, MD;  Location: [facility] INVASIVE CV LAB;  Service: Cardiovascular;  Laterality: N/A;   CHOLECYSTECTOMY     colonoscopy with polypectomy     8/11 4 mm polyp, 12 mm polyp   COLONOSCOPY WITH PROPOFOL  N/A 02/24/2018   Procedure: COLONOSCOPY WITH PROPOFOL ;  Surgeon: Cassie Click, MD;  Location: [facility] ENDOSCOPY;  Service: Endoscopy;  Laterality: N/A;   HERNIA REPAIR Right 2004   inguinal    HERNIA REPAIR     x's 3   Patient Active Problem List   Diagnosis Date Noted   History of transient ischemic attack (TIA) 11/08/2023   Acute frontal sinusitis 11/08/2023   Non-seasonal allergic rhinitis 12/20/2022   Headache, rebound 12/20/2022   Hypokalemia 08/16/2022   Hypocalcemia 08/16/2022   Syncope and collapse 08/16/2022   Abnormal EKG 08/16/2022   Hypomagnesemia 08/16/2022   Toe tendonitis 08/15/2022   Elevated LDL cholesterol level 08/15/2022   Strain of right deltoid muscle 01/11/2022   Neuropathy 10/09/2021   Tingling sensation 09/15/2021   Paroxysmal atrial fibrillation (HCC) 06/15/2021   Senile purpura (HCC) 06/15/2021   Chronic headache 01/01/2019   Essential hypertension 07/09/2018   Osteopenia 11/06/2017   Aphthae 05/28/2015   DD (diverticular disease) 05/28/2015   Fatigue 05/28/2015   Hernia, inguinal, right  05/28/2015   Amnesia 05/28/2015   Congestion-fibrosis syndrome 05/28/2015   Atrophy of vagina 05/28/2015   Vulvar lesion 09/21/2008   Insomnia 12/17/2004    PCP:    Mazie Speed, MD    REFERRING PROVIDER:    Mazie Speed, MD    REFERRING DIAG: R07.89 (ICD-10-CM) - Chest wall pain   Rationale for Evaluation and Treatment: Rehabilitation  THERAPY DIAG:  Muscle weakness (generalized)  Abnormality of gait and mobility  Acute pain of left shoulder  Pain in left arm  ONSET DATE: 11/08/23  SUBJECTIVE:  SUBJECTIVE STATEMENT:  Pt reports that she has continued to trim bushes around the house, without increased pain, but will occasionally get a little tightness in the L shoulder. States that she put a little heat and ice on L UT, with good relief.     From eval: Pt first went to hospital the 22 of October for loss of vision in her right eye and was unable to speak clearly. Pt went to the ER and they found it was TIA at that time and she was prescribed statins for her cholesterol. Fast forward Pt was picking up large hanging plants and the next day her chest and shoulder were in significant pain. Pt went back to ED and they thought it was musculoskeletal in nature. Pt instructed to use ice and heat and try PT. Pt has been trying not to lift anything heavy since.  Last week pt woke up with achiness and soreness in her L arm and went back to MD for this. Pt did not get an ultrasound or anything but did get an EKG and x ray ruling out heart problems.  PERTINENT HISTORY:  AFIB, GERD, TIA  PAIN:  Are you having pain? Yes: NPRS scale: 3 Pain location: L arm Pain description: Ache  Aggravating factors: UE functional use  Relieving factors: rest   PRECAUTIONS: None  RED  FLAGS: None   WEIGHT BEARING RESTRICTIONS: No  FALLS:  Has patient fallen in last 6 months? No  LIVING ENVIRONMENT: Lives with: lives alone Lives in: House/apartment Stairs: Yes: External: 1 steps; none Has following equipment at home: None  OCCUPATION: Retired  PLOF: Independent  PATIENT GOALS: Improve shoulder pain, get back to pilates, improve chest pain after exercise, get back to gardening  NEXT MD VISIT: None scheduled  OBJECTIVE:  Note: Objective measures were completed at Evaluation unless otherwise noted.  DIAGNOSTIC FINDINGS:  N/a  PATIENT SURVEYS:  Quick Dash 29.5%  COGNITION: Overall cognitive status: Within functional limits for tasks assessed     SENSATION: Light touch: WFL  POSTURE: rounded shoulders  PALPATION: Tenderness to palpation of left supraspinatus, infraspinatus, and levator scapula muscle.  Cause some discomfort following palpation as well.   CERVICAL ROM:   Active ROM A/PROM (deg) eval  Flexion WNL  Extension WNL  Right lateral flexion Limited  Left lateral flexion Limited  Right rotation   Left rotation    (Blank rows = not tested)  UPPER EXTREMITY ROM:  WNL with exception of left internal rotation that caused pain in earlier palpated areas  UPPER EXTREMITY MMT:  MMT Right eval Left eval  Shoulder flexion 4 4  Shoulder extension    Shoulder abduction 4 4  Shoulder adduction    Shoulder extension    Shoulder internal rotation 4 4  Shoulder external rotation 4 4  Middle trapezius    Lower trapezius    Elbow flexion    Elbow extension    Wrist flexion    Wrist extension    Wrist ulnar deviation    Wrist radial deviation    Wrist pronation    Wrist supination    Grip strength     (Blank rows = not tested)    TREATMENT DATE: 04/08/24  Supine:  bridge x 8 with 5 sec hold  Sidelying clam shell x 12  Sidelying hip abduction x 12  Sidelying open book x 10 bil with 3 sec hold  Double limb lift. X 8   Reciprocal hip flexion  x 10 bli  Qped:  Cat cow x 8, 3 sec hold  Donkey kick x 6 bil 2 sec hold  Single UE flexion x 8 with 2 sec hold.   Shallow squat with RTB at knees x 8 to force glute med activation  Manual:  Hip extensor stretch x 40 sec  HS stretch x 40 sec Hip ER stretch in figure 4 2 x 45 sec (hard end feel) STM to L UT with TP release x 2 min  and L piriformis x 5 min    PATIENT EDUCATION:  Education details: Pt educated throughout session about proper posture and technique with exercises. Improved exercise technique, movement at target joints, use of target muscles after min to mod verbal, visual, tactile cues.  Person educated: Patient Education method: Explanation Education comprehension: verbalized understanding   HOME EXERCISE PROGRAM: Access Code: [email or web address] URL: [email or web address] Date: 03/30/2024 Prepared by: Aurora Lees  Exercises - Supine Scapular Retraction  - 1 x daily - 7 x weekly - 2 sets - 10 reps - Standing Isometric Shoulder Internal Rotation at Doorway  - 1 x daily - 7 x weekly - 2 sets - 10 reps - Isometric Shoulder External Rotation at Wall  - 1 x daily - 7 x weekly - 2 sets - 10 reps - Seated Shoulder Rolls  - 1 x daily - 7 x weekly - 2 sets - 10 reps - Shoulder External Rotation and Scapular Retraction with Resistance  - 1 x daily - 7 x weekly - 3 sets - 10 reps - Doorway Pec Stretch at 60 Elevation  - 1 x daily - 7 x weekly - 3 sets - 4 reps - 10 hold - Supine Bridge  - 1 x daily - 7 x weekly - 3 sets - 10 reps - 3 hold - Cat Cow  - 1 x daily - 7 x weekly - 3 sets - 10 reps - 3 hold - Quadruped Alternating Leg Extensions  - 1 x daily - 7 x weekly - 3 sets - 10 reps - 3 hold - Sidelying Open Book Thoracic Lumbar Rotation and Extension  - 1 x daily - 7 x weekly - 3 sets - 10 reps - 5 hold  ASSESSMENT:  CLINICAL IMPRESSION:  Pt responded favorably to the manual therapy, noting a reduction in overall pain with some soreness  from TP release as in past few session. Increased isometric and concentric demand Qped and AROM for lumbar/tspine as well as increased hip ROM. Will note occasional pain in the L hip and L shoulder due to trigger points, but significantly reduced severity.  Pt will continue to benefit from skilled therapy to address remaining deficits in order to improve overall QoL and return to PLOF.      OBJECTIVE IMPAIRMENTS: decreased activity tolerance, decreased strength, and pain.   ACTIVITY LIMITATIONS: carrying, lifting, and reach over head  PARTICIPATION LIMITATIONS: shopping and yard work  PERSONAL FACTORS: 1-2 comorbidities: AFIB, GERD, TIA  are also affecting patient's functional outcome.   REHAB POTENTIAL: Good  CLINICAL DECISION MAKING: Evolving/moderate complexity  EVALUATION COMPLEXITY: Moderate   GOALS: Goals reviewed with patient? Yes  SHORT TERM GOALS: Target date: 02/26/2024      Patient will be independent in home exercise program to improve strength/mobility for better functional independence with ADLs. Baseline: No HEP currently  Goal status: INITIAL   LONG TERM GOALS: Target date: 02/12/2024    1. Patient will increase QuickDASH score by 10% or greater to indicate improved subjective level  of function of the left upper extremity  baseline: 29.5% Goal status: INITIAL  2. Patient will improve ER and IR strength on the LUE without reports of pain to indicate improved functional use of the L UE without pain . Baseline:  See eval  Goal status: INITIAL  3.  Patient will report mild difficulty or less for recreational activities where she put some force or impact through her arm such as her Pilates activities. Baseline: Unable to at this time Goal status: INITIAL   PLAN:  PT FREQUENCY: 2x/week  PT DURATION: 12 weeks  PLANNED INTERVENTIONS: 97164- PT Re-evaluation, 97110-Therapeutic exercises, 97530- Therapeutic activity, 97112- Neuromuscular re-education, 97535-  Self Care, 40981- Manual therapy, Dry Needling, Joint mobilization, and Spinal mobilization.  PLAN FOR NEXT SESSION:  Assess HEP.  Progress note.    Edwina Gram PT ,DPT Physical Therapist- [facility]  04/08/24, 8:22 AM

## 2024-04-09 ENCOUNTER — Encounter: Payer: Self-pay | Admitting: Family Medicine

## 2024-04-09 ENCOUNTER — Ambulatory Visit (INDEPENDENT_AMBULATORY_CARE_PROVIDER_SITE_OTHER): Payer: Self-pay | Admitting: Family Medicine

## 2024-04-09 VITALS — BP 124/77 | HR 59 | Ht 63.0 in | Wt 123.1 lb

## 2024-04-09 DIAGNOSIS — Z1231 Encounter for screening mammogram for malignant neoplasm of breast: Secondary | ICD-10-CM

## 2024-04-09 DIAGNOSIS — I10 Essential (primary) hypertension: Secondary | ICD-10-CM | POA: Diagnosis not present

## 2024-04-09 DIAGNOSIS — I480 Paroxysmal atrial fibrillation: Secondary | ICD-10-CM

## 2024-04-09 DIAGNOSIS — E7800 Pure hypercholesterolemia, unspecified: Secondary | ICD-10-CM

## 2024-04-09 DIAGNOSIS — M8580 Other specified disorders of bone density and structure, unspecified site: Secondary | ICD-10-CM | POA: Diagnosis not present

## 2024-04-09 DIAGNOSIS — F5102 Adjustment insomnia: Secondary | ICD-10-CM | POA: Diagnosis not present

## 2024-04-09 DIAGNOSIS — Z0001 Encounter for general adult medical examination with abnormal findings: Secondary | ICD-10-CM | POA: Diagnosis not present

## 2024-04-09 DIAGNOSIS — R739 Hyperglycemia, unspecified: Secondary | ICD-10-CM

## 2024-04-09 DIAGNOSIS — D692 Other nonthrombocytopenic purpura: Secondary | ICD-10-CM | POA: Diagnosis not present

## 2024-04-09 DIAGNOSIS — Z0000 Encounter for general adult medical examination without abnormal findings: Secondary | ICD-10-CM

## 2024-04-09 NOTE — Assessment & Plan Note (Signed)
Well controlled Continue current medications Reviewed metabolic panel F/u in 6 months  

## 2024-04-09 NOTE — Assessment & Plan Note (Signed)
Patient declines further testing, especially as this would not change her management She would not want treatment even if she had progressed to osteoporosis Discussed not smoking, regular weight bearing exercise, Ca/Vit D supplementation

## 2024-04-09 NOTE — Assessment & Plan Note (Signed)
Continue atorvastatin Recheck lipids

## 2024-04-09 NOTE — Patient Instructions (Signed)
 Call [facility] to schedule a mammogram [phone]

## 2024-04-09 NOTE — Assessment & Plan Note (Signed)
 Stable.       - Continue to monitor

## 2024-04-09 NOTE — Assessment & Plan Note (Signed)
 Doing well on Ambien  2.5mg  qhs prn  Takes sparingly Discussed risks of hypersomnolence, falls, confusion in the elderly Will not plan to escalate dose or number of pills given in the future

## 2024-04-09 NOTE — Progress Notes (Signed)
 Annual Wellness Visit     Patient: Alexis Lyons, Female    DOB: 1941-11-08, 83 y.o.   MRN: [id number] Visit Date: 04/09/2024  Today's Provider: Aden Agreste, MD   Chief Complaint  Patient presents with   Annual Exam    Diet -  Healthy Exercise - walking/hiking at least 4 days a week for 30 minutes to a hour Feeling - well Sleeping - well Concerns - last time in ED was advised potassium was low and has tried to make changes so concerned and would like it rechecked and wanting to know if it is something more she should do as they made it sound like it was a concerning level   Care Management    Dexa Scan declined   Subjective    Alexis Lyons is a 83 y.o. female who presents today for her Annual Wellness Visit.  Discussed the use of AI scribe software for clinical note transcription with the patient, who gave verbal consent to proceed.  History of Present Illness   An 83 year old patient with a history of hypertension, paroxysmal atrial fibrillation, and osteopenia presents for her annual physical and wellness visit. She reports feeling well overall and has been attending physical therapy for her shoulder, which she says has improved significantly. She initially had reservations about the therapy but now acknowledges its benefits.  The patient also mentions some memory issues, such as forgetting why she entered a room or where she parked her car. However, she believes that when she focuses, she can still keep track of things. She also notes some difficulty with word recall. Despite these concerns, a comprehensive memory test conducted during the visit showed her memory to be within normal limits for her age.  The patient also reports issues with aphthous ulcers in her mouth. She has been prescribed a mouthwash for this issue, but notes that the ulcers sometimes get irritated for no apparent reason. She suspects that certain foods, such as lollipops, may exacerbate the  condition.          04/09/2024   11:03 AM  MMSE - Mini Mental State Exam  Orientation to time 5  Orientation to Place 5  Registration 3  Attention/ Calculation 5  Recall 3  Language- name 2 objects 2  Language- repeat 1  Language- follow 3 step command 3  Language- read & follow direction 1  Write a sentence 1  Copy design 1  Total score 30          Medications: Outpatient Medications Prior to Visit  Medication Sig   apixaban  (ELIQUIS ) 2.5 MG TABS tablet Take 1 tablet (2.5 mg total) by mouth 2 (two) times daily.   atorvastatin  (LIPITOR) 40 MG tablet TAKE 1 TABLET BY MOUTH EVERY DAY   conjugated estrogens  (PREMARIN ) vaginal cream Place 1 Applicatorful vaginally 2 (two) times a week.   Cyanocobalamin (B-12 PO) Take by mouth daily.   hydrochlorothiazide  (HYDRODIURIL ) 12.5 MG tablet TAKE 1 TABLET BY MOUTH EVERY DAY   metoprolol  succinate (TOPROL  XL) 25 MG 24 hr tablet Take 0.5 tablets (12.5 mg total) by mouth daily.   zolpidem  (AMBIEN ) 5 MG tablet Take 0.5 tablets (2.5 mg total) by mouth at bedtime as needed for sleep.   No facility-administered medications prior to visit.    Allergies  Allergen Reactions   Codeine Other (See Comments)    Messed up head really bad   Amlodipine  Palpitations and Other (See Comments)    Developed flushed feeling  in her face, dizziness    Patient Care Team: Mazie Speed, MD as PCP - General (Family Medicine) Constancia Delton, MD as PCP - Cardiology (Cardiology) Boyce Byes, MD as PCP - Electrophysiology (Cardiology)  Review of Systems       Objective    Vitals: BP 124/77 (BP Location: Left Arm, Patient Position: Sitting, Cuff Size: Normal)   Pulse (!) 59   Ht 5\' 3"  (1.6 m)   Wt 123 lb 1.6 oz (55.8 kg)   SpO2 98%   BMI 21.81 kg/m      Physical Exam Vitals reviewed.  Constitutional:      General: She is not in acute distress.    Appearance: Normal appearance. She is well-developed. She is not  diaphoretic.  HENT:     Head: Normocephalic and atraumatic.     Right Ear: Tympanic membrane, ear canal and external ear normal.     Left Ear: Tympanic membrane, ear canal and external ear normal.     Nose: Nose normal.     Mouth/Throat:     Mouth: Mucous membranes are moist.     Pharynx: Oropharynx is clear. No oropharyngeal exudate.  Eyes:     General: No scleral icterus.    Conjunctiva/sclera: Conjunctivae normal.     Pupils: Pupils are equal, round, and reactive to light.  Neck:     Thyroid : No thyromegaly.  Cardiovascular:     Rate and Rhythm: Normal rate and regular rhythm.     Heart sounds: Normal heart sounds. No murmur heard. Pulmonary:     Effort: Pulmonary effort is normal. No respiratory distress.     Breath sounds: Normal breath sounds. No wheezing or rales.  Abdominal:     General: There is no distension.     Palpations: Abdomen is soft.     Tenderness: There is no abdominal tenderness.  Musculoskeletal:        General: No deformity.     Cervical back: Neck supple.     Right lower leg: No edema.     Left lower leg: No edema.  Lymphadenopathy:     Cervical: No cervical adenopathy.  Skin:    General: Skin is warm and dry.     Findings: No rash.  Neurological:     Mental Status: She is alert and oriented to person, place, and time. Mental status is at baseline.     Gait: Gait normal.  Psychiatric:        Mood and Affect: Mood normal.        Behavior: Behavior normal.        Thought Content: Thought content normal.     Most recent functional status assessment:    04/09/2024   10:42 AM  In your present state of health, do you have any difficulty performing the following activities:  Hearing? 0  Vision? 0  Difficulty concentrating or making decisions? 1  Walking or climbing stairs? 0  Dressing or bathing? 0  Doing errands, shopping? 0  Preparing Food and eating ? N  Using the Toilet? N  In the past six months, have you accidently leaked urine? N  Do  you have problems with loss of bowel control? N  Managing your Medications? N  Managing your Finances? N  Housekeeping or managing your Housekeeping? N   Most recent fall risk assessment:    04/09/2024   10:47 AM  Fall Risk   Falls in the past year? 0  Number falls in past yr: 0  Injury with Fall? 0  Risk for fall due to : No Fall Risks  Follow up Falls evaluation completed    Most recent depression screenings:    11/21/2023    1:40 PM 09/17/2023    2:17 PM  PHQ 2/9 Scores  PHQ - 2 Score 0 0  PHQ- 9 Score 0    Most recent cognitive screening:    04/09/2024   10:48 AM  6CIT Screen  What Year? 0 points  What month? 0 points  What time? 0 points  Count back from 20 0 points  Months in reverse 2 points  Repeat phrase 4 points  Total Score 6 points   Most recent Audit-C alcohol use screening    04/09/2024   10:45 AM  Alcohol Use Disorder Test (AUDIT)  1. How often do you have a drink containing alcohol? 3  2. How many drinks containing alcohol do you have on a typical day when you are drinking? 0  3. How often do you have six or more drinks on one occasion? 0  AUDIT-C Score 3   A score of 3 or more in women, and 4 or more in men indicates increased risk for alcohol abuse, EXCEPT if all of the points are from question 1   No results found.  No results found for any visits on 04/09/24.  Assessment & Plan     Annual wellness visit done today including the all of the following: Reviewed patient's Family Medical History Reviewed and updated list of patient's medical providers Assessment of cognitive impairment was done Assessed patient's functional ability Established a written schedule for health screening services Health Risk Assessent Completed and Reviewed  Exercise Activities and Dietary recommendations  Goals   None     Immunization History  Administered Date(s) Administered   Hepatitis A 07/26/2015   PFIZER(Purple Top)SARS-COV-2 Vaccination 01/06/2020,  01/25/2020, 12/28/2020   Pneumococcal Conjugate-13 07/29/2019   Pneumococcal Polysaccharide-23 01/01/2011   Td 06/23/2008   Tdap 11/30/2019   Typhoid Inactivated 07/26/2015   Zoster Recombinant(Shingrix) 06/25/2019, 10/06/2019    Health Maintenance  Topic Date Due   COVID-19 Vaccine (4 - 2024-25 season) 08/18/2023   INFLUENZA VACCINE  07/17/2024   Medicare Annual Wellness (AWV)  04/09/2025   DTaP/Tdap/Td (3 - Td or Tdap) 11/29/2029   Pneumonia Vaccine 42+ Years old  Completed   DEXA SCAN  Completed   Zoster Vaccines- Shingrix  Completed   HPV VACCINES  Aged Out   Meningococcal B Vaccine  Aged Out     Discussed health benefits of physical activity, and encouraged her to engage in regular exercise appropriate for her age and condition.    Problem List Items Addressed This Visit       Cardiovascular and Mediastinum   Essential hypertension   Well controlled Continue current medications Reviewed metabolic panel F/u in 6 months       Relevant Orders   Hemoglobin A1c   Comprehensive metabolic panel with GFR   Lipid panel   Paroxysmal atrial fibrillation (HCC)   conitnue low dose eliquis  F/b cardiology No rapid rate today Continue metop at current dose      Relevant Orders   Hemoglobin A1c   Comprehensive metabolic panel with GFR   Lipid panel   Senile purpura (HCC)   Stable Continue to monitor      Relevant Orders   Hemoglobin A1c   Comprehensive metabolic panel with GFR   Lipid panel     Musculoskeletal and Integument  Osteopenia   Patient declines further testing, especially as this would not change her management She would not want treatment even if she had progressed to osteoporosis Discussed not smoking, regular weight bearing exercise, Ca/Vit D supplementation        Other   Insomnia   Doing well on Ambien  2.5mg  qhs prn  Takes sparingly Discussed risks of hypersomnolence, falls, confusion in the elderly Will not plan to escalate dose or  number of pills given in the future      Elevated LDL cholesterol level   Continue atorvastatin  Recheck lipids      Relevant Orders   Hemoglobin A1c   Comprehensive metabolic panel with GFR   Lipid panel   Other Visit Diagnoses       Encounter for annual wellness visit (AWV) in Medicare patient    -  Primary     Encounter for annual physical exam       Relevant Orders   Hemoglobin A1c   Comprehensive metabolic panel with GFR   Lipid panel     Breast cancer screening by mammogram       Relevant Orders   MM 3D SCREENING MAMMOGRAM BILATERAL BREAST     Hyperglycemia       Relevant Orders   Hemoglobin A1c   Comprehensive metabolic panel with GFR   Lipid panel           Wellness Visit Annual wellness visit conducted. No issues with self-care, falls, or depression symptoms. Memory test showed mild slippage, but no significant concerns. Blood pressure is well-controlled. - Perform memory test annually to track cognitive function - Order labs for cholesterol, potassium, kidney and liver function, and A1c - Order mammogram in May  Mild Cognitive Impairment Mild cognitive impairment with some memory slippage. Memory test score of 30/30 indicates good cognitive function when focused. No immediate intervention required, but will monitor for progression. - Perform annual memory test to monitor cognitive function        Return in about 6 months (around 10/09/2024) for chronic disease f/u.     Aden Agreste, MD  [facility] [phone] (phone) [phone] (fax)  [facility]

## 2024-04-09 NOTE — Assessment & Plan Note (Signed)
conitnue low dose eliquis F/b cardiology No rapid rate today Continue metop at current dose

## 2024-04-10 ENCOUNTER — Encounter: Payer: Self-pay | Admitting: Family Medicine

## 2024-04-10 LAB — COMPREHENSIVE METABOLIC PANEL WITH GFR
ALT: 29 IU/L (ref 0–32)
AST: 24 IU/L (ref 0–40)
Albumin: 4.5 g/dL (ref 3.7–4.7)
Alkaline Phosphatase: 81 IU/L (ref 44–121)
BUN/Creatinine Ratio: 19 (ref 12–28)
BUN: 16 mg/dL (ref 8–27)
Bilirubin Total: 0.8 mg/dL (ref 0.0–1.2)
CO2: 28 mmol/L (ref 20–29)
Calcium: 9.4 mg/dL (ref 8.7–10.3)
Chloride: 103 mmol/L (ref 96–106)
Creatinine, Ser: 0.85 mg/dL (ref 0.57–1.00)
Globulin, Total: 1.8 g/dL (ref 1.5–4.5)
Glucose: 98 mg/dL (ref 70–99)
Potassium: 3.8 mmol/L (ref 3.5–5.2)
Sodium: 144 mmol/L (ref 134–144)
Total Protein: 6.3 g/dL (ref 6.0–8.5)
eGFR: 69 mL/min/{1.73_m2} (ref >59)

## 2024-04-10 LAB — LIPID PANEL
Chol/HDL Ratio: 1.9 ratio (ref 0.0–4.4)
Cholesterol, Total: 106 mg/dL (ref 100–199)
HDL: 56 mg/dL (ref >39)
LDL Chol Calc (NIH): 33 mg/dL (ref 0–99)
Triglycerides: 87 mg/dL (ref 0–149)
VLDL Cholesterol Cal: 17 mg/dL (ref 5–40)

## 2024-04-10 LAB — HEMOGLOBIN A1C
Est. average glucose Bld gHb Est-mCnc: 123 mg/dL
Hgb A1c MFr Bld: 5.9 % — ABNORMAL HIGH (ref 4.8–5.6)

## 2024-04-13 ENCOUNTER — Encounter: Payer: HMO | Admitting: Physical Therapy

## 2024-04-15 ENCOUNTER — Encounter: Payer: HMO | Admitting: Physical Therapy

## 2024-04-20 ENCOUNTER — Ambulatory Visit: Payer: HMO | Attending: Family Medicine | Admitting: Physical Therapy

## 2024-04-20 DIAGNOSIS — R269 Unspecified abnormalities of gait and mobility: Secondary | ICD-10-CM | POA: Diagnosis not present

## 2024-04-20 DIAGNOSIS — M25512 Pain in left shoulder: Secondary | ICD-10-CM | POA: Insufficient documentation

## 2024-04-20 DIAGNOSIS — R2681 Unsteadiness on feet: Secondary | ICD-10-CM | POA: Diagnosis not present

## 2024-04-20 DIAGNOSIS — R262 Difficulty in walking, not elsewhere classified: Secondary | ICD-10-CM | POA: Insufficient documentation

## 2024-04-20 DIAGNOSIS — M6281 Muscle weakness (generalized): Secondary | ICD-10-CM | POA: Insufficient documentation

## 2024-04-20 DIAGNOSIS — R2689 Other abnormalities of gait and mobility: Secondary | ICD-10-CM | POA: Insufficient documentation

## 2024-04-20 DIAGNOSIS — M79602 Pain in left arm: Secondary | ICD-10-CM | POA: Insufficient documentation

## 2024-04-20 NOTE — Therapy (Unsigned)
 OUTPATIENT PHYSICAL THERAPY THORACOLUMBAR TREATMENT   Patient Name: Alexis Lyons MRN: [id number] DOB:02-01-1941, 83 y.o., female Today's Date: 04/20/2024  END OF SESSION:  PT End of Session - 04/20/24 1403     Visit Number 10    Number of Visits 24    Date for PT Re-Evaluation 04/22/24    Progress Note Due on Visit 10    PT Start Time 1403    PT Stop Time 1445    PT Time Calculation (min) 42 min    Activity Tolerance Patient tolerated treatment well;Patient limited by pain    Behavior During Therapy [facility] for tasks assessed/performed                Past Medical History:  Diagnosis Date   Amnesia    Aphthae    Atrial fibrillation (HCC)    Congestion-fibrosis syndrome    COVID 12/12/2022   DD (diverticular disease)    GERD (gastroesophageal reflux disease)    Seizures (HCC) 12/2017   passed out due to dehydration   TIA (transient ischemic attack) 10/2023   Past Surgical History:  Procedure Laterality Date   ATRIAL FIBRILLATION ABLATION N/A 11/02/2021   Procedure: ATRIAL FIBRILLATION ABLATION;  Surgeon: Boyce Byes, MD;  Location: [facility] INVASIVE CV LAB;  Service: Cardiovascular;  Laterality: N/A;   CHOLECYSTECTOMY     colonoscopy with polypectomy     8/11 4 mm polyp, 12 mm polyp   COLONOSCOPY WITH PROPOFOL  N/A 02/24/2018   Procedure: COLONOSCOPY WITH PROPOFOL ;  Surgeon: Cassie Click, MD;  Location: [facility] ENDOSCOPY;  Service: Endoscopy;  Laterality: N/A;   HERNIA REPAIR Right 2004   inguinal    HERNIA REPAIR     x's 3   Patient Active Problem List   Diagnosis Date Noted   History of transient ischemic attack (TIA) 11/08/2023   Non-seasonal allergic rhinitis 12/20/2022   Abnormal EKG 08/16/2022   Elevated LDL cholesterol level 08/15/2022   Neuropathy 10/09/2021   Paroxysmal atrial fibrillation (HCC) 06/15/2021   Senile purpura (HCC) 06/15/2021   Essential hypertension 07/09/2018   Osteopenia 11/06/2017   DD (diverticular disease) 05/28/2015    Fatigue 05/28/2015   Hernia, inguinal, right 05/28/2015   Atrophy of vagina 05/28/2015   Insomnia 12/17/2004    PCP:    Mazie Speed, MD    REFERRING PROVIDER:    Mazie Speed, MD    REFERRING DIAG: R07.89 (ICD-10-CM) - Chest wall pain   Rationale for Evaluation and Treatment: Rehabilitation  THERAPY DIAG:  Muscle weakness (generalized)  Acute pain of left shoulder  Abnormality of gait and mobility  Other abnormalities of gait and mobility  Pain in left arm  Difficulty in walking, not elsewhere classified  Unsteadiness on feet  ONSET DATE: 11/08/23  SUBJECTIVE:  SUBJECTIVE STATEMENT:  Pt reports that she has continued to trim bushes around the house, without increased pain.  Short break from PT due to family marriage.  Has alittle soreness in the AM, but passes quickly. Feels ready to d/c from PT services.     From eval: Pt first went to hospital the 22 of October for loss of vision in her right eye and was unable to speak clearly. Pt went to the ER and they found it was TIA at that time and she was prescribed statins for her cholesterol. Fast forward Pt was picking up large hanging plants and the next day her chest and shoulder were in significant pain. Pt went back to ED and they thought it was musculoskeletal in nature. Pt instructed to use ice and heat and try PT. Pt has been trying not to lift anything heavy since.  Last week pt woke up with achiness and soreness in her L arm and went back to MD for this. Pt did not get an ultrasound or anything but did get an EKG and x ray ruling out heart problems.  PERTINENT HISTORY:  AFIB, GERD, TIA  PAIN:  Are you having pain? Yes: NPRS scale: 3 Pain location: L arm Pain description: Ache  Aggravating factors: UE  functional use  Relieving factors: rest   PRECAUTIONS: None  RED FLAGS: None   WEIGHT BEARING RESTRICTIONS: No  FALLS:  Has patient fallen in last 6 months? No  LIVING ENVIRONMENT: Lives with: lives alone Lives in: House/apartment Stairs: Yes: External: 1 steps; none Has following equipment at home: None  OCCUPATION: Retired  PLOF: Independent  PATIENT GOALS: Improve shoulder pain, get back to pilates, improve chest pain after exercise, get back to gardening  NEXT MD VISIT: None scheduled  OBJECTIVE:  Note: Objective measures were completed at Evaluation unless otherwise noted.  DIAGNOSTIC FINDINGS:  N/a  PATIENT SURVEYS:  Quick Dash 29.5%  COGNITION: Overall cognitive status: Within functional limits for tasks assessed     SENSATION: Light touch: WFL  POSTURE: rounded shoulders  PALPATION: Tenderness to palpation of left supraspinatus, infraspinatus, and levator scapula muscle.  Cause some discomfort following palpation as well.   CERVICAL ROM:   Active ROM A/PROM (deg) eval  Flexion WNL  Extension WNL  Right lateral flexion Limited  Left lateral flexion Limited  Right rotation   Left rotation    (Blank rows = not tested)  UPPER EXTREMITY ROM:  WNL with exception of left internal rotation that caused pain in earlier palpated areas  Shoulder IR WFL.  Shoulder ER 75deg on the RUE and 70 deg on the LLE   UPPER EXTREMITY MMT:  MMT Right eval Left eval  Shoulder flexion 4+ 4+  Shoulder extension    Shoulder abduction 4+ 4+  Shoulder adduction    Shoulder extension    Shoulder internal rotation 4+ 4+  Shoulder external rotation 4+ 4+  Middle trapezius    Lower trapezius    Elbow flexion    Elbow extension    Wrist flexion    Wrist extension    Wrist ulnar deviation    Wrist radial deviation    Wrist pronation    Wrist supination    Grip strength     (Blank rows = not tested)    TREATMENT DATE: 04/20/24  PT instructed pt in goal  assessment for progress note. Pt completed Quick DASH. Improved function with decreased disability to 9%.   Improved ROM in BUE, completely pain free.  Pt reports that she has some tightness in the AM, but resolves with mobility through the morning.   Has been able to complete pilates movements in session on this day, as well as prior sessions reports that she has not attempted   Supine:  bridge x 8 with 5 sec hold  Sidelying clam shell x 10 Supine SLP over  Sidelying hip abduction x 12  Sidelying open book x 10 bil with 3 sec hold  single limb lift ver cone and back. X 8  Reciprocal hip flexion  x 10 bli  Qped:  Cat cow x 8, 3 sec hold  Donkey kick x 8 bil 2 sec hold  Single UE flexion x 8 with 2 sec hold.   Seated:  Shoulder ER with retraction x 10 YTB.  UT stretch x 2 bil for 25 sec hold.  Education in use of TP management with theracane. Pt reports that pressure is too intense for consistent use.     PATIENT EDUCATION:  Education details: Pt educated throughout session about proper posture and technique with exercises. Improved exercise technique, movement at target joints, use of target muscles after min to mod verbal, visual, tactile cues.  Person educated: Patient Education method: Explanation Education comprehension: verbalized understanding   HOME EXERCISE PROGRAM: Access Code: [email or web address] URL: [email or web address] Date: 03/30/2024 Prepared by: Aurora Lees  Exercises - Supine Scapular Retraction  - 1 x daily - 7 x weekly - 2 sets - 10 reps - Standing Isometric Shoulder Internal Rotation at Doorway  - 1 x daily - 7 x weekly - 2 sets - 10 reps - Isometric Shoulder External Rotation at Wall  - 1 x daily - 7 x weekly - 2 sets - 10 reps - Seated Shoulder Rolls  - 1 x daily - 7 x weekly - 2 sets - 10 reps - Shoulder External Rotation and Scapular Retraction with Resistance  - 1 x daily - 7 x weekly - 3 sets - 10 reps - Doorway Pec Stretch at 60 Elevation   - 1 x daily - 7 x weekly - 3 sets - 4 reps - 10 hold - Supine Bridge  - 1 x daily - 7 x weekly - 3 sets - 10 reps - 3 hold - Cat Cow  - 1 x daily - 7 x weekly - 3 sets - 10 reps - 3 hold - Quadruped Alternating Leg Extensions  - 1 x daily - 7 x weekly - 3 sets - 10 reps - 3 hold - Sidelying Open Book Thoracic Lumbar Rotation and Extension  - 1 x daily - 7 x weekly - 3 sets - 10 reps - 5 hold  ASSESSMENT:  CLINICAL IMPRESSION:  PT instructed pt in goal assessment for progress note. Has met all LTG and STGs. No pain with functional mobility or with expanded HEP for the last few weeks. Pt instructed in HEP review, with no pain throughout treatment. Due to improved function, reduced pain and increased ROM, pt will no longer require skilled PT to address deficits   OBJECTIVE IMPAIRMENTS: decreased activity tolerance, decreased strength, and pain.   ACTIVITY LIMITATIONS: carrying, lifting, and reach over head  PARTICIPATION LIMITATIONS: shopping and yard work  PERSONAL FACTORS: 1-2 comorbidities: AFIB, GERD, TIA  are also affecting patient's functional outcome.   REHAB POTENTIAL: Good  CLINICAL DECISION MAKING: Evolving/moderate complexity  EVALUATION COMPLEXITY: Moderate   GOALS: Goals reviewed with patient? Yes  SHORT TERM GOALS: Target date: 02/26/2024  Patient will be independent in home exercise program to improve strength/mobility for better functional independence with ADLs. Baseline: provided and expanded on 4/14.  Goal status: MET   LONG TERM GOALS: Target date: 02/12/2024    1. Patient will increase QuickDASH score by 10% or greater to indicate improved subjective level of function of the left upper extremity  baseline: 29.5% 5/5: 9%  Goal status: MET   2. Patient will improve ER and IR strength on the LUE without reports of pain to indicate improved functional use of the L UE without pain . Baseline:  See eval  Goal status: MET  3.  Patient will report  mild difficulty or less for recreational activities where she put some force or impact through her arm such as her Pilates activities. Baseline: completed at prior PT appointment without pain.  Goal status:MET   PLAN:  PT FREQUENCY: 2x/week  PT DURATION: 12 weeks  PLANNED INTERVENTIONS: 97164- PT Re-evaluation, 97110-Therapeutic exercises, 97530- Therapeutic activity, 97112- Neuromuscular re-education, 97535- Self Care, [id number]- Manual therapy, Dry Needling, Joint mobilization, and Spinal mobilization.  PLAN FOR NEXT SESSION:  N.A   Barbara Book PT ,DPT Physical Therapist- [facility]  04/20/24, 2:04 PM

## 2024-04-22 ENCOUNTER — Ambulatory Visit: Payer: HMO | Admitting: Physical Therapy

## 2024-04-27 ENCOUNTER — Ambulatory Visit: Payer: HMO | Admitting: Physical Therapy

## 2024-04-27 ENCOUNTER — Other Ambulatory Visit: Payer: Self-pay | Admitting: Family Medicine

## 2024-04-28 NOTE — Telephone Encounter (Signed)
 Requested Prescriptions  Pending Prescriptions Disp Refills   hydrochlorothiazide  (HYDRODIURIL ) 12.5 MG tablet [Pharmacy Med Name: HYDROCHLOROTHIAZIDE  12.5 MG TB] 90 tablet 1    Sig: TAKE 1 TABLET BY MOUTH EVERY DAY     Cardiovascular: Diuretics - Thiazide Passed - 04/28/2024  2:42 PM      Passed - Cr in normal range and within 180 days    Creatinine, Ser  Date Value Ref Range Status  04/09/2024 0.85 0.57 - 1.00 mg/dL Final         Passed - K in normal range and within 180 days    Potassium  Date Value Ref Range Status  04/09/2024 3.8 3.5 - 5.2 mmol/L Final         Passed - Na in normal range and within 180 days    Sodium  Date Value Ref Range Status  04/09/2024 144 134 - 144 mmol/L Final         Passed - Last BP in normal range    BP Readings from Last 1 Encounters:  04/09/24 124/77         Passed - Valid encounter within last 6 months    Recent Outpatient Visits           2 weeks ago Encounter for annual wellness visit (AWV) in Medicare patient   [facility], Stan Eans, MD

## 2024-04-29 ENCOUNTER — Encounter: Payer: HMO | Admitting: Physical Therapy

## 2024-05-13 ENCOUNTER — Other Ambulatory Visit: Payer: Self-pay

## 2024-05-13 MED ORDER — METOPROLOL SUCCINATE ER 25 MG PO TB24: 12.5000 mg | ORAL_TABLET | Freq: Every day | ORAL | 3 refills | Status: AC

## 2024-06-02 ENCOUNTER — Other Ambulatory Visit: Payer: Self-pay | Admitting: Family Medicine

## 2024-06-04 NOTE — Telephone Encounter (Signed)
 Requested Prescriptions  Refused Prescriptions Disp Refills   atorvastatin  (LIPITOR) 40 MG tablet [Pharmacy Med Name: ATORVASTATIN  40 MG TABLET] 90 tablet 1    Sig: TAKE 1 TABLET BY MOUTH EVERY DAY     Cardiovascular:  Antilipid - Statins Failed - 06/04/2024  3:26 PM      Failed - Lipid Panel in normal range within the last 12 months    Cholesterol, Total  Date Value Ref Range Status  04/09/2024 106 100 - 199 mg/dL Final   LDL Chol Calc (NIH)  Date Value Ref Range Status  04/09/2024 33 0 - 99 mg/dL Final   HDL  Date Value Ref Range Status  04/09/2024 56 >39 mg/dL Final   Triglycerides  Date Value Ref Range Status  04/09/2024 87 0 - 149 mg/dL Final         Passed - Patient is not pregnant      Passed - Valid encounter within last 12 months    Recent Outpatient Visits           1 month ago Encounter for annual wellness visit (AWV) in Medicare patient   [facility] [place], Stan Eans, MD

## 2024-06-18 ENCOUNTER — Ambulatory Visit: Payer: Self-pay | Admitting: *Deleted

## 2024-06-18 ENCOUNTER — Ambulatory Visit (INDEPENDENT_AMBULATORY_CARE_PROVIDER_SITE_OTHER): Admitting: Family Medicine

## 2024-06-18 ENCOUNTER — Encounter: Payer: Self-pay | Admitting: Family Medicine

## 2024-06-18 VITALS — BP 138/78 | HR 57 | Ht 63.0 in | Wt 122.3 lb

## 2024-06-18 DIAGNOSIS — R140 Abdominal distension (gaseous): Secondary | ICD-10-CM | POA: Diagnosis not present

## 2024-06-18 DIAGNOSIS — M79671 Pain in right foot: Secondary | ICD-10-CM | POA: Diagnosis not present

## 2024-06-18 DIAGNOSIS — K6289 Other specified diseases of anus and rectum: Secondary | ICD-10-CM

## 2024-06-18 DIAGNOSIS — K644 Residual hemorrhoidal skin tags: Secondary | ICD-10-CM | POA: Diagnosis not present

## 2024-06-18 MED ORDER — ZOLPIDEM TARTRATE 5 MG PO TABS: 2.5000 mg | ORAL_TABLET | Freq: Every evening | ORAL | 5 refills | Status: AC | PRN

## 2024-06-18 NOTE — Telephone Encounter (Signed)
 Copied from CRM [id number]. Topic: Clinical - Red Word Triage >> Jun 18, 2024  9:19 AM Marissa P wrote: Red Word that prompted transfer to Nurse Triage: Patient been having stomach for the past few weeks, feels very heavy not too much pain, does have bowl movement so she is not sure what it is, feels 9 months pregnant. Reason for Disposition  Age > 60 years  Answer Assessment - Initial Assessment Questions 1. LOCATION: Where does it hurt?      I'm so bloated.   I look like I'm 9 months pregnant.   No pain.   I feel heavy.  I'm have normal BMs.   I just feel heavy, full and bloated.   It's near my navel area.   I don't know what is going on.  I'm not having pain 2. RADIATION: Does the pain shoot anywhere else? (e.g., chest, back)     No 3. ONSET: When did the pain begin? (e.g., minutes, hours or days ago)      2-3 weeks ago 4. SUDDEN: Gradual or sudden onset?     Not asked 5. PATTERN Does the pain come and go, or is it constant?    - If it comes and goes: How long does it last? Do you have pain now?     (Note: Comes and goes means the pain is intermittent. It goes away completely between bouts.)    - If constant: Is it getting better, staying the same, or getting worse?      (Note: Constant means the pain never goes away completely; most serious pain is constant and gets worse.)      Constant 6. SEVERITY: How bad is the pain?  (e.g., Scale 1-10; mild, moderate, or severe)    - MILD (1-3): Doesn't interfere with normal activities, abdomen soft and not tender to touch.     - MODERATE (4-7): Interferes with normal activities or awakens from sleep, abdomen tender to touch.     - SEVERE (8-10): Excruciating pain, doubled over, unable to do any normal activities.       No pain but very bloated 7. RECURRENT SYMPTOM: Have you ever had this type of stomach pain before? If Yes, ask: When was the last time? and What happened that time?      Not asked 8. CAUSE: What do you think  is causing the stomach pain?     I have no idea.   9. RELIEVING/AGGRAVATING FACTORS: What makes it better or worse? (e.g., antacids, bending or twisting motion, bowel movement)     Nothing 10. OTHER SYMPTOMS: Do you have any other symptoms? (e.g., back pain, diarrhea, fever, urination pain, vomiting)       No 11. PREGNANCY: Is there any chance you are pregnant? When was your last menstrual period?       N/A due to age  Protocols used: Abdominal Pain - Female-A-AH FYI Only or Action Required?: FYI only for provider.  Patient was last seen in primary care on 04/09/2024 by Myrla Jon HERO, MD. Called Nurse Triage reporting Abdominal Pain. Symptoms began several weeks ago. Interventions attempted: Nothing. Symptoms are: gradually worsening Abd bloating.  No pain  .  Triage Disposition: See Physician Within 24 Hours  Patient/caregiver understands and will follow disposition?: Yes

## 2024-06-18 NOTE — Progress Notes (Signed)
 Acute visit   Patient: Alexis Lyons   DOB: 11-03-1941   83 y.o. Female  MRN: [id number] PCP: Myrla Jon HERO, MD   Chief Complaint  Patient presents with   Bloated    Abd bloating and heavy feeling for 2-3 weeks not too much pain, does have bowl movement so she is not sure what it is, feels 9 months pregnant. She reports when she does have a bowel movement her rectum is sore   Foot Problem    Was placing shepard's hook in ground using foot and she has had to ice ever since so would like to have her right foot evaluated. No bruising, redness or swelling noticed. Taking tylenol  for pain   Medication Refill    Patient would like refill on her Ambien .    Subjective    Discussed the use of AI scribe software for clinical note transcription with the patient, who gave verbal consent to proceed.  History of Present Illness   Alexis Lyons is an 83 year old female who presents with abdominal bloating and foot pain.  She has experienced significant abdominal bloating for the past two weeks, with a sensation of heaviness and distension, particularly at night. Her bowel movements are regular, occurring in the morning after running and drinking water, without straining or hard stools. Soreness is noted after bowel movements, but it is not sharp and does not persist. No blood is observed in her stool. She recently stopped taking a B12 supplement without noticing any change in symptoms. Her diet includes high-fiber foods such as fruits, vegetables, and sweet corn, which she has been consuming more of recently. She consumes milk with her coffee in the morning. A new issue of a foul smell after bowel movements is noted.  She also experiences foot pain, but further details are not provided in the comprehensive HPI.        Review of Systems  Objective    BP 138/78 (BP Location: Left Arm, Patient Position: Sitting, Cuff Size: Normal)   Pulse (!) 57   Ht 5' 3 (1.6 m)   Wt  122 lb 4.8 oz (55.5 kg)   SpO2 100%   BMI 21.66 kg/m  Physical Exam Vitals reviewed.  Constitutional:      General: She is not in acute distress.    Appearance: She is well-developed.  HENT:     Head: Normocephalic and atraumatic.  Eyes:     General: No scleral icterus.    Conjunctiva/sclera: Conjunctivae normal.  Cardiovascular:     Rate and Rhythm: Normal rate and regular rhythm.     Heart sounds: Normal heart sounds.  Pulmonary:     Effort: Pulmonary effort is normal. No respiratory distress.     Breath sounds: Normal breath sounds.  Abdominal:     General: Bowel sounds are normal. There is no distension.     Palpations: Abdomen is soft.     Tenderness: There is no abdominal tenderness. There is no guarding or rebound.  Genitourinary:    Comments: 1 external hemorrhoid, no fissure Skin:    General: Skin is warm and dry.     Findings: No rash.  Neurological:     Mental Status: She is alert and oriented to person, place, and time.  Psychiatric:        Behavior: Behavior normal.       No results found for any visits on 06/18/24.  Assessment & Plan  Problem List Items Addressed This Visit   None Visit Diagnoses       Abdominal distension    -  Primary     Right foot pain         Rectal pain         External hemorrhoid               Abdominal bloating and distension Bloating and distension for two weeks, exacerbated postprandially. No pain, regular bowel movements, no hematochezia. High fiber intake, including fruits, vegetables, and sweet corn, may contribute to symptoms. Discontinuation of B12 showed no improvement. - Discuss low FODMAP diet - Consider simethicone  (Gas-X) for symptom relief - Try yogurt or probiotics - Reduce intake of sweet corn and other high-residue foods  External hemorrhoid Soreness post-defecation, no sharp pain, no hematochezia. External hemorrhoid identified, likely causing discomfort. No fissures present. - Use Preparation H  or witch hazel wipes post-defecation - Consider Tucks wipes for convenience  Right foot contusion (bone bruise) Soreness on the plantar aspect of the right foot following use to pound a shepherd's hook. No swelling, no tenderness on palpation, likely a bone bruise. - Rest and avoid activities that exacerbate pain - Use supportive footwear - Apply ice to the foot at the end of the day - Take acetaminophen  or ibuprofen for pain management - Limit ambulation and avoid high-impact activities for 2-4 weeks         Meds ordered this encounter  Medications   zolpidem  (AMBIEN ) 5 MG tablet    Sig: Take 0.5 tablets (2.5 mg total) by mouth at bedtime as needed for sleep.    Dispense:  15 tablet    Refill:  5    Not to exceed 5 additional fills before 03/10/2024.     Return if symptoms worsen or fail to improve.      Jon Eva, MD  [facility] [phone] (phone) [phone] (fax)  [facility]

## 2024-06-18 NOTE — Telephone Encounter (Signed)
Patient seen this morning

## 2024-07-11 ENCOUNTER — Other Ambulatory Visit: Payer: Self-pay | Admitting: Family Medicine

## 2024-07-13 NOTE — Telephone Encounter (Signed)
 Requested Prescriptions  Pending Prescriptions Disp Refills   atorvastatin  (LIPITOR) 40 MG tablet [Pharmacy Med Name: ATORVASTATIN  40 MG TABLET] 90 tablet 1    Sig: TAKE 1 TABLET BY MOUTH EVERY DAY     Cardiovascular:  Antilipid - Statins Failed - 07/13/2024  3:21 PM      Failed - Lipid Panel in normal range within the last 12 months    Cholesterol, Total  Date Value Ref Range Status  04/09/2024 106 100 - 199 mg/dL Final   LDL Chol Calc (NIH)  Date Value Ref Range Status  04/09/2024 33 0 - 99 mg/dL Final   HDL  Date Value Ref Range Status  04/09/2024 56 >39 mg/dL Final   Triglycerides  Date Value Ref Range Status  04/09/2024 87 0 - 149 mg/dL Final         Passed - Patient is not pregnant      Passed - Valid encounter within last 12 months    Recent Outpatient Visits           3 weeks ago Abdominal distension   [facility] [place], Jon HERO, MD   3 months ago Encounter for annual wellness visit (AWV) in Medicare patient   [facility] [place], Jon HERO, MD

## 2024-08-10 ENCOUNTER — Ambulatory Visit: Payer: Self-pay

## 2024-08-10 NOTE — Telephone Encounter (Signed)
 FYI Only or Action Required?: FYI only for provider.  Patient was last seen in primary care on 06/18/2024 by Myrla Jon HERO, MD.  Called Nurse Triage reporting Foot Injury and brain fog  Symptoms began several days ago.  Interventions attempted: Nothing. Has iced foot, but continues to walk daily - eating seem to help dizziness.   Symptoms are: gradually worsening.  Triage Disposition: See Physician Within 24 Hours  Patient/caregiver understands and will follow disposition?: Yes                Copied from CRM #[id number]. Topic: Clinical - Red Word Triage >> Aug 10, 2024  8:18 AM Willma R wrote: Red Word that prompted transfer to Nurse Triage: Patient thinks she hurt her right foot in June pounding a stick into the ground. Seen the doctor and stated nothing was broken. Since then it has gotten worse and now along with the pain under her foot in the arch she has numbness from her ankle down. Also for the past week has had some dizziness. Reason for Disposition  [1] MODERATE dizziness (e.g., interferes with normal activities) AND [2] has NOT been evaluated by doctor (or NP/PA) for this  (Exception: Dizziness caused by heat exposure, sudden standing, or poor fluid intake.)  Answer Assessment - Initial Assessment Questions 1. DESCRIPTION: Describe your dizziness.     Needed to lay down over the weekend - Foggy headed 2. LIGHTHEADED: Do you feel lightheaded? (e.g., somewhat faint, woozy, weak upon standing)     Fog in head - staring to see signs on the road 3. VERTIGO: Do you feel like either you or the room is spinning or tilting? (i.e., vertigo)     no 4. SEVERITY: How bad is it?  Do you feel like you are going to faint? Can you stand and walk?     Had to come home 5. ONSET:  When did the dizziness begin?     This week 6. AGGRAVATING FACTORS: Does anything make it worse? (e.g., standing, change in head position)     Unsure - got better with eating 7.  HEART RATE: Can you tell me your heart rate? How many beats in 15 seconds?  (Note: Not all patients can do this.)       60 8. CAUSE: What do you think is causing the dizziness? (e.g., decreased fluids or food, diarrhea, emotional distress, heat exposure, new medicine, sudden standing, vomiting; unknown)     unsure 9. RECURRENT SYMPTOM: Have you had dizziness before? If Yes, ask: When was the last time? What happened that time?     Over the weekend 10. OTHER SYMPTOMS: Do you have any other symptoms? (e.g., fever, chest pain, vomiting, diarrhea, bleeding)       Foot pain  Protocols used: Dizziness - Lightheadedness-A-AH

## 2024-08-10 NOTE — Telephone Encounter (Signed)
 Noted

## 2024-08-11 ENCOUNTER — Encounter: Payer: Self-pay | Admitting: Family Medicine

## 2024-08-11 ENCOUNTER — Ambulatory Visit (INDEPENDENT_AMBULATORY_CARE_PROVIDER_SITE_OTHER): Admitting: Family Medicine

## 2024-08-11 VITALS — BP 122/70 | HR 60 | Temp 97.7°F | Ht 63.0 in | Wt 123.4 lb

## 2024-08-11 DIAGNOSIS — G629 Polyneuropathy, unspecified: Secondary | ICD-10-CM | POA: Diagnosis not present

## 2024-08-11 DIAGNOSIS — M79671 Pain in right foot: Secondary | ICD-10-CM

## 2024-08-11 DIAGNOSIS — R42 Dizziness and giddiness: Secondary | ICD-10-CM

## 2024-08-11 NOTE — Progress Notes (Signed)
 Acute Office Visit  Subjective:     Patient ID: Alexis Lyons, female    DOB: 1941-08-19, 83 y.o.   MRN: [id number]  Chief Complaint  Patient presents with   Acute Visit    Within the last week her head feels like she is in a cloud or fog, not sure what is going on and seems like a couple of the instances she ate and then felt fine.  No headache when feeling this way.  Also she does have foot pain an numbness on the right one, has not got any better.  All time now and radiating from her ankle down to her toes.      Alexis Lyons is a 83 yo F with a history of Afib, prediabetes, and neuropathy who presents to the clinic acutely for foot pain and numbness as well as lightheadedness.  On interview, she reports continuing to experience pain on the plantar aspect of her right foot. She endorses previously injuring the bottom of her foot while trying to pound a stake into the ground. She was seen previously in the clinic for the pain. She reports that rest, ice, and foot support have not helped alleviate the pain. She continues to experience pain in the middle of her foot only while walking. She denies pain at rest, redness, or swelling. She endorses full function of her foot currently.  Additionally, she reports numbness in her right foot. She reports having a history of neuropathy in the past, but states that the numbness is now constant and up to her ankles. She denies any changes in her strength or sensation. She denies any redness or swelling.  Lastly, she reports a one week history of lightheadedness. She states that she experiences the lightheadedness at rest. She often feels fatigued or tired during these periods. She denies any associated dizziness or falls. She reports that her blood pressure measurements are normal during these episodes. Eating helps quickly resolve them. She denies any other symptoms associated with her lightheadedness.  She reports no other concerns  today.     ROS      Objective:    BP 122/70   Pulse 60   Temp 97.7 F (36.5 C) (Oral)   Ht 5' 3 (1.6 m)   Wt 123 lb 6.4 oz (56 kg)   SpO2 98%   BMI 21.86 kg/m    Physical Exam Vitals reviewed.  Constitutional:      General: She is not in acute distress.    Appearance: Normal appearance. She is not ill-appearing or diaphoretic.  HENT:     Head: Normocephalic.     Right Ear: External ear normal.     Left Ear: External ear normal.     Nose: Nose normal.     Mouth/Throat:     Mouth: Mucous membranes are moist.  Eyes:     General: No scleral icterus.    Conjunctiva/sclera: Conjunctivae normal.  Cardiovascular:     Rate and Rhythm: Normal rate and regular rhythm.     Pulses: Normal pulses.     Heart sounds: Normal heart sounds. No murmur heard.    No friction rub. No gallop.  Pulmonary:     Effort: Pulmonary effort is normal. No respiratory distress.     Breath sounds: Normal breath sounds. No wheezing.  Abdominal:     General: There is no distension.     Palpations: Abdomen is soft.     Tenderness: There is no abdominal tenderness. There is  no guarding.  Musculoskeletal:     Cervical back: Normal range of motion.     Right lower leg: No edema.     Left lower leg: No edema.  Skin:    General: Skin is warm and dry.     Capillary Refill: Capillary refill takes less than 2 seconds.  Neurological:     Mental Status: She is alert and oriented to person, place, and time.  Psychiatric:        Mood and Affect: Mood normal.     No results found for any visits on 08/11/24.      Assessment & Plan:   Problem List Items Addressed This Visit     Neuropathy - Primary   Currently not managed. Chronic history of neuropathy in the right foot. Previously seen by Neuro who diagnosed with mild neuropathy. Still able to ambulate and use foot with only numbness at rest. States that numbness used to be episodic, but now more constant and extending towards the ankle. Pulses  intact and foot with normal appearance. Currently taking B12. - Order B12 - Amb ref to Neuro      Relevant Orders   B12   Ambulatory referral to Neurology   Other Visit Diagnoses       Right foot pain       Relevant Orders   Ambulatory referral to Podiatry     Intermittent lightheadedness       Relevant Orders   TSH      Right foot pain Continues to experience soreness on the plantar aspect of her right foot after pounding a stake into the ground. Currently experiences pain only when walking, but no signs of warmth, redness, or edema. Previous trial of rest, ice, and supportive footwear did not help according to patient. Differential includes bone bruise vs tendon strain in the plantar aspect. - Continue to ice and stretch foot to help with pain -  Amb ref to podiatry  Intermittent lightheadedness Patient presents with a one week history of lightheadedness that resolves upon eating. She denies any low blood pressure readings, dizziness, or falls during these episodes. She reports only feeling fatigue and tired, usually in the late morning when these episodes occur. No history of diabetes. Most likely etiology is hypoglycemia from insufficient caloric intake. - Discussed eating snacks and hydrating well in between meals to prevent further recurrence of episodes  - Order TSH - Return if symptoms do not improve or worsen   No orders of the defined types were placed in this encounter.   Return in about 2 months (around 10/11/2024) for as scheduled.  Elia LULLA Blanch, Medical Student   Patient seen along with MS3 student, Elia Blanch. I personally evaluated this patient along with the student, and verified all aspects of the history, physical exam, and medical decision making as documented by the student. I agree with the student's documentation and have made all necessary edits.  Jevin Camino, Jon HERO, MD, MPH [facility]

## 2024-08-11 NOTE — Assessment & Plan Note (Addendum)
 Currently not managed. Chronic history of neuropathy in the right foot. Previously seen by Neuro who diagnosed with mild neuropathy. Still able to ambulate and use foot with only numbness at rest. States that numbness used to be episodic, but now more constant and extending towards the ankle. Pulses intact and foot with normal appearance. Currently taking B12. - Order B12 - Amb ref to Neuro

## 2024-08-12 DIAGNOSIS — R42 Dizziness and giddiness: Secondary | ICD-10-CM | POA: Diagnosis not present

## 2024-08-12 DIAGNOSIS — G629 Polyneuropathy, unspecified: Secondary | ICD-10-CM | POA: Diagnosis not present

## 2024-08-13 LAB — TSH: TSH: 2.24 u[IU]/mL (ref 0.450–4.500)

## 2024-08-13 LAB — VITAMIN B12: Vitamin B-12: 762 pg/mL (ref 232–1245)

## 2024-08-14 ENCOUNTER — Emergency Department

## 2024-08-14 ENCOUNTER — Emergency Department
Admission: EM | Admit: 2024-08-14 | Discharge: 2024-08-14 | Disposition: A | Discharge status: Home / Self Care | Attending: Emergency Medicine | Admitting: Emergency Medicine

## 2024-08-14 ENCOUNTER — Other Ambulatory Visit: Payer: Self-pay

## 2024-08-14 ENCOUNTER — Ambulatory Visit: Payer: Self-pay | Admitting: Family Medicine

## 2024-08-14 DIAGNOSIS — R0789 Other chest pain: Secondary | ICD-10-CM | POA: Diagnosis not present

## 2024-08-14 DIAGNOSIS — R42 Dizziness and giddiness: Secondary | ICD-10-CM | POA: Diagnosis not present

## 2024-08-14 DIAGNOSIS — R5383 Other fatigue: Secondary | ICD-10-CM | POA: Diagnosis not present

## 2024-08-14 LAB — COMPREHENSIVE METABOLIC PANEL WITH GFR
ALT: 29 U/L (ref 0–44)
AST: 26 U/L (ref 15–41)
Albumin: 4.1 g/dL (ref 3.5–5.0)
Alkaline Phosphatase: 60 U/L (ref 38–126)
Anion gap: 8 (ref 5–15)
BUN: 16 mg/dL (ref 8–23)
CO2: 27 mmol/L (ref 22–32)
Calcium: 9.5 mg/dL (ref 8.9–10.3)
Chloride: 104 mmol/L (ref 98–111)
Creatinine, Ser: 0.68 mg/dL (ref 0.44–1.00)
GFR, Estimated: >60 mL/min (ref >60)
Glucose, Bld: 120 mg/dL — ABNORMAL HIGH (ref 70–99)
Potassium: 3.8 mmol/L (ref 3.5–5.1)
Sodium: 139 mmol/L (ref 135–145)
Total Bilirubin: 1 mg/dL (ref 0.0–1.2)
Total Protein: 6.6 g/dL (ref 6.5–8.1)

## 2024-08-14 LAB — URINALYSIS, COMPLETE (UACMP) WITH MICROSCOPIC
Bacteria, UA: NONE SEEN
Bilirubin Urine: NEGATIVE
Glucose, UA: NEGATIVE mg/dL
Hgb urine dipstick: NEGATIVE
Ketones, ur: NEGATIVE mg/dL
Leukocytes,Ua: NEGATIVE
Nitrite: NEGATIVE
Protein, ur: NEGATIVE mg/dL
Specific Gravity, Urine: 1.008 (ref 1.005–1.030)
Squamous Epithelial / HPF: 0 /HPF (ref 0–5)
pH: 5 (ref 5.0–8.0)

## 2024-08-14 LAB — RESP PANEL BY RT-PCR (RSV, FLU A&B, COVID)  RVPGX2
Influenza A by PCR: NEGATIVE
Influenza B by PCR: NEGATIVE
Resp Syncytial Virus by PCR: NEGATIVE
SARS Coronavirus 2 by RT PCR: NEGATIVE

## 2024-08-14 LAB — CBC
HCT: 43.4 % (ref 36.0–46.0)
Hemoglobin: 14.1 g/dL (ref 12.0–15.0)
MCH: 30.1 pg (ref 26.0–34.0)
MCHC: 32.5 g/dL (ref 30.0–36.0)
MCV: 92.7 fL (ref 80.0–100.0)
Platelets: 215 K/uL (ref 150–400)
RBC: 4.68 MIL/uL (ref 3.87–5.11)
RDW: 12.3 % (ref 11.5–15.5)
WBC: 5 K/uL (ref 4.0–10.5)
nRBC: 0 % (ref 0.0–0.2)

## 2024-08-14 LAB — TROPONIN I (HIGH SENSITIVITY)
Troponin I (High Sensitivity): 4 ng/L (ref <18)
Troponin I (High Sensitivity): 5 ng/L (ref <18)

## 2024-08-14 MED ORDER — SODIUM CHLORIDE 0.9 % IV BOLUS
1000.0000 mL | Freq: Once | INTRAVENOUS | Status: AC
  Administered 2024-08-14: 1000 mL via INTRAVENOUS

## 2024-08-14 NOTE — ED Triage Notes (Signed)
 Pt to ED via POV from driving. Pt reports intermittent dizziness for over a week. Pt reports was driving and started to have dizziness and feeling unwell and went home. Pt reports started to feel an ache in her heart and eyes feel tired and tightness in face and around eyes. Pt reports has been waking up with her head hurting. Denies SOB. Denies pain on arrival.

## 2024-08-14 NOTE — ED Notes (Signed)
 I called lab and requested that they run the 1st troponin due to it not showing completed yet.

## 2024-08-14 NOTE — Discharge Instructions (Signed)
 Please follow-up with your primary care doctor for ongoing workup and treatment.  Return to the emergency department for any worsening symptoms or any other symptom acutely concerning to yourself.

## 2024-08-14 NOTE — ED Provider Notes (Signed)
 [facility] Provider Note    Event Date/Time   First MD Initiated Contact with Patient 08/14/24 1232     (approximate)  History   Chief Complaint: Dizziness  HPI  Alexis Lyons is a 83 y.o. female with a past medical history of atrial fibrillation, gastric reflux, TIA, presents to the emergency department for intermittent dizziness and some chest discomfort.  According to the patient over the past several weeks she has been experiencing intermittent dizziness sensation which she describes more as a lightheadedness and occasional head tightness and tightness around her eyes.  Patient states her main complaint however is fatigue she states by around 3 or 4:00 in the afternoon she is wiped out and very fatigued.  She states this has been going on for at least the past week and a half.  She saw her doctor for the same earlier this week and said all the results were normal.  Patient denies any fever cough congestion.  Denies any urinary symptoms.   Patient denies any weakness or numbness of any arm or leg or confusion.  Patient states earlier this morning she was having some chest tightness as well.  Physical Exam   Triage Vital Signs: ED Triage Vitals  Encounter Vitals Group     BP 08/14/24 1222 (!) 182/96     Girls Systolic BP Percentile --      Girls Diastolic BP Percentile --      Boys Systolic BP Percentile --      Boys Diastolic BP Percentile --      Pulse Rate 08/14/24 1222 68     Resp 08/14/24 1222 18     Temp 08/14/24 1223 97.7 F (36.5 C)     Temp Source 08/14/24 1223 Oral     SpO2 08/14/24 1222 98 %     Weight --      Height --      Head Circumference --      Peak Flow --      Pain Score 08/14/24 1223 0     Pain Loc --      Pain Education --      Exclude from Growth Chart --     Most recent vital signs: Vitals:   08/14/24 1222 08/14/24 1223  BP: (!) 182/96   Pulse: 68   Resp: 18   Temp:  97.7 F (36.5 C)  SpO2: 98%      General: Awake, no distress.  CV:  Good peripheral perfusion.  Regular rate and rhythm  Resp:  Normal effort.  Equal breath sounds bilaterally.  Abd:  No distention.  Soft, nontender.  No rebound or guarding. Other:  Equal grip strength bilaterally without pronator drift.  5/5 motor in all extremities.  No cranial nerve deficits.   ED Results / Procedures / Treatments   EKG  EKG viewed and interpreted by myself shows a normal sinus rhythm at 71 bpm with a narrow QRS, left axis deviation, largely normal intervals with no concerning ST changes.  RADIOLOGY  I have reviewed and interpreted the CT images of the head.  No obvious bleed or significant abnormality seen on my evaluation. Radiology is read the CT scan as normal for age.   MEDICATIONS ORDERED IN ED: Medications  sodium chloride  0.9 % bolus 1,000 mL (1,000 mLs Intravenous New Bag/Given 08/14/24 1303)     IMPRESSION / MDM / ASSESSMENT AND PLAN / ED COURSE  I reviewed the triage vital signs and the nursing  notes.  Patient's presentation is most consistent with acute presentation with potential threat to life or bodily function.  Patient presents to the emergency department with symptoms of intermittent dizziness which she describes more as lightheadedness along with episode of chest tightness this morning.  Overall the patient appears well, no distress.  Reassuring physical exam, reassuring vital signs besides hypertension.  Reassuring neurological exam.  However given the patient's symptoms we will check labs including a CBC chemistry and troponin.  Will obtain a respiratory panel.  Given her intermittent dizziness we will obtain a CT scan of the head.  Will IV hydrate and continue to closely monitor.  Patient agreeable to plan of care and workup.  Patient's workup is reassuring.  CBC is normal, chemistry is normal.  Urinalysis is normal.  Respiratory panel is normal.  Troponin has resulted normal.  CT scan of the head shows  no concerning findings.  She has received IV fluids.  Overall patient appears well, no distress.  Her workup is reassuring but does not provide an answer for the patient's ongoing fatigue.  Patient to follow-up with her doctor for further evaluation.  FINAL CLINICAL IMPRESSION(S) / ED DIAGNOSES   Dizziness Fatigue   Note:  This document was prepared using Dragon voice recognition software and may include unintentional dictation errors.   Dorothyann Drivers, MD 08/14/24 1524

## 2024-08-26 ENCOUNTER — Other Ambulatory Visit: Payer: Self-pay | Admitting: Cardiology

## 2024-08-26 ENCOUNTER — Ambulatory Visit (INDEPENDENT_AMBULATORY_CARE_PROVIDER_SITE_OTHER)

## 2024-08-26 ENCOUNTER — Ambulatory Visit: Admitting: Podiatry

## 2024-08-26 VITALS — Ht 63.0 in | Wt 123.4 lb

## 2024-08-26 DIAGNOSIS — S93699A Other sprain of unspecified foot, initial encounter: Secondary | ICD-10-CM | POA: Diagnosis not present

## 2024-08-26 DIAGNOSIS — M7751 Other enthesopathy of right foot: Secondary | ICD-10-CM | POA: Diagnosis not present

## 2024-08-26 DIAGNOSIS — I480 Paroxysmal atrial fibrillation: Secondary | ICD-10-CM

## 2024-08-26 NOTE — Patient Instructions (Addendum)
 VISIT SUMMARY: Today, you were seen for foot pain and numbness following a traumatic injury in April. Your primary doctor initially evaluated your foot and did not suspect any fractures. You have been using castor oil to manage the pain, which has improved, but numbness persists. We discussed your symptoms and reviewed your treatment plan.  YOUR PLAN: -RIGHT PLANTAR FASCIITIS: Plantar fasciitis is inflammation of the tissue along the bottom of your foot. You should continue using castor oil if it helps, apply Voltaren gel 3-4 times daily, and perform home therapy exercises. Gradually increase your walking distance, starting at half your normal distance and increasing by 50% weekly. If home exercises are not successful, we may consider referring you to physical therapy.  -RIGHT FOOT AND ANKLE OSTEOARTHRITIS: Osteoarthritis is a type of arthritis that occurs when the protective cartilage that cushions the ends of your bones wears down over time. Your radiographs show arthritic changes, but you are currently not experiencing symptoms.   INSTRUCTIONS: You can expect some soreness for another month or two, with full resolution anticipated by November. Please follow up if your symptoms worsen or do not resolve by then.              Contains text generated by [employer].    Plantar Fasciitis (Heel Spur Syndrome) with Rehab The plantar fascia is a fibrous, ligament-like, soft-tissue structure that spans the bottom of the foot. Plantar fasciitis is a condition that causes pain in the foot due to inflammation of the tissue. SYMPTOMS  Pain and tenderness on the underneath side of the foot. Pain that worsens with standing or walking. CAUSES  Plantar fasciitis is caused by irritation and injury to the plantar fascia on the underneath side of the foot. Common mechanisms of injury include: Direct trauma to bottom of the foot. Damage to a small nerve that runs under the foot where the main fascia  attaches to the heel bone. Stress placed on the plantar fascia due to bone spurs. RISK INCREASES WITH:  Activities that place stress on the plantar fascia (running, jumping, pivoting, or cutting). Poor strength and flexibility. Improperly fitted shoes. Tight calf muscles. Flat feet. Failure to warm-up properly before activity. Obesity. PREVENTION Warm up and stretch properly before activity. Allow for adequate recovery between workouts. Maintain physical fitness: Strength, flexibility, and endurance. Cardiovascular fitness. Maintain a health body weight. Avoid stress on the plantar fascia. Wear properly fitted shoes, including arch supports for individuals who have flat feet.  PROGNOSIS  If treated properly, then the symptoms of plantar fasciitis usually resolve without surgery. However, occasionally surgery is necessary.  RELATED COMPLICATIONS  Recurrent symptoms that may result in a chronic condition. Problems of the lower back that are caused by compensating for the injury, such as limping. Pain or weakness of the foot during push-off following surgery. Chronic inflammation, scarring, and partial or complete fascia tear, occurring more often from repeated injections.  TREATMENT  Treatment initially involves the use of ice and medication to help reduce pain and inflammation. The use of strengthening and stretching exercises may help reduce pain with activity, especially stretches of the Achilles tendon. These exercises may be performed at home or with a therapist. Your caregiver may recommend that you use heel cups of arch supports to help reduce stress on the plantar fascia. Occasionally, corticosteroid injections are given to reduce inflammation. If symptoms persist for greater than 6 months despite non-surgical (conservative), then surgery may be recommended.   MEDICATION  If pain medication is necessary, then  nonsteroidal anti-inflammatory medications, such as aspirin  and  ibuprofen, or other minor pain relievers, such as acetaminophen , are often recommended. Do not take pain medication within 7 days before surgery. Prescription pain relievers may be given if deemed necessary by your caregiver. Use only as directed and only as much as you need. Corticosteroid injections may be given by your caregiver. These injections should be reserved for the most serious cases, because they may only be given a certain number of times.  HEAT AND COLD Cold treatment (icing) relieves pain and reduces inflammation. Cold treatment should be applied for 10 to 15 minutes every 2 to 3 hours for inflammation and pain and immediately after any activity that aggravates your symptoms. Use ice packs or massage the area with a piece of ice (ice massage). Heat treatment may be used prior to performing the stretching and strengthening activities prescribed by your caregiver, physical therapist, or athletic trainer. Use a heat pack or soak the injury in warm water.  SEEK IMMEDIATE MEDICAL CARE IF: Treatment seems to offer no benefit, or the condition worsens. Any medications produce adverse side effects.  EXERCISES- RANGE OF MOTION (ROM) AND STRETCHING EXERCISES - Plantar Fasciitis (Heel Spur Syndrome) These exercises may help you when beginning to rehabilitate your injury. Your symptoms may resolve with or without further involvement from your physician, physical therapist or athletic trainer. While completing these exercises, remember:  Restoring tissue flexibility helps normal motion to return to the joints. This allows healthier, less painful movement and activity. An effective stretch should be held for at least 30 seconds. A stretch should never be painful. You should only feel a gentle lengthening or release in the stretched tissue.  RANGE OF MOTION - Toe Extension, Flexion Sit with your right / left leg crossed over your opposite knee. Grasp your toes and gently pull them back toward  the top of your foot. You should feel a stretch on the bottom of your toes and/or foot. Hold this stretch for 10 seconds. Now, gently pull your toes toward the bottom of your foot. You should feel a stretch on the top of your toes and or foot. Hold this stretch for 10 seconds. Repeat  times. Complete this stretch 3 times per day.   RANGE OF MOTION - Ankle Dorsiflexion, Active Assisted Remove shoes and sit on a chair that is preferably not on a carpeted surface. Place right / left foot under knee. Extend your opposite leg for support. Keeping your heel down, slide your right / left foot back toward the chair until you feel a stretch at your ankle or calf. If you do not feel a stretch, slide your bottom forward to the edge of the chair, while still keeping your heel down. Hold this stretch for 10 seconds. Repeat 3 times. Complete this stretch 2 times per day.   STRETCH  Gastroc, Standing Place hands on wall. Extend right / left leg, keeping the front knee somewhat bent. Slightly point your toes inward on your back foot. Keeping your right / left heel on the floor and your knee straight, shift your weight toward the wall, not allowing your back to arch. You should feel a gentle stretch in the right / left calf. Hold this position for 10 seconds. Repeat 3 times. Complete this stretch 2 times per day.  STRETCH  Soleus, Standing Place hands on wall. Extend right / left leg, keeping the other knee somewhat bent. Slightly point your toes inward on your back foot. Keep your right /  left heel on the floor, bend your back knee, and slightly shift your weight over the back leg so that you feel a gentle stretch deep in your back calf. Hold this position for 10 seconds. Repeat 3 times. Complete this stretch 2 times per day.  STRETCH  Gastrocsoleus, Standing  Note: This exercise can place a lot of stress on your foot and ankle. Please complete this exercise only if specifically instructed by your  caregiver.  Place the ball of your right / left foot on a step, keeping your other foot firmly on the same step. Hold on to the wall or a rail for balance. Slowly lift your other foot, allowing your body weight to press your heel down over the edge of the step. You should feel a stretch in your right / left calf. Hold this position for 10 seconds. Repeat this exercise with a slight bend in your right / left knee. Repeat 3 times. Complete this stretch 2 times per day.   STRENGTHENING EXERCISES - Plantar Fasciitis (Heel Spur Syndrome)  These exercises may help you when beginning to rehabilitate your injury. They may resolve your symptoms with or without further involvement from your physician, physical therapist or athletic trainer. While completing these exercises, remember:  Muscles can gain both the endurance and the strength needed for everyday activities through controlled exercises. Complete these exercises as instructed by your physician, physical therapist or athletic trainer. Progress the resistance and repetitions only as guided.  STRENGTH - Towel Curls Sit in a chair positioned on a non-carpeted surface. Place your foot on a towel, keeping your heel on the floor. Pull the towel toward your heel by only curling your toes. Keep your heel on the floor. Repeat 3 times. Complete this exercise 2 times per day.  STRENGTH - Ankle Inversion Secure one end of a rubber exercise band/tubing to a fixed object (table, pole). Loop the other end around your foot just before your toes. Place your fists between your knees. This will focus your strengthening at your ankle. Slowly, pull your big toe up and in, making sure the band/tubing is positioned to resist the entire motion. Hold this position for 10 seconds. Have your muscles resist the band/tubing as it slowly pulls your foot back to the starting position. Repeat 3 times. Complete this exercises 2 times per day.  Document Released: 12/03/2005  Document Revised: 02/25/2012 Document Reviewed: 03/17/2009 [facility] Patient Information 2014 [place], [place].

## 2024-08-27 ENCOUNTER — Other Ambulatory Visit: Payer: Self-pay | Admitting: Family Medicine

## 2024-08-27 DIAGNOSIS — I480 Paroxysmal atrial fibrillation: Secondary | ICD-10-CM

## 2024-08-27 NOTE — Telephone Encounter (Unsigned)
 Copied from CRM [id number]. Topic: Clinical - Medication Refill >> Aug 27, 2024  8:14 AM Mia F wrote: Medication: ELIQUIS  2.5 MG TABS tablet  *PT WILL BE GOING ON VACATION TOMORROW MORNING AND WILL RUN OUT OF MEDICATION*  Has the patient contacted their pharmacy? Yes (Agent: If no, request that the patient contact the pharmacy for the refill. If patient does not wish to contact the pharmacy document the reason why and proceed with request.) (Agent: If yes, when and what did the pharmacy advise?)  This is the patient's preferred pharmacy:  [id number] [address] [place] - [address] [place] [address] Phone: [phone] Fax: [phone]    Is this the correct pharmacy for this prescription? Yes If no, delete pharmacy and type the correct one.   Has the prescription been filled recently? Yes  Is the patient out of the medication? No  Has the patient been seen for an appointment in the last year OR does the patient have an upcoming appointment? Yes  Can we respond through MyChart? Yes  Agent: Please be advised that Rx refills may take up to 3 business days. We ask that you follow-up with your pharmacy.

## 2024-08-27 NOTE — Progress Notes (Signed)
 Subjective:  Patient ID: Alexis Lyons, female    DOB: July 20, 1941,  MRN: [id number]  Chief Complaint  Patient presents with   Foot Pain    Rm 1 Patient is here for right heel and arch pain. Pt states using castor oil topically on soles of feet (anti-inflammatory) for minor pain relief.    Discussed the use of AI scribe software for clinical note transcription with the patient, who gave verbal consent to proceed.  History of Present Illness Alexis Lyons is an 83 year old female who presents with foot pain and numbness following a traumatic injury.  She has been experiencing foot pain and numbness since April after using her foot to push a shepherd hook into the ground. Initially, the pain was severe with inflammation from the arch to the heel. Application of castor oil alleviated the pain, but numbness developed, extending to her toes.  The numbness has improved but persists, particularly affecting her ability to walk, which is a regular activity for her. Walking causes soreness, although the pain is not as intense as before. She continues to walk her usual distances, albeit with some discomfort.  Her primary doctor initially evaluated her foot and did not suspect any fractures, although there was swelling at the time of injury. No bruising or previous issues with her foot prior to this incident.  She has been using castor oil regularly, which she feels has been effective in managing the pain.  She has not experienced any severe pain that would prevent her from continuing her walking routine.      Objective:    Physical Exam VASCULAR: DP and PT pulse palpable. Foot is warm and well-perfused. Capillary fill time is brisk. DERMATOLOGIC: Normal skin turgor, texture, and temperature. No open lesions, rashes, or ulcerations. NEUROLOGIC: Normal sensation to light touch and pressure. No paresthesias. ORTHOPEDIC: Hallux valgus deformity with medial bunion and prominent  navicular tuberosity. Mild soreness on lateral band of plantar fascia, none in midsubstance medially or plantar heel.  Smooth pain-free range of motion of all examined joints. No ecchymosis or bruising. No gross deformity. No pain to palpation.   No images are attached to the encounter.    Results RADIOLOGY Right foot radiographs: Moderate to severe hallux valgus deformity, arthritic changes in the forefoot and midfoot, prominence of the navicular tuberosity (08/26/2024)   Assessment:   1. Traumatic plantar fasciitis      Plan:  Patient was evaluated and treated and all questions answered.  Assessment and Plan Assessment & Plan Right plantar fasciitis Pain and inflammation in the right foot from the arch to the heel following trauma in April. Pain improved with castor oil, but numbness persists. Examination shows mild soreness on the lateral band of the plantar fascia, likely due to a deep bruise and inflammation from the traumatic event. Recovery is progressing, and steroid injection is not needed. - Provide home therapy exercises for plantar fasciitis - Recommend Voltaren gel application 3-4 times daily - Continue castor oil application if desired - Advise gradual increase in walking distance, starting at half the normal distance and increasing by 50% weekly - Consider referral to physical therapy if home exercises are not successful  Right foot and ankle osteoarthritis Radiographs show arthritic changes in the forefoot and midfoot, and currently asymptomatic.  Right hallux valgus (bunion) Moderate to severe hallux valgus deformity with a medial bunion noted on physical examination and radiographs.  Right navicular tuberosity prominence Radiographs show prominence of the navicular tuberosity, not  addressed as a primary concern during the visit.  Follow-up Expected soreness for another month or two, with full resolution anticipated by November. - Advise follow-up if symptoms  worsen or do not resolve by November      Return if symptoms worsen or fail to improve.

## 2024-08-27 NOTE — Telephone Encounter (Signed)
 Prescription refill request for Eliquis  received. Indication:afib Last office visit:12/24 Scr:0.68  8/25 Age: 83 Weight:56  kg  Prescription refilled

## 2024-09-21 ENCOUNTER — Ambulatory Visit
Admission: RE | Admit: 2024-09-21 | Discharge: 2024-09-21 | Disposition: A | Discharge status: Home / Self Care | Source: Ambulatory Visit | Attending: Family Medicine | Admitting: Family Medicine

## 2024-09-21 DIAGNOSIS — Z1231 Encounter for screening mammogram for malignant neoplasm of breast: Secondary | ICD-10-CM | POA: Diagnosis not present

## 2024-09-24 ENCOUNTER — Ambulatory Visit: Payer: Self-pay | Admitting: Family Medicine

## 2024-10-13 ENCOUNTER — Ambulatory Visit: Admitting: Family Medicine

## 2024-10-13 ENCOUNTER — Encounter: Payer: Self-pay | Admitting: Family Medicine

## 2024-10-13 VITALS — BP 124/83 | HR 67 | Ht 63.0 in | Wt 122.1 lb

## 2024-10-13 DIAGNOSIS — F5102 Adjustment insomnia: Secondary | ICD-10-CM | POA: Diagnosis not present

## 2024-10-13 DIAGNOSIS — M25512 Pain in left shoulder: Secondary | ICD-10-CM

## 2024-10-13 DIAGNOSIS — I10 Essential (primary) hypertension: Secondary | ICD-10-CM

## 2024-10-13 DIAGNOSIS — E7800 Pure hypercholesterolemia, unspecified: Secondary | ICD-10-CM | POA: Diagnosis not present

## 2024-10-13 DIAGNOSIS — I480 Paroxysmal atrial fibrillation: Secondary | ICD-10-CM | POA: Diagnosis not present

## 2024-10-13 DIAGNOSIS — Z8673 Personal history of transient ischemic attack (TIA), and cerebral infarction without residual deficits: Secondary | ICD-10-CM | POA: Diagnosis not present

## 2024-10-13 NOTE — Assessment & Plan Note (Signed)
Well controlled Continue current medications Reviewed metabolic panel F/u in 6 months  

## 2024-10-13 NOTE — Progress Notes (Signed)
 Established patient visit   Patient: Alexis Lyons   DOB: August 20, 1941   83 y.o. Female  MRN: [id number] Visit Date: 10/13/2024  Today's healthcare provider: Jon Eva, MD   Chief Complaint  Patient presents with   Medical Management of Chronic Issues   Hypertension   Hyperlipidemia   Subjective    Hypertension  Hyperlipidemia     Discussed the use of AI scribe software for clinical note transcription with the patient, who gave verbal consent to proceed.  History of Present Illness   Alexis Lyons is an 83 year old female with hypertension, paroxysmal atrial fibrillation, and hyperlipidemia who presents for a six-month follow-up.  She is satisfied with her current blood pressure readings, which have improved. Her medications include atorvastatin  40 mg daily, Eliquis  2.5 mg twice daily, hydrochlorothiazide  12.5 mg daily, and metoprolol  12.5 mg daily. She uses Ambien  as needed for insomnia.  She experienced left upper back pain after trying to catch her great-granddaughter 2-3 weeks ago. The pain is intermittent, and she applies ice for relief. She is considering physical therapy, which has been beneficial in the past.  She recalls a previous issue with her right foot involving stomping and numbness, which resolved after an x-ray showed arthritic changes and a bunion. She was referred to Dr. Lane.  She had a past episode of chest pain leading to an ER visit in August, with normal labs including troponins, kidney, and liver function tests.         Medications: Outpatient Medications Prior to Visit  Medication Sig   atorvastatin  (LIPITOR) 40 MG tablet TAKE 1 TABLET BY MOUTH EVERY DAY   conjugated estrogens  (PREMARIN ) vaginal cream Place 1 Applicatorful vaginally 2 (two) times a week.   Cyanocobalamin (B-12 PO) Take by mouth daily.   ELIQUIS  2.5 MG TABS tablet TAKE 1 TABLET BY MOUTH TWICE A DAY   hydrochlorothiazide  (HYDRODIURIL ) 12.5 MG tablet  TAKE 1 TABLET BY MOUTH EVERY DAY   metoprolol  succinate (TOPROL  XL) 25 MG 24 hr tablet Take 0.5 tablets (12.5 mg total) by mouth daily.   zolpidem  (AMBIEN ) 5 MG tablet Take 0.5 tablets (2.5 mg total) by mouth at bedtime as needed for sleep.   No facility-administered medications prior to visit.    Review of Systems     Objective    BP 124/83 (BP Location: Left Arm, Patient Position: Sitting, Cuff Size: Normal)   Pulse 67   Ht 5' 3 (1.6 m)   Wt 122 lb 1.6 oz (55.4 kg)   SpO2 98%   BMI 21.63 kg/m    Physical Exam Vitals reviewed.  Constitutional:      General: She is not in acute distress.    Appearance: Normal appearance. She is well-developed. She is not diaphoretic.  HENT:     Head: Normocephalic and atraumatic.  Eyes:     General: No scleral icterus.    Conjunctiva/sclera: Conjunctivae normal.  Neck:     Thyroid : No thyromegaly.  Cardiovascular:     Rate and Rhythm: Normal rate and regular rhythm.     Heart sounds: Normal heart sounds. No murmur heard. Pulmonary:     Effort: Pulmonary effort is normal. No respiratory distress.     Breath sounds: Normal breath sounds. No wheezing, rhonchi or rales.  Musculoskeletal:     Cervical back: Neck supple.     Right lower leg: No edema.     Left lower leg: No edema.  Lymphadenopathy:  Cervical: No cervical adenopathy.  Skin:    General: Skin is warm and dry.     Findings: No rash.  Neurological:     Mental Status: She is alert and oriented to person, place, and time. Mental status is at baseline.  Psychiatric:        Mood and Affect: Mood normal.        Behavior: Behavior normal.      No results found for any visits on 10/13/24.  Assessment & Plan     Problem List Items Addressed This Visit       Cardiovascular and Mediastinum   Essential hypertension - Primary   Well controlled Continue current medications Reviewed metabolic panel F/u in 6 months       Paroxysmal atrial fibrillation (HCC)    conitnue low dose eliquis  F/b cardiology No rapid rate today Continue metop at current dose        Other   Insomnia   Doing well on Ambien  2.5mg  qhs prn  Takes sparingly Discussed risks of hypersomnolence, falls, confusion in the elderly Will not plan to escalate dose or number of pills given in the future      Elevated LDL cholesterol level   Continue atorvastatin  Recheck lipids      History of transient ischemic attack (TIA)   Other Visit Diagnoses       Acute pain of left shoulder       Relevant Orders   Ambulatory referral to Physical Therapy           Left upper back/trapezius muscle pain Pain in the left upper back, likely involving the trapezius muscle, following an incident while lifting her great-grandchild. Pain is intermittent and has persisted for 2-3 weeks. Previous physical therapy was beneficial. - Refer to physical therapy for trapezius muscle rehabilitation - Advise use of heating pad for muscle pain  Right foot osteoarthritis and bunion Previous symptoms of numbness and pain in the right foot have resolved. X-ray showed arthritic changes and a bunion. Appointment with Dr. Lane was scheduled but may be canceled if symptoms remain resolved. - Advise to cancel appointment with Dr. Lane if symptoms remain resolved       Return in about 6 months (around 04/13/2025) for CPE, AWV.       Jon Eva, MD  [facility] [phone] (phone) [phone] (fax)  [facility]

## 2024-10-13 NOTE — Assessment & Plan Note (Signed)
 Doing well on Ambien  2.5mg  qhs prn  Takes sparingly Discussed risks of hypersomnolence, falls, confusion in the elderly Will not plan to escalate dose or number of pills given in the future

## 2024-10-13 NOTE — Assessment & Plan Note (Signed)
conitnue low dose eliquis F/b cardiology No rapid rate today Continue metop at current dose

## 2024-10-13 NOTE — Assessment & Plan Note (Signed)
Continue atorvastatin Recheck lipids

## 2024-10-18 ENCOUNTER — Other Ambulatory Visit: Payer: Self-pay | Admitting: Family Medicine

## 2024-10-19 DIAGNOSIS — G629 Polyneuropathy, unspecified: Secondary | ICD-10-CM | POA: Diagnosis not present

## 2024-10-19 DIAGNOSIS — R200 Anesthesia of skin: Secondary | ICD-10-CM | POA: Diagnosis not present

## 2024-10-19 DIAGNOSIS — R202 Paresthesia of skin: Secondary | ICD-10-CM | POA: Diagnosis not present

## 2024-10-20 NOTE — Telephone Encounter (Signed)
 Requested Prescriptions  Pending Prescriptions Disp Refills   hydrochlorothiazide  (HYDRODIURIL ) 12.5 MG tablet [Pharmacy Med Name: HYDROCHLOROTHIAZIDE  12.5 MG TB] 90 tablet 0    Sig: TAKE 1 TABLET BY MOUTH EVERY DAY     Cardiovascular: Diuretics - Thiazide Passed - 10/20/2024 12:03 PM      Passed - Cr in normal range and within 180 days    Creatinine, Ser  Date Value Ref Range Status  08/14/2024 0.68 0.44 - 1.00 mg/dL Final         Passed - K in normal range and within 180 days    Potassium  Date Value Ref Range Status  08/14/2024 3.8 3.5 - 5.1 mmol/L Final         Passed - Na in normal range and within 180 days    Sodium  Date Value Ref Range Status  08/14/2024 139 135 - 145 mmol/L Final  04/09/2024 144 134 - 144 mmol/L Final         Passed - Last BP in normal range    BP Readings from Last 1 Encounters:  10/13/24 124/83         Passed - Valid encounter within last 6 months    Recent Outpatient Visits           1 week ago Essential hypertension   [facility] [place], Jon HERO, MD   2 months ago Neuropathy   [facility] [place], Jon HERO, MD   4 months ago Abdominal distension   [facility] [place], Jon HERO, MD   6 months ago Encounter for annual wellness visit (AWV) in Medicare patient   [facility] [place], Jon HERO, MD       Future Appointments             In 3 weeks Cindie Ole DASEN, MD [facility]

## 2024-11-03 ENCOUNTER — Encounter: Payer: Self-pay | Admitting: Family Medicine

## 2024-11-03 ENCOUNTER — Ambulatory Visit (INDEPENDENT_AMBULATORY_CARE_PROVIDER_SITE_OTHER): Admitting: Family Medicine

## 2024-11-03 VITALS — BP 136/79 | HR 64 | Temp 97.7°F | Ht 63.0 in | Wt 123.9 lb

## 2024-11-03 DIAGNOSIS — J029 Acute pharyngitis, unspecified: Secondary | ICD-10-CM | POA: Diagnosis not present

## 2024-11-03 DIAGNOSIS — J019 Acute sinusitis, unspecified: Secondary | ICD-10-CM | POA: Diagnosis not present

## 2024-11-03 DIAGNOSIS — R051 Acute cough: Secondary | ICD-10-CM | POA: Diagnosis not present

## 2024-11-03 MED ORDER — BENZONATATE 200 MG PO CAPS: 200.0000 mg | ORAL_CAPSULE | Freq: Two times a day (BID) | ORAL | 0 refills | Status: AC | PRN

## 2024-11-03 MED ORDER — AZITHROMYCIN 500 MG PO TABS
500.0000 mg | ORAL_TABLET | Freq: Every day | ORAL | 0 refills | Status: AC
Start: 2024-11-03 — End: ?

## 2024-11-03 NOTE — Progress Notes (Signed)
 Acute Office Visit  Introduced to nurse practitioner role and practice setting.  All questions answered.  Discussed provider/patient relationship and expectations.   Subjective:     Patient ID: Alexis Lyons, female    DOB: 03-Mar-1941, 83 y.o.   MRN: [id number]  Chief Complaint  Patient presents with   URI    Dry cough over a week , fatigue, loss voice x2 days ,brain feels foggy     Discussed the use of AI scribe software for clinical note transcription with the patient, who gave verbal consent to proceed.  History of Present Illness Alexis Lyons is an 83 year old female who presents with hoarseness and fatigue.  She has been experiencing hoarseness and fatigue for over a week. Initially, she lost her voice completely on Friday and Saturday, but it returned in a raspy form by Sunday. She describes feeling 'no energy' and her head feeling 'unscrewed.'  She recently visited her sister in [place] , who resides in an assisted living facility. During her visit, she noticed a strong urine smell in the room, which she believes may have irritated her throat or vocal cords. She has been using Flonase at night.  No sore throat that feels like 'a knife' or 'razor blades,' but there is a persistent dry cough and a sensation of pressure in her head. No fever, chills, difficulty breathing, palpitations, shortness of breath, nausea, vomiting, diarrhea, or rash. She has been using Flonase intermittently and took Tylenol  on a couple of days. She has not taken any other over-the-counter medications due to confusion about the options available. She drinks a lot of tea and has tried gargling with salt water and drinking hot tea with honey.  She reports a lot of mucus and drainage in her sinuses, which she suspects might be causing the dry cough. No ear pain or wheezing, and she is able to breathe deeply without obstruction.  HPI  ROS     Objective:    BP 136/79 (BP Location: Left Arm,  Patient Position: Sitting, Cuff Size: Normal)   Pulse 64   Temp 97.7 F (36.5 C)   Ht 5' 3 (1.6 m)   Wt 123 lb 14.4 oz (56.2 kg)   SpO2 99%   BMI 21.95 kg/m    Physical Exam Constitutional:      General: She is not in acute distress.    Appearance: She is well-developed and normal weight. She is not ill-appearing, toxic-appearing or diaphoretic.  HENT:     Head: Normocephalic.     Right Ear: Tympanic membrane, ear canal and external ear normal. No drainage, swelling or tenderness. No middle ear effusion. Tympanic membrane is not erythematous.     Left Ear: Tympanic membrane, ear canal and external ear normal. No drainage, swelling or tenderness.  No middle ear effusion. There is no impacted cerumen. Tympanic membrane is not injected, scarred, perforated, erythematous or bulging.     Ears:     Comments: R TM excessive dry cerumen    Nose: Congestion and rhinorrhea present.     Right Turbinates: Enlarged.     Left Turbinates: Enlarged.     Right Sinus: No maxillary sinus tenderness or frontal sinus tenderness.     Left Sinus: No maxillary sinus tenderness or frontal sinus tenderness.     Mouth/Throat:     Mouth: Mucous membranes are moist. No oral lesions.     Pharynx: Uvula midline. Posterior oropharyngeal erythema and postnasal drip present. No pharyngeal swelling, oropharyngeal  exudate or uvula swelling.     Tonsils: No tonsillar exudate or tonsillar abscesses. 0 on the right. 0 on the left.  Eyes:     General:        Right eye: No discharge.        Left eye: No discharge.     Extraocular Movements:     Right eye: Normal extraocular motion.     Left eye: Normal extraocular motion.     Conjunctiva/sclera: Conjunctivae normal.     Pupils: Pupils are equal, round, and reactive to light.  Cardiovascular:     Rate and Rhythm: Normal rate and regular rhythm.     Pulses: Normal pulses.     Heart sounds: Normal heart sounds. No murmur heard.    No gallop.  Pulmonary:      Effort: Pulmonary effort is normal. No respiratory distress.     Breath sounds: Normal breath sounds. No stridor. No wheezing, rhonchi or rales.  Chest:     Chest wall: No tenderness.  Lymphadenopathy:     Cervical: Cervical adenopathy present.  Skin:    General: Skin is warm and dry.     Capillary Refill: Capillary refill takes less than 2 seconds.  Neurological:     Mental Status: She is alert.  Psychiatric:        Mood and Affect: Mood normal.        Behavior: Behavior normal.        Thought Content: Thought content normal.        Judgment: Judgment normal.     No results found for any visits on 11/03/24.      Assessment & Plan:  Assessment and Plan Assessment & Plan Acute rhino sinusitis with associated acute cough - Symptoms include hoarseness, dry cough, and sinus pressure which initially started a week and half ago.  - No fever, chills, or difficulty breathing.  - Possible initial allergen vs virus in nature. - Erythematous Pharynx and swollen lymph nodes present.   - Improvement in hoarseness noted, but persistent dry cough and sinus pressure remain.  - Consideration of sinus infection due to symptom duration. - Prescribed azithromycin  for coverage for rhinosinusitis - Continue Flonase twice daily. - Use hot tea with honey for symptomatic relief. - gargle with warm salt water for pharyngitis and throat lozenges. - Consider Coricidin for cough, runny nose, and sneezing. - benzonatate for acute cough - Advised rest and hydration. - Instructed to return if symptoms worsen, such as difficulty breathing, increased lethargy, or fever.     Problem List Items Addressed This Visit   None Visit Diagnoses       Acute cough    -  Primary   Relevant Medications   benzonatate (TESSALON) 200 MG capsule     Acute rhinosinusitis       Relevant Medications   benzonatate (TESSALON) 200 MG capsule   azithromycin  (ZITHROMAX ) 500 MG tablet     Pharyngitis, unspecified  etiology           Meds ordered this encounter  Medications   benzonatate (TESSALON) 200 MG capsule    Sig: Take 1 capsule (200 mg total) by mouth 2 (two) times daily as needed for cough.    Dispense:  20 capsule    Refill:  0   azithromycin  (ZITHROMAX ) 500 MG tablet    Sig: Take 1 tablet (500 mg total) by mouth daily.    Dispense:  3 tablet    Refill:  0  Return if symptoms worsen or fail to improve.  Curtis DELENA Boom, FNP  I, Curtis DELENA Boom, FNP, have reviewed all documentation for this visit. The documentation on 11/03/24 for the exam, diagnosis, procedures, and orders are all accurate and complete.

## 2024-11-03 NOTE — Patient Instructions (Addendum)
 May take coricidin OTC for symptom management Start taking aziththromycin Benzonatate for cough

## 2024-11-10 NOTE — Progress Notes (Unsigned)
  Electrophysiology Office Follow up Visit Note:    Date:  11/11/2024   ID:  Alexis Lyons, DOB 05-20-1941, MRN [id number]  PCP:  Alexis Jon HERO, MD  [facility] Cardiologist:  Alexis Cave, MD  [facility] Electrophysiologist:  Alexis ONEIDA HOLTS, MD    Interval History:     Alexis Lyons is a 83 y.o. female who presents for a follow up visit.   The patient last saw [name] October 25, 2023.  She has a history of atrial fibrillation with prior catheter ablation in 2022.  At the appointment with [name] she was doing well.  She takes Eliquis  for stroke prophylaxis.  She is doing well today.  No complaints.      Past medical, surgical, social and family history were reviewed.  ROS:   Please see the history of present illness.    All other systems reviewed and are negative.  EKGs/Labs/Other Studies Reviewed:    The following studies were reviewed today:     EKG Interpretation Date/Time:  Wednesday November 11 2024 13:33:45 EST Ventricular Rate:  53 PR Interval:  172 QRS Duration:  76 QT Interval:  444 QTC Calculation: 416 R Axis:   3  Text Interpretation: Sinus bradycardia Confirmed by Alexis Lyons [phone]) on 11/11/2024 1:35:00 PM    Physical Exam:    VS:  BP 122/72 (BP Location: Left Arm, Patient Position: Sitting, Cuff Size: Normal)   Pulse (!) 53   Ht 5' 3 (1.6 m)   Wt 123 lb 6.4 oz (56 kg)   SpO2 97%   BMI 21.86 kg/m     Wt Readings from Last 3 Encounters:  11/11/24 123 lb 6.4 oz (56 kg)  11/03/24 123 lb 14.4 oz (56.2 kg)  10/13/24 122 lb 1.6 oz (55.4 kg)     GEN: no distress CARD: RRR, No MRG RESP: No IWOB. CTAB.      ASSESSMENT:    1. Paroxysmal atrial fibrillation (HCC)   2. SVT (supraventricular tachycardia)   3. Primary hypertension    PLAN:    In order of problems listed above:  #Paroxysmal atrial fibrillation No recent palpitations Continue metoprolol  and Eliquis , dosed  appropriately  #Hypertension At goal today.  Recommend checking blood pressures 1-2 times per week at home and recording the values.  Recommend bringing these recordings to the primary care physician.  I discussed my upcoming departure from [facility] during today's clinic appointment.  The patient will continue to follow-up with one of my EP partners moving forward.  Follow-up with EP APP in 1 year   Signed, Alexis Holts, MD, [facility], [facility] 11/11/2024 1:41 PM    Electrophysiology [facility]

## 2024-11-11 ENCOUNTER — Encounter: Payer: Self-pay | Admitting: Cardiology

## 2024-11-11 ENCOUNTER — Ambulatory Visit: Attending: Cardiology | Admitting: Cardiology

## 2024-11-11 VITALS — BP 122/72 | HR 53 | Ht 63.0 in | Wt 123.4 lb

## 2024-11-11 DIAGNOSIS — I10 Essential (primary) hypertension: Secondary | ICD-10-CM

## 2024-11-11 DIAGNOSIS — I471 Supraventricular tachycardia, unspecified: Secondary | ICD-10-CM

## 2024-11-11 DIAGNOSIS — I480 Paroxysmal atrial fibrillation: Secondary | ICD-10-CM

## 2024-11-11 NOTE — Patient Instructions (Signed)

## 2024-11-23 DIAGNOSIS — H0288A Meibomian gland dysfunction right eye, upper and lower eyelids: Secondary | ICD-10-CM | POA: Diagnosis not present

## 2024-11-23 DIAGNOSIS — H04129 Dry eye syndrome of unspecified lacrimal gland: Secondary | ICD-10-CM | POA: Diagnosis not present

## 2024-11-23 DIAGNOSIS — H0288B Meibomian gland dysfunction left eye, upper and lower eyelids: Secondary | ICD-10-CM | POA: Diagnosis not present

## 2024-11-23 DIAGNOSIS — H35363 Drusen (degenerative) of macula, bilateral: Secondary | ICD-10-CM | POA: Diagnosis not present

## 2025-01-04 ENCOUNTER — Other Ambulatory Visit: Payer: Self-pay | Admitting: Family Medicine

## 2025-01-12 ENCOUNTER — Ambulatory Visit: Attending: Cardiology | Admitting: Cardiology

## 2025-01-12 VITALS — BP 136/80 | HR 60 | Ht 63.0 in | Wt 125.6 lb

## 2025-01-12 DIAGNOSIS — I10 Essential (primary) hypertension: Secondary | ICD-10-CM | POA: Diagnosis not present

## 2025-01-12 DIAGNOSIS — I480 Paroxysmal atrial fibrillation: Secondary | ICD-10-CM

## 2025-01-12 DIAGNOSIS — I2510 Atherosclerotic heart disease of native coronary artery without angina pectoris: Secondary | ICD-10-CM | POA: Diagnosis not present

## 2025-01-12 NOTE — Patient Instructions (Addendum)

## 2025-01-12 NOTE — Progress Notes (Signed)
 " Cardiology Office Note:    Date:  01/12/2025   ID:  Alexis Lyons, DOB 16-Jan-1941, MRN [id number]  PCP:  Myrla Jon HERO, MD   [facility]  Cardiologist:  Redell Cave, MD  Advanced Practice Provider:  No care team member to display Electrophysiologist:  Fonda Kitty, MD       Referring MD: Myrla Jon HERO, MD   Chief Complaint  Patient presents with   Follow-up    Doing okay since last visit    History of Present Illness:    Alexis Lyons is a 84 y.o. female with a hx of paroxysmal atrial fibrillation s/p RFA 10/2021, hypertension, who presents for follow-up.  Being seen for paroxysmal atrial fibrillation, has felt well since her ablation procedure.  Denies palpitations.  Compliant with medications as prescribed.  Denies any bleeding issues with taking Eliquis .  BP adequately controlled.  Feels well, has no concerns at this time.   Prior notes Echocardiogram 04/2021 EF 60 to 65%, impaired relaxation. Cardiac monitor 08/2022 paroxysmal SVT.  No A-fib.   Past Medical History:  Diagnosis Date   Amnesia    Aphthae    Atrial fibrillation (HCC)    Congestion-fibrosis syndrome    COVID 12/12/2022   DD (diverticular disease)    GERD (gastroesophageal reflux disease)    Seizures (HCC) 12/2017   passed out due to dehydration   TIA (transient ischemic attack) 10/2023    Past Surgical History:  Procedure Laterality Date   ATRIAL FIBRILLATION ABLATION N/A 11/02/2021   Procedure: ATRIAL FIBRILLATION ABLATION;  Surgeon: Cindie Ole DASEN, MD;  Location: [facility] INVASIVE CV LAB;  Service: Cardiovascular;  Laterality: N/A;   CHOLECYSTECTOMY     colonoscopy with polypectomy     8/11 4 mm polyp, 12 mm polyp   COLONOSCOPY WITH PROPOFOL  N/A 02/24/2018   Procedure: COLONOSCOPY WITH PROPOFOL ;  Surgeon: Viktoria Lamar DASEN, MD;  Location[facility] ENDOSCOPY;  Service: Endoscopy;  Laterality: N/A;   HERNIA REPAIR Right 2004   inguinal    HERNIA  REPAIR     x's 3    Current Medications: Current Meds  Medication Sig   atorvastatin  (LIPITOR) 40 MG tablet TAKE 1 TABLET BY MOUTH EVERY DAY   conjugated estrogens  (PREMARIN ) vaginal cream Place 1 Applicatorful vaginally 2 (two) times a week.   Cyanocobalamin  (B-12 PO) Take by mouth daily.   ELIQUIS  2.5 MG TABS tablet TAKE 1 TABLET BY MOUTH TWICE A DAY   hydrochlorothiazide  (HYDRODIURIL ) 12.5 MG tablet TAKE 1 TABLET BY MOUTH EVERY DAY   metoprolol  succinate (TOPROL  XL) 25 MG 24 hr tablet Take 0.5 tablets (12.5 mg total) by mouth daily.   zolpidem  (AMBIEN ) 5 MG tablet Take 0.5 tablets (2.5 mg total) by mouth at bedtime as needed for sleep.     Allergies:   Codeine and Amlodipine    Social History   Socioeconomic History   Marital status: Divorced    Spouse name: Not on file   Number of children: 2   Years of education: 15   Highest education level: Not on file  Occupational History   Occupation: [employer] in [place]  Tobacco Use   Smoking status: Former    Current packs/day: 0.00    Average packs/day: 0.5 packs/day for 15.0 years (7.5 ttl pk-yrs)    Types: Cigarettes    Start date: 12/16/1970    Quit date: 12/16/1985    Years since quitting: 39.1   Smokeless tobacco: Never  Vaping Use  Vaping status: Never Used  Substance and Sexual Activity   Alcohol use: Yes    Alcohol/week: 7.0 standard drinks of alcohol    Types: 7 Glasses of wine per week    Comment: glass of wine with dinner   Drug use: No   Sexual activity: Not Currently  Other Topics Concern   Not on file  Social History Narrative   Not on file   Social Drivers of Health   Tobacco Use: Medium Risk (11/11/2024)   Patient History    Smoking Tobacco Use: Former    Smokeless Tobacco Use: Never    Passive Exposure: Not on file  Financial Resource Strain: Low Risk (04/09/2024)   Overall Financial Resource Strain (CARDIA)    Difficulty of Paying Living Expenses: Not hard at all  Food Insecurity: No Food  Insecurity (04/09/2024)   Hunger Vital Sign    Worried About Running Out of Food in the Last Year: Never true    Ran Out of Food in the Last Year: Never true  Transportation Needs: No Transportation Needs (04/09/2024)   PRAPARE - Administrator, Civil Service (Medical): No    Lack of Transportation (Non-Medical): No  Physical Activity: Insufficiently Active (04/09/2024)   Exercise Vital Sign    Days of Exercise per Week: 4 days    Minutes of Exercise per Session: 30 min  Stress: No Stress Concern Present (04/09/2024)   [employer] of Occupational Health - Occupational Stress Questionnaire    Feeling of Stress : Not at all  Social Connections: Moderately Isolated (04/09/2024)   Social Connection and Isolation Panel    Frequency of Communication with Friends and Family: More than three times a week    Frequency of Social Gatherings with Friends and Family: Once a week    Attends Religious Services: More than 4 times per year    Active Member of Clubs or Organizations: No    Attends Banker Meetings: Never    Marital Status: Divorced  Depression (PHQ2-9): Low Risk (10/13/2024)   Depression (PHQ2-9)    PHQ-2 Score: 0  Alcohol Screen: Low Risk (04/09/2024)   Alcohol Screen    Last Alcohol Screening Score (AUDIT): 3  Housing: Unknown (10/19/2024)   Received from [facility]   Epic    Unable to Pay for Housing in the Last Year: Not on file    Number of Times Moved in the Last Year: Not on file    At any time in the past 12 months, were you homeless or living in a shelter (including now)?: No  Utilities: Not At Risk (04/09/2024)   [facility] Utilities    Threatened with loss of utilities: No  Health Literacy: Adequate Health Literacy (04/09/2024)   B1300 Health Literacy    Frequency of need for help with medical instructions: Never     Family History: The patient's family history includes Alcohol abuse in her father; Alzheimer's disease in her  mother; Breast cancer in her sister; Cancer in her father and sister.  ROS:   Please see the history of present illness.     All other systems reviewed and are negative.  EKGs/Labs/Other Studies Reviewed:    The following studies were reviewed today:   Recent Labs: 08/12/2024: TSH 2.240 08/14/2024: ALT 29; BUN 16; Creatinine, Ser 0.68; Hemoglobin 14.1; Platelets 215; Potassium 3.8; Sodium 139  Recent Lipid Panel    Component Value Date/Time   CHOL 106 04/09/2024 1124   TRIG 87 04/09/2024  1124   HDL 56 04/09/2024 1124   CHOLHDL 1.9 04/09/2024 1124   CHOLHDL 3.0 11/09/2023 0354   VLDL 13 11/09/2023 0354   LDLCALC 33 04/09/2024 1124     Risk Assessment/Calculations:      Physical Exam:    VS:  BP 136/80 (BP Location: Right Arm, Patient Position: Sitting, Cuff Size: Normal)   Pulse 60   Ht 5' 3 (1.6 m)   Wt 125 lb 9.6 oz (57 kg)   SpO2 97%   BMI 22.25 kg/m     Wt Readings from Last 3 Encounters:  01/12/25 125 lb 9.6 oz (57 kg)  11/11/24 123 lb 6.4 oz (56 kg)  11/03/24 123 lb 14.4 oz (56.2 kg)     GEN:  Well nourished, well developed in no acute distress HEENT: Normal NECK: No JVD; No carotid bruits CARDIAC: RRR, no murmurs, rubs, gallops RESPIRATORY:  Clear to auscultation without rales, wheezing or rhonchi  ABDOMEN: Soft, non-tender, non-distended MUSCULOSKELETAL:  No edema; No deformity  SKIN: Warm and dry NEUROLOGIC:  Alert and oriented x 3 PSYCHIATRIC:  Normal affect   ASSESSMENT:    1. Paroxysmal atrial fibrillation (HCC)   2. Primary hypertension   3. Coronary artery calcification    PLAN:    In order of problems listed above:  paroxysmal atrial fibrillation, s/p ablation 10/2021.  CHA2DS2-VASc of 4 (age, htn, gender).  Rhythm appears regular on exam.  Continue Eliquis  2.5 mg twice daily, Toprol -XL 12.5 mg daily. Hypertension, BP controlled. Continue Toprol -XL 12.5 mg daily, HCTZ 12.5 mg daily.  Minimal proximal LAD calcification less than 25%,  calcium  score 9.38 on CCTA 3/24.  Echo 11/24 EF 60 to 65% continue Eliquis , Lipitor 40 mg daily.  Follow-up in 1 year.   Medication Adjustments/Labs and Tests Ordered: Current medicines are reviewed at length with the patient today.  Concerns regarding medicines are outlined above.  No orders of the defined types were placed in this encounter.   No orders of the defined types were placed in this encounter.    Patient Instructions        Signed, Redell Cave, MD  01/12/2025 12:17 PM    [facility] "

## 2025-01-15 ENCOUNTER — Other Ambulatory Visit: Payer: Self-pay | Admitting: Family Medicine

## 2025-04-15 ENCOUNTER — Encounter: Admitting: Family Medicine
